# Patient Record
Sex: Male | Born: 1950
Health system: Southern US, Community
[De-identification: ages and names within clinical notes are randomized; demographics above are authoritative.]

## PROBLEM LIST (undated history)

## (undated) DIAGNOSIS — Z978 Presence of other specified devices: Secondary | ICD-10-CM

## (undated) DIAGNOSIS — I10 Essential (primary) hypertension: Secondary | ICD-10-CM

## (undated) DIAGNOSIS — I639 Cerebral infarction, unspecified: Secondary | ICD-10-CM

## (undated) DIAGNOSIS — R339 Retention of urine, unspecified: Secondary | ICD-10-CM

## (undated) DIAGNOSIS — I472 Ventricular tachycardia: Secondary | ICD-10-CM

## (undated) DIAGNOSIS — F32A Depression, unspecified: Secondary | ICD-10-CM

## (undated) DIAGNOSIS — R630 Anorexia: Secondary | ICD-10-CM

## (undated) DIAGNOSIS — I77810 Thoracic aortic ectasia: Secondary | ICD-10-CM

## (undated) DIAGNOSIS — N184 Chronic kidney disease, stage 4 (severe): Secondary | ICD-10-CM

## (undated) DIAGNOSIS — I499 Cardiac arrhythmia, unspecified: Secondary | ICD-10-CM

## (undated) DIAGNOSIS — C61 Malignant neoplasm of prostate: Secondary | ICD-10-CM

## (undated) DIAGNOSIS — Z8739 Personal history of other diseases of the musculoskeletal system and connective tissue: Secondary | ICD-10-CM

## (undated) DIAGNOSIS — B1920 Unspecified viral hepatitis C without hepatic coma: Secondary | ICD-10-CM

## (undated) DIAGNOSIS — R001 Bradycardia, unspecified: Secondary | ICD-10-CM

## (undated) DIAGNOSIS — Z960 Presence of urogenital implants: Secondary | ICD-10-CM

## (undated) DIAGNOSIS — I491 Atrial premature depolarization: Secondary | ICD-10-CM

## (undated) DIAGNOSIS — I119 Hypertensive heart disease without heart failure: Secondary | ICD-10-CM

## (undated) HISTORY — DX: Ventricular tachycardia: I47.2

## (undated) HISTORY — DX: Unspecified viral hepatitis C without hepatic coma: B19.20

## (undated) HISTORY — PX: ACHILLES TENDON REPAIR: SUR1153

## (undated) HISTORY — DX: Atrial premature depolarization: I49.1

## (undated) HISTORY — DX: Cerebral infarction, unspecified: I63.9

## (undated) HISTORY — DX: Chronic kidney disease, stage 4 (severe): N18.4

## (undated) HISTORY — DX: Essential (primary) hypertension: I10

## (undated) HISTORY — PX: CERVICAL DISC SURGERY: SHX588

## (undated) HISTORY — PX: COLONOSCOPY: SHX174

## (undated) HISTORY — DX: Hypertensive heart disease without heart failure: I11.9

## (undated) HISTORY — PX: NOSE SURGERY: SHX723

---
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
## 1995-11-27 DIAGNOSIS — C61 Malignant neoplasm of prostate: Secondary | ICD-10-CM

## 1995-11-27 HISTORY — DX: Malignant neoplasm of prostate: C61

## 2005-11-05 ENCOUNTER — Emergency Department (HOSPITAL_COMMUNITY): Admission: EM | Admit: 2005-11-05 | Discharge: 2005-11-06 | Payer: Self-pay | Admitting: Emergency Medicine

## 2006-06-09 ENCOUNTER — Emergency Department (HOSPITAL_COMMUNITY): Admission: EM | Admit: 2006-06-09 | Discharge: 2006-06-09 | Payer: Self-pay | Admitting: Emergency Medicine

## 2006-07-21 ENCOUNTER — Emergency Department (HOSPITAL_COMMUNITY): Admission: EM | Admit: 2006-07-21 | Discharge: 2006-07-21 | Payer: Self-pay | Admitting: Family Medicine

## 2006-08-02 ENCOUNTER — Emergency Department (HOSPITAL_COMMUNITY): Admission: EM | Admit: 2006-08-02 | Discharge: 2006-08-03 | Payer: Self-pay | Admitting: Emergency Medicine

## 2006-08-03 ENCOUNTER — Encounter (INDEPENDENT_AMBULATORY_CARE_PROVIDER_SITE_OTHER): Payer: Self-pay | Admitting: *Deleted

## 2006-08-03 ENCOUNTER — Ambulatory Visit (HOSPITAL_COMMUNITY): Admission: RE | Admit: 2006-08-03 | Discharge: 2006-08-03 | Payer: Self-pay | Admitting: Emergency Medicine

## 2006-09-19 ENCOUNTER — Emergency Department (HOSPITAL_COMMUNITY): Admission: EM | Admit: 2006-09-19 | Discharge: 2006-09-19 | Payer: Self-pay | Admitting: Emergency Medicine

## 2006-11-15 ENCOUNTER — Ambulatory Visit: Payer: Self-pay | Admitting: Nurse Practitioner

## 2006-11-21 ENCOUNTER — Ambulatory Visit: Payer: Self-pay | Admitting: Internal Medicine

## 2006-11-27 ENCOUNTER — Ambulatory Visit: Payer: Self-pay | Admitting: *Deleted

## 2006-11-28 ENCOUNTER — Ambulatory Visit: Payer: Self-pay | Admitting: Nurse Practitioner

## 2006-12-13 ENCOUNTER — Ambulatory Visit: Payer: Self-pay | Admitting: Nurse Practitioner

## 2006-12-27 ENCOUNTER — Ambulatory Visit: Payer: Self-pay | Admitting: Family Medicine

## 2007-01-13 ENCOUNTER — Ambulatory Visit: Payer: Self-pay | Admitting: Nurse Practitioner

## 2007-08-13 ENCOUNTER — Encounter (INDEPENDENT_AMBULATORY_CARE_PROVIDER_SITE_OTHER): Payer: Self-pay | Admitting: *Deleted

## 2007-08-21 ENCOUNTER — Ambulatory Visit: Payer: Self-pay | Admitting: Internal Medicine

## 2007-12-10 ENCOUNTER — Encounter (INDEPENDENT_AMBULATORY_CARE_PROVIDER_SITE_OTHER): Payer: Self-pay | Admitting: Nurse Practitioner

## 2007-12-10 ENCOUNTER — Ambulatory Visit: Payer: Self-pay | Admitting: Family Medicine

## 2007-12-10 LAB — CONVERTED CEMR LAB
AST: 62 units/L — ABNORMAL HIGH (ref 0–37)
Albumin: 4.2 g/dL (ref 3.5–5.2)
Alkaline Phosphatase: 61 units/L (ref 39–117)
BUN: 22 mg/dL (ref 6–23)
Basophils Relative: 0 % (ref 0–1)
Eosinophils Absolute: 0.1 10*3/uL (ref 0.0–0.7)
Eosinophils Relative: 1 % (ref 0–5)
MCHC: 34.2 g/dL (ref 30.0–36.0)
MCV: 90.3 fL (ref 78.0–100.0)
Monocytes Relative: 7 % (ref 3–12)
Neutrophils Relative %: 67 % (ref 43–77)
Platelets: 183 10*3/uL (ref 150–400)
Potassium: 3.6 meq/L (ref 3.5–5.3)
RBC: 4.95 M/uL (ref 4.22–5.81)
Total Bilirubin: 0.5 mg/dL (ref 0.3–1.2)

## 2007-12-22 ENCOUNTER — Ambulatory Visit: Payer: Self-pay | Admitting: Family Medicine

## 2008-01-05 ENCOUNTER — Ambulatory Visit: Payer: Self-pay | Admitting: Internal Medicine

## 2008-02-19 ENCOUNTER — Ambulatory Visit (HOSPITAL_COMMUNITY): Admission: RE | Admit: 2008-02-19 | Discharge: 2008-02-19 | Payer: Self-pay | Admitting: Urology

## 2008-07-30 ENCOUNTER — Emergency Department (HOSPITAL_COMMUNITY): Admission: EM | Admit: 2008-07-30 | Discharge: 2008-07-30 | Payer: Self-pay | Admitting: Family Medicine

## 2008-08-09 ENCOUNTER — Ambulatory Visit: Payer: Self-pay | Admitting: Internal Medicine

## 2008-08-21 ENCOUNTER — Emergency Department (HOSPITAL_COMMUNITY): Admission: EM | Admit: 2008-08-21 | Discharge: 2008-08-22 | Payer: Self-pay | Admitting: *Deleted

## 2008-08-23 ENCOUNTER — Emergency Department (HOSPITAL_COMMUNITY): Admission: EM | Admit: 2008-08-23 | Discharge: 2008-08-23 | Payer: Self-pay | Admitting: Family Medicine

## 2008-09-06 ENCOUNTER — Ambulatory Visit: Payer: Self-pay | Admitting: Internal Medicine

## 2008-09-06 LAB — CONVERTED CEMR LAB
AST: 74 units/L — ABNORMAL HIGH (ref 0–37)
Alkaline Phosphatase: 59 units/L (ref 39–117)
BUN: 20 mg/dL (ref 6–23)
Basophils Relative: 0 % (ref 0–1)
Creatinine, Ser: 1.31 mg/dL (ref 0.40–1.50)
Eosinophils Absolute: 0.1 10*3/uL (ref 0.0–0.7)
Eosinophils Relative: 1 % (ref 0–5)
Glucose, Bld: 86 mg/dL (ref 70–99)
HCT: 41.5 % (ref 39.0–52.0)
HDL: 54 mg/dL (ref >39)
Hemoglobin: 13.8 g/dL (ref 13.0–17.0)
LDL Cholesterol: 130 mg/dL — ABNORMAL HIGH (ref 0–99)
Lymphs Abs: 1.5 10*3/uL (ref 0.7–4.0)
MCHC: 33.3 g/dL (ref 30.0–36.0)
MCV: 91.4 fL (ref 78.0–100.0)
Monocytes Absolute: 0.7 10*3/uL (ref 0.1–1.0)
Monocytes Relative: 9 % (ref 3–12)
Neutrophils Relative %: 67 % (ref 43–77)
PSA: 14.21 ng/mL — ABNORMAL HIGH (ref 0.10–4.00)
RBC: 4.54 M/uL (ref 4.22–5.81)
Total Bilirubin: 0.6 mg/dL (ref 0.3–1.2)
Total CHOL/HDL Ratio: 3.6
Triglycerides: 60 mg/dL (ref <150)

## 2009-03-21 ENCOUNTER — Ambulatory Visit: Payer: Self-pay | Admitting: Family Medicine

## 2009-07-20 ENCOUNTER — Ambulatory Visit: Payer: Self-pay | Admitting: Internal Medicine

## 2009-07-20 ENCOUNTER — Encounter (INDEPENDENT_AMBULATORY_CARE_PROVIDER_SITE_OTHER): Payer: Self-pay | Admitting: Adult Health

## 2009-07-20 LAB — CONVERTED CEMR LAB
BUN: 30 mg/dL — ABNORMAL HIGH (ref 6–23)
Basophils Relative: 0 % (ref 0–1)
CO2: 29 meq/L (ref 19–32)
Calcium: 9.4 mg/dL (ref 8.4–10.5)
Chloride: 99 meq/L (ref 96–112)
Creatinine, Ser: 1.62 mg/dL — ABNORMAL HIGH (ref 0.40–1.50)
Eosinophils Absolute: 0.1 10*3/uL (ref 0.0–0.7)
Eosinophils Relative: 2 % (ref 0–5)
HCT: 44.1 % (ref 39.0–52.0)
Lymphs Abs: 2.1 10*3/uL (ref 0.7–4.0)
MCHC: 34 g/dL (ref 30.0–36.0)
MCV: 90 fL (ref 78.0–100.0)
Monocytes Relative: 12 % (ref 3–12)
Neutrophils Relative %: 56 % (ref 43–77)
Platelets: 179 10*3/uL (ref 150–400)
RBC: 4.9 M/uL (ref 4.22–5.81)
Total Bilirubin: 0.8 mg/dL (ref 0.3–1.2)
WBC: 6.9 10*3/uL (ref 4.0–10.5)

## 2009-08-23 ENCOUNTER — Ambulatory Visit: Payer: Self-pay | Admitting: Internal Medicine

## 2009-09-14 ENCOUNTER — Ambulatory Visit: Payer: Self-pay | Admitting: Family Medicine

## 2009-11-29 ENCOUNTER — Ambulatory Visit: Payer: Self-pay | Admitting: Internal Medicine

## 2009-11-29 ENCOUNTER — Encounter (INDEPENDENT_AMBULATORY_CARE_PROVIDER_SITE_OTHER): Payer: Self-pay | Admitting: Adult Health

## 2009-11-29 LAB — CONVERTED CEMR LAB
ALT: 67 units/L — ABNORMAL HIGH (ref 0–53)
Albumin: 4.1 g/dL (ref 3.5–5.2)
CO2: 27 meq/L (ref 19–32)
Calcium: 9.5 mg/dL (ref 8.4–10.5)
Chloride: 99 meq/L (ref 96–112)
Cholesterol: 179 mg/dL (ref 0–200)
Glucose, Bld: 101 mg/dL — ABNORMAL HIGH (ref 70–99)
Potassium: 3.5 meq/L (ref 3.5–5.3)
Pro B Natriuretic peptide (BNP): 8.2 pg/mL (ref 0.0–100.0)
Sodium: 137 meq/L (ref 135–145)
Total Protein: 8 g/dL (ref 6.0–8.3)
Triglycerides: 51 mg/dL (ref <150)
VLDL: 10 mg/dL (ref 0–40)

## 2009-12-01 ENCOUNTER — Encounter (INDEPENDENT_AMBULATORY_CARE_PROVIDER_SITE_OTHER): Payer: Self-pay | Admitting: Adult Health

## 2009-12-01 LAB — CONVERTED CEMR LAB
Hep A IgM: NEGATIVE
Hep B C IgM: NEGATIVE
Hepatitis B Surface Ag: NEGATIVE

## 2010-05-24 ENCOUNTER — Encounter (INDEPENDENT_AMBULATORY_CARE_PROVIDER_SITE_OTHER): Payer: Self-pay | Admitting: Adult Health

## 2010-05-24 ENCOUNTER — Ambulatory Visit: Payer: Self-pay | Admitting: Family Medicine

## 2010-05-24 LAB — CONVERTED CEMR LAB
ALT: 67 units/L — ABNORMAL HIGH (ref 0–53)
AST: 76 units/L — ABNORMAL HIGH (ref 0–37)
Albumin: 4.4 g/dL (ref 3.5–5.2)
Barbiturate Quant, Ur: NEGATIVE
Basophils Relative: 1 % (ref 0–1)
CO2: 28 meq/L (ref 19–32)
Calcium: 9.9 mg/dL (ref 8.4–10.5)
Chloride: 101 meq/L (ref 96–112)
Eosinophils Relative: 2 % (ref 0–5)
HCT: 44.4 % (ref 39.0–52.0)
Hemoglobin: 14.2 g/dL (ref 13.0–17.0)
Hep A Total Ab: NEGATIVE
INR: 1.13 (ref <1.50)
MCHC: 32 g/dL (ref 30.0–36.0)
Marijuana Metabolite: NEGATIVE
Methadone: NEGATIVE
Monocytes Absolute: 0.7 10*3/uL (ref 0.1–1.0)
Monocytes Relative: 10 % (ref 3–12)
Neutro Abs: 4.3 10*3/uL (ref 1.7–7.7)
Opiate Screen, Urine: NEGATIVE
Potassium: 4 meq/L (ref 3.5–5.3)
Propoxyphene: NEGATIVE
Prothrombin Time: 14.4 s (ref 11.6–15.2)
RBC: 4.69 M/uL (ref 4.22–5.81)

## 2010-05-30 ENCOUNTER — Ambulatory Visit (HOSPITAL_COMMUNITY): Admission: RE | Admit: 2010-05-30 | Discharge: 2010-05-30 | Payer: Self-pay | Admitting: Internal Medicine

## 2010-07-24 ENCOUNTER — Ambulatory Visit: Payer: Self-pay | Admitting: Internal Medicine

## 2011-04-05 ENCOUNTER — Emergency Department (HOSPITAL_COMMUNITY): Payer: PRIVATE HEALTH INSURANCE

## 2011-04-05 ENCOUNTER — Emergency Department (HOSPITAL_COMMUNITY)
Admission: EM | Admit: 2011-04-05 | Discharge: 2011-04-05 | Disposition: A | Discharge status: Home / Self Care | Payer: PRIVATE HEALTH INSURANCE | Attending: Emergency Medicine | Admitting: Emergency Medicine

## 2011-04-05 DIAGNOSIS — Z8546 Personal history of malignant neoplasm of prostate: Secondary | ICD-10-CM | POA: Insufficient documentation

## 2011-04-05 DIAGNOSIS — N509 Disorder of male genital organs, unspecified: Secondary | ICD-10-CM | POA: Insufficient documentation

## 2011-04-05 DIAGNOSIS — N433 Hydrocele, unspecified: Secondary | ICD-10-CM | POA: Insufficient documentation

## 2011-04-05 DIAGNOSIS — I10 Essential (primary) hypertension: Secondary | ICD-10-CM | POA: Insufficient documentation

## 2011-04-05 LAB — URINALYSIS, ROUTINE W REFLEX MICROSCOPIC
Bilirubin Urine: NEGATIVE
Glucose, UA: NEGATIVE mg/dL
Hgb urine dipstick: NEGATIVE
Ketones, ur: NEGATIVE mg/dL
pH: 7 (ref 5.0–8.0)

## 2011-06-26 ENCOUNTER — Encounter (INDEPENDENT_AMBULATORY_CARE_PROVIDER_SITE_OTHER): Payer: Self-pay | Admitting: General Surgery

## 2011-07-02 ENCOUNTER — Inpatient Hospital Stay (HOSPITAL_COMMUNITY)
Admission: EM | Admit: 2011-07-02 | Discharge: 2011-07-04 | DRG: 287 | Disposition: A | Discharge status: Home / Self Care | Payer: Managed Care, Other (non HMO) | Source: Ambulatory Visit | Attending: Cardiovascular Disease | Admitting: Cardiovascular Disease

## 2011-07-02 ENCOUNTER — Emergency Department (HOSPITAL_COMMUNITY)
Admission: EM | Admit: 2011-07-02 | Discharge: 2011-07-02 | Disposition: A | Discharge status: Other Acute Inpatient Hospital | Payer: Managed Care, Other (non HMO) | Source: Home / Self Care | Attending: Emergency Medicine | Admitting: Emergency Medicine

## 2011-07-02 ENCOUNTER — Emergency Department (HOSPITAL_COMMUNITY): Payer: Managed Care, Other (non HMO)

## 2011-07-02 DIAGNOSIS — Z8546 Personal history of malignant neoplasm of prostate: Secondary | ICD-10-CM

## 2011-07-02 DIAGNOSIS — I2510 Atherosclerotic heart disease of native coronary artery without angina pectoris: Secondary | ICD-10-CM | POA: Diagnosis present

## 2011-07-02 DIAGNOSIS — R0789 Other chest pain: Principal | ICD-10-CM | POA: Diagnosis present

## 2011-07-02 DIAGNOSIS — I10 Essential (primary) hypertension: Secondary | ICD-10-CM | POA: Diagnosis present

## 2011-07-02 LAB — CK TOTAL AND CKMB (NOT AT ARMC)
CK, MB: 1.9 ng/mL (ref 0.3–4.0)
Relative Index: 1.2 (ref 0.0–2.5)
Total CK: 155 U/L (ref 7–232)

## 2011-07-02 LAB — DIFFERENTIAL
Eosinophils Relative: 0 % (ref 0–5)
Lymphocytes Relative: 5 % — ABNORMAL LOW (ref 12–46)
Lymphs Abs: 0.6 10*3/uL — ABNORMAL LOW (ref 0.7–4.0)
Monocytes Absolute: 0.7 10*3/uL (ref 0.1–1.0)

## 2011-07-02 LAB — COMPREHENSIVE METABOLIC PANEL
ALT: 54 U/L — ABNORMAL HIGH (ref 0–53)
AST: 61 U/L — ABNORMAL HIGH (ref 0–37)
Albumin: 3.6 g/dL (ref 3.5–5.2)
Alkaline Phosphatase: 65 U/L (ref 39–117)
BUN: 21 mg/dL (ref 6–23)
CO2: 29 mEq/L (ref 19–32)
Calcium: 9.6 mg/dL (ref 8.4–10.5)
Chloride: 98 mEq/L (ref 96–112)
Creatinine, Ser: 1.2 mg/dL (ref 0.50–1.35)
GFR calc Af Amer: >60 mL/min (ref >60)
GFR calc non Af Amer: >60 mL/min (ref >60)
Glucose, Bld: 147 mg/dL — ABNORMAL HIGH (ref 70–99)
Potassium: 2.8 mEq/L — ABNORMAL LOW (ref 3.5–5.1)
Sodium: 136 mEq/L (ref 135–145)
Total Bilirubin: 0.5 mg/dL (ref 0.3–1.2)
Total Protein: 8.5 g/dL — ABNORMAL HIGH (ref 6.0–8.3)

## 2011-07-02 LAB — CBC
HCT: 40.2 % (ref 39.0–52.0)
MCV: 87.6 fL (ref 78.0–100.0)
RDW: 12.7 % (ref 11.5–15.5)
WBC: 11.1 10*3/uL — ABNORMAL HIGH (ref 4.0–10.5)

## 2011-07-02 LAB — CARDIAC PANEL(CRET KIN+CKTOT+MB+TROPI)
CK, MB: 2.2 ng/mL (ref 0.3–4.0)
Relative Index: 1.8 (ref 0.0–2.5)
Total CK: 123 U/L (ref 7–232)
Troponin I: <0.3 ng/mL (ref <0.30)

## 2011-07-02 LAB — PROTIME-INR: INR: 1.02 (ref 0.00–1.49)

## 2011-07-02 LAB — POCT ACTIVATED CLOTTING TIME: Activated Clotting Time: 155 seconds

## 2011-07-02 LAB — LIPASE, BLOOD: Lipase: 55 U/L (ref 11–59)

## 2011-07-03 LAB — CBC
HCT: 42.7 % (ref 39.0–52.0)
MCHC: 34.9 g/dL (ref 30.0–36.0)
Platelets: 162 10*3/uL (ref 150–400)
RDW: 12.9 % (ref 11.5–15.5)

## 2011-07-03 LAB — BASIC METABOLIC PANEL
BUN: 12 mg/dL (ref 6–23)
Creatinine, Ser: 1.07 mg/dL (ref 0.50–1.35)
GFR calc Af Amer: >60 mL/min (ref >60)
GFR calc non Af Amer: >60 mL/min (ref >60)
Potassium: 2.8 mEq/L — ABNORMAL LOW (ref 3.5–5.1)

## 2011-07-03 LAB — LIPID PANEL
Cholesterol: 206 mg/dL — ABNORMAL HIGH (ref 0–200)
HDL: 68 mg/dL (ref >39)

## 2011-07-04 LAB — CBC
HCT: 42.8 % (ref 39.0–52.0)
MCV: 88.2 fL (ref 78.0–100.0)
RBC: 4.85 MIL/uL (ref 4.22–5.81)
WBC: 8 10*3/uL (ref 4.0–10.5)

## 2011-07-04 LAB — BASIC METABOLIC PANEL
BUN: 17 mg/dL (ref 6–23)
CO2: 27 mEq/L (ref 19–32)
Chloride: 103 mEq/L (ref 96–112)
Creatinine, Ser: 1.16 mg/dL (ref 0.50–1.35)

## 2011-07-05 NOTE — Discharge Summary (Signed)
  NAMELAUREN, Casey Greene NO.:  [id number]  MEDICAL RECORD NO.:  [id number]  LOCATION:  [id number]                         FACILITY:  [facility]  PHYSICIAN:  Birdie Riddle, M.D.  DATE OF BIRTH:  1951/10/27  DATE OF ADMISSION:  07/02/2011 DATE OF DISCHARGE:  07/04/2011                              DISCHARGE SUMMARY   FINAL DIAGNOSES: 1. Chest pain. 2. Mild multivessel native vessel coronary artery disease. 3. Hypertension. 4. Status post prostate cancer.  DISCHARGE MEDICATIONS: 1. Ibuprofen 400 mg 3 times daily for 3 days. 2. Crestor 10 mg 1 daily. 3. Amlodipine 5 mg daily. 4. Tamsulosin 0.4 mg 1 at bedtime.  DISCHARGE DIET:  Low-sodium, heart-healthy diet.  DISCHARGE ACTIVITY:  The patient is to increase activity slowly as tolerated.  WOUND CARE INSTRUCTIONS:  The patient to notify right groin pain, swelling, or discharge.  FOLLOWUP:  By Dr. Dixie Dials in 2 weeks.  The patient to call [phone] for appointment. CONDITION ON DISCHARGE:  Improved.  HISTORY:  This 60 year old black male presented with 5 hours of chest pain increased with breathing without fever, cough, or cold.  PHYSICAL EXAMINATION:  VITAL SIGNS:  Temperature 98.2, pulse 97, respirations 18, and blood pressure 153/92. GENERAL:  The patient is a well-built, well-nourished black male in mild distress. HEENT:  The patient is normocephalic, atraumatic with brown eyes. Conjunctivae pink.  Sclerae nonicteric. NECK:  No JVD. LUNGS:  Clear bilaterally. HEART:  Normal S1-S2. ABDOMEN:  Soft and nontender. EXTREMITIES:  Trace edema. SKIN:  Warm and dry. NEUROLOGIC:  Alert, oriented x3 and moves all 4 extremities.  LABORATORY DATA:  Normal hemoglobin/hematocrit.  WBC count borderline at 11,000 and platelet count 166,000.  Electrolytes normal except potassium low at 2.8.  After significant replacement, the potassium was up to 3.7. BUN and creatinine normal.  Glucose borderline at 147.   Subsequent glucose was down to 120.  Cholesterol level was elevated at 206 with LDL cholesterol of 128, HDL cholesterol of 68, and triglycerides 49. Cardiac enzymes negative x2.    Cardiac catheterization showed mild native vessel, multivessel coronary  artery disease.  2-D echocardiogram showed moderate left ventricular hypertrophy with a vigorous LV systolic function.  HOSPITAL COURSE:  The patient was admitted to telemetry unit.  He underwent cardiac catheterization that showed mild coronary artery disease.  He had a potassium replacement over 48 hours with normalization of low potassium level.  This was created by the patient taking some bowel cleansing agent 1 week ago.  Overall, he felt better with Motrin use and he was discharged home in a satisfactory condition with a followup by me in 2 weeks.     Birdie Riddle, M.D.     ASK/MEDQ  D:  07/04/2011  T:  07/04/2011  Job:  [id number]  Electronically Signed by Dixie Dials M.D. on 07/05/2011 06:02:05 PM

## 2011-07-26 ENCOUNTER — Encounter (INDEPENDENT_AMBULATORY_CARE_PROVIDER_SITE_OTHER): Payer: Self-pay | Admitting: General Surgery

## 2011-07-26 NOTE — Progress Notes (Signed)
Addended by: Lanny Hurst on: 07/26/2011 02:15 PM   Modules accepted: Orders

## 2011-08-27 LAB — POCT I-STAT, CHEM 8
BUN: 25 — ABNORMAL HIGH
Calcium, Ion: 1.06 — ABNORMAL LOW
Creatinine, Ser: 1.5
Glucose, Bld: 129 — ABNORMAL HIGH
TCO2: 27

## 2012-11-08 ENCOUNTER — Emergency Department (HOSPITAL_COMMUNITY)
Admission: EM | Admit: 2012-11-08 | Discharge: 2012-11-08 | Disposition: A | Discharge status: Home / Self Care | Payer: Managed Care, Other (non HMO) | Attending: Emergency Medicine | Admitting: Emergency Medicine

## 2012-11-08 ENCOUNTER — Encounter (HOSPITAL_COMMUNITY): Payer: Self-pay | Admitting: Emergency Medicine

## 2012-11-08 DIAGNOSIS — C61 Malignant neoplasm of prostate: Secondary | ICD-10-CM | POA: Insufficient documentation

## 2012-11-08 DIAGNOSIS — K5900 Constipation, unspecified: Secondary | ICD-10-CM | POA: Insufficient documentation

## 2012-11-08 DIAGNOSIS — Z87891 Personal history of nicotine dependence: Secondary | ICD-10-CM | POA: Insufficient documentation

## 2012-11-08 DIAGNOSIS — R338 Other retention of urine: Secondary | ICD-10-CM

## 2012-11-08 LAB — URINALYSIS, ROUTINE W REFLEX MICROSCOPIC
Glucose, UA: NEGATIVE mg/dL
Hgb urine dipstick: NEGATIVE
Ketones, ur: NEGATIVE mg/dL
Protein, ur: 30 mg/dL — AB

## 2012-11-08 LAB — URINE MICROSCOPIC-ADD ON

## 2012-11-08 MED ORDER — LIDOCAINE HCL 2 % EX GEL
CUTANEOUS | Status: AC
  Administered 2012-11-08: 10
  Filled 2012-11-08: qty 10

## 2012-11-08 NOTE — ED Notes (Signed)
Harvie Heck, PA notified of pt vitals

## 2012-11-08 NOTE — ED Provider Notes (Signed)
Medical screening examination/treatment/procedure(s) were performed by non-physician practitioner and as supervising physician I was immediately available for consultation/collaboration.  Threasa Beards, MD 11/08/12 [phone]

## 2012-11-08 NOTE — ED Notes (Signed)
Foley cath bag switched to a leg bag.

## 2012-11-08 NOTE — ED Notes (Signed)
Pt presents w/ inability to empty bladder and reported constipation. Last normal BM was 1300 yesterday. Last voided at 1300 yesterday as well, had urge to void at 2200 lst p.m. But was unable to empty bladder.

## 2012-11-08 NOTE — ED Notes (Signed)
Pt reports "felling much better" after catheter placement. Pt wife at bedside. Pt resting comfortably in NAD

## 2012-11-08 NOTE — ED Provider Notes (Signed)
History     CSN: [id number]  Arrival date & time 11/08/12  0716   None     Chief Complaint  Patient presents with  . Urinary Retention  . Constipation    (Consider location/radiation/quality/duration/timing/severity/associated sxs/prior treatment) HPI The patient presents to the ED with urinary retention since yesterday.  Last void occurred at 1300 yesterday.  Reports a history of Prostate cancer in 1997, and reports "not liking any treatment options" and opted for herbal treatments.  He was unable to list the herbal supplements regimen that he is currently on but reports he did not take any supplements for 1 week.  Denies change in medication or a recent anesthesia.  He reports chronic decrease in stream without a noticeable change yesterday. Denies scrotal swelling, numbness or tingling, hematuria, dysuria. He also complains of constipation since yesterday.  Reports last bowel movement yesterday around 1430.  Attributes the constipation episode to the consumption of Macaroni and cheese.  He has had mild generalized abdominal cramping. Denies abdominal surgery. He denies fever, chills, nausea, vomiting, hematochezia, melena, back pain.  Past Medical History  Diagnosis Date  . Hypertension   . Cancer 1997    prostate    History reviewed. No pertinent past surgical history.  Family History  Problem Relation Age of Onset  . Heart disease Mother   . Obesity Mother   . Heart disease Father   . Diabetes Father   . ALS Sister     History  Substance Use Topics  . Smoking status: Former Research scientist (life sciences)  . Smokeless tobacco: Never Used  . Alcohol Use: No      Review of Systems  Allergies  Review of patient's allergies indicates no known allergies.  Home Medications  No current outpatient prescriptions on file.  BP 191/103  Pulse 106  Temp 97.7 F (36.5 C) (Oral)  Resp 18  SpO2 99%  Physical Exam  Nursing note and vitals reviewed. Constitutional: He appears  well-developed and well-nourished.       Patient appears uncomfortable.   Neck: Neck supple.  Cardiovascular: Normal rate, regular rhythm, S1 normal, S2 normal and normal heart sounds.   Pulmonary/Chest: Effort normal and breath sounds normal. He has no wheezes. He has no rales.  Abdominal: Bowel sounds are normal. There is tenderness in the suprapubic area. There is rigidity. There is no CVA tenderness.  Neurological: He is alert.  Skin: Skin is warm and dry.    ED Course  Procedures (including critical care time)  Labs Reviewed  URINALYSIS, ROUTINE W REFLEX MICROSCOPIC - Abnormal; Notable for the following:    Protein, ur 30 (*)     All other components within normal limits  URINE MICROSCOPIC-ADD ON - Abnormal; Notable for the following:    Squamous Epithelial / LPF FEW (*)     All other components within normal limits    The patient reports significant relief after foley cath was placed.  MDM  Patient to be referred to Urology. He has prostate cancer and this may be the cause of his bladder dysfunction. Told to return here as needed. Foley left in place.      Brent General, [name] 11/08/12 1502

## 2012-11-13 ENCOUNTER — Emergency Department (HOSPITAL_COMMUNITY)
Admission: EM | Admit: 2012-11-13 | Discharge: 2012-11-13 | Disposition: A | Discharge status: Home / Self Care | Payer: Managed Care, Other (non HMO) | Attending: Emergency Medicine | Admitting: Emergency Medicine

## 2012-11-13 ENCOUNTER — Encounter (HOSPITAL_COMMUNITY): Payer: Self-pay | Admitting: Emergency Medicine

## 2012-11-13 DIAGNOSIS — Z8546 Personal history of malignant neoplasm of prostate: Secondary | ICD-10-CM | POA: Insufficient documentation

## 2012-11-13 DIAGNOSIS — Z87891 Personal history of nicotine dependence: Secondary | ICD-10-CM | POA: Insufficient documentation

## 2012-11-13 DIAGNOSIS — N390 Urinary tract infection, site not specified: Secondary | ICD-10-CM | POA: Insufficient documentation

## 2012-11-13 DIAGNOSIS — R339 Retention of urine, unspecified: Secondary | ICD-10-CM

## 2012-11-13 DIAGNOSIS — Z79899 Other long term (current) drug therapy: Secondary | ICD-10-CM | POA: Insufficient documentation

## 2012-11-13 DIAGNOSIS — I10 Essential (primary) hypertension: Secondary | ICD-10-CM | POA: Insufficient documentation

## 2012-11-13 LAB — URINE MICROSCOPIC-ADD ON

## 2012-11-13 LAB — URINALYSIS, ROUTINE W REFLEX MICROSCOPIC
Bilirubin Urine: NEGATIVE
Nitrite: NEGATIVE
Specific Gravity, Urine: 1.016 (ref 1.005–1.030)
pH: 6.5 (ref 5.0–8.0)

## 2012-11-13 MED ORDER — CEPHALEXIN 500 MG PO CAPS
500.0000 mg | ORAL_CAPSULE | Freq: Once | ORAL | Status: AC
  Administered 2012-11-13: 500 mg via ORAL
  Filled 2012-11-13: qty 1

## 2012-11-13 MED ORDER — CEPHALEXIN 250 MG PO CAPS: 500.0000 mg | ORAL_CAPSULE | Freq: Four times a day (QID) | ORAL | Status: DC

## 2012-11-13 MED ORDER — CEPHALEXIN 500 MG PO CAPS: 500.0000 mg | ORAL_CAPSULE | Freq: Four times a day (QID) | ORAL | Status: DC

## 2012-11-13 MED ORDER — ACETAMINOPHEN 325 MG PO TABS
ORAL_TABLET | ORAL | Status: AC
  Filled 2012-11-13: qty 2

## 2012-11-13 MED ORDER — ACETAMINOPHEN 325 MG PO TABS
650.0000 mg | ORAL_TABLET | Freq: Once | ORAL | Status: AC
  Administered 2012-11-13: 650 mg via ORAL

## 2012-11-13 NOTE — ED Provider Notes (Signed)
History     CSN: [id number]  Arrival date & time 11/13/12  1900   First MD Initiated Contact with Patient 11/13/12 2029      Chief Complaint  Patient presents with  . Urinary Retention    (Consider location/radiation/quality/duration/timing/severity/associated sxs/prior treatment) HPI History provided by pt.  Pt was seen in ED on 11/08/12 w/ acute urinary retention.  Discharged home w/ foley and leg bag as well as referral to urology.  Foley removed by urologist today and return appointment w/ [place] scheduled for tomorrow.  Pt reports that he was able to urinate at office but urinary retention returned when he got home. Only able to void small amounts at a time.  Associated w/ lower abdominal pressure.  Denies fever and N/V.   Past Medical History  Diagnosis Date  . Hypertension   . Cancer 1997    prostate    History reviewed. No pertinent past surgical history.  Family History  Problem Relation Age of Onset  . Heart disease Mother   . Obesity Mother   . Heart disease Father   . Diabetes Father   . ALS Sister     History  Substance Use Topics  . Smoking status: Former Research scientist (life sciences)  . Smokeless tobacco: Never Used  . Alcohol Use: No      Review of Systems  All other systems reviewed and are negative.    Allergies  Review of patient's allergies indicates no known allergies.  Home Medications   Current Outpatient Rx  Name  Route  Sig  Dispense  Refill  . DOXAZOSIN MESYLATE 4 MG PO TABS   Oral   Take 4 mg by mouth at bedtime.         . IBUPROFEN 200 MG PO TABS   Oral   Take 200 mg by mouth every 6 (six) hours as needed. Pain         . LOSARTAN POTASSIUM 50 MG PO TABS   Oral   Take 50 mg by mouth daily.         Marland Kitchen POTASSIUM CHLORIDE CRYS ER 20 MEQ PO TBCR   Oral   Take 20 mEq by mouth daily.         . CEPHALEXIN 250 MG PO CAPS   Oral   Take 2 capsules (500 mg total) by mouth 4 (four) times daily.   28 capsule   0     BP 146/90  Pulse 111   Temp 97.9 F (36.6 C) (Oral)  Resp 20  SpO2 100%  Physical Exam  Nursing note and vitals reviewed. Constitutional: He is oriented to person, place, and time. He appears well-developed and well-nourished. No distress.  HENT:  Head: Normocephalic and atraumatic.  Eyes:       Normal appearance  Neck: Normal range of motion.  Cardiovascular: Normal rate and regular rhythm.   Pulmonary/Chest: Effort normal and breath sounds normal. No respiratory distress.  Abdominal: Soft. Bowel sounds are normal. He exhibits no distension and no mass. There is no tenderness. There is no rebound and no guarding.  Genitourinary:       No CVA tenderness  Musculoskeletal: Normal range of motion.  Neurological: He is alert and oriented to person, place, and time.  Skin: Skin is warm and dry. No rash noted.  Psychiatric: He has a normal mood and affect. His behavior is normal.    ED Course  Procedures (including critical care time)  Labs Reviewed  URINALYSIS, [place] -  Abnormal; Notable for the following:    APPearance TURBID (*)     Hgb urine dipstick LARGE (*)     Protein, ur 30 (*)     Leukocytes, UA LARGE (*)     All other components within normal limits  URINE MICROSCOPIC-ADD ON - Abnormal; Notable for the following:    Squamous Epithelial / LPF FEW (*)     Bacteria, UA FEW (*)     All other components within normal limits  URINE CULTURE   No results found.   1. Urinary retention   2. UTI (lower urinary tract infection)       MDM  61yo M presents w/ urinary retention.  Seen for same on 12/14, d/c'd home w/ foley, followed up with urology today, foley removed, only able to void small amts w/ associated abdominal pressure ever since.  Bladder scan performed prior to inserting foley in ED this evening, showed 523mL urine.  Abd pressure resolved following foley insertion and abd benign on exam.  U/A positive for infection.   Results discussed w/ pt.  No s/sx concerning  for pyelo.  Pt prescribed keflex and will go home w/ foley.  He has a f/u appt w/ [place] scheduled with urology tomorrow.  Urine has been sent for culture.  9:30 PM   Pt tachycardic at time of discharge.  On re-examination, he has dry mucous membranes and slightly sluggish cap refill.  He drank multiple cups of water and cranberry juice and HR currently 76.  D/c'd home.  10:46 PM        Remer Macho, [name] 11/13/12 2246

## 2012-11-13 NOTE — ED Provider Notes (Signed)
Medical screening examination/treatment/procedure(s) were performed by non-physician practitioner and as supervising physician I was immediately available for consultation/collaboration.  Jasper Riling. Alvino Chapel, MD 11/13/12 2356

## 2012-11-13 NOTE — ED Notes (Signed)
Leg bag placed on patient.

## 2012-11-13 NOTE — ED Notes (Signed)
Patient has prostate CA and had a catheter placed on Saturday for urinary retention.  Patient had catheter removed this morning around 10 am and has not been able to void since.

## 2012-11-13 NOTE — ED Notes (Signed)
Foley cath 14 French placed to drain bladder.  Urine draining into bag. Cloudy.  Sent for UA.

## 2012-11-15 LAB — URINE CULTURE

## 2013-01-26 ENCOUNTER — Emergency Department (HOSPITAL_COMMUNITY)
Admission: EM | Admit: 2013-01-26 | Discharge: 2013-01-26 | Disposition: A | Discharge status: Home / Self Care | Payer: No Typology Code available for payment source | Attending: Emergency Medicine | Admitting: Emergency Medicine

## 2013-01-26 ENCOUNTER — Encounter (HOSPITAL_COMMUNITY): Payer: Self-pay | Admitting: Emergency Medicine

## 2013-01-26 DIAGNOSIS — Z79899 Other long term (current) drug therapy: Secondary | ICD-10-CM | POA: Insufficient documentation

## 2013-01-26 DIAGNOSIS — R350 Frequency of micturition: Secondary | ICD-10-CM | POA: Insufficient documentation

## 2013-01-26 DIAGNOSIS — I10 Essential (primary) hypertension: Secondary | ICD-10-CM | POA: Insufficient documentation

## 2013-01-26 DIAGNOSIS — R109 Unspecified abdominal pain: Secondary | ICD-10-CM | POA: Insufficient documentation

## 2013-01-26 DIAGNOSIS — Z87891 Personal history of nicotine dependence: Secondary | ICD-10-CM | POA: Insufficient documentation

## 2013-01-26 DIAGNOSIS — R339 Retention of urine, unspecified: Secondary | ICD-10-CM | POA: Insufficient documentation

## 2013-01-26 DIAGNOSIS — R338 Other retention of urine: Secondary | ICD-10-CM

## 2013-01-26 DIAGNOSIS — C801 Malignant (primary) neoplasm, unspecified: Secondary | ICD-10-CM | POA: Insufficient documentation

## 2013-01-26 LAB — URINALYSIS, MICROSCOPIC ONLY
Nitrite: NEGATIVE
Specific Gravity, Urine: 1.008 (ref 1.005–1.030)
Urobilinogen, UA: 0.2 mg/dL (ref 0.0–1.0)

## 2013-01-26 NOTE — ED Provider Notes (Addendum)
History     CSN: [id number]  Arrival date & time 01/26/13  1511   First MD Initiated Contact with Patient 01/26/13 1556      Chief Complaint  Patient presents with  . Dysuria    (Consider location/radiation/quality/duration/timing/severity/associated sxs/prior treatment) Patient is a 62 y.o. male presenting with frequency. The history is provided by the patient.  Urinary Frequency This is a recurrent (Had a urinary catheter removed today at 9 AM and has been unable to urinate since) problem. The current episode started 6 to 12 hours ago. The problem occurs constantly. The problem has been rapidly worsening. Associated symptoms include abdominal pain. Pertinent negatives include no chest pain, no headaches and no shortness of breath. Exacerbated by: When trying to urinate. Nothing relieves the symptoms. He has tried nothing for the symptoms. The treatment provided no relief.    Past Medical History  Diagnosis Date  . Hypertension   . Cancer 1997    prostate    History reviewed. No pertinent past surgical history.  Family History  Problem Relation Age of Onset  . Heart disease Mother   . Obesity Mother   . Heart disease Father   . Diabetes Father   . ALS Sister     History  Substance Use Topics  . Smoking status: Former Research scientist (life sciences)  . Smokeless tobacco: Never Used  . Alcohol Use: No      Review of Systems  Respiratory: Negative for shortness of breath.   Cardiovascular: Negative for chest pain.  Gastrointestinal: Positive for abdominal pain.  Genitourinary: Positive for frequency.  Neurological: Negative for headaches.  All other systems reviewed and are negative.    Allergies  Review of patient's allergies indicates no known allergies.  Home Medications   Current Outpatient Rx  Name  Route  Sig  Dispense  Refill  . cephALEXin (KEFLEX) 500 MG capsule   Oral   Take 1 capsule (500 mg total) by mouth 4 (four) times daily.   28 capsule   0   . doxazosin  (CARDURA) 4 MG tablet   Oral   Take 4 mg by mouth daily.          Marland Kitchen losartan (COZAAR) 50 MG tablet   Oral   Take 50 mg by mouth daily.           BP 168/119  Pulse 99  Temp(Src) 97.9 F (36.6 C) (Oral)  Resp 19  SpO2 100%  Physical Exam  Nursing note and vitals reviewed. Constitutional: He is oriented to person, place, and time. He appears well-developed and well-nourished. He appears distressed.  Pacing the room and appearing uncomfortable  HENT:  Head: Normocephalic and atraumatic.  Mouth/Throat: Oropharynx is clear and moist.  Eyes: Conjunctivae and EOM are normal. Pupils are equal, round, and reactive to light.  Neck: Normal range of motion. Neck supple.  Cardiovascular: Normal rate, regular rhythm and intact distal pulses.   No murmur heard. Pulmonary/Chest: Effort normal and breath sounds normal. No respiratory distress. He has no wheezes. He has no rales.  Abdominal: Soft. He exhibits no distension. There is tenderness in the suprapubic area. There is no rebound and no guarding.  Musculoskeletal: Normal range of motion. He exhibits no edema and no tenderness.  Neurological: He is alert and oriented to person, place, and time.  Skin: Skin is warm and dry. No rash noted. No erythema.  Psychiatric: He has a normal mood and affect. His behavior is normal.    ED Course  Procedures (  including critical care time)  Labs Reviewed  URINALYSIS, MICROSCOPIC ONLY - Abnormal; Notable for the following:    APPearance CLOUDY (*)    Hgb urine dipstick LARGE (*)    Leukocytes, UA LARGE (*)    Bacteria, UA FEW (*)    All other components within normal limits  URINE CULTURE   No results found.   1. Acute urinary retention       MDM   Patient presenting due to 2 urinary retention. He states he had his catheter removed at the urologist today at 9 AM. Since that time he has not been able to void. This is been an ongoing issue and he states he went back to the urology  office and everybody had left due to weather. Patient is uncomfortable and is having pressure in his abdomen but no other symptoms. Bedside ultrasound shows a large distended bladder. Foley catheter placed. He states that he retention has been ongoing since December and is felt to be do to prostate enlargement and UTIs  4:24 PM Catheter placed and draining clear yellow urine. Patient is feeling much better and requests to be discharged because he needs to go to work  4:42 PM UA came back with possible infection however just had foley removed.  Will get culture as pt has been on multiple antibiotics.    Blanchie Dessert, MD 01/26/13 1624  Blanchie Dessert, MD 01/26/13 1625  Blanchie Dessert, MD 01/26/13 [phone]

## 2013-01-26 NOTE — ED Notes (Signed)
States that he was at his urologist today to have a catheter removed. States that he was told to return if unable to void. States that he may have voided 1/2 teaspoon since 0900 at catheter removal

## 2013-01-26 NOTE — ED Notes (Signed)
Pt reports urinary retention today.  Had his indwelling foley catheter removed today after having it for 2 months.  Pt reports he's been drinking water all day but is unable to urinate.  Pt denies any pain at this time.  Pt's urine appears clear and yellow.

## 2013-01-29 LAB — URINE CULTURE

## 2013-01-30 NOTE — ED Notes (Signed)
+   Urine Patient ton Keflex-sensitive to same-chart appended per protocol MD.

## 2013-02-27 ENCOUNTER — Encounter (HOSPITAL_COMMUNITY): Payer: Self-pay | Admitting: *Deleted

## 2013-02-27 ENCOUNTER — Emergency Department (HOSPITAL_COMMUNITY)
Admission: EM | Admit: 2013-02-27 | Discharge: 2013-02-27 | Disposition: A | Discharge status: Home / Self Care | Payer: BC Managed Care – PPO | Attending: Emergency Medicine | Admitting: Emergency Medicine

## 2013-02-27 DIAGNOSIS — I10 Essential (primary) hypertension: Secondary | ICD-10-CM | POA: Insufficient documentation

## 2013-02-27 DIAGNOSIS — R339 Retention of urine, unspecified: Secondary | ICD-10-CM

## 2013-02-27 DIAGNOSIS — C61 Malignant neoplasm of prostate: Secondary | ICD-10-CM | POA: Insufficient documentation

## 2013-02-27 DIAGNOSIS — Z87440 Personal history of urinary (tract) infections: Secondary | ICD-10-CM | POA: Insufficient documentation

## 2013-02-27 DIAGNOSIS — Z87891 Personal history of nicotine dependence: Secondary | ICD-10-CM | POA: Insufficient documentation

## 2013-02-27 LAB — URINALYSIS, MICROSCOPIC ONLY
Nitrite: NEGATIVE
Protein, ur: NEGATIVE mg/dL
Specific Gravity, Urine: 1.009 (ref 1.005–1.030)
Urobilinogen, UA: 0.2 mg/dL (ref 0.0–1.0)

## 2013-02-27 NOTE — ED Provider Notes (Signed)
History     CSN: [id number]  Arrival date & time 02/27/13  0324   First MD Initiated Contact with Patient 02/27/13 0345      Chief Complaint  Patient presents with  . Urinary Retention    (Consider location/radiation/quality/duration/timing/severity/associated sxs/prior treatment) HPI Casey Greene is a 62 year old male with history significant for HTN and prostate CA who presents to the ED with urinary retention. He reports having a 4 month history of urinary retention and recurrent UTIs. He reports seeing his urologist today to have his foley removed and a cystoscopy and urodynamic study were performed. He states the urodynamic study showed urinary retention due to enlarged prostate. The urologist increased his doxazosin from 4 mg to 8 mg. He had a PSA drawn but does not know the results. He states his PSA has been running high. He states today he has had mild dysuria and has only been able to urinate very small amounts. He reports some mild lower abdominal pain. He denies fever, chills, SOB, chest pain, nausea, vomiting, diarrhea, hematochezia, and hematuria. He is feeling better since foley has been placed in the ED. He denies any current pain.  Past Medical History  Diagnosis Date  . Hypertension   . Cancer 1997    prostate    History reviewed. No pertinent past surgical history.  Family History  Problem Relation Age of Onset  . Heart disease Mother   . Obesity Mother   . Heart disease Father   . Diabetes Father   . ALS Sister     History  Substance Use Topics  . Smoking status: Former Research scientist (life sciences)  . Smokeless tobacco: Never Used  . Alcohol Use: No      Review of Systems All other systems negative except as documented in the HPI. All pertinent positives and negatives as reviewed in the HPI.  Allergies  Review of patient's allergies indicates no known allergies.  Home Medications   Current Outpatient Rx  Name  Route  Sig  Dispense  Refill  . acetaminophen (TYLENOL)  500 MG tablet   Oral   Take 500 mg by mouth every 6 (six) hours as needed for pain.         Marland Kitchen doxazosin (CARDURA) 4 MG tablet   Oral   Take 4 mg by mouth at bedtime.          Marland Kitchen losartan (COZAAR) 50 MG tablet   Oral   Take 50 mg by mouth every morning.            BP 164/95  Pulse 81  Temp(Src) 98.2 F (36.8 C) (Oral)  Resp 16  SpO2 98%  Physical Exam  Constitutional: He appears well-developed and well-nourished.  HENT:  Head: Normocephalic and atraumatic.  Neck: Normal range of motion. Neck supple.  Cardiovascular: Normal rate, regular rhythm, normal heart sounds and intact distal pulses.   Pulmonary/Chest: Effort normal and breath sounds normal.  Abdominal: Soft. Bowel sounds are normal. He exhibits no distension. There is no tenderness.  Skin: Skin is warm and dry.    ED Course  Procedures (including critical care time)  Patient is feeling completely relief.  Patient is referred back to his urologist.  Told to return here as needed.  He will be sent home with a Foley in place.   [place], [name] 02/27/13 [phone]

## 2013-02-27 NOTE — ED Notes (Signed)
14 fr foley catheter inserted with ease with clear yellow urine return; after insertion pt states he had a cystoscopy and urodynamic study done today

## 2013-02-27 NOTE — ED Notes (Signed)
Pt had catheter removed this morning 9 am; since then increasing retention; pain and inability to urinate tonight

## 2013-03-01 LAB — URINE CULTURE: Colony Count: 30000

## 2013-03-02 ENCOUNTER — Telehealth (HOSPITAL_COMMUNITY): Payer: Self-pay | Admitting: Emergency Medicine

## 2013-03-02 NOTE — ED Notes (Signed)
Patient has +Urine culture. °

## 2013-03-02 NOTE — ED Notes (Signed)
+   Urine Chart sent to EDP office for review. 

## 2013-03-04 ENCOUNTER — Other Ambulatory Visit (HOSPITAL_COMMUNITY): Payer: Self-pay | Admitting: Urology

## 2013-03-04 ENCOUNTER — Telehealth (HOSPITAL_COMMUNITY): Payer: Self-pay | Admitting: Emergency Medicine

## 2013-03-04 DIAGNOSIS — C61 Malignant neoplasm of prostate: Secondary | ICD-10-CM

## 2013-03-04 NOTE — ED Provider Notes (Signed)
Medical screening examination/treatment/procedure(s) were performed by non-physician practitioner and as supervising physician I was immediately available for consultation/collaboration.   Mirna Mires, MD 03/04/13 2128

## 2013-03-04 NOTE — ED Notes (Signed)
Chart reviewed by ED PA - No further treatment per Irena Cords PA. Urine culture result faxed to Dr Janice Norrie, urologist that patient was referred to, fax # [phone].

## 2013-03-11 ENCOUNTER — Encounter (HOSPITAL_COMMUNITY): Payer: PRIVATE HEALTH INSURANCE

## 2013-03-11 ENCOUNTER — Encounter (HOSPITAL_COMMUNITY): Payer: Self-pay

## 2013-03-11 ENCOUNTER — Encounter (HOSPITAL_COMMUNITY)
Admission: RE | Admit: 2013-03-11 | Discharge: 2013-03-11 | Disposition: A | Discharge status: Home / Self Care | Payer: BC Managed Care – PPO | Source: Ambulatory Visit | Attending: Urology | Admitting: Urology

## 2013-03-11 ENCOUNTER — Ambulatory Visit (HOSPITAL_COMMUNITY): Payer: PRIVATE HEALTH INSURANCE

## 2013-03-11 DIAGNOSIS — C61 Malignant neoplasm of prostate: Secondary | ICD-10-CM | POA: Insufficient documentation

## 2013-03-11 MED ORDER — TECHNETIUM TC 99M MEDRONATE IV KIT
21.2000 | PACK | Freq: Once | INTRAVENOUS | Status: AC | PRN
  Administered 2013-03-11: 21.2 via INTRAVENOUS

## 2013-04-25 ENCOUNTER — Emergency Department (HOSPITAL_COMMUNITY)
Admission: EM | Admit: 2013-04-25 | Discharge: 2013-04-25 | Disposition: A | Discharge status: Home / Self Care | Payer: BC Managed Care – PPO | Attending: Emergency Medicine | Admitting: Emergency Medicine

## 2013-04-25 ENCOUNTER — Encounter (HOSPITAL_COMMUNITY): Payer: Self-pay | Admitting: Family Medicine

## 2013-04-25 ENCOUNTER — Emergency Department (HOSPITAL_COMMUNITY)
Admission: EM | Admit: 2013-04-25 | Discharge: 2013-04-26 | Disposition: A | Discharge status: Home / Self Care | Payer: BC Managed Care – PPO | Attending: Emergency Medicine | Admitting: Emergency Medicine

## 2013-04-25 ENCOUNTER — Encounter (HOSPITAL_COMMUNITY): Payer: Self-pay | Admitting: Emergency Medicine

## 2013-04-25 DIAGNOSIS — R0989 Other specified symptoms and signs involving the circulatory and respiratory systems: Secondary | ICD-10-CM | POA: Insufficient documentation

## 2013-04-25 DIAGNOSIS — I10 Essential (primary) hypertension: Secondary | ICD-10-CM | POA: Insufficient documentation

## 2013-04-25 DIAGNOSIS — Z79899 Other long term (current) drug therapy: Secondary | ICD-10-CM | POA: Insufficient documentation

## 2013-04-25 DIAGNOSIS — R059 Cough, unspecified: Secondary | ICD-10-CM | POA: Insufficient documentation

## 2013-04-25 DIAGNOSIS — M7989 Other specified soft tissue disorders: Secondary | ICD-10-CM | POA: Insufficient documentation

## 2013-04-25 DIAGNOSIS — Z87891 Personal history of nicotine dependence: Secondary | ICD-10-CM | POA: Insufficient documentation

## 2013-04-25 DIAGNOSIS — R3915 Urgency of urination: Secondary | ICD-10-CM | POA: Insufficient documentation

## 2013-04-25 DIAGNOSIS — R05 Cough: Secondary | ICD-10-CM | POA: Insufficient documentation

## 2013-04-25 DIAGNOSIS — R319 Hematuria, unspecified: Secondary | ICD-10-CM

## 2013-04-25 DIAGNOSIS — C61 Malignant neoplasm of prostate: Secondary | ICD-10-CM | POA: Insufficient documentation

## 2013-04-25 DIAGNOSIS — N390 Urinary tract infection, site not specified: Secondary | ICD-10-CM

## 2013-04-25 DIAGNOSIS — N489 Disorder of penis, unspecified: Secondary | ICD-10-CM | POA: Insufficient documentation

## 2013-04-25 DIAGNOSIS — Z8546 Personal history of malignant neoplasm of prostate: Secondary | ICD-10-CM | POA: Insufficient documentation

## 2013-04-25 LAB — URINALYSIS, ROUTINE W REFLEX MICROSCOPIC
Bilirubin Urine: NEGATIVE
Nitrite: POSITIVE — AB
Nitrite: NEGATIVE
Specific Gravity, Urine: 1.018 (ref 1.005–1.030)
Specific Gravity, Urine: 1.019 (ref 1.005–1.030)
Urobilinogen, UA: 1 mg/dL (ref 0.0–1.0)
Urobilinogen, UA: 0.2 mg/dL (ref 0.0–1.0)
pH: 7.5 (ref 5.0–8.0)
pH: 7.5 (ref 5.0–8.0)

## 2013-04-25 LAB — BASIC METABOLIC PANEL
BUN: 17 mg/dL (ref 6–23)
Chloride: 96 mEq/L (ref 96–112)
GFR calc Af Amer: 69 mL/min — ABNORMAL LOW (ref >90)
GFR calc non Af Amer: 59 mL/min — ABNORMAL LOW (ref >90)
Potassium: 3.3 mEq/L — ABNORMAL LOW (ref 3.5–5.1)
Sodium: 135 mEq/L (ref 135–145)

## 2013-04-25 LAB — URINE MICROSCOPIC-ADD ON

## 2013-04-25 LAB — CBC WITH DIFFERENTIAL/PLATELET
Basophils Relative: 1 % (ref 0–1)
Eosinophils Absolute: 0.1 10*3/uL (ref 0.0–0.7)
Hemoglobin: 12 g/dL — ABNORMAL LOW (ref 13.0–17.0)
Lymphs Abs: 1.6 10*3/uL (ref 0.7–4.0)
MCH: 30.2 pg (ref 26.0–34.0)
MCHC: 33.7 g/dL (ref 30.0–36.0)
Monocytes Relative: 9 % (ref 3–12)
Neutro Abs: 5.5 10*3/uL (ref 1.7–7.7)
Neutrophils Relative %: 69 % (ref 43–77)
Platelets: 180 10*3/uL (ref 150–400)
RBC: 3.97 MIL/uL — ABNORMAL LOW (ref 4.22–5.81)

## 2013-04-25 MED ORDER — LIDOCAINE HCL 2 % EX GEL
Freq: Once | CUTANEOUS | Status: AC
  Administered 2013-04-25: 10 via URETHRAL
  Filled 2013-04-25: qty 10

## 2013-04-25 MED ORDER — POTASSIUM CHLORIDE CRYS ER 20 MEQ PO TBCR
20.0000 meq | EXTENDED_RELEASE_TABLET | Freq: Once | ORAL | Status: AC
  Administered 2013-04-25: 20 meq via ORAL
  Filled 2013-04-25: qty 1

## 2013-04-25 NOTE — ED Provider Notes (Signed)
History     CSN: [id number]  Arrival date & time 04/25/13  0803   First MD Initiated Contact with Casey Greene 04/25/13 [phone]      Chief Complaint  Casey Greene presents with  . Hematuria    (Consider location/radiation/quality/duration/timing/severity/associated sxs/prior treatment) HPI Comments: Casey Greene with 14 year hx prostate cancer who has several recent visits for acute urinary retention, now with indwelling foley catheter.  Reports hematuria that began overnight. Pt states he has never had surgery or any treatment for his cancer.  States he is doing "hydrogen peroxide treatment," which is drinking diluted hydrogen peroxide.  Is followed by Dr Janice Norrie of [facility].  Notes mild irritation to tip of penis from indwelling foley catheter. Denies fevers, chills, abdominal pain, vomiting, penile pain or swelling, change in bowel habits.  Pt does have upper respiratory symptoms of nasal congestion and cough productive of yellow sputum but denies SOB, wheezing, or CP.    Casey Greene is a 62 y.o. male presenting with hematuria. The history is provided by the Casey Greene and the spouse.  Hematuria Associated symptoms include congestion and coughing. Pertinent negatives include no chest pain, chills, fever or vomiting.    Past Medical History  Diagnosis Date  . Hypertension   . Cancer 1997    prostate    History reviewed. No pertinent past surgical history.  Family History  Problem Relation Age of Onset  . Heart disease Mother   . Obesity Mother   . Heart disease Father   . Diabetes Father   . ALS Sister     History  Substance Use Topics  . Smoking status: Former Research scientist (life sciences)  . Smokeless tobacco: Never Used  . Alcohol Use: No      Review of Systems  Constitutional: Negative for fever and chills.  HENT: Positive for congestion. Negative for trouble swallowing.   Respiratory: Positive for cough. Negative for shortness of breath and wheezing.   Cardiovascular: Positive for leg swelling.  Negative for chest pain.       Bilateral ankle swelling  Gastrointestinal: Negative for vomiting, diarrhea, constipation and blood in stool.  Genitourinary: Positive for hematuria.    Allergies  Review of Casey Greene's allergies indicates no known allergies.  Home Medications   Current Outpatient Rx  Name  Route  Sig  Dispense  Refill  . acetaminophen (TYLENOL) 500 MG tablet   Oral   Take 500 mg by mouth every 6 (six) hours as needed for pain.         Marland Kitchen doxazosin (CARDURA) 4 MG tablet   Oral   Take 4 mg by mouth at bedtime.          Marland Kitchen losartan (COZAAR) 50 MG tablet   Oral   Take 50 mg by mouth every morning.            BP 174/106  Pulse 74  Temp(Src) 98.2 F (36.8 C) (Oral)  Resp 21  SpO2 95%  Physical Exam  Nursing note and vitals reviewed. Constitutional: He appears well-developed and well-nourished. No distress.  HENT:  Head: Normocephalic and atraumatic.  Neck: Neck supple.  Cardiovascular: Normal rate and regular rhythm.   Pulmonary/Chest: Effort normal and breath sounds normal. No respiratory distress. He has no wheezes. He has no rales.  Abdominal: Soft. He exhibits no distension and no mass. There is no tenderness. There is no rebound and no guarding.  Genitourinary:    Circumcised.  Neurological: He is alert. He exhibits normal muscle tone.  Skin: He is not  diaphoretic.    ED Course  Procedures (including critical care time)  Labs Reviewed  CBC WITH DIFFERENTIAL - Abnormal; Notable for the following:    RBC 3.97 (*)    Hemoglobin 12.0 (*)    HCT 35.6 (*)    All other components within normal limits  BASIC METABOLIC PANEL - Abnormal; Notable for the following:    Potassium 3.3 (*)    Glucose, Bld 108 (*)    GFR calc non Af Amer 59 (*)    GFR calc Af Amer 69 (*)    All other components within normal limits  URINALYSIS, ROUTINE W REFLEX MICROSCOPIC - Abnormal; Notable for the following:    Color, Urine RED (*)    APPearance TURBID (*)     Hgb urine dipstick LARGE (*)    Protein, ur 100 (*)    Nitrite POSITIVE (*)    Leukocytes, UA LARGE (*)    All other components within normal limits  URINE MICROSCOPIC-ADD ON - Abnormal; Notable for the following:    Bacteria, UA FEW (*)    All other components within normal limits  URINE CULTURE   No results found.  11:22 AM Casey Greene's wife states she was told by urologist to not treat UTI because of foley.  I will consult urology to discuss.    Discussed Casey Greene with Dr Junious Silk who states Casey Greene will not be harmed by 5 days of antibiotics but also would not likely be harmed without them as long as the foley catheter continues to drain appropriately.  I have discussed this with Casey Greene and his spouse who decline antibiotics.  They have appt with urology on Wednesday, will disuss it them.  I have discussed strict return precautions with them.     1. Hematuria   2. Chronic indwelling Foley catheter     MDM  Pt with chronic indwelling foley catheter due to prostatic hypertrophy and prostate cancer, now with hematuria.  Foley catheter is draining well, pt is having no fever, chills, abdominal pain or distension, or vomiting.  UA is nitrite positive, 21-50 WBC, few bacteria.  After discussion with Dr Junious Silk and Casey Greene and spouse, will not prescribe antibiotics.  Discussed all results with Casey Greene.  Pt given return precautions.  Pt verbalizes understanding and agrees with plan.   Ankle swelling likely due to increased sodium intake from eating a lot of canned soups for his URI.    I doubt any other EMC precluding discharge at this time including, but not necessarily limited to the following: pneumonia, urosepsis, catheter malfunction or perforation of bladder        Clayton Bibles, [name] 04/25/13 1322

## 2013-04-25 NOTE — ED Notes (Signed)
Current indwelling 16 Fr Foley cath removed without difficulty after 16ml balloon was deflated. Clot noted to tip of catheter lumen.

## 2013-04-25 NOTE — ED Provider Notes (Signed)
History     CSN: [id number]  Arrival date & time 04/25/13  2043   First MD Initiated Contact with Patient 04/25/13 2301      Chief Complaint  Patient presents with  . Urinary Retention    (Consider location/radiation/quality/duration/timing/severity/associated sxs/prior treatment) HPI  62 year old male with history is significant for hypertension and prostate cancer with recurrent urinary retention now with indwelling Foley catheter who presents for evaluations of urinary retention. Patient states this morning he notice blood in his urine which he has not had in the past. He went to the ER for evaluation and at that time he was found to have a urinary tract infection, with nitrite positive and WBC 21-50. Urology, Dr. Junious Silk was consult and who offer choice of abx but pt decided no treatment. Patient return home however he began to develop pressure to low abdomen with sensation of urinary retention. Pt also has sensation of urge to urinate.  Patient returned to the ED tonight for further management. Patient denies fever, chills, lightheadedness, dizziness, chest pain, shortness of breath, back pain.   Past Medical History  Diagnosis Date  . Hypertension   . Cancer 1997    prostate    No past surgical history on file.  Family History  Problem Relation Age of Onset  . Heart disease Mother   . Obesity Mother   . Heart disease Father   . Diabetes Father   . ALS Sister     History  Substance Use Topics  . Smoking status: Former Research scientist (life sciences)  . Smokeless tobacco: Never Used  . Alcohol Use: No      Review of Systems  Constitutional: Negative for fever.  Respiratory: Negative for shortness of breath.   Cardiovascular: Negative for chest pain.  Genitourinary: Positive for hematuria, decreased urine volume and penile pain. Negative for flank pain, discharge and testicular pain.  Skin: Negative for rash.  Neurological: Negative for light-headedness and headaches.  All other  systems reviewed and are negative.    Allergies  Review of patient's allergies indicates no known allergies.  Home Medications   Current Outpatient Rx  Name  Route  Sig  Dispense  Refill  . acetaminophen (TYLENOL) 500 MG tablet   Oral   Take 500-1,000 mg by mouth every 6 (six) hours as needed for pain.          Marland Kitchen doxazosin (CARDURA) 4 MG tablet   Oral   Take 4 mg by mouth at bedtime.         Marland Kitchen losartan (COZAAR) 50 MG tablet   Oral   Take 50 mg by mouth every morning.            BP 160/108  Pulse 101  Temp(Src) 98.8 F (37.1 C) (Oral)  Resp 20  SpO2 98%  Physical Exam  Nursing note and vitals reviewed. Constitutional: He is oriented to person, place, and time. He appears well-developed and well-nourished. No distress.  HENT:  Head: Atraumatic.  Eyes: Conjunctivae are normal.  Neck: Neck supple.  Abdominal: Soft. There is tenderness (suprapubic tenderness, non distended.). There is no rebound and no guarding.  Genitourinary:  Tenderness at tip of penis around catheter site, no signs of infection.  Foley catheter with hematuria and clots.  Neurological: He is alert and oriented to person, place, and time.  Skin: Skin is warm. No rash noted.  Psychiatric: He has a normal mood and affect.    ED Course  Procedures (including critical care time)  11:29 PM  Pt report urinary retention despite having an indwelling foley catheter, also having new onset of hematuria.  His foley was replaced, evidence of clots and hematuria were noted.  Pt is putting out 400cc of urine so far.  Bladder scan showing 237cc of retained urine.    12:01 AM Pt is putting out urine roughly 30cc per hr.  Pt reports this AM when offering the choice to treat for UTI, pt declined.  However, he is now requesting for treatment of UTI. Last urine culture in April shows susceptibility to cipro.  Will treat with cipro here.  Will also irrigate bladder to evacuates any clots prior to discharge.  Pt also  has hx of HTN, initially BP was 203/112, it is 172/116 after placement of foley catheter.  Care discussed with attending.    Labs Reviewed  URINALYSIS, ROUTINE W REFLEX MICROSCOPIC - Abnormal; Notable for the following:    APPearance CLOUDY (*)    Hgb urine dipstick LARGE (*)    Protein, ur >300 (*)    Leukocytes, UA MODERATE (*)    All other components within normal limits  POCT I-STAT, CHEM 8 - Abnormal; Notable for the following:    Potassium 3.4 (*)    Creatinine, Ser 1.40 (*)    Glucose, Bld 105 (*)    Hemoglobin 12.9 (*)    HCT 38.0 (*)    All other components within normal limits  URINE MICROSCOPIC-ADD ON   No results found.   1. UTI (lower urinary tract infection)   2. Hematuria       MDM  BP 167/116  Pulse 91  Temp(Src) 98.8 F (37.1 C) (Oral)  Resp 18  SpO2 97%  I have reviewed nursing notes and vital signs.  I reviewed available ER/hospitalization records thought the EMR         Domenic Moras, [place] 04/27/13 [id number]

## 2013-04-25 NOTE — ED Notes (Signed)
Patient here for urinary retention. States that he has a foley cath in place since April. States catheter is due to be changed on Wednesday by [facility]. Patient states that catheter stopped draining earlier today. C/o lower abdominal pressure.

## 2013-04-25 NOTE — ED Notes (Signed)
Bladder scan shows 267ml.

## 2013-04-25 NOTE — ED Notes (Signed)
Pt here for blood in urine x1 day denies injury

## 2013-04-26 LAB — POCT I-STAT, CHEM 8
BUN: 20 mg/dL (ref 6–23)
Calcium, Ion: 1.15 mmol/L (ref 1.13–1.30)
Chloride: 103 mEq/L (ref 96–112)
Creatinine, Ser: 1.4 mg/dL — ABNORMAL HIGH (ref 0.50–1.35)
Glucose, Bld: 105 mg/dL — ABNORMAL HIGH (ref 70–99)
HCT: 38 % — ABNORMAL LOW (ref 39.0–52.0)
Hemoglobin: 12.9 g/dL — ABNORMAL LOW (ref 13.0–17.0)
Potassium: 3.4 mEq/L — ABNORMAL LOW (ref 3.5–5.1)
Sodium: 142 mEq/L (ref 135–145)
TCO2: 30 mmol/L (ref 0–100)

## 2013-04-26 LAB — URINE CULTURE

## 2013-04-26 MED ORDER — CIPROFLOXACIN HCL 500 MG PO TABS: 500.0000 mg | ORAL_TABLET | Freq: Two times a day (BID) | ORAL | Status: DC

## 2013-04-26 MED ORDER — CIPROFLOXACIN HCL 500 MG PO TABS
500.0000 mg | ORAL_TABLET | Freq: Once | ORAL | Status: AC
  Administered 2013-04-26: 500 mg via ORAL
  Filled 2013-04-26: qty 1

## 2013-04-26 NOTE — ED Notes (Signed)
Pt's foley catheter irrigated with 106ml of sterile water and 64ml of urine was pulled back along with some blood clots.  54ml of additional sterile water irrigated and catheter drained with pink urine.  Pt denied any pain and Delos Haring, PA was made aware.

## 2013-04-26 NOTE — ED Provider Notes (Signed)
Medical screening examination/treatment/procedure(s) were performed by non-physician practitioner and as supervising physician I was immediately available for consultation/collaboration.  Leota Jacobsen, MD 04/26/13 [phone]

## 2013-04-27 NOTE — ED Provider Notes (Signed)
Medical screening examination/treatment/procedure(s) were performed by non-physician practitioner and as supervising physician I was immediately available for consultation/collaboration.  Virgel Manifold, MD 04/27/13 [phone]

## 2013-09-29 ENCOUNTER — Ambulatory Visit: Payer: Self-pay | Admitting: Podiatry

## 2013-10-06 ENCOUNTER — Ambulatory Visit: Payer: Self-pay | Admitting: Podiatry

## 2013-11-24 ENCOUNTER — Ambulatory Visit: Payer: Self-pay | Admitting: Podiatry

## 2013-12-01 ENCOUNTER — Ambulatory Visit (INDEPENDENT_AMBULATORY_CARE_PROVIDER_SITE_OTHER): Payer: BC Managed Care – PPO | Admitting: Podiatry

## 2013-12-01 ENCOUNTER — Encounter: Payer: Self-pay | Admitting: Podiatry

## 2013-12-01 VITALS — BP 176/110 | HR 94 | Ht 70.0 in | Wt 205.0 lb

## 2013-12-01 DIAGNOSIS — M79673 Pain in unspecified foot: Secondary | ICD-10-CM

## 2013-12-01 DIAGNOSIS — B351 Tinea unguium: Secondary | ICD-10-CM

## 2013-12-01 DIAGNOSIS — I739 Peripheral vascular disease, unspecified: Secondary | ICD-10-CM

## 2013-12-01 DIAGNOSIS — M79609 Pain in unspecified limb: Secondary | ICD-10-CM

## 2013-12-01 HISTORY — DX: Pain in unspecified foot: M79.673

## 2013-12-01 HISTORY — DX: Tinea unguium: B35.1

## 2013-12-01 HISTORY — DX: Peripheral vascular disease, unspecified: I73.9

## 2013-12-01 NOTE — Patient Instructions (Signed)
Seen for hypertrophic nails. All nails debrided. Return in 3 months.

## 2013-12-01 NOTE — Progress Notes (Signed)
Subjective: 63 year old male presents complaining of having a chronic Gout in big joints, fungus and thick toe nail problems.  Stated that he was diagnosed with Prostate cancer 9 years ago, and treats himself with Holistic therapy.   Objective: Dermatologic: Thick and dystrophic nails x 10. Vascular: Pedal DP and PT pulses are not palpable. Hyperpigmented skin with poor skin texture. No edema or erythema noted over the first MPJ bilateral.  Neurologic: Unable to respond to Monofilament sensory testing on digits of both feet. Normal response to vibratory sensations.  Orthopedic: Hallux valgus with bunion deformity bilateral.    Assessment: Onychomycosis x 10.  PVD.  Plan:  Palliation as needed. Reviewed home care for fungal nails.  Debrided all nails.

## 2014-03-11 ENCOUNTER — Encounter (HOSPITAL_COMMUNITY): Payer: Self-pay | Admitting: Emergency Medicine

## 2014-03-11 ENCOUNTER — Emergency Department (HOSPITAL_COMMUNITY)
Admission: EM | Admit: 2014-03-11 | Discharge: 2014-03-11 | Disposition: A | Discharge status: Home / Self Care | Payer: Medicare HMO | Attending: Emergency Medicine | Admitting: Emergency Medicine

## 2014-03-11 DIAGNOSIS — Z8546 Personal history of malignant neoplasm of prostate: Secondary | ICD-10-CM | POA: Insufficient documentation

## 2014-03-11 DIAGNOSIS — Z79899 Other long term (current) drug therapy: Secondary | ICD-10-CM | POA: Insufficient documentation

## 2014-03-11 DIAGNOSIS — N3289 Other specified disorders of bladder: Secondary | ICD-10-CM | POA: Insufficient documentation

## 2014-03-11 DIAGNOSIS — R339 Retention of urine, unspecified: Secondary | ICD-10-CM | POA: Insufficient documentation

## 2014-03-11 DIAGNOSIS — Z87891 Personal history of nicotine dependence: Secondary | ICD-10-CM | POA: Insufficient documentation

## 2014-03-11 DIAGNOSIS — I10 Essential (primary) hypertension: Secondary | ICD-10-CM | POA: Insufficient documentation

## 2014-03-11 LAB — URINALYSIS, ROUTINE W REFLEX MICROSCOPIC
Bilirubin Urine: NEGATIVE
Glucose, UA: NEGATIVE mg/dL
KETONES UR: NEGATIVE mg/dL
NITRITE: NEGATIVE
PROTEIN: 30 mg/dL — AB
Specific Gravity, Urine: 1.01 (ref 1.005–1.030)
UROBILINOGEN UA: 0.2 mg/dL (ref 0.0–1.0)
pH: 7.5 (ref 5.0–8.0)

## 2014-03-11 LAB — BASIC METABOLIC PANEL
BUN: 23 mg/dL (ref 6–23)
CO2: 24 mEq/L (ref 19–32)
Calcium: 9.7 mg/dL (ref 8.4–10.5)
Chloride: 98 mEq/L (ref 96–112)
Creatinine, Ser: 1.41 mg/dL — ABNORMAL HIGH (ref 0.50–1.35)
GFR calc Af Amer: 59 mL/min — ABNORMAL LOW (ref >90)
GFR calc non Af Amer: 51 mL/min — ABNORMAL LOW (ref >90)
Glucose, Bld: 104 mg/dL — ABNORMAL HIGH (ref 70–99)
Potassium: 3.5 mEq/L — ABNORMAL LOW (ref 3.7–5.3)
Sodium: 140 mEq/L (ref 137–147)

## 2014-03-11 LAB — CBC WITH DIFFERENTIAL/PLATELET
Basophils Absolute: 0 10*3/uL (ref 0.0–0.1)
Basophils Relative: 1 % (ref 0–1)
Eosinophils Absolute: 0.1 10*3/uL (ref 0.0–0.7)
Eosinophils Relative: 1 % (ref 0–5)
HCT: 39.7 % (ref 39.0–52.0)
Hemoglobin: 13.9 g/dL (ref 13.0–17.0)
Lymphocytes Relative: 26 % (ref 12–46)
Lymphs Abs: 1.7 10*3/uL (ref 0.7–4.0)
MCH: 31.7 pg (ref 26.0–34.0)
MCHC: 35 g/dL (ref 30.0–36.0)
MCV: 90.6 fL (ref 78.0–100.0)
Monocytes Absolute: 0.5 10*3/uL (ref 0.1–1.0)
Monocytes Relative: 7 % (ref 3–12)
Neutro Abs: 4.2 10*3/uL (ref 1.7–7.7)
Neutrophils Relative %: 65 % (ref 43–77)
Platelets: 154 10*3/uL (ref 150–400)
RBC: 4.38 MIL/uL (ref 4.22–5.81)
RDW: 12.7 % (ref 11.5–15.5)
WBC: 6.4 10*3/uL (ref 4.0–10.5)

## 2014-03-11 LAB — URINE MICROSCOPIC-ADD ON

## 2014-03-11 NOTE — ED Notes (Signed)
Pt reports today at 1500 urinary catheter removed by urology. He has it for prostate cancer. C/o pain to bladder, spasms, hasn't been able to urinate. Is restless in chair.

## 2014-03-11 NOTE — ED Provider Notes (Signed)
CSN: [id number]     Arrival date & time 03/11/14  1715 History   First MD Initiated Contact with Patient 03/11/14 1831    This chart was scribed for non-physician practitioner, Jeannett Senior, PA, working with Virgel Manifold, MD by Terressa Koyanagi, ED Scribe. This patient was seen in room TR09C/TR09C and the patient's care was started at 6:48 PM.  PCP: Salena Saner., MD  Chief Complaint  Patient presents with  . Urinary Retention   HPI HPI Comments: Casey Greene is a 63 y.o. male, with a history of prostate CA and HTN, who presents to the Emergency Department complaining of urinary retention with associated bladder pain and spasms onset today around 3:00PM, after his urinary catheter was removed by urology. Pt reports he had a urinary catheter in for one year prior to its removal today. Pt reports he is not in any pain presently. He denies any fever, chills, malaise. No nausea or vomiting. He states he just needs his Foley placed back in sig and followup with a urologist.  Past Medical History  Diagnosis Date  . Hypertension   . Cancer 1997    prostate   History reviewed. No pertinent past surgical history. Family History  Problem Relation Age of Onset  . Heart disease Mother   . Obesity Mother   . Heart disease Father   . Diabetes Father   . ALS Sister    History  Substance Use Topics  . Smoking status: Former Research scientist (life sciences)  . Smokeless tobacco: Never Used  . Alcohol Use: No    Review of Systems  Constitutional: Negative for fever.  Genitourinary: Positive for difficulty urinating (urinary retention).       Bladder pain and spasms       Allergies  Review of patient's allergies indicates no known allergies.  Home Medications   Prior to Admission medications   Medication Sig Start Date End Date Taking? Authorizing Provider  acetaminophen (TYLENOL) 500 MG tablet Take 500-1,000 mg by mouth every 6 (six) hours as needed for pain.     Historical Provider, MD  doxazosin  (CARDURA) 4 MG tablet Take 4 mg by mouth at bedtime.    Historical Provider, MD  losartan (COZAAR) 50 MG tablet Take 50 mg by mouth every morning.     Historical Provider, MD   Triage Vitals: BP 157/97  Pulse 57  Temp(Src) 97.4 F (36.3 C) (Oral)  Resp 22  Ht 5\' 10"  (1.778 m)  Wt 195 lb (88.451 kg)  BMI 27.98 kg/m2  SpO2 100% Physical Exam  Nursing note and vitals reviewed. Constitutional: He is oriented to person, place, and time. He appears well-developed and well-nourished. No distress.  HENT:  Head: Normocephalic and atraumatic.  Eyes: EOM are normal.  Neck: Neck supple. No tracheal deviation present.  Cardiovascular: Normal rate.   Pulmonary/Chest: Effort normal. No respiratory distress.  Abdominal: Soft. Bowel sounds are normal. He exhibits no distension. There is no tenderness. There is no rebound and no guarding.  Musculoskeletal: Normal range of motion.  Neurological: He is alert and oriented to person, place, and time.  Skin: Skin is warm and dry.  Psychiatric: He has a normal mood and affect. His behavior is normal.    ED Course  Procedures (including critical care time) DIAGNOSTIC STUDIES: Oxygen Saturation is 100% on RA, normal by my interpretation.    COORDINATION OF CARE:  6:52 PM-Discussed treatment plan which includes UA, inserting foley catheter, and labs with pt at bedside and pt agreed to  plan.     Labs Review Labs Reviewed  URINALYSIS, ROUTINE W REFLEX MICROSCOPIC - Abnormal; Notable for the following:    APPearance CLOUDY (*)    Hgb urine dipstick LARGE (*)    Protein, ur 30 (*)    Leukocytes, UA MODERATE (*)    All other components within normal limits  BASIC METABOLIC PANEL - Abnormal; Notable for the following:    Potassium 3.5 (*)    Glucose, Bld 104 (*)    Creatinine, Ser 1.41 (*)    GFR calc non Af Amer 51 (*)    GFR calc Af Amer 59 (*)    All other components within normal limits  URINE MICROSCOPIC-ADD ON - Abnormal; Notable for the  following:    Bacteria, UA FEW (*)    All other components within normal limits  CBC WITH DIFFERENTIAL    Imaging Review No results found.   EKG Interpretation None      MDM   Final diagnoses:  Urinary retention    Patient initially presented to emergency department after he was unable to void at home. He has history of prostate cancer and had a Foley placed and states he has had it in for a year. Foley was removed today by urologist. He states he is unable to urinate so he came to emergency department. He reports pressure in the lower abdomen. The time I saw the patient, here he had a Foley placed which had 400 cc of urine. He states he feels much better and wants to be discharged home. Labs were obtained which showed slightly elevated creatinine level, but is very similar to the prior one we have on record which was done a year ago. Have explained to him the findings. Will discharge him home at this time of followup with his urologist. Patient denies any complaints at time of the discharge. His urine does appear to be possibly infected, however he denies any fever, chills, pain with urination. Will hold off on antibiotics at this time.  Filed Vitals:   03/11/14 1726 03/11/14 1802  BP: 240/122 157/97  Pulse: 57   Temp: 97.4 F (36.3 C)   TempSrc: Oral   Resp: 22   Height: 5\' 10"  (1.778 m)   Weight: 195 lb (88.451 kg)   SpO2: 100%     I personally performed the services described in this documentation, which was scribed in my presence. The recorded information has been reviewed and is accurate.    Renold Genta, [name] 03/11/14 2323

## 2014-03-11 NOTE — Discharge Instructions (Signed)
Please follow up with Dr. Junious Silk as soon as able. Continue foley care at home.   Foley Catheter Care, Adult A Foley catheter is a soft, flexible tube that is placed into the bladder to drain urine. A Foley catheter may be inserted if:  You leak urine or are not able to control when you urinate (urinary incontinence).  You are not able to urinate when you need to (urinary retention).  You had prostate surgery or surgery on the genitals.  You have certain medical conditions, such as multiple sclerosis, dementia, or a spinal cord injury. If you are going home with a Foley catheter in place, follow the instructions below. TAKING CARE OF THE CATHETER 1. Wash your hands with soap and water. 2. Using mild soap and warm water on a clean washcloth:  Clean the area on your body closest to the catheter insertion site using a circular motion, moving away from the catheter. Never wipe toward the catheter because this could sweep bacteria up into the urethra and cause infection.  Remove all traces of soap. Pat the area dry with a clean towel. For males, reposition the foreskin. 3. Attach the catheter to your leg so there is no tension on the catheter. Use adhesive tape or a leg strap. If you are using adhesive tape, remove any sticky residue left behind by the previous tape you used. 4. Keep the drainage bag below the level of the bladder, but keep it off the floor. 5. Check throughout the day to be sure the catheter is working and urine is draining freely. Make sure the tubing does not become kinked. 6. Do not pull on the catheter or try to remove it. Pulling could damage internal tissues. TAKING CARE OF THE DRAINAGE BAGS You will be given two drainage bags to take home. One is a large overnight drainage bag, and the other is a smaller leg bag that fits underneath clothing. You may wear the overnight bag at any time, but you should never wear the smaller leg bag at night. Follow the instructions below  for how to empty, change, and clean your drainage bags. Emptying the Drainage Bag You must empty your drainage bag when it is    full or at least 2 3 times a day. 1. Wash your hands with soap and water. 2. Keep the drainage bag below your hips, below the level of your bladder. This stops urine from going back into the tubing and into your bladder. 3. Hold the dirty bag over the toilet or a clean container. 4. Open the pour spout at the bottom of the bag and empty the urine into the toilet or container. Do not let the pour spout touch the toilet, container, or any other surface. Doing so can place bacteria on the bag, which can cause an infection. 5. Clean the pour spout with a gauze pad or cotton ball that has rubbing alcohol on it. 6. Close the pour spout. 7. Attach the bag to your leg with adhesive tape or a leg strap. 8. Wash your hands well. Changing the Drainage Bag Change your drainage bag once a month or sooner if it starts to smell bad or look dirty. Below are steps to follow when changing the drainage bag. 1. Wash your hands with soap and water. 2. Pinch off the rubber catheter so that urine does not spill out. 3. Disconnect the catheter tube from the drainage tube at the connection valve. Do not let the tubes touch any surface.  4. Clean the end of the catheter tube with an alcohol wipe. Use a different alcohol wipe to clean the end of the drainage tube. 5. Connect the catheter tube to the drainage tube of the clean drainage bag. 6. Attach the new bag to the leg with adhesive tape or a leg strap. Avoid attaching the new bag too tightly. 7. Wash your hands well. Cleaning the Drainage Bag 1. Wash your hands with soap and water. 2. Wash the bag in warm, soapy water. 3. Rinse the bag thoroughly with warm water. 4. Fill the bag with a solution of white vinegar and water (1 cup vinegar to 1 qt warm water [.2 L vinegar to 1 L warm water]). Close the bag and soak it for 30 minutes in the  solution. 5. Rinse the bag with warm water. 6. Hang the bag to dry with the pour spout open and hanging downward. 7. Store the clean bag (once it is dry) in a clean plastic bag. 8. Wash your hands well. PREVENTING INFECTION  Wash your hands before and after handling your catheter.  Take showers daily and wash the area where the catheter enters your body. Do not take baths. Replace wet leg straps with dry ones, if this applies.  Do not use powders, sprays, or lotions on the genital area. Only use creams, lotions, or ointments as directed by your caregiver.  For females, wipe from front to back after each bowel movement.  Drink enough fluids to keep your urine clear or pale yellow unless you have a fluid restriction.  Do not let the drainage bag or tubing touch or lie on the floor.  Wear cotton underwear to absorb moisture and to keep your skin drier. SEEK MEDICAL CARE IF:   Your urine is cloudy or smells unusually bad.  Your catheter becomes clogged.  You are not draining urine into the bag or your bladder feels full.  Your catheter starts to leak. SEEK IMMEDIATE MEDICAL CARE IF:   You have pain, swelling, redness, or pus where the catheter enters the body.  You have pain in the abdomen, legs, lower back, or bladder.  You have a fever.  You see blood fill the catheter, or your urine is pink or red.  You have nausea, vomiting, or chills.  Your catheter gets pulled out. MAKE SURE YOU:   Understand these instructions.  Will watch your condition.  Will get help right away if you are not doing well or get worse. Document Released: 11/12/2005 Document Revised: 03/09/2013 Document Reviewed: 11/03/2012 [facility] Patient Information 2014 [place].

## 2014-03-11 NOTE — ED Notes (Signed)
Pt restless, going to fast track for foley catheter placement. Needs to move to back.

## 2014-03-12 ENCOUNTER — Other Ambulatory Visit (HOSPITAL_COMMUNITY): Payer: Self-pay | Admitting: Urology

## 2014-03-12 DIAGNOSIS — C61 Malignant neoplasm of prostate: Secondary | ICD-10-CM

## 2014-03-15 NOTE — ED Provider Notes (Signed)
Medical screening examination/treatment/procedure(s) were performed by non-physician practitioner and as supervising physician I was immediately available for consultation/collaboration.   EKG Interpretation None       Virgel Manifold, MD 03/15/14 [phone]

## 2014-03-24 ENCOUNTER — Ambulatory Visit (HOSPITAL_COMMUNITY)
Admission: RE | Admit: 2014-03-24 | Discharge: 2014-03-24 | Disposition: A | Discharge status: Home / Self Care | Payer: Medicare HMO | Source: Ambulatory Visit | Attending: Urology | Admitting: Urology

## 2014-03-24 ENCOUNTER — Encounter (HOSPITAL_COMMUNITY): Payer: Self-pay

## 2014-03-24 DIAGNOSIS — C61 Malignant neoplasm of prostate: Secondary | ICD-10-CM

## 2014-03-24 MED ORDER — GADOBENATE DIMEGLUMINE 529 MG/ML IV SOLN
20.0000 mL | Freq: Once | INTRAVENOUS | Status: AC | PRN
  Administered 2014-03-24: 19 mL via INTRAVENOUS

## 2014-06-09 DIAGNOSIS — I10 Essential (primary) hypertension: Secondary | ICD-10-CM

## 2014-06-09 DIAGNOSIS — C61 Malignant neoplasm of prostate: Secondary | ICD-10-CM | POA: Diagnosis present

## 2014-06-09 HISTORY — DX: Essential (primary) hypertension: I10

## 2014-06-09 HISTORY — DX: Malignant neoplasm of prostate: C61

## 2014-06-29 DIAGNOSIS — E785 Hyperlipidemia, unspecified: Secondary | ICD-10-CM

## 2014-06-29 HISTORY — DX: Hyperlipidemia, unspecified: E78.5

## 2014-07-05 DIAGNOSIS — B182 Chronic viral hepatitis C: Secondary | ICD-10-CM | POA: Insufficient documentation

## 2014-07-05 DIAGNOSIS — Z8619 Personal history of other infectious and parasitic diseases: Secondary | ICD-10-CM

## 2014-07-05 HISTORY — DX: Personal history of other infectious and parasitic diseases: Z86.19

## 2014-07-05 HISTORY — DX: Chronic viral hepatitis C: B18.2

## 2014-08-05 ENCOUNTER — Encounter: Payer: Self-pay | Admitting: Gastroenterology

## 2014-09-08 ENCOUNTER — Ambulatory Visit (AMBULATORY_SURGERY_CENTER): Payer: Self-pay | Admitting: *Deleted

## 2014-09-08 VITALS — Ht 70.0 in | Wt 197.4 lb

## 2014-09-08 DIAGNOSIS — Z1211 Encounter for screening for malignant neoplasm of colon: Secondary | ICD-10-CM

## 2014-09-08 MED ORDER — MOVIPREP 100 G PO SOLR: 1.0000 | Freq: Once | ORAL | Status: DC

## 2014-09-08 NOTE — Progress Notes (Signed)
No egg or soy allergy. ewm No home 02 use. ewm No c pap use. Ewm, No sedation issues in the past. emw

## 2014-09-16 ENCOUNTER — Encounter: Payer: Self-pay | Admitting: Internal Medicine

## 2014-09-23 ENCOUNTER — Encounter: Payer: PRIVATE HEALTH INSURANCE | Admitting: Internal Medicine

## 2014-09-24 ENCOUNTER — Encounter: Payer: PRIVATE HEALTH INSURANCE | Admitting: Gastroenterology

## 2014-10-13 DIAGNOSIS — N183 Chronic kidney disease, stage 3 unspecified: Secondary | ICD-10-CM | POA: Insufficient documentation

## 2014-10-13 DIAGNOSIS — N1832 Chronic kidney disease, stage 3b: Secondary | ICD-10-CM

## 2014-10-13 DIAGNOSIS — N184 Chronic kidney disease, stage 4 (severe): Secondary | ICD-10-CM | POA: Insufficient documentation

## 2014-10-13 HISTORY — DX: Chronic kidney disease, stage 3b: N18.32

## 2014-10-13 HISTORY — DX: Chronic kidney disease, stage 3 unspecified: N18.30

## 2014-10-29 ENCOUNTER — Emergency Department (HOSPITAL_COMMUNITY)
Admission: EM | Admit: 2014-10-29 | Discharge: 2014-10-29 | Disposition: A | Discharge status: Home / Self Care | Payer: Medicare HMO | Attending: Emergency Medicine | Admitting: Emergency Medicine

## 2014-10-29 ENCOUNTER — Encounter (HOSPITAL_COMMUNITY): Payer: Self-pay | Admitting: Emergency Medicine

## 2014-10-29 DIAGNOSIS — Z79899 Other long term (current) drug therapy: Secondary | ICD-10-CM | POA: Diagnosis not present

## 2014-10-29 DIAGNOSIS — T83091A Other mechanical complication of indwelling urethral catheter, initial encounter: Secondary | ICD-10-CM

## 2014-10-29 DIAGNOSIS — I10 Essential (primary) hypertension: Secondary | ICD-10-CM | POA: Diagnosis not present

## 2014-10-29 DIAGNOSIS — Z8546 Personal history of malignant neoplasm of prostate: Secondary | ICD-10-CM | POA: Insufficient documentation

## 2014-10-29 DIAGNOSIS — Z87891 Personal history of nicotine dependence: Secondary | ICD-10-CM | POA: Insufficient documentation

## 2014-10-29 DIAGNOSIS — T83098A Other mechanical complication of other indwelling urethral catheter, initial encounter: Secondary | ICD-10-CM | POA: Diagnosis present

## 2014-10-29 DIAGNOSIS — Z8619 Personal history of other infectious and parasitic diseases: Secondary | ICD-10-CM | POA: Diagnosis not present

## 2014-10-29 DIAGNOSIS — Y846 Urinary catheterization as the cause of abnormal reaction of the patient, or of later complication, without mention of misadventure at the time of the procedure: Secondary | ICD-10-CM | POA: Insufficient documentation

## 2014-10-29 LAB — URINALYSIS, ROUTINE W REFLEX MICROSCOPIC
Bilirubin Urine: NEGATIVE
Glucose, UA: NEGATIVE mg/dL
Ketones, ur: NEGATIVE mg/dL
Nitrite: NEGATIVE
Protein, ur: 30 mg/dL — AB
Specific Gravity, Urine: 1.012 (ref 1.005–1.030)
Urobilinogen, UA: 1 mg/dL (ref 0.0–1.0)
pH: 6.5 (ref 5.0–8.0)

## 2014-10-29 LAB — URINE MICROSCOPIC-ADD ON

## 2014-10-29 NOTE — ED Notes (Signed)
System downtime - Prior documentation transfer - Pt states his catheter is not draining properly causing him to have a lot of pressure.  Pt states he had an appt on Monday but was unable to go.  Pt has prostate cancer that is blocking the bladder.

## 2014-10-29 NOTE — ED Provider Notes (Signed)
CSN: [id number]     Arrival date & time 10/29/14  1224 History   First MD Initiated Contact with Patient 10/29/14 [phone]     No chief complaint on file.    (Consider location/radiation/quality/duration/timing/severity/associated sxs/prior Treatment) HPI   63 year old male with obstructive Foley catheter. Patient has a past history of prostate cancer. Chronic indwelling Foley. Decreased urinary output to essentially nothing over the past 3-4 hours. Increasing suprapubic discomfort. Catheter was last changed approximately 5 weeks ago. No other acute complaints. No recent hematuria or significant sediment noted in bag. No fevers or chills. No nausea vomiting.  Past Medical History  Diagnosis Date  . Hypertension   . Cancer 1997    prostate  . Hepatitis C    Past Surgical History  Procedure Laterality Date  . Cervical disc surgery      neck  . Nose surgery      in high school-nasal fracturex2  . Achilles tendon repair     Family History  Problem Relation Age of Onset  . Heart disease Mother   . Obesity Mother   . Cervical cancer Mother   . Heart disease Father   . Diabetes Father   . ALS Sister   . Colon cancer Neg Hx    History  Substance Use Topics  . Smoking status: Former Research scientist (life sciences)  . Smokeless tobacco: Never Used  . Alcohol Use: No    Review of Systems  All systems reviewed and negative, other than as noted in HPI.   Allergies  Review of patient's allergies indicates no known allergies.  Home Medications   Prior to Admission medications   Medication Sig Start Date End Date Taking? Authorizing Provider  acetaminophen (TYLENOL) 500 MG tablet Take 500-1,000 mg by mouth every 6 (six) hours as needed for pain.     Historical Provider, MD  doxazosin (CARDURA) 4 MG tablet Take 4 mg by mouth at bedtime.    Historical Provider, MD  losartan (COZAAR) 50 MG tablet Take 50 mg by mouth every morning.     Historical Provider, MD  MOVIPREP 100 G SOLR Take 1 kit (200 g total)  by mouth once. moviprep as directed. No substitutions 09/08/14   Irene Shipper, MD   There were no vitals taken for this visit. Physical Exam  Constitutional: He appears well-developed and well-nourished. No distress.  Standing beside bed. Appears somewhat uncomfortable.   HENT:  Head: Normocephalic and atraumatic.  Eyes: Conjunctivae are normal. Right eye exhibits no discharge. Left eye exhibits no discharge.  Neck: Neck supple.  Cardiovascular: Normal rate, regular rhythm and normal heart sounds.  Exam reveals no gallop and no friction rub.   No murmur heard. Pulmonary/Chest: Effort normal and breath sounds normal. No respiratory distress.  Abdominal: Soft. He exhibits no distension. There is tenderness.  Suprapubic tenderness. Bladder mildly distended.   Genitourinary:  Foley to leg bag. Scant clear/yellow urine in leg bag.   Musculoskeletal: He exhibits no edema or tenderness.  Neurological: He is alert.  Skin: Skin is warm and dry.  Psychiatric: He has a normal mood and affect. His behavior is normal. Thought content normal.  Nursing note and vitals reviewed.   ED Course  Procedures (including critical care time) Labs Review Labs Reviewed - No data to display  Imaging Review No results found.   EKG Interpretation None       MDM   Final diagnoses:  Obstruction of Foley catheter, initial encounter    63yM with obstructed foley. Replaced.  Good urine flow. Resolution of symptoms. UA results noted. Chronic indwelling cathter which was now changed. W/o other acute complaints, abx deferred.     Virgel Manifold, MD 10/29/14 [phone]

## 2014-10-29 NOTE — ED Notes (Signed)
Pt cath changed.  Pt states this cath was placed 5-6 weeks ago.  Documentation of this cath not present.  All old documented caths removed.

## 2014-10-31 LAB — URINE CULTURE: Colony Count: >=100000

## 2014-11-02 ENCOUNTER — Telehealth (HOSPITAL_COMMUNITY): Payer: Self-pay

## 2014-11-02 NOTE — Telephone Encounter (Signed)
Post ED Visit - Positive Culture Follow-up  Culture report reviewed by antimicrobial stewardship pharmacist: []  Wes [place], Pharm.D., BCPS []  Heide Guile, Pharm.D., BCPS [x]  Alycia Rossetti, Pharm.D., BCPS []  [place], [place].D., BCPS, AAHIVP []  Legrand Como, Pharm.D., BCPS, AAHIVP []  Elicia Lamp, Pharm.D.   Positive Urine culture, >/= 100,000 colonies -> Klebsiella Oxytoca Chart reviewed by Katherine Roan PA No treatment needed  Casey Greene 11/02/2014, 5:42 AM

## 2015-01-24 ENCOUNTER — Encounter (HOSPITAL_COMMUNITY): Payer: Self-pay | Admitting: Emergency Medicine

## 2015-01-24 ENCOUNTER — Emergency Department (HOSPITAL_COMMUNITY)
Admission: EM | Admit: 2015-01-24 | Discharge: 2015-01-24 | Disposition: A | Discharge status: Home / Self Care | Payer: Medicare HMO | Attending: Emergency Medicine | Admitting: Emergency Medicine

## 2015-01-24 DIAGNOSIS — Z87448 Personal history of other diseases of urinary system: Secondary | ICD-10-CM | POA: Insufficient documentation

## 2015-01-24 DIAGNOSIS — I10 Essential (primary) hypertension: Secondary | ICD-10-CM | POA: Insufficient documentation

## 2015-01-24 DIAGNOSIS — Z8546 Personal history of malignant neoplasm of prostate: Secondary | ICD-10-CM | POA: Diagnosis not present

## 2015-01-24 DIAGNOSIS — T83098D Other mechanical complication of other indwelling urethral catheter, subsequent encounter: Secondary | ICD-10-CM | POA: Diagnosis present

## 2015-01-24 DIAGNOSIS — Y846 Urinary catheterization as the cause of abnormal reaction of the patient, or of later complication, without mention of misadventure at the time of the procedure: Secondary | ICD-10-CM | POA: Insufficient documentation

## 2015-01-24 DIAGNOSIS — Z87891 Personal history of nicotine dependence: Secondary | ICD-10-CM | POA: Insufficient documentation

## 2015-01-24 DIAGNOSIS — Z79899 Other long term (current) drug therapy: Secondary | ICD-10-CM | POA: Diagnosis not present

## 2015-01-24 DIAGNOSIS — Z8619 Personal history of other infectious and parasitic diseases: Secondary | ICD-10-CM | POA: Diagnosis not present

## 2015-01-24 DIAGNOSIS — T83091D Other mechanical complication of indwelling urethral catheter, subsequent encounter: Secondary | ICD-10-CM

## 2015-01-24 MED ORDER — LIDOCAINE HCL 2 % EX GEL
1.0000 "application " | Freq: Once | CUTANEOUS | Status: AC
  Administered 2015-01-24: 1 via URETHRAL
  Filled 2015-01-24: qty 10

## 2015-01-24 NOTE — ED Notes (Signed)
Pt reports indwelling foley catheter stopped draining last night. Pt has had cath in place x 1 year, replaced 2 weeks ago. No bleeding per patient. Pt diaphoretic, pacing

## 2015-01-24 NOTE — ED Provider Notes (Signed)
CSN: [id number]     Arrival date & time 01/24/15  0308 History   First MD Initiated Contact with Patient 01/24/15 0355     Chief Complaint  Patient presents with  . Foley not draining      (Consider location/radiation/quality/duration/timing/severity/associated sxs/prior Treatment) HPI 64 year old male presents to emergency department from home with complaint of lower abdominal distention and pain with blockage of his Foley.  Patient has chronic indwelling catheter secondary to prostate enlargement.  Patient had change of his catheter replaced 2 weeks ago.  No fevers or chills. Past Medical History  Diagnosis Date  . Hypertension   . Cancer 1997    prostate  . Hepatitis C    Past Surgical History  Procedure Laterality Date  . Cervical disc surgery      neck  . Nose surgery      in high school-nasal fracturex2  . Achilles tendon repair     Family History  Problem Relation Age of Onset  . Heart disease Mother   . Obesity Mother   . Cervical cancer Mother   . Heart disease Father   . Diabetes Father   . ALS Sister   . Colon cancer Neg Hx    History  Substance Use Topics  . Smoking status: Former Research scientist (life sciences)  . Smokeless tobacco: Never Used  . Alcohol Use: No    Review of Systems  See History of Present Illness; otherwise all other systems are reviewed and negative   Allergies  Review of patient's allergies indicates no known allergies.  Home Medications   Prior to Admission medications   Medication Sig Start Date End Date Taking? Authorizing Provider  losartan (COZAAR) 50 MG tablet Take 50 mg by mouth every morning.    Yes Historical Provider, MD  OVER THE COUNTER MEDICATION Take 1 tablet by mouth 2 (two) times daily. [place]   Yes Historical Provider, MD  Pediatric Multiple Vitamins (MULTIVITAMIN) LIQD Take 15 mLs by mouth daily.   Yes Historical Provider, MD  MOVIPREP 100 G SOLR Take 1 kit (200 g total) by mouth once. moviprep as directed. No substitutions Patient  not taking: Reported on 10/29/2014 09/08/14   Irene Shipper, MD   BP 223/130 mmHg  Pulse 95  Temp(Src)   Resp 18  SpO2 100% Physical Exam  Constitutional: He is oriented to person, place, and time. He appears well-developed and well-nourished.  HENT:  Head: Normocephalic and atraumatic.  Nose: Nose normal.  Mouth/Throat: Oropharynx is clear and moist.  Eyes: Conjunctivae and EOM are normal. Pupils are equal, round, and reactive to light.  Neck: Normal range of motion. Neck supple. No JVD present. No tracheal deviation present. No thyromegaly present.  Cardiovascular: Normal rate, regular rhythm, normal heart sounds and intact distal pulses.  Exam reveals no gallop and no friction rub.   No murmur heard. Pulmonary/Chest: Effort normal and breath sounds normal. No stridor. No respiratory distress. He has no wheezes. He has no rales. He exhibits no tenderness.  Abdominal: Soft. Bowel sounds are normal. He exhibits no distension and no mass. There is no tenderness. There is no rebound and no guarding.  Musculoskeletal: Normal range of motion. He exhibits no edema or tenderness.  Lymphadenopathy:    He has no cervical adenopathy.  Neurological: He is alert and oriented to person, place, and time. He displays normal reflexes. He exhibits normal muscle tone. Coordination normal.  Skin: Skin is warm and dry. No rash noted. No erythema. No pallor.  Psychiatric: He  has a normal mood and affect. His behavior is normal. Judgment and thought content normal.  Nursing note and vitals reviewed.   ED Course  Procedures (including critical care time) Labs Review Labs Reviewed - No data to display  Imaging Review No results found.   EKG Interpretation None      MDM   Final diagnoses:  Complication, blocked Foley catheter, subsequent encounter   64 year old male with urinary retention despite catheter.  Sig staff unable to flush, but after placement of Foley, patient is draining clear urine.   He feels much better and is without complaint.  Kalman Drape, MD 01/24/15 [phone]

## 2015-02-27 ENCOUNTER — Emergency Department (HOSPITAL_COMMUNITY)
Admission: EM | Admit: 2015-02-27 | Discharge: 2015-02-27 | Disposition: A | Discharge status: Home / Self Care | Payer: Medicare HMO | Attending: Emergency Medicine | Admitting: Emergency Medicine

## 2015-02-27 ENCOUNTER — Encounter (HOSPITAL_COMMUNITY): Payer: Self-pay | Admitting: Emergency Medicine

## 2015-02-27 DIAGNOSIS — Z8546 Personal history of malignant neoplasm of prostate: Secondary | ICD-10-CM | POA: Insufficient documentation

## 2015-02-27 DIAGNOSIS — Z79899 Other long term (current) drug therapy: Secondary | ICD-10-CM | POA: Insufficient documentation

## 2015-02-27 DIAGNOSIS — Z8619 Personal history of other infectious and parasitic diseases: Secondary | ICD-10-CM | POA: Diagnosis not present

## 2015-02-27 DIAGNOSIS — T83098A Other mechanical complication of other indwelling urethral catheter, initial encounter: Secondary | ICD-10-CM | POA: Insufficient documentation

## 2015-02-27 DIAGNOSIS — Y846 Urinary catheterization as the cause of abnormal reaction of the patient, or of later complication, without mention of misadventure at the time of the procedure: Secondary | ICD-10-CM | POA: Insufficient documentation

## 2015-02-27 DIAGNOSIS — I10 Essential (primary) hypertension: Secondary | ICD-10-CM | POA: Insufficient documentation

## 2015-02-27 DIAGNOSIS — R3919 Other difficulties with micturition: Secondary | ICD-10-CM | POA: Diagnosis present

## 2015-02-27 DIAGNOSIS — T83091A Other mechanical complication of indwelling urethral catheter, initial encounter: Secondary | ICD-10-CM

## 2015-02-27 NOTE — ED Notes (Addendum)
Pt from home c/o foley catheter being clogged. Last able to drain urine from bag at 0330. He reports this foley has been in place for 1 month. He denies visibly seeing blood in urine output. He reports pain 10/10. Pt presents to ED with 30F 42mL balloon foley catheter.

## 2015-02-27 NOTE — ED Notes (Signed)
New catheter placed without difficulties. 36F 27mL balloon. Immediate cloudy urine output with small blood clots present. Pt now feeling very much relieved and appears more relaxed.

## 2015-02-27 NOTE — ED Notes (Signed)
Unable to flush current foley catheter. Catheter removed and will place new one.

## 2015-02-27 NOTE — ED Provider Notes (Signed)
CSN: [id number]     Arrival date & time 02/27/15  0709 History   First MD Initiated Contact with Patient 02/27/15 0730     Chief Complaint  Patient presents with  . catheter issues      (Consider location/radiation/quality/duration/timing/severity/associated sxs/prior Treatment) HPI  64 year old male with history of chronic indwelling catheter secondary to prostate enlargement due to cancer presenting complaining of low abdominal pain and urinary retention secondary to a clogged Foley catheter. Patient reports he notice decrease urine output at approximately 3:30 this morning after he developed pain to his lower abdomen. Pain is been persistent, no urine output since. He denies seeing any visible blood with urine output. Pain is currently 10 out of 10, persistent. Report last Foley replacement was a month ago and he is due to have his Foley changed tomorrow at [facility].  Report of prior episode of clogged Foley last month. Denies any fever, chills, nausea vomiting diarrhea.   Past Medical History  Diagnosis Date  . Hypertension   . Cancer 1997    prostate  . Hepatitis C    Past Surgical History  Procedure Laterality Date  . Cervical disc surgery      neck  . Nose surgery      in high school-nasal fracturex2  . Achilles tendon repair     Family History  Problem Relation Age of Onset  . Heart disease Mother   . Obesity Mother   . Cervical cancer Mother   . Heart disease Father   . Diabetes Father   . ALS Sister   . Colon cancer Neg Hx    History  Substance Use Topics  . Smoking status: Former Research scientist (life sciences)  . Smokeless tobacco: Never Used  . Alcohol Use: No    Review of Systems  Gastrointestinal: Positive for abdominal pain.  Genitourinary: Positive for difficulty urinating.  Skin: Negative for rash and wound.  All other systems reviewed and are negative.     Allergies  Review of patient's allergies indicates no known allergies.  Home Medications   Prior to  Admission medications   Medication Sig Start Date End Date Taking? Authorizing Provider  losartan (COZAAR) 50 MG tablet Take 50 mg by mouth every morning.     Historical Provider, MD  MOVIPREP 100 G SOLR Take 1 kit (200 g total) by mouth once. moviprep as directed. No substitutions Patient not taking: Reported on 10/29/2014 09/08/14   Irene Shipper, MD  OVER THE COUNTER MEDICATION Take 1 tablet by mouth 2 (two) times daily. [place] Provider, MD  Pediatric Multiple Vitamins (MULTIVITAMIN) LIQD Take 15 mLs by mouth daily.    Historical Provider, MD   BP 148/90 mmHg  Pulse 114  Temp(Src) 98.7 F (37.1 C) (Oral)  Resp 20  SpO2 98% Physical Exam  Constitutional: He appears well-developed and well-nourished. No distress.  African-American male, uncomfortable, diaphoretic  HENT:  Head: Atraumatic.  Eyes: Conjunctivae are normal.  Neck: Normal range of motion. Neck supple.  Abdominal: There is tenderness (Moderate suprapubic tenderness on palpation with a distended bladder).  Genitourinary:  Indwelling Foley catheter in place, unable to irrigate.  Neurological: He is alert.  Skin: No rash noted.  Psychiatric: He has a normal mood and affect.  Nursing note and vitals reviewed.   ED Course  Procedures (including critical care time)  Patient presents with urinary retention secondary to a clogged Foley catheter.  Unable to irrigate foley.  Will exchange to a new foley catheter.  9:05 AM Foley catheter has been exchanged to a 86F and pt able to put out adequate urine and felt much better.  He is stable for discharge with outpt f/u to urology as needed.    Labs Review Labs Reviewed - No data to display  Imaging Review No results found.   EKG Interpretation None      MDM   Final diagnoses:  Obstructed Foley catheter, initial encounter    BP 170/111 mmHg  Pulse 84  Temp(Src) 98.7 F (37.1 C) (Oral)  Resp 16  SpO2 96%     Domenic Moras, [name] 02/27/15  2751  Virgel Manifold, MD 02/27/15 [phone]

## 2015-02-27 NOTE — Discharge Instructions (Signed)
Your foley catheter was exchanged.  Follow up with your urologist as needed.  Return if you have any complications or if you have other concerns.

## 2015-03-12 ENCOUNTER — Encounter (HOSPITAL_COMMUNITY): Payer: Self-pay | Admitting: Emergency Medicine

## 2015-03-12 ENCOUNTER — Emergency Department (HOSPITAL_COMMUNITY)
Admission: EM | Admit: 2015-03-12 | Discharge: 2015-03-12 | Disposition: A | Discharge status: Home / Self Care | Payer: Medicare HMO | Attending: Emergency Medicine | Admitting: Emergency Medicine

## 2015-03-12 DIAGNOSIS — Z79899 Other long term (current) drug therapy: Secondary | ICD-10-CM | POA: Insufficient documentation

## 2015-03-12 DIAGNOSIS — Z8546 Personal history of malignant neoplasm of prostate: Secondary | ICD-10-CM | POA: Insufficient documentation

## 2015-03-12 DIAGNOSIS — Z8619 Personal history of other infectious and parasitic diseases: Secondary | ICD-10-CM | POA: Insufficient documentation

## 2015-03-12 DIAGNOSIS — I10 Essential (primary) hypertension: Secondary | ICD-10-CM | POA: Diagnosis not present

## 2015-03-12 DIAGNOSIS — Z87891 Personal history of nicotine dependence: Secondary | ICD-10-CM | POA: Insufficient documentation

## 2015-03-12 DIAGNOSIS — Y846 Urinary catheterization as the cause of abnormal reaction of the patient, or of later complication, without mention of misadventure at the time of the procedure: Secondary | ICD-10-CM | POA: Insufficient documentation

## 2015-03-12 DIAGNOSIS — T83098A Other mechanical complication of other indwelling urethral catheter, initial encounter: Secondary | ICD-10-CM | POA: Insufficient documentation

## 2015-03-12 DIAGNOSIS — T839XXA Unspecified complication of genitourinary prosthetic device, implant and graft, initial encounter: Secondary | ICD-10-CM

## 2015-03-12 NOTE — ED Provider Notes (Signed)
CSN: [id number]     Arrival date & time 03/12/15  0847 History   First MD Initiated Contact with Patient 03/12/15 [phone]     Chief Complaint  Patient presents with  . catheter issues      (Consider location/radiation/quality/duration/timing/severity/associated sxs/prior Treatment) Patient is a 64 y.o. male presenting with male genitourinary complaint. The history is provided by the patient.  Male GU Problem Presenting symptoms: no dysuria   Presenting symptoms comment:  Foley catheter obstruction Context: spontaneously   Relieved by:  Nothing Worsened by:  Nothing tried Ineffective treatments:  None tried Associated symptoms: no abdominal pain, no diarrhea, no fever, no hematuria, no nausea and no vomiting   Risk factors comment:  Prostate enlargment   Past Medical History  Diagnosis Date  . Hypertension   . Cancer 1997    prostate  . Hepatitis C    Past Surgical History  Procedure Laterality Date  . Cervical disc surgery      neck  . Nose surgery      in high school-nasal fracturex2  . Achilles tendon repair     Family History  Problem Relation Age of Onset  . Heart disease Mother   . Obesity Mother   . Cervical cancer Mother   . Heart disease Father   . Diabetes Father   . ALS Sister   . Colon cancer Neg Hx    History  Substance Use Topics  . Smoking status: Former Research scientist (life sciences)  . Smokeless tobacco: Never Used  . Alcohol Use: No    Review of Systems  Constitutional: Negative for fever.  HENT: Negative for drooling and rhinorrhea.   Eyes: Negative for pain.  Respiratory: Negative for cough and shortness of breath.   Cardiovascular: Negative for chest pain and leg swelling.  Gastrointestinal: Negative for nausea, vomiting, abdominal pain and diarrhea.  Genitourinary: Negative for dysuria and hematuria.  Musculoskeletal: Negative for gait problem and neck pain.  Skin: Negative for color change.  Neurological: Negative for numbness and headaches.  Hematological:  Negative for adenopathy.  Psychiatric/Behavioral: Negative for behavioral problems.  All other systems reviewed and are negative.     Allergies  Amlodipine  Home Medications   Prior to Admission medications   Medication Sig Start Date End Date Taking? Authorizing Provider  acetaminophen (TYLENOL) 500 MG tablet Take 500 mg by mouth every 6 (six) hours as needed for mild pain.    Historical Provider, MD  losartan (COZAAR) 100 MG tablet Take 100 mg by mouth every morning.    Historical Provider, MD  MOVIPREP 100 G SOLR Take 1 kit (200 g total) by mouth once. moviprep as directed. No substitutions Patient not taking: Reported on 10/29/2014 09/08/14   Irene Shipper, MD  Pediatric Multiple Vitamins (MULTIVITAMIN) LIQD Take 15 mLs by mouth daily.    Historical Provider, MD   BP 170/80 mmHg  Pulse 104  Temp(Src) 97.8 F (36.6 C) (Oral)  Resp 20  SpO2 98% Physical Exam  Constitutional: He is oriented to person, place, and time. He appears well-developed and well-nourished.  HENT:  Head: Normocephalic and atraumatic.  Right Ear: External ear normal.  Left Ear: External ear normal.  Nose: Nose normal.  Mouth/Throat: Oropharynx is clear and moist. No oropharyngeal exudate.  Eyes: Conjunctivae and EOM are normal. Pupils are equal, round, and reactive to light.  Neck: Normal range of motion. Neck supple.  Cardiovascular: Normal rate, regular rhythm, normal heart sounds and intact distal pulses.  Exam reveals no gallop and  no friction rub.   No murmur heard. Pulmonary/Chest: Effort normal and breath sounds normal. No respiratory distress. He has no wheezes.  Abdominal: Soft. Bowel sounds are normal. He exhibits no distension. There is no tenderness. There is no rebound and no guarding.  Musculoskeletal: Normal range of motion. He exhibits no edema or tenderness.  Neurological: He is alert and oriented to person, place, and time.  Skin: Skin is warm and dry.  Psychiatric: He has a normal  mood and affect. His behavior is normal.  Nursing note and vitals reviewed.   ED Course  Procedures (including critical care time) Labs Review Labs Reviewed - No data to display  Imaging Review No results found.   EKG Interpretation None      MDM   Final diagnoses:  Foley catheter problem, initial encounter    9:30 AM 64 y.o. male w hx of Hep C, prostate enlargement related to cancer who presents with full obstruction. He has been seen here several times previously for an obstructed Foley. He notes that she started this morning around 8 AM after working overnight. We are able to flush the Foley on exam and appears to be draining urine. He is having symptomatic relief with this. Currently asymptomatic after drainage of the Foley. We'll monitor him briefly in the ER to make sure the Foley is draining appropriately.  10:30 AM: Pt continues to appear well. Foley draining. Likely obstructed w/ sediment. Urine yellow and clear. I have discussed the diagnosis/risks/treatment options with the patient and believe the pt to be eligible for discharge home to follow-up with his Urologist/pcp as needed. We also discussed returning to the ED immediately if new or worsening sx occur. We discussed the sx which are most concerning (e.g., further foley complications) that necessitate immediate return. Medications administered to the patient during their visit and any new prescriptions provided to the patient are listed below.  Medications given during this visit Medications - No data to display  New Prescriptions   No medications on file     Pamella Pert, MD 03/12/15 1032

## 2015-03-12 NOTE — ED Notes (Addendum)
Pt from home c/o foley catheter clogged since this am. He reports blood coming around catheter. Was seen on 4/3 for same and new foley was placed by this RN.

## 2015-03-12 NOTE — ED Notes (Signed)
Flushed foley catheter with 20 mL of NS x 2 and cloudy urine returned. No clots present. Foley flushes easily. 200 Urine returned noted. Standard drainage bag placed. Will continue to monitor patient.

## 2015-06-26 ENCOUNTER — Emergency Department (HOSPITAL_COMMUNITY)
Admission: EM | Admit: 2015-06-26 | Discharge: 2015-06-26 | Disposition: A | Discharge status: Home / Self Care | Payer: Medicare HMO | Attending: Emergency Medicine | Admitting: Emergency Medicine

## 2015-06-26 ENCOUNTER — Encounter (HOSPITAL_COMMUNITY): Payer: Self-pay | Admitting: Emergency Medicine

## 2015-06-26 DIAGNOSIS — Z79899 Other long term (current) drug therapy: Secondary | ICD-10-CM | POA: Diagnosis not present

## 2015-06-26 DIAGNOSIS — N390 Urinary tract infection, site not specified: Secondary | ICD-10-CM | POA: Insufficient documentation

## 2015-06-26 DIAGNOSIS — R339 Retention of urine, unspecified: Secondary | ICD-10-CM | POA: Diagnosis present

## 2015-06-26 DIAGNOSIS — I10 Essential (primary) hypertension: Secondary | ICD-10-CM | POA: Diagnosis not present

## 2015-06-26 DIAGNOSIS — Z8546 Personal history of malignant neoplasm of prostate: Secondary | ICD-10-CM | POA: Insufficient documentation

## 2015-06-26 DIAGNOSIS — Y846 Urinary catheterization as the cause of abnormal reaction of the patient, or of later complication, without mention of misadventure at the time of the procedure: Secondary | ICD-10-CM | POA: Diagnosis not present

## 2015-06-26 DIAGNOSIS — Z8619 Personal history of other infectious and parasitic diseases: Secondary | ICD-10-CM | POA: Diagnosis not present

## 2015-06-26 DIAGNOSIS — T83098A Other mechanical complication of other indwelling urethral catheter, initial encounter: Secondary | ICD-10-CM | POA: Diagnosis not present

## 2015-06-26 DIAGNOSIS — Z87891 Personal history of nicotine dependence: Secondary | ICD-10-CM | POA: Diagnosis not present

## 2015-06-26 DIAGNOSIS — T85698A Other mechanical complication of other specified internal prosthetic devices, implants and grafts, initial encounter: Secondary | ICD-10-CM

## 2015-06-26 LAB — URINE MICROSCOPIC-ADD ON

## 2015-06-26 LAB — URINALYSIS, ROUTINE W REFLEX MICROSCOPIC
Bilirubin Urine: NEGATIVE
GLUCOSE, UA: NEGATIVE mg/dL
KETONES UR: NEGATIVE mg/dL
NITRITE: POSITIVE — AB
Protein, ur: 30 mg/dL — AB
Specific Gravity, Urine: 1.009 (ref 1.005–1.030)
Urobilinogen, UA: 0.2 mg/dL (ref 0.0–1.0)
pH: 7 (ref 5.0–8.0)

## 2015-06-26 MED ORDER — SULFAMETHOXAZOLE-TRIMETHOPRIM 800-160 MG PO TABS: 1.0000 | ORAL_TABLET | Freq: Two times a day (BID) | ORAL | Status: AC

## 2015-06-26 NOTE — ED Notes (Addendum)
Pt from home c/o urinary retention x 5 hrs. Catheter in place with minimal output. Pt extremely uncomfortable.

## 2015-06-26 NOTE — Discharge Instructions (Signed)
Please take your antibiotics as directed. Follow-up with your primary care for further evaluation and management of her symptoms. Return to ED for any worsening symptoms.  Catheter-Associated Urinary Tract Infection FAQs WHAT IS "CATHETER-ASSOCIATED" URINARY TRACT INFECTION? A urinary tract infection (also called "UTI") is an infection in the urinary system, which includes the bladder (which stores the urine) and the kidneys (which filter the blood to make urine). Germs (for example, bacteria or yeasts) do not normally live in these areas; but if germs are introduced, an infection can occur. If you have a urinary catheter, germs can travel along the catheter and cause an infection in your bladder or your kidney; in that case it is called a catheter-associated urinary tract infection (or "CA-UTI").  WHAT IS A URINARY CATHETER? A urinary catheter is a thin tube placed in the bladder to drain urine. Urine drains through the tube into a bag that collects the urine. A urinary  catheter may be used:  If you are not able to urinate on your own.  To measure the amount of urine that you make, for example, during intensive care.  During and after some types of surgery.  During some tests of the kidneys and bladder . People with urinary catheters have a much higher chance of getting a urinary tract infection than people who don't have a catheter. HOW DO I GET A CATHETER-ASSOCIATED URINARY TRACT INFECTION (CA-UTI)? If germs enter the urinary tract, they may cause an infection. Many of the germs that cause a catheter-associated urinary tract infection are common germs found in your intestines that do not usually cause an infection there. Germs can enter the urinary tract when the catheter is being put in or while the catheter remains in the bladder.  WHAT ARE THE SYMPTOMS OF A URINARY TRACT INFECTION?  Some of the common symptoms of a urinary tract infection are:  Burning or pain in the lower abdomen (that  is, below the stomach).  Fever.  Bloody urine may be a sign of infection, but is also caused by other problems .  Burning during urination or an increase in the frequency of urination after the catheter is removed. Sometimes people with catheter-associated urinary tract infections do not have these symptoms of infection. CAN CATHETER-ASSOCIATED URINARY TRACT INFECTIONS BE TREATED? Yes, most catheter-associated urinary tract infections can be treated with antibiotics and removal or change of the catheter. Your doctor will determine which antibiotic is best for you.  WHAT ARE SOME OF THE THINGS THAT HOSPITALS ARE DOING TO PREVENT CATHETER-ASSOCIATED URINARY TRACT INFECTIONS? To prevent urinary tract infections, doctors and nurses take the following actions.  Catheter insertion  Catheters are put in only when necessary and they are removed as soon as possible.  Only properly trained persons insert catheters using sterile ("clean") technique.  The skin in the area where the catheter will be inserted is cleaned before inserting the catheter.  Other methods to drain the urine are sometimes used, such as:  External catheters in men (these look like condoms and are placed over the penis rather than into the penis)  Putting a temporary catheter in to drain the urine and removing it right away. This is called intermittent urethral catheterization. Catheter care  Healthcare providers clean their hands by washing them with soap and water or using an alcohol-based hand rub before and after touching your catheter.  If you do not see your providers clean their hands, please ask them to do so.  Avoid disconnecting the catheter  and drain tube. This helps to prevent germs from getting into the catheter tube.  The catheter is secured to the leg to prevent pulling on the catheter.  Avoid twisting or kinking the catheter.  Keep the bag lower than the bladder to prevent urine from backflowing to the  bladder.  Empty the bag regularly. The drainage spout should not touch anything while emptying the bag. WHAT CAN I DO TO HELP PREVENT CATHETER-ASSOCIATED URINARY TRACT INFECTIONS IF I HAVE A CATHETER?  Always clean your hands before and after doing catheter care.  Always keep your urine bag below the level of your bladder.  Do not tug or pull on the tubing.  Do not twist or kink the catheter tubing.  Ask your healthcare provider each day if you still need the catheter. WHAT DO I NEED TO DO WHEN I [place]?  If you will be going home with a catheter, your doctor or nurse should explain everything you need to know about taking care of the catheter. Make sure you understand how to care for it before you leave the hospital.  If you develop any of the symptoms of a urinary tract infection, such as burning or pain in the lower abdomen, fever, or an increase in the frequency of urination, contact your doctor or nurse immediately.  Before you go home, make sure you know who to contact if you have questions or problems after you get home. If you have questions, please ask your doctor or nurse. Developed and co-sponsored by [employer] for [place] [phone]); Infectious Diseases Society of [place] (IDSA); The [place]; Association for Professionals in Infection Control and Epidemiology (APIC); Center for Disease Control (CDC); and The Joint Commission Document Released: 08/06/2012 Document Reviewed: 08/06/2012 [facility] Patient Information 2015 [place]. This information is not intended to replace advice given to you by your health care provider. Make sure you discuss any questions you have with your health care provider.

## 2015-06-26 NOTE — ED Notes (Addendum)
Pt with f/c with 122ml output of clear yellow urine. Pt denies abd pain. Abd nondistended.

## 2015-06-26 NOTE — ED Notes (Signed)
Awake. Verbally responsive. A/O x4. Resp even and unlabored. No audible adventitious breath sounds noted. ABC's intact.  

## 2015-06-26 NOTE — ED Provider Notes (Signed)
CSN: [id number]     Arrival date & time 06/26/15  0534 History   First MD Initiated Contact with Patient 06/26/15 [phone]     Chief Complaint  Patient presents with  . Urinary Retention     (Consider location/radiation/quality/duration/timing/severity/associated sxs/prior Treatment) HPI Casey Greene is a 64 y.o. male with chronic indwelling catheter secondary to prostate cancer comes in for evaluation of catheter obstruction. Patient states at approximately 3 AM this morning he began to notice his catheter was not draining. He reports associated abdominal discomfort. Denies any fevers, chills, nausea or vomiting, hematuria. No interventions tried to improve symptoms. Nothing seems to make it better or worse. Denies any other medical problems at this time. At the time of my interview, patient catheter already replaced and abdominal discomfort has resolved.  Past Medical History  Diagnosis Date  . Hypertension   . Cancer 1997    prostate  . Hepatitis C    Past Surgical History  Procedure Laterality Date  . Cervical disc surgery      neck  . Nose surgery      in high school-nasal fracturex2  . Achilles tendon repair     Family History  Problem Relation Age of Onset  . Heart disease Mother   . Obesity Mother   . Cervical cancer Mother   . Heart disease Father   . Diabetes Father   . ALS Sister   . Colon cancer Neg Hx    History  Substance Use Topics  . Smoking status: Former Research scientist (life sciences)  . Smokeless tobacco: Never Used  . Alcohol Use: No    Review of Systems A 10 point review of systems was completed and was negative except for pertinent positives and negatives as mentioned in the history of present illness     Allergies  Amlodipine  Home Medications   Prior to Admission medications   Medication Sig Start Date End Date Taking? Authorizing Provider  acetaminophen (TYLENOL) 500 MG tablet Take 500 mg by mouth every 6 (six) hours as needed for mild pain.   Yes Historical  Provider, MD  losartan (COZAAR) 100 MG tablet Take 100 mg by mouth every morning.   Yes Historical Provider, MD  MOVIPREP 100 G SOLR Take 1 kit (200 g total) by mouth once. moviprep as directed. No substitutions Patient not taking: Reported on 10/29/2014 09/08/14   Irene Shipper, MD  OVER THE COUNTER MEDICATION Take 1 tablet by mouth daily. GANO herbal supplement    Historical Provider, MD  Pediatric Multiple Vitamins (MULTIVITAMIN) LIQD Take 15 mLs by mouth daily.    Historical Provider, MD  sulfamethoxazole-trimethoprim (BACTRIM DS,SEPTRA DS) 800-160 MG per tablet Take 1 tablet by mouth 2 (two) times daily. 06/26/15 07/03/15  Nivek Powley, [name]   BP 180/100 mmHg  Pulse 70  Temp(Src)   Resp 22  SpO2 95% Physical Exam  Constitutional: He is oriented to person, place, and time. He appears well-developed and well-nourished.  HENT:  Head: Normocephalic and atraumatic.  Mouth/Throat: Oropharynx is clear and moist.  Eyes: Conjunctivae are normal. Pupils are equal, round, and reactive to light. Right eye exhibits no discharge. Left eye exhibits no discharge. No scleral icterus.  Neck: Neck supple.  Cardiovascular: Normal rate, regular rhythm and normal heart sounds.   Pulmonary/Chest: Effort normal and breath sounds normal. No respiratory distress. He has no wheezes. He has no rales.  Abdominal: Soft. There is no tenderness.  Genitourinary:  New Foley placed and is draining well. No evidence of  infection or skin breakdown around the urethral meatus. No other signs of infection or abnormality.  Musculoskeletal: He exhibits no tenderness.  Neurological: He is alert and oriented to person, place, and time.  Cranial Nerves II-XII grossly intact  Skin: Skin is warm and dry. No rash noted.  Psychiatric: He has a normal mood and affect.  Nursing note and vitals reviewed.   ED Course  Procedures (including critical care time) Labs Review Labs Reviewed  URINALYSIS, ROUTINE W REFLEX MICROSCOPIC  (NOT AT [facility]) - Abnormal; Notable for the following:    APPearance CLOUDY (*)    Hgb urine dipstick LARGE (*)    Protein, ur 30 (*)    Nitrite POSITIVE (*)    Leukocytes, UA LARGE (*)    All other components within normal limits  URINE MICROSCOPIC-ADD ON - Abnormal; Notable for the following:    Bacteria, UA MANY (*)    All other components within normal limits  URINE CULTURE    Imaging Review No results found.   EKG Interpretation None     Meds given in ED:  Medications - No data to display  New Prescriptions   SULFAMETHOXAZOLE-TRIMETHOPRIM (BACTRIM DS,SEPTRA DS) 800-160 MG PER TABLET    Take 1 tablet by mouth 2 (two) times daily.   Filed Vitals:   06/26/15 0540 06/26/15 0730  BP: 180/100   Pulse: 70   Resp: 22   SpO2: 100% 95%    MDM  Vitals stable - WNL -afebrile, check patient's temperature in the room orally at 98.73F Pt resting comfortably in ED. GCS 15 and appears to be mentating at baseline. PE--physical exam is grossly benign. Urine catheter change successfully and now is draining well. Labwork--evidence of nitrite positive UTI. Otherwise not concerning. Most recent urine culture grew MCL and was susceptible to Bactrim. Obtained another urine culture today and we will initiate treatment with Bactrim. Patient stable, in good condition and is appropriate for discharge home to follow-up PCP. I discussed all relevant lab findings and imaging results with pt and they verbalized understanding. Discussed f/u with PCP within 48 hrs and return precautions, pt very amenable to plan. Prior to patient discharge, I discussed and reviewed this case with Dr. Tomi Bamberger   Final diagnoses:  UTI (lower urinary tract infection)  Obstruction of catheter, initial encounter        Comer Locket, [name] 06/26/15 1657  Rolland Porter, MD 07/05/15 9038

## 2015-06-26 NOTE — ED Notes (Signed)
F/C bag connected to foley via [place], ED tech. Pt tolerated well.

## 2015-06-27 LAB — URINE CULTURE

## 2015-08-13 ENCOUNTER — Encounter (HOSPITAL_COMMUNITY): Payer: Self-pay

## 2015-08-13 ENCOUNTER — Emergency Department (HOSPITAL_COMMUNITY)
Admission: EM | Admit: 2015-08-13 | Discharge: 2015-08-13 | Disposition: A | Discharge status: Home / Self Care | Payer: Medicare HMO | Attending: Emergency Medicine | Admitting: Emergency Medicine

## 2015-08-13 DIAGNOSIS — Z8619 Personal history of other infectious and parasitic diseases: Secondary | ICD-10-CM | POA: Insufficient documentation

## 2015-08-13 DIAGNOSIS — R1030 Lower abdominal pain, unspecified: Secondary | ICD-10-CM | POA: Insufficient documentation

## 2015-08-13 DIAGNOSIS — R339 Retention of urine, unspecified: Secondary | ICD-10-CM

## 2015-08-13 DIAGNOSIS — I10 Essential (primary) hypertension: Secondary | ICD-10-CM | POA: Insufficient documentation

## 2015-08-13 DIAGNOSIS — Z87891 Personal history of nicotine dependence: Secondary | ICD-10-CM | POA: Insufficient documentation

## 2015-08-13 DIAGNOSIS — Z8546 Personal history of malignant neoplasm of prostate: Secondary | ICD-10-CM | POA: Diagnosis not present

## 2015-08-13 LAB — URINALYSIS, ROUTINE W REFLEX MICROSCOPIC
Bilirubin Urine: NEGATIVE
GLUCOSE, UA: NEGATIVE mg/dL
KETONES UR: NEGATIVE mg/dL
Nitrite: NEGATIVE
Protein, ur: 100 mg/dL — AB
Specific Gravity, Urine: 1.016 (ref 1.005–1.030)
Urobilinogen, UA: 0.2 mg/dL (ref 0.0–1.0)
pH: 7.5 (ref 5.0–8.0)

## 2015-08-13 LAB — URINE MICROSCOPIC-ADD ON

## 2015-08-13 NOTE — ED Notes (Signed)
He is now comfortable and thank [place] for replacing his catheter.  His urine is clear, amber and is flowing freely.

## 2015-08-13 NOTE — Discharge Instructions (Signed)
Acute Urinary Retention °Acute urinary retention is the temporary inability to urinate. °This is a common problem in older men. As men age their prostates become larger and block the flow of urine from the bladder. This is usually a problem that has come on gradually.  °HOME CARE INSTRUCTIONS °If you are sent home with a Foley catheter and a drainage system, you will need to discuss the best course of action with your health care provider. While the catheter is in, maintain a good intake of fluids. Keep the drainage bag emptied and lower than your catheter. This is so that contaminated urine will not flow back into your bladder, which could lead to a urinary tract infection. °There are two main types of drainage bags. One is a large bag that usually is used at night. It has a good capacity that will allow you to sleep through the night without having to empty it. The second type is called a leg bag. It has a smaller capacity, so it needs to be emptied more frequently. However, the main advantage is that it can be attached by a leg strap and can go underneath your clothing, allowing you the freedom to move about or leave your home. °Only take over-the-counter or prescription medicines for pain, discomfort, or fever as directed by your health care provider.  °SEEK MEDICAL CARE IF: °· You develop a low-grade fever. °· You experience spasms or leakage of urine with the spasms. °SEEK IMMEDIATE MEDICAL CARE IF:  °· You develop chills or fever. °· Your catheter stops draining urine. °· Your catheter falls out. °· You start to develop increased bleeding that does not respond to rest and increased fluid intake. °MAKE SURE YOU: °· Understand these instructions. °· Will watch your condition. °· Will get help right away if you are not doing well or get worse. °Document Released: 02/18/2001 Document Revised: 11/17/2013 Document Reviewed: 04/23/2013 °[employer]® Patient Information ©2015 [employer]. This information is not  intended to replace advice given to you by your health care provider. Make sure you discuss any questions you have with your health care provider. ° °

## 2015-08-13 NOTE — ED Provider Notes (Signed)
CSN: [id number]     Arrival date & time 08/13/15  2482 History   First MD Initiated Contact with Patient 08/13/15 [phone]     Chief Complaint  Patient presents with  . foley cath issues      (Consider location/radiation/quality/duration/timing/severity/associated sxs/prior Treatment) HPI Comments: Patient presents with urinary retention. He has a chronic indwelling Foley catheter secondary to prostate enlargement from prostate cancer. He states that he has it changed out every couple of months. He states the last was changed out about 3 weeks ago. He states he woke up about 3 AM and noticed that wasn't draining and it hasn't seemed to drain since that time. He has pressure in his lower abdomen. He denies any nausea or vomiting. There is no fevers or chills.   Past Medical History  Diagnosis Date  . Hypertension   . Cancer 1997    prostate  . Hepatitis C    Past Surgical History  Procedure Laterality Date  . Cervical disc surgery      neck  . Nose surgery      in high school-nasal fracturex2  . Achilles tendon repair     Family History  Problem Relation Age of Onset  . Heart disease Mother   . Obesity Mother   . Cervical cancer Mother   . Heart disease Father   . Diabetes Father   . ALS Sister   . Colon cancer Neg Hx    Social History  Substance Use Topics  . Smoking status: Former Research scientist (life sciences)  . Smokeless tobacco: Never Used  . Alcohol Use: No    Review of Systems  Constitutional: Negative for fever, chills, diaphoresis and fatigue.  HENT: Negative for congestion, rhinorrhea and sneezing.   Eyes: Negative.   Respiratory: Negative for cough, chest tightness and shortness of breath.   Cardiovascular: Negative for chest pain and leg swelling.  Gastrointestinal: Positive for abdominal pain. Negative for nausea, vomiting, diarrhea and blood in stool.  Genitourinary: Positive for difficulty urinating. Negative for frequency, hematuria and flank pain.  Musculoskeletal: Negative  for back pain and arthralgias.  Skin: Negative for rash.  Neurological: Negative for dizziness, speech difficulty, weakness, numbness and headaches.      Allergies  Amlodipine  Home Medications   Prior to Admission medications   Medication Sig Start Date End Date Taking? Authorizing Provider  acetaminophen (TYLENOL) 500 MG tablet Take 500 mg by mouth every 6 (six) hours as needed for mild pain.   Yes Historical Provider, MD  losartan (COZAAR) 100 MG tablet Take 100 mg by mouth every morning.   Yes Historical Provider, MD  MOVIPREP 100 G SOLR Take 1 kit (200 g total) by mouth once. moviprep as directed. No substitutions Patient not taking: Reported on 10/29/2014 09/08/14   Irene Shipper, MD   BP 173/108 mmHg  Pulse 72  Temp(Src) 98.7 F (37.1 C) (Oral)  Resp 18  SpO2 98% Physical Exam  Constitutional: He is oriented to person, place, and time. He appears well-developed and well-nourished.  HENT:  Head: Normocephalic and atraumatic.  Eyes: Pupils are equal, round, and reactive to light.  Neck: Normal range of motion. Neck supple.  Cardiovascular: Normal rate, regular rhythm and normal heart sounds.   Pulmonary/Chest: Effort normal and breath sounds normal. No respiratory distress. He has no wheezes. He has no rales. He exhibits no tenderness.  Abdominal: Soft. Bowel sounds are normal. There is tenderness (Moderate tenderness to suprapubic area). There is no rebound and no guarding.  Musculoskeletal: Normal range of motion. He exhibits no edema.  Lymphadenopathy:    He has no cervical adenopathy.  Neurological: He is alert and oriented to person, place, and time.  Skin: Skin is warm and dry. No rash noted.  Psychiatric: He has a normal mood and affect.    ED Course  Procedures (including critical care time) Labs Review Labs Reviewed  URINALYSIS, ROUTINE W REFLEX MICROSCOPIC (NOT AT [facility]) - Abnormal; Notable for the following:    APPearance CLOUDY (*)    Hgb urine dipstick  LARGE (*)    Protein, ur 100 (*)    Leukocytes, UA SMALL (*)    All other components within normal limits  URINE MICROSCOPIC-ADD ON - Abnormal; Notable for the following:    Bacteria, UA FEW (*)    All other components within normal limits  URINE CULTURE    Imaging Review No results found. I have personally reviewed and evaluated these images and lab results as part of my medical decision-making.   EKG Interpretation None      MDM   Final diagnoses:  Urinary retention    Patient presents with an obstruction of his Foley catheter. He's had a long-standing indwelling Foley catheter. The catheters are placed in the ED and is draining well. He feels much better. His urine does not indicate infection at this point. It was sent for culture. He will follow-up with his urologist as needed. His blood pressure is elevated we'll recheck this prior to discharge and I also encouraged him to have it rechecked by his primary care physician.    Malvin Johns, MD 08/13/15 1106

## 2015-08-13 NOTE — ED Notes (Signed)
Per pt, states he had cath placed 3 weeks ago for prostate issues-states not draining since 3 am

## 2015-08-15 LAB — URINE CULTURE

## 2015-09-07 ENCOUNTER — Other Ambulatory Visit: Payer: Self-pay | Admitting: Urology

## 2015-09-07 DIAGNOSIS — C61 Malignant neoplasm of prostate: Secondary | ICD-10-CM

## 2015-09-20 ENCOUNTER — Encounter (HOSPITAL_COMMUNITY)
Admission: RE | Admit: 2015-09-20 | Discharge: 2015-09-20 | Disposition: A | Discharge status: Home / Self Care | Payer: Medicare HMO | Source: Ambulatory Visit | Attending: Urology | Admitting: Urology

## 2015-09-20 DIAGNOSIS — C61 Malignant neoplasm of prostate: Secondary | ICD-10-CM | POA: Insufficient documentation

## 2015-09-20 MED ORDER — TECHNETIUM TC 99M MEDRONATE IV KIT
27.4000 | PACK | Freq: Once | INTRAVENOUS | Status: AC | PRN
  Administered 2015-09-20: 27.4 via INTRAVENOUS

## 2015-09-22 ENCOUNTER — Encounter (HOSPITAL_COMMUNITY): Payer: Self-pay | Admitting: Emergency Medicine

## 2015-09-22 ENCOUNTER — Emergency Department (HOSPITAL_COMMUNITY)
Admission: EM | Admit: 2015-09-22 | Discharge: 2015-09-22 | Disposition: A | Discharge status: Home / Self Care | Payer: Medicare HMO | Attending: Emergency Medicine | Admitting: Emergency Medicine

## 2015-09-22 DIAGNOSIS — Z87891 Personal history of nicotine dependence: Secondary | ICD-10-CM | POA: Insufficient documentation

## 2015-09-22 DIAGNOSIS — Z8619 Personal history of other infectious and parasitic diseases: Secondary | ICD-10-CM | POA: Insufficient documentation

## 2015-09-22 DIAGNOSIS — Y802 Prosthetic and other implants, materials and accessory physical medicine devices associated with adverse incidents: Secondary | ICD-10-CM | POA: Insufficient documentation

## 2015-09-22 DIAGNOSIS — Z8546 Personal history of malignant neoplasm of prostate: Secondary | ICD-10-CM | POA: Diagnosis not present

## 2015-09-22 DIAGNOSIS — Z79899 Other long term (current) drug therapy: Secondary | ICD-10-CM | POA: Diagnosis not present

## 2015-09-22 DIAGNOSIS — R339 Retention of urine, unspecified: Secondary | ICD-10-CM | POA: Diagnosis not present

## 2015-09-22 DIAGNOSIS — I10 Essential (primary) hypertension: Secondary | ICD-10-CM | POA: Diagnosis not present

## 2015-09-22 DIAGNOSIS — T839XXA Unspecified complication of genitourinary prosthetic device, implant and graft, initial encounter: Secondary | ICD-10-CM

## 2015-09-22 DIAGNOSIS — T83098A Other mechanical complication of other indwelling urethral catheter, initial encounter: Secondary | ICD-10-CM | POA: Diagnosis present

## 2015-09-22 LAB — URINALYSIS, ROUTINE W REFLEX MICROSCOPIC
Glucose, UA: 100 mg/dL — AB
HGB URINE DIPSTICK: NEGATIVE
KETONES UR: NEGATIVE mg/dL
Nitrite: NEGATIVE
Protein, ur: >300 mg/dL — AB
Specific Gravity, Urine: 1.009 (ref 1.005–1.030)
UROBILINOGEN UA: 0.2 mg/dL (ref 0.0–1.0)
pH: 8.5 — ABNORMAL HIGH (ref 5.0–8.0)

## 2015-09-22 LAB — URINE MICROSCOPIC-ADD ON

## 2015-09-22 MED ORDER — LIDOCAINE HCL 2 % EX GEL
CUTANEOUS | Status: AC
  Administered 2015-09-22: 1
  Filled 2015-09-22: qty 5

## 2015-09-22 NOTE — ED Provider Notes (Signed)
CSN: [id number]     Arrival date & time 09/22/15  1534 History   First MD Initiated Contact with Patient 09/22/15 1634     Chief Complaint  Patient presents with  . Urinary Retention     (Consider location/radiation/quality/duration/timing/severity/associated sxs/prior Treatment) Patient is a 64 y.o. male presenting with abdominal pain.  Abdominal Pain Pain location:  Suprapubic Pain quality: aching, fullness and sharp   Pain radiates to:  Does not radiate Pain severity:  Moderate Onset quality:  Gradual Duration:  12 hours Timing:  Constant Progression:  Worsening Chronicity:  Recurrent Context: not alcohol use, not diet changes and not eating   Relieved by:  None tried Worsened by:  Nothing tried Ineffective treatments:  None tried Associated symptoms: no chills and no fever     Past Medical History  Diagnosis Date  . Hypertension   . Cancer [facility]) 1997    prostate  . Hepatitis C    Past Surgical History  Procedure Laterality Date  . Cervical disc surgery      neck  . Nose surgery      in high school-nasal fracturex2  . Achilles tendon repair     Family History  Problem Relation Age of Onset  . Heart disease Mother   . Obesity Mother   . Cervical cancer Mother   . Heart disease Father   . Diabetes Father   . ALS Sister   . Colon cancer Neg Hx    Social History  Substance Use Topics  . Smoking status: Former Research scientist (life sciences)  . Smokeless tobacco: Never Used  . Alcohol Use: No    Review of Systems  Constitutional: Negative for fever and chills.  Eyes: Negative for pain.  Gastrointestinal: Positive for abdominal pain.  Genitourinary: Positive for decreased urine volume and difficulty urinating.  Musculoskeletal: Negative for back pain, joint swelling and gait problem.  All other systems reviewed and are negative.     Allergies  Amlodipine  Home Medications   Prior to Admission medications   Medication Sig Start Date End Date Taking? Authorizing  Provider  acetaminophen (TYLENOL) 500 MG tablet Take 500 mg by mouth every 6 (six) hours as needed for mild pain.   Yes Historical Provider, MD  losartan (COZAAR) 100 MG tablet Take 100 mg by mouth every morning.   Yes Historical Provider, MD  MOVIPREP 100 G SOLR Take 1 kit (200 g total) by mouth once. moviprep as directed. No substitutions Patient not taking: Reported on 10/29/2014 09/08/14   Irene Shipper, MD   BP 197/112 mmHg  Pulse 87  Temp(Src) 98.4 F (36.9 C) (Oral)  Resp 16  SpO2 100% Physical Exam  Constitutional: He is oriented to person, place, and time. He appears well-developed and well-nourished.  HENT:  Head: Normocephalic and atraumatic.  Eyes: Conjunctivae and EOM are normal.  Neck: Normal range of motion. Neck supple.  Cardiovascular: Normal rate and regular rhythm.   Pulmonary/Chest: Effort normal. No respiratory distress.  Abdominal: Soft. There is tenderness (suprapubic).  Musculoskeletal: Normal range of motion. He exhibits no edema or tenderness.  Neurological: He is alert and oriented to person, place, and time.  Skin: Skin is warm and dry.  Nursing note and vitals reviewed.   ED Course  Procedures (including critical care time) Labs Review Labs Reviewed  URINALYSIS, ROUTINE W REFLEX MICROSCOPIC (NOT AT [facility]) - Abnormal; Notable for the following:    APPearance TURBID (*)    pH 8.5 (*)    Glucose, UA 100 (*)  Bilirubin Urine SMALL (*)    Protein, ur >300 (*)    Leukocytes, UA MODERATE (*)    All other components within normal limits  URINE MICROSCOPIC-ADD ON - Abnormal; Notable for the following:    Crystals TRIPLE PHOSPHATE CRYSTALS (*)    All other components within normal limits    Imaging Review No results found. I have personally reviewed and evaluated these images and lab results as part of my medical decision-making.   EKG Interpretation None      MDM   Final diagnoses:  Foley catheter problem, initial encounter ([place])    Indwelling foley clogged with suprapubic abdominal pain. Will bladder scan and replace and reassess for any other symptoms and repeat blood pressure.  Bladder scan with approximately 400 mL in and which returned with a Foley was placed. Patient with absolute relief of all symptoms. Blood pressure improved to 150s over 101. I feel the patient likely had a blood clot in his catheter and is the cause of all of his symptoms, doubt any acute obstruction requiring further workup. He'll continue to follow-up as previously scheduled.  I have personally and contemperaneously reviewed labs and imaging and used in my decision making as above.   A medical screening exam was performed and I feel the patient has had an appropriate workup for their chief complaint at this time and likelihood of emergent condition existing is low. They have been counseled on decision, discharge, follow up and which symptoms necessitate immediate return to the emergency department. They or their family verbally stated understanding and agreement with plan and discharged in stable condition.      Merrily Pew, MD 09/22/15 [phone]

## 2015-09-22 NOTE — ED Notes (Signed)
Pt states that he has prostate CA and has a constant indwelling cath but this one was put in two weeks ago and is not draining. Has not drained since this morning. Hx of same problem. Alert and oriented.

## 2015-12-27 ENCOUNTER — Other Ambulatory Visit: Payer: Self-pay | Admitting: Urology

## 2016-01-11 NOTE — Patient Instructions (Addendum)
YOUR PROCEDURE IS SCHEDULED ON : 01/26/16  REPORT TO [place] MAIN ENTRANCE FOLLOW SIGNS TO EAST ELEVATOR - GO TO 3rd FLOOR CHECK IN AT 3 EAST NURSES STATION (SHORT STAY) AT:  5:15 AM  CALL THIS NUMBER IF YOU HAVE PROBLEMS THE MORNING OF SURGERY [phone]  REMEMBER:ONLY 1 PER PERSON MAY GO TO SHORT STAY WITH YOU TO GET READY THE MORNING OF YOUR SURGERY  DO NOT EAT FOOD OR DRINK LIQUIDS AFTER MIDNIGHT  TAKE THESE MEDICINES THE MORNING OF SURGERY: NONE  FOLLOW BOWEL PREP  YOU MAY NOT HAVE ANY METAL ON YOUR BODY INCLUDING HAIR PINS AND PIERCING'S. DO NOT WEAR JEWELRY, MAKEUP, LOTIONS, POWDERS OR PERFUMES. DO NOT WEAR NAIL POLISH. DO NOT SHAVE 48 HRS PRIOR TO SURGERY. MEN MAY SHAVE FACE AND NECK.  DO NOT [place]. [facility] IS NOT RESPONSIBLE FOR VALUABLES.  CONTACTS, DENTURES OR PARTIALS MAY NOT BE WORN TO SURGERY. LEAVE SUITCASE IN CAR. CAN BE BROUGHT TO ROOM AFTER SURGERY.  PATIENTS DISCHARGED THE DAY OF SURGERY WILL NOT BE ALLOWED TO DRIVE HOME.  PLEASE READ OVER THE FOLLOWING INSTRUCTION SHEETS _________________________________________________________________________________                                          [place] - PREPARING FOR SURGERY  Before surgery, you can play an important role.  Because skin is not sterile, your skin needs to be as free of germs as possible.  You can reduce the number of germs on your skin by washing with CHG (chlorahexidine gluconate) soap before surgery.  CHG is an antiseptic cleaner which kills germs and bonds with the skin to continue killing germs even after washing. Please DO NOT use if you have an allergy to CHG or antibacterial soaps.  If your skin becomes reddened/irritated stop using the CHG and inform your nurse when you arrive at Short Stay. Do not shave (including legs and underarms) for at least 48 hours prior to the first CHG shower.  You may shave your face. Please follow these  instructions carefully:   1.  Shower with CHG Soap the night before surgery and the  morning of Surgery.   2.  If you choose to wash your hair, wash your hair first as usual with your  normal  Shampoo.   3.  After you shampoo, rinse your hair and body thoroughly to remove the  shampoo.                                         4.  Use CHG as you would any other liquid soap.  You can apply chg directly  to the skin and wash . Gently wash with scrungie or clean wascloth    5.  Apply the CHG Soap to your body ONLY FROM THE NECK DOWN.   Do not use on open                           Wound or open sores. Avoid contact with eyes, ears mouth and genitals (private parts).                        Genitals (private parts) with your normal  soap.              6.  Wash thoroughly, paying special attention to the area where your surgery  will be performed.   7.  Thoroughly rinse your body with warm water from the neck down.   8.  DO NOT shower/wash with your normal soap after using and rinsing off  the CHG Soap .                9.  Pat yourself dry with a clean towel.             10.  Wear clean night clothes to bed after shower             11.  Place clean sheets on your bed the night of your first shower and do not  sleep with pets.  Day of Surgery : Do not apply any lotions/deodorants the morning of surgery.  Please wear clean clothes to the hospital/surgery center.  FAILURE TO FOLLOW THESE INSTRUCTIONS MAY RESULT IN THE CANCELLATION OF YOUR SURGERY    PATIENT SIGNATURE_________________________________  ______________________________________________________________________     Casey [place]  An incentive spirometer is a tool that can help keep your lungs clear and active. This tool measures how well you are filling your lungs with each breath. Taking long deep breaths may help reverse or decrease the chance of developing breathing (pulmonary) problems (especially infection)  following:  A long period of time when you are unable to move or be active. BEFORE THE PROCEDURE   If the spirometer includes an indicator to show your best effort, your nurse or respiratory therapist will set it to a desired goal.  If possible, sit up straight or lean slightly forward. Try not to slouch.  Hold the incentive spirometer in an upright position. INSTRUCTIONS FOR USE   Sit on the edge of your bed if possible, or sit up as far as you can in bed or on a chair.  Hold the incentive spirometer in an upright position.  Breathe out normally.  Place the mouthpiece in your mouth and seal your lips tightly around it.  Breathe in slowly and as deeply as possible, raising the piston or the ball toward the top of the column.  Hold your breath for 3-5 seconds or for as long as possible. Allow the piston or ball to fall to the bottom of the column.  Remove the mouthpiece from your mouth and breathe out normally.  Rest for a few seconds and repeat Steps 1 through 7 at least 10 times every 1-2 hours when you are awake. Take your time and take a few normal breaths between deep breaths.  The spirometer may include an indicator to show your best effort. Use the indicator as a goal to work toward during each repetition.  After each set of 10 deep breaths, practice coughing to be sure your lungs are clear. If you have an incision (the cut made at the time of surgery), support your incision when coughing by placing a pillow or rolled up towels firmly against it. Once you are able to get out of bed, walk around indoors and cough well. You may stop using the incentive spirometer when instructed by your caregiver.  RISKS AND COMPLICATIONS  Take your time so you do not get dizzy or light-headed.  If you are in pain, you may need to take or ask for pain medication before doing incentive spirometry. It is harder to take a deep breath  if you are having pain. AFTER USE  Rest and breathe slowly  and easily.  It can be helpful to keep track of a log of your progress. Your caregiver can provide you with a simple table to help with this. If you are using the spirometer at home, follow these instructions: [place] IF:   You are having difficultly using the spirometer.  You have trouble using the spirometer as often as instructed.  Your pain medication is not giving enough relief while using the spirometer.  You develop fever of 100.5 F (38.1 C) or higher. SEEK IMMEDIATE MEDICAL CARE IF:   You cough up bloody sputum that had not been present before.  You develop fever of 102 F (38.9 C) or greater.  You develop worsening pain at or near the incision site. MAKE SURE YOU:   Understand these instructions.  Will watch your condition.  Will get help right away if you are not doing well or get worse. Document Released: 03/25/2007 Document Revised: 02/04/2012 Document Reviewed: 05/26/2007 Exit[employer] Patient Information 2014 [employer], [place].   ________________________________________________________________________  WHAT IS A BLOOD TRANSFUSION? Blood Transfusion Information  A transfusion is the replacement of blood or some of its parts. Blood is made up of multiple cells which provide different functions.  Red blood cells carry oxygen and are used for blood loss replacement.  White blood cells fight against infection.  Platelets control bleeding.  Plasma helps clot blood.  Other blood products are available for specialized needs, such as hemophilia or other clotting disorders. BEFORE THE TRANSFUSION  Who gives blood for transfusions?   Healthy volunteers who are fully evaluated to make sure their blood is safe. This is blood bank blood. Transfusion therapy is the safest it has ever been in the practice of medicine. Before blood is taken from a donor, a complete history is taken to make sure that person has no history of diseases nor engages in risky social  behavior (examples are intravenous drug use or sexual activity with multiple partners). The donor's travel history is screened to minimize risk of transmitting infections, such as malaria. The donated blood is tested for signs of infectious diseases, such as HIV and hepatitis. The blood is then tested to be sure it is compatible with you in order to minimize the chance of a transfusion reaction. If you or a relative donates blood, this is often done in anticipation of surgery and is not appropriate for emergency situations. It takes many days to process the donated blood. RISKS AND COMPLICATIONS Although transfusion therapy is very safe and saves many lives, the main dangers of transfusion include:   Getting an infectious disease.  Developing a transfusion reaction. This is an allergic reaction to something in the blood you were given. Every precaution is taken to prevent this. The decision to have a blood transfusion has been considered carefully by your caregiver before blood is given. Blood is not given unless the benefits outweigh the risks. AFTER THE TRANSFUSION  Right after receiving a blood transfusion, you will usually feel much better and more energetic. This is especially true if your red blood cells have gotten low (anemic). The transfusion raises the level of the red blood cells which carry oxygen, and this usually causes an energy increase.  The nurse administering the transfusion will monitor you carefully for complications. HOME CARE INSTRUCTIONS  No special instructions are needed after a transfusion. You may find your energy is better. Speak with your caregiver about any limitations  on activity for underlying diseases you may have. SEEK MEDICAL CARE IF:   Your condition is not improving after your transfusion.  You develop redness or irritation at the intravenous (IV) site. SEEK IMMEDIATE MEDICAL CARE IF:  Any of the following symptoms occur over the next 12 hours:  Shaking  chills.  You have a temperature by mouth above 102 F (38.9 C), not controlled by medicine.  Chest, back, or muscle pain.  People around you feel you are not acting correctly or are confused.  Shortness of breath or difficulty breathing.  Dizziness and fainting.  You get a rash or develop hives.  You have a decrease in urine output.  Your urine turns a dark color or changes to pink, red, or brown. Any of the following symptoms occur over the next 10 days:  You have a temperature by mouth above 102 F (38.9 C), not controlled by medicine.  Shortness of breath.  Weakness after normal activity.  The white part of the eye turns yellow (jaundice).  You have a decrease in the amount of urine or are urinating less often.  Your urine turns a dark color or changes to pink, red, or brown. Document Released: 11/09/2000 Document Revised: 02/04/2012 Document Reviewed: 06/28/2008 [facility] Patient Information 2014 [place], [place].  _______________________________________________________________________

## 2016-01-12 ENCOUNTER — Encounter (HOSPITAL_COMMUNITY): Payer: Self-pay

## 2016-01-12 ENCOUNTER — Encounter (INDEPENDENT_AMBULATORY_CARE_PROVIDER_SITE_OTHER): Payer: Self-pay

## 2016-01-12 ENCOUNTER — Ambulatory Visit (HOSPITAL_COMMUNITY)
Admission: RE | Admit: 2016-01-12 | Discharge: 2016-01-12 | Disposition: A | Discharge status: Home / Self Care | Payer: Medicare HMO | Source: Ambulatory Visit | Attending: Urology | Admitting: Urology

## 2016-01-12 ENCOUNTER — Encounter (HOSPITAL_COMMUNITY)
Admission: RE | Admit: 2016-01-12 | Discharge: 2016-01-12 | Disposition: A | Discharge status: Home / Self Care | Payer: Medicare HMO | Source: Ambulatory Visit | Attending: Urology | Admitting: Urology

## 2016-01-12 DIAGNOSIS — Z01810 Encounter for preprocedural cardiovascular examination: Secondary | ICD-10-CM | POA: Insufficient documentation

## 2016-01-12 DIAGNOSIS — I10 Essential (primary) hypertension: Secondary | ICD-10-CM | POA: Insufficient documentation

## 2016-01-12 DIAGNOSIS — C61 Malignant neoplasm of prostate: Secondary | ICD-10-CM | POA: Insufficient documentation

## 2016-01-12 DIAGNOSIS — Z01818 Encounter for other preprocedural examination: Secondary | ICD-10-CM | POA: Insufficient documentation

## 2016-01-12 DIAGNOSIS — Z87891 Personal history of nicotine dependence: Secondary | ICD-10-CM | POA: Diagnosis not present

## 2016-01-12 HISTORY — DX: Anorexia: R63.0

## 2016-01-12 HISTORY — DX: Presence of other specified devices: Z97.8

## 2016-01-12 HISTORY — DX: Retention of urine, unspecified: R33.9

## 2016-01-12 HISTORY — DX: Bradycardia, unspecified: R00.1

## 2016-01-12 HISTORY — DX: Personal history of other diseases of the musculoskeletal system and connective tissue: Z87.39

## 2016-01-12 HISTORY — DX: Presence of urogenital implants: Z96.0

## 2016-01-12 LAB — BASIC METABOLIC PANEL
Anion gap: 8 (ref 5–15)
BUN: 37 mg/dL — AB (ref 6–20)
CALCIUM: 9.2 mg/dL (ref 8.9–10.3)
CO2: 30 mmol/L (ref 22–32)
Chloride: 100 mmol/L — ABNORMAL LOW (ref 101–111)
Creatinine, Ser: 2.07 mg/dL — ABNORMAL HIGH (ref 0.61–1.24)
GFR calc Af Amer: 36 mL/min — ABNORMAL LOW (ref >60)
GFR, EST NON AFRICAN AMERICAN: 31 mL/min — AB (ref >60)
GLUCOSE: 116 mg/dL — AB (ref 65–99)
Potassium: 3.1 mmol/L — ABNORMAL LOW (ref 3.5–5.1)
SODIUM: 138 mmol/L (ref 135–145)

## 2016-01-12 LAB — CBC
HCT: 43.9 % (ref 39.0–52.0)
Hemoglobin: 14.7 g/dL (ref 13.0–17.0)
MCH: 31 pg (ref 26.0–34.0)
MCHC: 33.5 g/dL (ref 30.0–36.0)
MCV: 92.6 fL (ref 78.0–100.0)
PLATELETS: 167 10*3/uL (ref 150–400)
RBC: 4.74 MIL/uL (ref 4.22–5.81)
RDW: 12.7 % (ref 11.5–15.5)
WBC: 7.8 10*3/uL (ref 4.0–10.5)

## 2016-01-12 NOTE — Progress Notes (Signed)
Abnormal BMET faxed to Dr.Borden

## 2016-01-12 NOTE — Progress Notes (Signed)
   01/12/16 0852  OBSTRUCTIVE SLEEP APNEA  Have you ever been diagnosed with sleep apnea through a sleep study? No  Do you snore loudly (loud enough to be heard through closed doors)?  1  Do you often feel tired, fatigued, or sleepy during the daytime (such as falling asleep during driving or talking to someone)? 1  Has anyone observed you stop breathing during your sleep? 0  Do you have, or are you being treated for high blood pressure? 1  BMI more than 35 kg/m2? 0  Age > 50 (1-yes) 1  Neck circumference greater than:Male 16 inches or larger, Male 17inches or larger? 1  Male Gender (Yes=1) 1  Obstructive Sleep Apnea Score 6  Score 5 or greater  Results sent to PCP

## 2016-01-24 NOTE — Progress Notes (Signed)
   01/12/16 0852  OBSTRUCTIVE SLEEP APNEA  Have you ever been diagnosed with sleep apnea through a sleep study? No  Do you snore loudly (loud enough to be heard through closed doors)?  1  Do you often feel tired, fatigued, or sleepy during the daytime (such as falling asleep during driving or talking to someone)? 1  Has anyone observed you stop breathing during your sleep? 0  Do you have, or are you being treated for high blood pressure? 1  BMI more than 35 kg/m2? 0  Age > 50 (1-yes) 1  Neck circumference greater than:Male 16 inches or larger, Male 17inches or larger? 1  Male Gender (Yes=1) 1  Obstructive Sleep Apnea Score 6  Score 5 or greater  Results sent to PCP

## 2016-01-25 NOTE — H&P (Addendum)
Chief Complaint High-risk prostate cancer and urinary retention   History of Present Illness Casey Greene is a 65 year old patient with high risk prostate cancer who I initially saw in consultation in May 2014 for a 2nd opinion. He was initially diagnosed at age 67 with Gleason 3+3=6 adenocarcinoma of the prostate in 2000 in [place], [place] by Dr. Beverlee Nims. His PSA at the time of diagnosis was 7.6. He refused treatment of curative intent and did not follow up as recommended. He presented to Dr. Courtney Paris again in 2003 and underwent bone scan and CT imaging studies that did not demonstrate metastatic disease. He again was strongly recommended to undergo definitive treatment at that time but instead chose to proceed with alternative therapy with an herbalist. He initially presented to Dr. Diona Fanti in March 2009 after having moved to [facility]. His PSA at that time was found to be 13 and he underwent another prostate biopsy on 02/25/08 which was also read at [facility] and confirmed Gleason 4+4=8 adenocarcinoma of the prostate in 9 out of 12 biopsy cores. Staging studies were again performed and were negative for metastatic disease. He was strongly recommended by Dr. Diona Fanti to proceed with definitive curative treatment. He also saw Dr. Lawanna Kobus at [facility] for a 2nd opinion who likewise recommended curative therapy. Mr. Esselman again chose not to follow these recommendations for treatment and continued with alternative, non curative, herbal medical treatments.    He then developed urinary retention in December 2013. He had previously had transient episodes of urinary retention and had always previously been able to pass voiding trials. His prostate volume had been measured at 120 cc by Dr. Diona Fanti in 2009. He failed multiple voiding trials despite maximal alpha blocker therapy and saw Dr. Janice Norrie who eventually referred him to Dr. Junious Silk to discuss possible surgical intervention for his  urinary retention. His PSA in April 2014 was 17.3. He underwent a bone scan on 03/11/13 and a CT scan of the abdomen and pelvis on 04/07/13 which were fortunately negative for obvious metastatic disease. A repeat TRUS of the prostate revealed a gland size of 175 cc now. He also underwent a urodynamic evaluation on 02/26/13 which demonstrated changes consistent with bladder outlet obstruction but preserved detrusor function. His bladder was hypersensitive and unstable with evidence of trabeculation and diverticuli.    At the time of his initial discussion with me, I had strongly recommended therapy of curative intent with surgery to treat both his malignancy and urinary retention. He ultimately again decided to forego curative therapy despite recommendations. He continued herbal treatments and continued follow up with Dr. Junious Silk. He ultimately chose to continue managing his urinary retention/BPH with catheter drainage rather than surgical intervention and after failing more voiding trials. His PSA continued to increased up to 39.38 in December 2016. An MRI of the prostate in April 2015 confirmed high volume, high grade but localized disease. Bone scan and CT imaging of the pelvis in October 2016 demonstrated no bone metastases and a 1 cm periprostatic lymph node.    ** He has a history of hepatitis C.    TNM stage: cT1c N0 M0  PSA: 17.3  Gleason score: 4+4=8  Prostate volume: 175 cc    Urinary function: He is in urinary retention with a chronic indwelling catheter.  Erectile function: He has severe erectile dysfunction. SHIM score is 1. This was present prior to developing urinary retention.     Interval history:   At this time,  [name] and his wife have decided that they do wish to consider curative therapy. Considering his urinary retention, they're most interested in primary surgical therapy.     Past Medical History Problems  1. History of Hepatitis C virus (B19.20) 2.  History of hypertension (Z86.79) 3. Personal history of prostate cancer (Z85.46)  Surgical History Problems  1. History of Neck Surgery 2. History of Nose Surgery 3. History of Primary Repair Of Ruptured Achilles Tendon  Current Meds 1. Losartan Potassium 50 MG Oral Tablet;  Therapy: (Recorded:19Dec2013) to Recorded  Allergies Medication  1. Tamsulosin HCl CAPS  Family History Problems  1. Family history of Death In The Family Father : Father   StrokeAge 13 2. Family history of Death In The Family Mother : Mother   HospitalAge 20 3. Family history of Family Health Status Number Of Children   One son and one daughter 67. Family history of Stroke Syndrome : Father  Social History Problems  1. Denied: Alcohol Use (History) 2. Marital History - Currently Married 3. Never A Smoker 4. Occupation:   Herbalist. 5. Denied: Tobacco Use  Review of Systems Genitourinary, constitutional, skin, eye, otolaryngeal, hematologic/lymphatic, cardiovascular, pulmonary, endocrine, musculoskeletal, gastrointestinal, neurological and psychiatric system(s) were reviewed and pertinent findings if present are noted and are otherwise negative.  Constitutional: no recent weight loss.  Hematologic/Lymphatic: no swollen glands.  Cardiovascular: no leg swelling.  Musculoskeletal: no back pain.      Physical Exam Constitutional: Well nourished and well developed . No acute distress.  ENT:. The ears and nose are normal in appearance.  Neck: The appearance of the neck is normal and no neck mass is present.  Pulmonary: No respiratory distress, normal respiratory rhythm and effort and clear bilateral breath sounds.  Cardiovascular: Heart rate and rhythm are normal . No peripheral edema.  Abdomen: The abdomen is soft and nontender. No masses are palpated. No CVA tenderness. No hernias are palpable. No hepatosplenomegaly noted.     Results/Data Selected Results  BUN & CREATININE [id number]  02:30PM Festus Aloe  SPECIMEN TYPE: BLOOD   Test Name Result Flag Reference  CREATININE 1.69 mg/dL H 0.50-1.50  BUN 34 mg/dL H 7-25  Est GFR, African American 48 mL/min L >=60  Est GFR, NonAfrican American 41 mL/min L >=60  THE ESTIMATED GFR IS A CALCULATION VALID FOR ADULTS (>=48 YEARS OLD) THAT USES THE CKD-EPI ALGORITHM TO ADJUST FOR AGE AND SEX. IT IS   NOT TO BE USED FOR CHILDREN, PREGNANT WOMEN, HOSPITALIZED PATIENTS,    PATIENTS ON DIALYSIS, OR WITH RAPIDLY CHANGING KIDNEY FUNCTION. ACCORDING TO THE NKDEP, EGFR >89 IS NORMAL, 60-89 SHOWS MILD IMPAIRMENT, 30-59 SHOWS MODERATE IMPAIRMENT, 15-29 SHOWS SEVERE IMPAIRMENT AND <15 IS ESRD.   PSA [id number] 02:30PM Festus Aloe  SPECIMEN TYPE: BLOOD   Test Name Result Flag Reference  PSA 39.38 ng/mL H <=4.00  TEST METHODOLOGY: ECLIA PSA (ELECTROCHEMILUMINESCENCE IMMUNOASSAY)   AU CT-HEMATURIA PROTOCOL 25Oct2016 12:00AM Festus Aloe   Test Name Result Flag Reference  AU CT-HEMATURIA PROTOCOL (Report)    ** RADIOLOGY REPORT BY [facility] **   CLINICAL DATA: Gross hematuria 1 month ago lasting for 2 days. History of prostate cancer.  EXAM: CT ABDOMEN AND PELVIS WITHOUT AND WITH CONTRAST  TECHNIQUE: Multidetector CT imaging of the abdomen and pelvis was performed following the standard protocol before and following the bolus administration of intravenous contrast.  CONTRAST: 125 cc Omnipaque 300  COMPARISON: Multiple exams, including 03/24/2014 and 04/07/2013  FINDINGS: Lower chest:  Linear scarring in the posterior basal segment left lower lobe. Subsegmental atelectasis posteriorly in the lingula. Stable mild cardiomegaly.  Hepatobiliary: Unremarkable  Pancreas: Unremarkable  Spleen: Unremarkable  Adrenals/Urinary Tract: Multiple small hypodense lesions in both kidneys favoring renal cysts. Some of the lesions are technically too small to characterize. None of them appear to have  significant abnormal enhancing components. No definite stones or abnormal filling defects along the urothelium. Foley catheter in the urinary bladder.  Stomach/Bowel: Unremarkable  Vascular/Lymphatic: Aortoiliac atherosclerotic vascular disease. Suspected enhancing lymph node along the left anterior space of Retzius 1.0 cm in short axis, previously 0.4 cm.  Reproductive: Massively enlarged prostate gland, 9.9 by 7.5 by 6.9 cm (approximately 266 cc). Heterogeneous prostate enhancement primarily in the central zone.  Other: No supplemental non-categorized findings.  Musculoskeletal: No compelling findings of bony metastatic disease by CT. Lower lumbar degenerative facet arthropathy particularly at L5-S 1. Mild grade 1 degenerative anterolisthesis at L5-S1 with a small central disc protrusion.  IMPRESSION: 1. Very large prostate gland. There is a small enhancing lymph node in the left anterior space of Retzius measuring 1 cm in short axis. 2. Some of the hypodense renal lesions are technically too small to characterize although statistically likely to be cysts. 3. Foley catheter in the urinary bladder. 4. Mild cardiomegaly, stable. 5. Aortoiliac atherosclerotic vascular disease. 6. Lower lumbar degenerative facet arthropathy particularly at L5-S1.   Electronically Signed  By: Van Clines M.D.  On: 09/20/2015 13:45    Assessment  Assessed  1. Prostate cancer (C61)     Discussion/Summary 1. Very high risk prostate cancer:   He does wish to proceed with primary surgical therapy. He will undergo a non-nerve sparing robot-assisted laparoscopic radical prostatectomy and extended pelvic lymphadenectomy. He understands that he may be at a slightly increased risk for open surgical conversion considering the size of his prostate. Considering the likelihood that he will have an extensive bladder neck reconstruction, his catheter will also be left indwelling for slightly  longer in the postoperative period.

## 2016-01-26 ENCOUNTER — Inpatient Hospital Stay (HOSPITAL_COMMUNITY): Payer: Medicare HMO | Admitting: Certified Registered Nurse Anesthetist

## 2016-01-26 ENCOUNTER — Encounter (HOSPITAL_COMMUNITY)
Admission: AD | Disposition: A | Discharge status: Home / Self Care | Payer: Self-pay | Source: Ambulatory Visit | Attending: Urology

## 2016-01-26 ENCOUNTER — Inpatient Hospital Stay (HOSPITAL_COMMUNITY)
Admission: AD | Admit: 2016-01-26 | Discharge: 2016-01-28 | DRG: 708 | Disposition: A | Discharge status: Home / Self Care | Payer: Medicare HMO | Source: Ambulatory Visit | Attending: Urology | Admitting: Urology

## 2016-01-26 ENCOUNTER — Encounter (HOSPITAL_COMMUNITY): Payer: Self-pay | Admitting: *Deleted

## 2016-01-26 DIAGNOSIS — I10 Essential (primary) hypertension: Secondary | ICD-10-CM | POA: Diagnosis present

## 2016-01-26 DIAGNOSIS — N189 Chronic kidney disease, unspecified: Secondary | ICD-10-CM | POA: Diagnosis present

## 2016-01-26 DIAGNOSIS — R338 Other retention of urine: Secondary | ICD-10-CM | POA: Diagnosis present

## 2016-01-26 DIAGNOSIS — B1920 Unspecified viral hepatitis C without hepatic coma: Secondary | ICD-10-CM | POA: Diagnosis present

## 2016-01-26 DIAGNOSIS — C61 Malignant neoplasm of prostate: Secondary | ICD-10-CM | POA: Diagnosis present

## 2016-01-26 DIAGNOSIS — Z823 Family history of stroke: Secondary | ICD-10-CM | POA: Diagnosis not present

## 2016-01-26 DIAGNOSIS — N529 Male erectile dysfunction, unspecified: Secondary | ICD-10-CM | POA: Diagnosis present

## 2016-01-26 HISTORY — PX: LYMPHADENECTOMY: SHX5960

## 2016-01-26 HISTORY — PX: ROBOT ASSISTED LAPAROSCOPIC RADICAL PROSTATECTOMY: SHX5141

## 2016-01-26 LAB — BASIC METABOLIC PANEL
Anion gap: 13 (ref 5–15)
BUN: 29 mg/dL — AB (ref 6–20)
CHLORIDE: 98 mmol/L — AB (ref 101–111)
CO2: 25 mmol/L (ref 22–32)
CREATININE: 1.8 mg/dL — AB (ref 0.61–1.24)
Calcium: 8.6 mg/dL — ABNORMAL LOW (ref 8.9–10.3)
GFR calc Af Amer: 43 mL/min — ABNORMAL LOW (ref >60)
GFR calc non Af Amer: 37 mL/min — ABNORMAL LOW (ref >60)
GLUCOSE: 126 mg/dL — AB (ref 65–99)
POTASSIUM: 3.2 mmol/L — AB (ref 3.5–5.1)
SODIUM: 136 mmol/L (ref 135–145)

## 2016-01-26 LAB — TYPE AND SCREEN
ABO/RH(D): O POS
ANTIBODY SCREEN: NEGATIVE

## 2016-01-26 LAB — HEMOGLOBIN AND HEMATOCRIT, BLOOD
HEMATOCRIT: 41.1 % (ref 39.0–52.0)
Hemoglobin: 14 g/dL (ref 13.0–17.0)

## 2016-01-26 LAB — ABO/RH: ABO/RH(D): O POS

## 2016-01-26 SURGERY — XI ROBOTIC ASSISTED LAPAROSCOPIC RADICAL PROSTATECTOMY LEVEL 3
Anesthesia: General

## 2016-01-26 MED ORDER — MORPHINE SULFATE (PF) 2 MG/ML IV SOLN
2.0000 mg | INTRAVENOUS | Status: DC | PRN
  Administered 2016-01-26: 4 mg via INTRAVENOUS
  Filled 2016-01-26: qty 2

## 2016-01-26 MED ORDER — ONDANSETRON HCL 4 MG/2ML IJ SOLN
INTRAMUSCULAR | Status: AC
  Filled 2016-01-26: qty 2

## 2016-01-26 MED ORDER — KCL IN DEXTROSE-NACL 20-5-0.45 MEQ/L-%-% IV SOLN
INTRAVENOUS | Status: DC
  Administered 2016-01-26 (×3): via INTRAVENOUS
  Filled 2016-01-26 (×4): qty 1000

## 2016-01-26 MED ORDER — LIDOCAINE HCL (CARDIAC) 20 MG/ML IV SOLN
INTRAVENOUS | Status: AC
  Filled 2016-01-26: qty 5

## 2016-01-26 MED ORDER — ONDANSETRON HCL 4 MG/2ML IJ SOLN
INTRAMUSCULAR | Status: DC | PRN
  Administered 2016-01-26: 4 mg via INTRAVENOUS

## 2016-01-26 MED ORDER — NITROGLYCERIN IN D5W 200-5 MCG/ML-% IV SOLN: 10.0000 ug/min | INTRAVENOUS | Status: DC

## 2016-01-26 MED ORDER — NITROGLYCERIN 0.2 MG/ML ON CALL CATH LAB
INTRAVENOUS | Status: DC | PRN
  Administered 2016-01-26: 20 ug via INTRAVENOUS

## 2016-01-26 MED ORDER — DOCUSATE SODIUM 100 MG PO CAPS
100.0000 mg | ORAL_CAPSULE | Freq: Two times a day (BID) | ORAL | Status: DC
  Administered 2016-01-26 – 2016-01-27 (×3): 100 mg via ORAL
  Filled 2016-01-26 (×5): qty 1

## 2016-01-26 MED ORDER — LABETALOL HCL 5 MG/ML IV SOLN
INTRAVENOUS | Status: AC
  Filled 2016-01-26: qty 4

## 2016-01-26 MED ORDER — NITROGLYCERIN FOR UTERINE RELAXATION OPTIME 200MCG/ML
200.0000 ug | Freq: Once | INTRAVENOUS | Status: DC
  Filled 2016-01-26: qty 250

## 2016-01-26 MED ORDER — NEOSTIGMINE METHYLSULFATE 10 MG/10ML IV SOLN
INTRAVENOUS | Status: DC | PRN
  Administered 2016-01-26: 4 mg via INTRAVENOUS

## 2016-01-26 MED ORDER — NITROGLYCERIN IN D5W 200-5 MCG/ML-% IV SOLN
INTRAVENOUS | Status: DC | PRN
  Administered 2016-01-26: 5 ug/min via INTRAVENOUS

## 2016-01-26 MED ORDER — SULFAMETHOXAZOLE-TRIMETHOPRIM 800-160 MG PO TABS: 1.0000 | ORAL_TABLET | Freq: Two times a day (BID) | ORAL | Status: DC

## 2016-01-26 MED ORDER — BELLADONNA ALKALOIDS-OPIUM 16.2-60 MG RE SUPP
1.0000 | Freq: Four times a day (QID) | RECTAL | Status: DC | PRN
  Administered 2016-01-26 – 2016-01-27 (×2): 1 via RECTAL
  Filled 2016-01-26 (×2): qty 1

## 2016-01-26 MED ORDER — DEXAMETHASONE SODIUM PHOSPHATE 10 MG/ML IJ SOLN
INTRAMUSCULAR | Status: AC
  Filled 2016-01-26: qty 1

## 2016-01-26 MED ORDER — SODIUM CHLORIDE 0.9 % IR SOLN
Status: DC | PRN
Start: 2016-01-26 — End: 2016-01-26
  Administered 2016-01-26: 1000 mL via INTRAVESICAL

## 2016-01-26 MED ORDER — DEXAMETHASONE SODIUM PHOSPHATE 10 MG/ML IJ SOLN
INTRAMUSCULAR | Status: DC | PRN
  Administered 2016-01-26: 10 mg via INTRAVENOUS

## 2016-01-26 MED ORDER — DIPHENHYDRAMINE HCL 50 MG/ML IJ SOLN: 12.5000 mg | Freq: Four times a day (QID) | INTRAMUSCULAR | Status: DC | PRN

## 2016-01-26 MED ORDER — LACTATED RINGERS IV SOLN: INTRAVENOUS | Status: DC

## 2016-01-26 MED ORDER — HYDROCODONE-ACETAMINOPHEN 5-325 MG PO TABS: 1.0000 | ORAL_TABLET | Freq: Four times a day (QID) | ORAL | Status: DC | PRN

## 2016-01-26 MED ORDER — FENTANYL CITRATE (PF) 250 MCG/5ML IJ SOLN
INTRAMUSCULAR | Status: AC
  Filled 2016-01-26: qty 5

## 2016-01-26 MED ORDER — PROPOFOL 10 MG/ML IV BOLUS
INTRAVENOUS | Status: DC | PRN
  Administered 2016-01-26: 200 mg via INTRAVENOUS

## 2016-01-26 MED ORDER — LACTATED RINGERS IV SOLN
INTRAVENOUS | Status: DC | PRN
  Administered 2016-01-26: 07:00:00 via INTRAVENOUS

## 2016-01-26 MED ORDER — HYDRALAZINE HCL 20 MG/ML IJ SOLN: 10.0000 mg | INTRAMUSCULAR | Status: DC | PRN

## 2016-01-26 MED ORDER — GLYCOPYRROLATE 0.2 MG/ML IJ SOLN
INTRAMUSCULAR | Status: AC
  Filled 2016-01-26: qty 3

## 2016-01-26 MED ORDER — FENTANYL CITRATE (PF) 100 MCG/2ML IJ SOLN
25.0000 ug | INTRAMUSCULAR | Status: DC | PRN
  Administered 2016-01-26 (×2): 50 ug via INTRAVENOUS

## 2016-01-26 MED ORDER — GLYCOPYRROLATE 0.2 MG/ML IJ SOLN
INTRAMUSCULAR | Status: AC
  Filled 2016-01-26: qty 1

## 2016-01-26 MED ORDER — FENTANYL CITRATE (PF) 100 MCG/2ML IJ SOLN
INTRAMUSCULAR | Status: DC | PRN
  Administered 2016-01-26: 50 ug via INTRAVENOUS
  Administered 2016-01-26: 200 ug via INTRAVENOUS
  Administered 2016-01-26 (×2): 50 ug via INTRAVENOUS

## 2016-01-26 MED ORDER — HYDRALAZINE HCL 20 MG/ML IJ SOLN
INTRAMUSCULAR | Status: DC | PRN
  Administered 2016-01-26 (×2): 4 mg via INTRAVENOUS
  Administered 2016-01-26: 2 mg via INTRAVENOUS

## 2016-01-26 MED ORDER — SODIUM CHLORIDE 0.9 % IV BOLUS (SEPSIS)
1000.0000 mL | Freq: Once | INTRAVENOUS | Status: AC
  Administered 2016-01-26: 1000 mL via INTRAVENOUS

## 2016-01-26 MED ORDER — FENTANYL CITRATE (PF) 100 MCG/2ML IJ SOLN
INTRAMUSCULAR | Status: AC
  Filled 2016-01-26: qty 2

## 2016-01-26 MED ORDER — SODIUM CHLORIDE 0.9 % IJ SOLN
INTRAMUSCULAR | Status: AC
  Filled 2016-01-26: qty 10

## 2016-01-26 MED ORDER — LABETALOL HCL 5 MG/ML IV SOLN
INTRAVENOUS | Status: DC | PRN
  Administered 2016-01-26 (×4): 5 mg via INTRAVENOUS

## 2016-01-26 MED ORDER — HYDROMORPHONE HCL 1 MG/ML IJ SOLN
INTRAMUSCULAR | Status: DC | PRN
  Administered 2016-01-26 (×4): .2 mg via INTRAVENOUS

## 2016-01-26 MED ORDER — GLYCOPYRROLATE 0.2 MG/ML IJ SOLN
INTRAMUSCULAR | Status: DC | PRN
  Administered 2016-01-26: 0.6 mg via INTRAVENOUS

## 2016-01-26 MED ORDER — ONDANSETRON HCL 4 MG/2ML IJ SOLN: 4.0000 mg | Freq: Once | INTRAMUSCULAR | Status: DC | PRN

## 2016-01-26 MED ORDER — MIDAZOLAM HCL 2 MG/2ML IJ SOLN
INTRAMUSCULAR | Status: AC
  Filled 2016-01-26: qty 2

## 2016-01-26 MED ORDER — HYDROMORPHONE HCL 2 MG/ML IJ SOLN
INTRAMUSCULAR | Status: AC
  Filled 2016-01-26: qty 1

## 2016-01-26 MED ORDER — FLUORESCEIN SODIUM 10 % IJ SOLN
500.0000 mg | Freq: Once | INTRAMUSCULAR | Status: AC
  Administered 2016-01-26 (×2): .5 mL via INTRAVENOUS
  Filled 2016-01-26: qty 5

## 2016-01-26 MED ORDER — ROCURONIUM BROMIDE 100 MG/10ML IV SOLN
INTRAVENOUS | Status: DC | PRN
  Administered 2016-01-26: 50 mg via INTRAVENOUS
  Administered 2016-01-26: 10 mg via INTRAVENOUS
  Administered 2016-01-26: 5 mg via INTRAVENOUS
  Administered 2016-01-26: 10 mg via INTRAVENOUS

## 2016-01-26 MED ORDER — PROPOFOL 10 MG/ML IV BOLUS
INTRAVENOUS | Status: AC
  Filled 2016-01-26: qty 20

## 2016-01-26 MED ORDER — CEFAZOLIN SODIUM 1-5 GM-% IV SOLN
1.0000 g | Freq: Three times a day (TID) | INTRAVENOUS | Status: AC
  Administered 2016-01-26 – 2016-01-27 (×2): 1 g via INTRAVENOUS
  Filled 2016-01-26 (×3): qty 50

## 2016-01-26 MED ORDER — DIPHENHYDRAMINE HCL 12.5 MG/5ML PO ELIX: 12.5000 mg | ORAL_SOLUTION | Freq: Four times a day (QID) | ORAL | Status: DC | PRN

## 2016-01-26 MED ORDER — ACETAMINOPHEN 10 MG/ML IV SOLN
1000.0000 mg | Freq: Four times a day (QID) | INTRAVENOUS | Status: DC
  Administered 2016-01-26 – 2016-01-27 (×3): 1000 mg via INTRAVENOUS
  Filled 2016-01-26 (×4): qty 100

## 2016-01-26 MED ORDER — SODIUM CHLORIDE 0.9 % IJ SOLN
INTRAMUSCULAR | Status: DC | PRN
  Administered 2016-01-26: 20 mL

## 2016-01-26 MED ORDER — CEFAZOLIN SODIUM-DEXTROSE 2-3 GM-% IV SOLR
2.0000 g | INTRAVENOUS | Status: AC
  Administered 2016-01-26: 2 g via INTRAVENOUS

## 2016-01-26 MED ORDER — KCL IN DEXTROSE-NACL 20-5-0.45 MEQ/L-%-% IV SOLN
INTRAVENOUS | Status: AC
  Administered 2016-01-26: 1000 mL
  Filled 2016-01-26: qty 1000

## 2016-01-26 MED ORDER — MIDAZOLAM HCL 5 MG/5ML IJ SOLN
INTRAMUSCULAR | Status: DC | PRN
  Administered 2016-01-26: 2 mg via INTRAVENOUS

## 2016-01-26 MED ORDER — LACTATED RINGERS IV SOLN
INTRAVENOUS | Status: DC | PRN
  Administered 2016-01-26: 300 mL

## 2016-01-26 MED ORDER — NITROGLYCERIN IN D5W 200-5 MCG/ML-% IV SOLN
INTRAVENOUS | Status: AC
  Filled 2016-01-26: qty 250

## 2016-01-26 MED ORDER — ROCURONIUM BROMIDE 100 MG/10ML IV SOLN
INTRAVENOUS | Status: AC
  Filled 2016-01-26: qty 1

## 2016-01-26 MED ORDER — LIDOCAINE HCL (CARDIAC) 20 MG/ML IV SOLN
INTRAVENOUS | Status: DC | PRN
  Administered 2016-01-26: 100 mg via INTRAVENOUS

## 2016-01-26 MED ORDER — CIPROFLOXACIN IN D5W 200 MG/100ML IV SOLN
200.0000 mg | Freq: Two times a day (BID) | INTRAVENOUS | Status: AC
Start: 2016-01-26 — End: 2016-01-27
  Administered 2016-01-26 – 2016-01-27 (×2): 200 mg via INTRAVENOUS
  Filled 2016-01-26 (×2): qty 100

## 2016-01-26 MED ORDER — SODIUM CHLORIDE 0.9 % IJ SOLN
INTRAMUSCULAR | Status: AC
  Filled 2016-01-26: qty 20

## 2016-01-26 MED ORDER — LOSARTAN POTASSIUM 50 MG PO TABS
100.0000 mg | ORAL_TABLET | Freq: Every day | ORAL | Status: DC
  Administered 2016-01-26: 100 mg via ORAL
  Filled 2016-01-26 (×2): qty 2

## 2016-01-26 MED ORDER — METOPROLOL TARTRATE 25 MG PO TABS
25.0000 mg | ORAL_TABLET | Freq: Two times a day (BID) | ORAL | Status: DC
  Administered 2016-01-26 – 2016-01-28 (×4): 25 mg via ORAL
  Filled 2016-01-26 (×4): qty 1

## 2016-01-26 MED ORDER — CEFAZOLIN SODIUM-DEXTROSE 2-3 GM-% IV SOLR
INTRAVENOUS | Status: AC
  Filled 2016-01-26: qty 50

## 2016-01-26 MED ORDER — HEPARIN SODIUM (PORCINE) 1000 UNIT/ML IJ SOLN
INTRAMUSCULAR | Status: AC
  Filled 2016-01-26: qty 1

## 2016-01-26 MED ORDER — BUPIVACAINE-EPINEPHRINE 0.25% -1:200000 IJ SOLN
INTRAMUSCULAR | Status: DC | PRN
  Administered 2016-01-26: 30 mL

## 2016-01-26 MED ORDER — BUPIVACAINE-EPINEPHRINE (PF) 0.25% -1:200000 IJ SOLN
INTRAMUSCULAR | Status: AC
  Filled 2016-01-26: qty 30

## 2016-01-26 MED ORDER — BUPIVACAINE LIPOSOME 1.3 % IJ SUSP
20.0000 mL | Freq: Once | INTRAMUSCULAR | Status: AC
  Administered 2016-01-26: 20 mL
  Filled 2016-01-26: qty 20

## 2016-01-26 MED ORDER — ESMOLOL HCL 100 MG/10ML IV SOLN
INTRAVENOUS | Status: DC | PRN
  Administered 2016-01-26 (×2): 20 mg via INTRAVENOUS

## 2016-01-26 MED ORDER — ESMOLOL HCL 100 MG/10ML IV SOLN
INTRAVENOUS | Status: AC
  Filled 2016-01-26: qty 10

## 2016-01-26 MED ORDER — NEOSTIGMINE METHYLSULFATE 10 MG/10ML IV SOLN
INTRAVENOUS | Status: AC
  Filled 2016-01-26: qty 1

## 2016-01-26 MED ORDER — HYDRALAZINE HCL 20 MG/ML IJ SOLN
10.0000 mg | Freq: Once | INTRAMUSCULAR | Status: AC
  Administered 2016-01-26: 10 mg via INTRAVENOUS

## 2016-01-26 SURGICAL SUPPLY — 57 items
APPLICATOR COTTON TIP 6IN STRL (MISCELLANEOUS) ×4 IMPLANT
BAG SPEC RTRVL LRG 6X4 10 (ENDOMECHANICALS) ×4
CATH FOLEY 2WAY SLVR 18FR 30CC (CATHETERS) ×4 IMPLANT
CATH ROBINSON RED A/P 16FR (CATHETERS) ×4 IMPLANT
CATH ROBINSON RED A/P 8FR (CATHETERS) ×4 IMPLANT
CATH TIEMANN FOLEY 18FR 5CC (CATHETERS) ×4 IMPLANT
CHLORAPREP W/TINT 26ML (MISCELLANEOUS) ×4 IMPLANT
CLIP LIGATING HEM O LOK PURPLE (MISCELLANEOUS) ×14 IMPLANT
COVER SURGICAL LIGHT HANDLE (MISCELLANEOUS) ×2 IMPLANT
COVER TIP SHEARS 8 DVNC (MISCELLANEOUS) ×2 IMPLANT
COVER TIP SHEARS 8MM DA VINCI (MISCELLANEOUS) ×4
CUTTER ECHEON FLEX ENDO 45 340 (ENDOMECHANICALS) ×4 IMPLANT
DECANTER SPIKE VIAL GLASS SM (MISCELLANEOUS) ×4 IMPLANT
DRAPE ARM DVNC X/XI (DISPOSABLE) ×8 IMPLANT
DRAPE COLUMN DVNC XI (DISPOSABLE) ×2 IMPLANT
DRAPE DA VINCI XI ARM (DISPOSABLE) ×8
DRAPE DA VINCI XI COLUMN (DISPOSABLE) ×2
DRAPE SURG IRRIG POUCH 19X23 (DRAPES) ×4 IMPLANT
DRSG TEGADERM 4X4.75 (GAUZE/BANDAGES/DRESSINGS) ×4 IMPLANT
ELECT REM PT RETURN 9FT ADLT (ELECTROSURGICAL) ×4
ELECTRODE REM PT RTRN 9FT ADLT (ELECTROSURGICAL) ×2 IMPLANT
GLOVE BIO SURGEON STRL SZ 6.5 (GLOVE) ×3 IMPLANT
GLOVE BIO SURGEONS STRL SZ 6.5 (GLOVE) ×1
GLOVE BIOGEL M STRL SZ7.5 (GLOVE) ×8 IMPLANT
GOWN STRL REUS W/TWL LRG LVL3 (GOWN DISPOSABLE) ×12 IMPLANT
HOLDER FOLEY CATH W/STRAP (MISCELLANEOUS) ×4 IMPLANT
IV LACTATED RINGERS 1000ML (IV SOLUTION) ×4 IMPLANT
LIQUID BAND (GAUZE/BANDAGES/DRESSINGS) ×2 IMPLANT
NDL SAFETY ECLIPSE 18X1.5 (NEEDLE) ×2 IMPLANT
NEEDLE HYPO 18GX1.5 SHARP (NEEDLE) ×4
PACK ROBOT UROLOGY CUSTOM (CUSTOM PROCEDURE TRAY) ×4 IMPLANT
POUCH ENDO CATCH II 15MM (MISCELLANEOUS) ×2 IMPLANT
POUCH SPECIMEN RETRIEVAL 10MM (ENDOMECHANICALS) ×4 IMPLANT
RELOAD GREEN ECHELON 45 (STAPLE) ×4 IMPLANT
SEAL CANN UNIV 5-8 DVNC XI (MISCELLANEOUS) ×8 IMPLANT
SEAL XI 5MM-8MM UNIVERSAL (MISCELLANEOUS) ×8
SET TUBE IRRIG SUCTION NO TIP (IRRIGATION / IRRIGATOR) ×4 IMPLANT
SOLUTION ANTI FOG 6CC (MISCELLANEOUS) ×2 IMPLANT
SOLUTION ELECTROLUBE (MISCELLANEOUS) ×4 IMPLANT
SUT ETHILON 3 0 PS 1 (SUTURE) ×4 IMPLANT
SUT MNCRL 3 0 RB1 (SUTURE) ×2 IMPLANT
SUT MNCRL 3 0 VIOLET RB1 (SUTURE) ×2 IMPLANT
SUT MNCRL AB 4-0 PS2 18 (SUTURE) ×8 IMPLANT
SUT MONOCRYL 3 0 RB1 (SUTURE) ×4
SUT VIC AB 0 CT1 27 (SUTURE) ×4
SUT VIC AB 0 CT1 27XBRD ANTBC (SUTURE) ×2 IMPLANT
SUT VIC AB 0 UR5 27 (SUTURE) ×4 IMPLANT
SUT VIC AB 2-0 SH 27 (SUTURE) ×4
SUT VIC AB 2-0 SH 27X BRD (SUTURE) ×2 IMPLANT
SUT VIC AB 3-0 SH 27 (SUTURE) ×8
SUT VIC AB 3-0 SH 27XBRD (SUTURE) IMPLANT
SUT VICRYL 0 UR6 27IN ABS (SUTURE) ×8 IMPLANT
SYR 27GX1/2 1ML LL SAFETY (SYRINGE) ×4 IMPLANT
TOWEL OR 17X26 10 PK STRL BLUE (TOWEL DISPOSABLE) ×4 IMPLANT
TOWEL OR NON WOVEN STRL DISP B (DISPOSABLE) ×4 IMPLANT
TUBING INSUFFLATION 10FT LAP (TUBING) IMPLANT
WATER STERILE IRR 1500ML POUR (IV SOLUTION) ×6 IMPLANT

## 2016-01-26 NOTE — Anesthesia Procedure Notes (Addendum)
Procedure Name: Intubation Date/Time: 01/26/2016 7:27 AM Performed by: West Pugh Pre-anesthesia Checklist: Patient identified Patient Re-evaluated:Patient Re-evaluated prior to inductionOxygen Delivery Method: Circle system utilized Preoxygenation: Pre-oxygenation with 100% oxygen Intubation Type: IV induction Ventilation: Mask ventilation without difficulty Laryngoscope Size: Glidescope and 3 Grade View: Grade I Tube type: Oral Tube size: 7.5 mm Number of attempts: 2 Airway Equipment and Method: Stylet Placement Confirmation: ETT inserted through vocal cords under direct vision,  positive ETCO2,  CO2 detector and breath sounds checked- equal and bilateral Secured at: 24 cm Tube secured with: Tape Dental Injury: Teeth and Oropharynx as per pre-operative assessment  Comments: Pt has had previous cervical disc surgery. Elective glidescope intubation

## 2016-01-26 NOTE — Progress Notes (Signed)
Hgb. And Hct. And B Met drawn by lab. 

## 2016-01-26 NOTE — Anesthesia Postprocedure Evaluation (Signed)
Anesthesia Post Note  Patient: Casey Greene  Procedure(s) Performed: Procedure(s) (LRB): XI ROBOTIC ASSISTED LAPAROSCOPIC RADICAL PROSTATECTOMY LEVEL 3 (N/A) EXTENDED BILATERAL PELVIC LYMPHADENECTOMY (Bilateral)  Patient location during evaluation: PACU Anesthesia Type: General Level of consciousness: awake and alert Pain management: pain level controlled Vital Signs Assessment: post-procedure vital signs reviewed and stable Respiratory status: spontaneous breathing, nonlabored ventilation, respiratory function stable and patient connected to nasal cannula oxygen Cardiovascular status: blood pressure returned to baseline and stable Postop Assessment: no signs of nausea or vomiting Anesthetic complications: no Comments: Nitroglycerin infusion weaned off in PACU.  Patient BP <160/90.  Pain under control.    Last Vitals:  Filed Vitals:   01/26/16 1540 01/26/16 1545  BP: 174/106 180/105  Pulse: 83 86  Temp:  36.4 C  Resp: 17 17    Last Pain:  Filed Vitals:   01/26/16 1550  PainSc: 0-No pain                 Catalina Gravel

## 2016-01-26 NOTE — Progress Notes (Signed)
Dr. Gifford Shave in to see patient- aware of vital signs and NTG drip remaining at 52mcg/min- on pump- Apresoline 10 mg IVP given as ordered by Dr. Gifford Shave.

## 2016-01-26 NOTE — Progress Notes (Signed)
Dr. Gifford Shave in- made aware of patient's blood pressures- NTG DRIP PAUSED

## 2016-01-26 NOTE — Op Note (Signed)
Preoperative diagnosis: Clinically localized adenocarcinoma of the prostate (clinical stage cT2c N0 M0)  Postoperative diagnosis: Clinically localized adenocarcinoma of the prostate (clinical stage cT2c N0 M0)  Procedure:  1. Robotic assisted laparoscopic radical prostatectomy (non nerve sparing) 2. Bilateral robotic assisted laparoscopic pelvic lymphadenectomy (extended)  Surgeon: Pryor Curia. M.D.  Assistant(s): Debbrah Alar, [name]  Resident: Dr. Jearld Adjutant  Anesthesia: General  Complications: None  EBL: 175 mL  IVF:  1300 mL crystalloid  Specimens: 1. Prostate and seminal vesicles 2. Right pelvic lymph nodes 3. Left pelvic lymph nodes  Disposition of specimens: Pathology  Drains: 1. 20 Fr coude catheter 2. # 19 Blake pelvic drain  Indication: Casey Greene is a 65 y.o. year old patient with clinically localized prostate cancer.  After a thorough review of the management options for treatment of prostate cancer, he elected to proceed with surgical therapy and the above procedure(s).  We have discussed the potential benefits and risks of the procedure, side effects of the proposed treatment, the likelihood of the patient achieving the goals of the procedure, and any potential problems that might occur during the procedure or recuperation. Informed consent has been obtained.  Description of procedure:  The patient was taken to the operating room and a general anesthetic was administered. He was given preoperative antibiotics, placed in the dorsal lithotomy position, and prepped and draped in the usual sterile fashion. Next a preoperative timeout was performed. A urethral catheter was placed into the bladder and a site was selected near the umbilicus for placement of the camera port. This was placed using a standard open Hassan technique which allowed entry into the peritoneal cavity under direct vision and without difficulty. A 12 mm port was placed and a  pneumoperitoneum established. The camera was then used to inspect the abdomen and there was no evidence of any intra-abdominal injuries or other abnormalities. The remaining abdominal ports were then placed. 8 mm robotic ports were placed in the right lower quadrant, left lower quadrant, and far left lateral abdominal wall. A 5 mm port was placed in the right upper quadrant and a 12 mm port was placed in the right lateral abdominal wall for laparoscopic assistance. All ports were placed under direct vision without difficulty. The surgical cart was then docked.   Utilizing the cautery scissors, the bladder was reflected posteriorly allowing entry into the space of Retzius and identification of the endopelvic fascia and prostate. The periprostatic fat was then removed from the prostate allowing full exposure of the endopelvic fascia. The endopelvic fascia was then incised from the apex back to the base of the prostate bilaterally and the underlying levator muscle fibers were swept laterally off the prostate thereby isolating the dorsal venous complex. The dorsal vein was then stapled and divided with a 45 mm Flex Echelon stapler. Attention then turned to the bladder neck which was divided anteriorly thereby allowing entry into the bladder and exposure of the urethral catheter. The patient was known to have an extremely large prostate with a large median lobe distorting the normal bladder neck anatomy.  Sodium fluorescin was used to help identify the ureters. The catheter balloon was deflated and the catheter was brought into the operative field and used to retract the prostate anteriorly. The posterior bladder neck was then examined and was divided allowing further dissection between the bladder and prostate posteriorly until the vasa deferentia and seminal vessels were identified. The vasa deferentia were isolated, divided, and lifted anteriorly. The seminal vesicles were  dissected down to their tips with care to  control the seminal vascular arterial blood supply. These structures were then lifted anteriorly and the space between Denonvillier's fascia and the anterior rectum was developed with a combination of sharp and blunt dissection. This isolated the vascular pedicles of the prostate.  A wide non nerve sparing dissection was performed with Weck clips used to ligate the vascular pedicles of the prostate bilaterally. The vascular pedicles of the prostate were then divided.  The urethra was then sharply transected allowing the prostate specimen to be disarticulated. The pelvis was copiously irrigated and hemostasis was ensured. There was no evidence for rectal injury.  Attention then turned to the right pelvic sidewall. The fibrofatty tissue between the external iliac vein, confluence of the iliac vessels, hypogastric artery, and Cooper's ligament was dissected free from the pelvic sidewall with care to preserve the obturator nerve. Weck clips were used for lymphostasis and hemostasis. An identical procedure was performed on the contralateral side and the lymphatic packets were removed for permanent pathologic analysis.  Attention then turned to the urethral anastomosis. A 2-0 Vicryl slip knot was placed between Denonvillier's fascia, the posterior bladder neck, and the posterior urethra to reapproximate these structures. A double-armed 3-0 Monocryl suture was then used to perform a 360 running tension-free anastomosis between the bladder neck and urethra. A new urethral catheter was then placed into the bladder and irrigated. There were no blood clots within the bladder and the anastomosis appeared to be watertight. A #19 Blake drain was then brought through the left lateral 8 mm port site and positioned appropriately within the pelvis. It was secured to the skin with a nylon suture. The surgical cart was then undocked. The right lateral 12 mm port site was closed at the fascial level with a 0 Vicryl suture  placed laparoscopically. All remaining ports were then removed under direct vision. The prostate specimen was removed intact within the Endopouch retrieval bag via the periumbilical camera port site. This fascial opening was closed with two running 0 Vicryl sutures. 0.25% Marcaine was then injected into all port sites and all incisions were reapproximated at the skin level with 3-0 Monocryl subcuticular sutures. Dermabond was applied to the skin. The patient appeared to tolerate the procedure well and without complications. The patient was able to be extubated and transferred to the recovery unit in satisfactory condition.  Pryor Curia MD

## 2016-01-26 NOTE — Progress Notes (Signed)
Dr. Gifford Shave in to see patient- O.K. To go to floor.

## 2016-01-26 NOTE — Progress Notes (Signed)
Hgb. And Hct.  And B Met results noted- Leta Baptist in- made aware of labs

## 2016-01-26 NOTE — Progress Notes (Signed)
Post-op note  Subjective: The patient is still in PACU and very sleepy.   BP 159/90 with nitro drip off approx 20 minutes. Given Apresoline prior to turning drip off.    Objective: Vital signs in last 24 hours: Temp:  [97.4 F (36.3 C)-97.9 F (36.6 C)] 97.9 F (36.6 C) (03/02 1445) Pulse Rate:  [68-95] 82 (03/02 1510) Resp:  [8-27] 11 (03/02 1510) BP: (152-213)/(90-119) 165/95 mmHg (03/02 1510) SpO2:  [99 %-100 %] 100 % (03/02 1510) Weight:  [92.987 kg (205 lb)] 92.987 kg (205 lb) (03/02 0601)  Intake/Output from previous day:   Intake/Output this shift: Total I/O In: 3005 [I.V.:2005; IV Piggyback:1000] Out: 675 [Urine:500; Blood:175]  Physical Exam:  General: Alert and oriented. Abdomen: Soft, Nondistended. Incisions: Clean and dry. Urine: yellow  Lab Results:  Recent Labs  01/26/16 1311  HGB 14.0  HCT 41.1    Assessment/Plan: POD#0   1) Continue to monitor  2) BP still elevated; pt poorly controlled pre-operatively and likely non compliant.  Continue to monitor closely and leave off nitro drip if possible.  If need continues pt will have to go to step down unit.  2) DVT prophy, clears, IS, amb, pain control   LOS: 0 days   Netasha Wehrli 01/26/2016, 3:18 PM

## 2016-01-26 NOTE — Progress Notes (Signed)
Utilization review completed.  

## 2016-01-26 NOTE — Progress Notes (Signed)
Dr. Alinda Money in and  Talked with patient.

## 2016-01-26 NOTE — Discharge Instructions (Signed)

## 2016-01-26 NOTE — Transfer of Care (Signed)
Immediate Anesthesia Transfer of Care Note  Patient: Casey Greene  Procedure(s) Performed: Procedure(s): XI ROBOTIC ASSISTED LAPAROSCOPIC RADICAL PROSTATECTOMY LEVEL 3 (N/A) EXTENDED BILATERAL PELVIC LYMPHADENECTOMY (Bilateral)  Patient Location: PACU  Anesthesia Type:General  Level of Consciousness: awake and alert   Airway & Oxygen Therapy: Patient Spontanous Breathing and Patient connected to face mask oxygen  Post-op Assessment: Report given to RN  Post vital signs: Reviewed and stable,pt on a ntg drip for BP management.  Last Vitals:  Filed Vitals:   01/26/16 0540 01/26/16 0619  BP: 198/108 189/110  Pulse: 95   Temp: 36.3 C   Resp: 16     Complications: No apparent anesthesia complications

## 2016-01-26 NOTE — Anesthesia Preprocedure Evaluation (Addendum)
Anesthesia Evaluation  Patient identified by MRN, date of birth, ID band Patient awake    Reviewed: Allergy & Precautions, NPO status , Patient's Chart, lab work & pertinent test results  Airway Mallampati: II  TM Distance: >3 FB Neck ROM: Full    Dental  (+) Teeth Intact, Dental Advisory Given   Pulmonary former smoker,    Pulmonary exam normal breath sounds clear to auscultation       Cardiovascular Exercise Tolerance: Good hypertension, Pt. on medications (-) angina(-) Past MI Normal cardiovascular exam Rhythm:Regular Rate:Normal     Neuro/Psych negative neurological ROS  negative psych ROS   GI/Hepatic negative GI ROS, (+) Hepatitis -, C  Endo/Other  negative endocrine ROS  Renal/GU negative Renal ROS   Prostate cancer, urinary retention    Musculoskeletal negative musculoskeletal ROS (+)   Abdominal   Peds  Hematology negative hematology ROS (+)   Anesthesia Other Findings Day of surgery medications reviewed with the patient.  Reproductive/Obstetrics                           Anesthesia Physical Anesthesia Plan  ASA: III  Anesthesia Plan: General   Post-op Pain Management:    Induction: Intravenous  Airway Management Planned: Oral ETT  Additional Equipment:   Intra-op Plan:   Post-operative Plan: Extubation in OR  Informed Consent: I have reviewed the patients History and Physical, chart, labs and discussed the procedure including the risks, benefits and alternatives for the proposed anesthesia with the patient or authorized representative who has indicated his/her understanding and acceptance.   Dental advisory given  Plan Discussed with: CRNA  Anesthesia Plan Comments: (Risks/benefits of general anesthesia discussed with patient including risk of damage to teeth, lips, gum, and tongue, nausea/vomiting, allergic reactions to medications, and the possibility of heart  attack, stroke and death.  All patient questions answered.  Patient wishes to proceed.)        Anesthesia Quick Evaluation

## 2016-01-27 LAB — BASIC METABOLIC PANEL
ANION GAP: 10 (ref 5–15)
ANION GAP: 10 (ref 5–15)
BUN: 37 mg/dL — ABNORMAL HIGH (ref 6–20)
BUN: 41 mg/dL — ABNORMAL HIGH (ref 6–20)
CALCIUM: 8.3 mg/dL — AB (ref 8.9–10.3)
CALCIUM: 8.1 mg/dL — AB (ref 8.9–10.3)
CO2: 26 mmol/L (ref 22–32)
CO2: 24 mmol/L (ref 22–32)
CREATININE: 2.92 mg/dL — AB (ref 0.61–1.24)
CREATININE: 3.15 mg/dL — AB (ref 0.61–1.24)
Chloride: 99 mmol/L — ABNORMAL LOW (ref 101–111)
Chloride: 98 mmol/L — ABNORMAL LOW (ref 101–111)
GFR, EST AFRICAN AMERICAN: 24 mL/min — AB (ref >60)
GFR, EST AFRICAN AMERICAN: 22 mL/min — AB (ref >60)
GFR, EST NON AFRICAN AMERICAN: 21 mL/min — AB (ref >60)
GFR, EST NON AFRICAN AMERICAN: 19 mL/min — AB (ref >60)
Glucose, Bld: 172 mg/dL — ABNORMAL HIGH (ref 65–99)
Glucose, Bld: 135 mg/dL — ABNORMAL HIGH (ref 65–99)
Potassium: 3.7 mmol/L (ref 3.5–5.1)
Potassium: 3.8 mmol/L (ref 3.5–5.1)
SODIUM: 135 mmol/L (ref 135–145)
Sodium: 132 mmol/L — ABNORMAL LOW (ref 135–145)

## 2016-01-27 LAB — HEMOGLOBIN AND HEMATOCRIT, BLOOD
HEMATOCRIT: 35.4 % — AB (ref 39.0–52.0)
HEMOGLOBIN: 12.2 g/dL — AB (ref 13.0–17.0)

## 2016-01-27 MED ORDER — HYDROCODONE-ACETAMINOPHEN 5-325 MG PO TABS
1.0000 | ORAL_TABLET | Freq: Four times a day (QID) | ORAL | Status: DC | PRN
  Administered 2016-01-28: 2 via ORAL
  Filled 2016-01-27 (×2): qty 2

## 2016-01-27 MED ORDER — METOPROLOL TARTRATE 25 MG PO TABS: 25.0000 mg | ORAL_TABLET | Freq: Two times a day (BID) | ORAL | Status: DC

## 2016-01-27 MED ORDER — BOOST / RESOURCE BREEZE PO LIQD
1.0000 | Freq: Three times a day (TID) | ORAL | Status: DC
  Administered 2016-01-27: 1 via ORAL

## 2016-01-27 MED ORDER — BISACODYL 10 MG RE SUPP
10.0000 mg | Freq: Once | RECTAL | Status: AC
  Administered 2016-01-27: 10 mg via RECTAL
  Filled 2016-01-27: qty 1

## 2016-01-27 MED ORDER — VITAMINS A & D EX OINT
TOPICAL_OINTMENT | CUTANEOUS | Status: AC
  Administered 2016-01-27: 5
  Filled 2016-01-27: qty 5

## 2016-01-27 MED ORDER — DEXTROSE-NACL 5-0.9 % IV SOLN
INTRAVENOUS | Status: DC
  Administered 2016-01-27 – 2016-01-28 (×3): via INTRAVENOUS

## 2016-01-27 NOTE — Progress Notes (Signed)
Patient ID: Casey Greene, male   DOB: [birth year], 65 y.o.   MRN: [id number]  Pt doing well.  Ambulating and tolerating diet.  BP has been controlled on metoprolol.  UOP has decreased some.  Cr increased to 3.15.    ? ATN - patient had long procedure yesterday and fluid was somewhat restricted during surgery.  Will increase IVF tonight and recheck renal function in morning.  If Cr improved, ok to discharge home and f/u as an outpatient.  If renal function continues to worsen significantly, may need nephrology consultation.

## 2016-01-27 NOTE — Progress Notes (Signed)
Patient was leaking around the catheter. Clot noted in catheter tube. Foley was irrigated with 150cc of normal saline  and 150 output removed. Tolerated well.

## 2016-01-27 NOTE — Progress Notes (Signed)
Post-op note  Subjective: The patient is doing well.  No complaints.  He has some bloody discharge around the catheter after first getting out of bed this morning.  RN irrigated cath without difficulty and no clot return.  No discharge since.  UO marginal with 275cc out since 7am and IVF at 75cc/hr.  Cr trending up with last check at 3.15.  No flatus.  No N/V.  Objective: Vital signs in last 24 hours: Temp:  [97.6 F (36.4 C)-98.2 F (36.8 C)] 98.2 F (36.8 C) (03/03 1325) Pulse Rate:  [56-92] 63 (03/03 1325) Resp:  [16-18] 16 (03/03 1325) BP: (131-185)/(83-106) 146/85 mmHg (03/03 1325) SpO2:  [100 %] 100 % (03/03 1325) Weight:  [91.1 kg (200 lb 13.4 oz)] 91.1 kg (200 lb 13.4 oz) (03/02 1608)  Intake/Output from previous day: 03/02 0701 - 03/03 0700 In: [id number] [P.O.:240; I.V.:4698; IV Piggyback:1450] Out: 2815 [Urine:2445; Drains:195; Blood:175] Intake/Output this shift: Total I/O In: 840 [P.O.:840] Out: 220 [Urine:200; Drains:20]  Physical Exam:  General: Alert and oriented. Abdomen: Soft, Nondistended. Incisions: Clean and dry. Urine: dark yellow   Lab Results:  Recent Labs  01/26/16 1311 01/27/16 0523  HGB 14.0 12.2*  HCT 41.1 35.4*    Assessment/Plan: POD#0   1) Continue to monitor  2) Amb  3) monitor UO and Cr; repeat BMP in am  4) Possible d/c tomorrow   5)BP improved on metoprolol.  Continue to hold losartan for now due to Cr.    LOS: 1 day   Casey Greene 01/27/2016, 3:39 PM

## 2016-01-27 NOTE — Addendum Note (Signed)
Addendum  created 01/27/16 0730 by Lollie Sails, CRNA   Modules edited: Charges VN

## 2016-01-27 NOTE — Progress Notes (Signed)
Patient ID: Casey Greene, male   DOB: 1950/12/27, 65 y.o.   MRN: [id number]  1 Day Post-Op Subjective: The patient is doing well.  No nausea or vomiting. Pain is adequately controlled.  Pt BP well controlled overnight.  Started on metoprolol last evening.   Objective: Vital signs in last 24 hours: Temp:  [97.6 F (36.4 C)-98.2 F (36.8 C)] 98.2 F (36.8 C) (03/03 0635) Pulse Rate:  [56-92] 56 (03/03 0635) Resp:  [8-27] 18 (03/03 0635) BP: (139-213)/(83-119) 139/83 mmHg (03/03 0635) SpO2:  [100 %] 100 % (03/03 0635) Weight:  [91.1 kg (200 lb 13.4 oz)] 91.1 kg (200 lb 13.4 oz) (03/02 1608)  Intake/Output from previous day: 03/02 0701 - 03/03 0700 In: [id number] [P.O.:240; I.V.:4698; IV Piggyback:1450] Out: 2815 [Urine:2445; Drains:195; Blood:175] Intake/Output this shift:    Physical Exam:  General: Alert and oriented. CV: RRR Lungs: Clear bilaterally. GI: Soft, Nondistended., No CVAT Incisions: Clean, dry, and intact Urine: Clear Extremities: Nontender, no erythema, no edema.  Lab Results:  Recent Labs  01/26/16 1311 01/27/16 0523  HGB 14.0 12.2*  HCT 41.1 35.4*   Lab Results  Component Value Date   CREATININE 2.92* 01/27/2016   CREATININE 1.80* 01/26/2016   CREATININE 2.07* 01/12/2016   BMP Latest Ref Rng 01/27/2016 01/26/2016 01/12/2016  Glucose 65 - 99 mg/dL 172(H) 126(H) 116(H)  BUN 6 - 20 mg/dL 37(H) 29(H) 37(H)  Creatinine 0.61 - 1.24 mg/dL 2.92(H) 1.80(H) 2.07(H)  Sodium 135 - 145 mmol/L 135 136 138  Potassium 3.5 - 5.1 mmol/L 3.7 3.2(L) 3.1(L)  Chloride 101 - 111 mmol/L 99(L) 98(L) 100(L)  CO2 22 - 32 mmol/L 26 25 30   Calcium 8.9 - 10.3 mg/dL 8.3(L) 8.6(L) 9.2       Assessment/Plan: POD# 1 s/p robotic prostatectomy.  1) Increased Cr: Baseline CKD with Cr of 2.0 but increased to 2.9 this morning.  Will continue IVF and hold Losartan.  Recheck Cr later today. 2) Ambulate, Incentive spirometry 3) Transition to oral pain medication 4) Dulcolax  suppository 5) D/C pelvic drain 6) Hypertension: BP well controlled overnight especially compared to baseline.  Will need to hold Losartan due to rise of Cr.  Continue metoprolol and prn hydralazine and will adjust oral therapy as needed. 7) Disp: Will re-evaluate renal function and BP later today. If improved/controlled, will d/c home late afternoon or this evening.  Otherwise, may need to delay discharge until tomorrow.   Roxy Horseman, Brooke Bonito. MD   LOS: 1 day   Deonna Krummel,LES 01/27/2016, 7:23 AM

## 2016-01-28 LAB — BASIC METABOLIC PANEL
ANION GAP: 12 (ref 5–15)
BUN: 39 mg/dL — ABNORMAL HIGH (ref 6–20)
CALCIUM: 8.4 mg/dL — AB (ref 8.9–10.3)
CO2: 22 mmol/L (ref 22–32)
CREATININE: 2.49 mg/dL — AB (ref 0.61–1.24)
Chloride: 102 mmol/L (ref 101–111)
GFR, EST AFRICAN AMERICAN: 29 mL/min — AB (ref >60)
GFR, EST NON AFRICAN AMERICAN: 25 mL/min — AB (ref >60)
Glucose, Bld: 119 mg/dL — ABNORMAL HIGH (ref 65–99)
Potassium: 3.5 mmol/L (ref 3.5–5.1)
SODIUM: 136 mmol/L (ref 135–145)

## 2016-01-28 NOTE — Discharge Summary (Signed)
Physician Discharge Summary  Patient ID: Casey Greene MRN: [id number] DOB/AGE: 1951/07/24 65 y.o.  Admit date: 01/26/2016 Discharge date: 01/28/2016  Admission Diagnoses:  Discharge Diagnoses:  Active Problems:   Prostate cancer [facility])   Discharged Condition: good  Hospital Course: He was diagnosed with adenocarcinoma of the prostate and underwent a robotic-assisted radical retropubic prostatectomy. He did well from urologic standpoint but was having issues with uncontrolled hypertension. He takes losartan at home and has some renal dysfunction with a baseline creatinine of about 2. Metoprolol was started during his hospitalization and his losartan was stopped due to a rising creatinine. He was noted to have some decrease in his urine output and therefore increased fluids were administered with improvement of his creatinine from 3.15-2.49 the following morning. At this point with an improved creatinine with hydration and stopping losartan he is felt to be ready for discharge at this time. He is tolerating regular diet and has no significant pain.   Discharge Exam: Blood pressure 173/85, pulse 90, temperature 98.7 F (37.1 C), temperature source Oral, resp. rate 18, height 5\' 10"  (1.778 m), weight 91.1 kg (200 lb 13.4 oz), SpO2 99 %. General appearance: alert, cooperative and no distress  His abdomen is flat and soft. It is nontender. Positive bowel sounds.   port sites look good with no sign of infection. Foley catheter is draining clear urine.  Disposition: 01-Home or Self Care     Medication List    TAKE these medications        acetaminophen 500 MG tablet  Commonly known as:  TYLENOL  Take 500 mg by mouth every 6 (six) hours as needed for mild pain.     HYDROcodone-acetaminophen 5-325 MG tablet  Commonly known as:  NORCO  Take 1-2 tablets by mouth every 6 (six) hours as needed.     losartan 100 MG tablet  Commonly known as:  COZAAR  Take 100 mg by mouth every morning.      metoprolol tartrate 25 MG tablet  Commonly known as:  LOPRESSOR  Take 1 tablet (25 mg total) by mouth 2 (two) times daily.     sulfamethoxazole-trimethoprim 800-160 MG tablet  Commonly known as:  BACTRIM DS,SEPTRA DS  Take 1 tablet by mouth 2 (two) times daily. Start the day prior to foley removal appointment           Follow-up Information    Follow up with BORDEN,LES, MD On 02/07/2016.   Specialty:  Urology   Why:  at 9:15   Contact information:   [place] [address] [phone]       Signed: Claybon Jabs 01/28/2016, 8:11 AM

## 2016-02-27 DIAGNOSIS — F329 Major depressive disorder, single episode, unspecified: Secondary | ICD-10-CM

## 2016-02-27 HISTORY — DX: Major depressive disorder, single episode, unspecified: F32.9

## 2016-04-17 ENCOUNTER — Other Ambulatory Visit (HOSPITAL_COMMUNITY): Payer: Self-pay | Admitting: Internal Medicine

## 2016-04-17 DIAGNOSIS — B1920 Unspecified viral hepatitis C without hepatic coma: Secondary | ICD-10-CM

## 2016-04-24 ENCOUNTER — Ambulatory Visit (HOSPITAL_COMMUNITY): Payer: Medicare HMO | Admitting: Psychiatry

## 2016-04-25 ENCOUNTER — Ambulatory Visit (HOSPITAL_COMMUNITY)
Admission: RE | Admit: 2016-04-25 | Discharge: 2016-04-25 | Disposition: A | Discharge status: Home / Self Care | Payer: Medicare HMO | Source: Ambulatory Visit | Attending: Internal Medicine | Admitting: Internal Medicine

## 2016-04-25 DIAGNOSIS — B1920 Unspecified viral hepatitis C without hepatic coma: Secondary | ICD-10-CM | POA: Insufficient documentation

## 2016-07-04 ENCOUNTER — Telehealth: Payer: Self-pay | Admitting: Medical Oncology

## 2016-07-04 ENCOUNTER — Encounter: Payer: Self-pay | Admitting: Medical Oncology

## 2016-07-04 NOTE — Telephone Encounter (Signed)
I left a message asking for a return call regarding referral to the [facility] [place].

## 2016-07-06 ENCOUNTER — Telehealth: Payer: Self-pay | Admitting: Medical Oncology

## 2016-07-06 NOTE — Telephone Encounter (Signed)
Oncology Nurse Navigator Documentation  Oncology Nurse Navigator Flowsheets 07/04/2016 07/06/2016  Navigator Location [facility] -  Navigator Encounter Type - Introductory phone call  Abnormal Finding Date 06/21/2016 -  Confirmed Diagnosis Date 12/14/2015 -  Surgery Date 01/26/2016 -  Barriers/Navigation Needs - Coordination of Care- Pt states that he had a liver biopsy 06/29/16  due to an autoimmune disease. He has not heard from the biopsy and asked if I can call and get a copy of the pathology for the clinic.  Interventions - Coordination of Care  Coordination of Care - Other  Support Groups/Services - Friends and Family  Acuity - Level 1  Acuity Level 1 - Initial guidance, education and coordination as needed  Time Spent with Patient - 15  I called pt to introduce myself as the Prostate Nurse Navigator and the Coordinator of the [facility].  1. I confirmed with the patient he is aware of his referral to the clinic August 22 arriving at 12:15pm. I asked him to have lunch before he arrives due to the length of the clinic. 2. I discussed the format of the clinic and the physicians he will be seeing that day.  3. I discussed where the clinic is located and how to contact me.  4. I confirmed his address and informed him I would be mailing a packet of information and forms to be completed. I asked him to bring them with him the day of his appointment.   He voiced understanding of the above. I asked him to call me if he has any questions or concerns regarding his appointments or the forms he needs to complete.

## 2016-07-08 ENCOUNTER — Emergency Department (HOSPITAL_COMMUNITY): Payer: Medicare HMO

## 2016-07-08 ENCOUNTER — Encounter (HOSPITAL_COMMUNITY): Payer: Self-pay | Admitting: Emergency Medicine

## 2016-07-08 ENCOUNTER — Emergency Department (HOSPITAL_COMMUNITY)
Admission: EM | Admit: 2016-07-08 | Discharge: 2016-07-08 | Disposition: A | Discharge status: Home / Self Care | Payer: Medicare HMO | Attending: Emergency Medicine | Admitting: Emergency Medicine

## 2016-07-08 DIAGNOSIS — R609 Edema, unspecified: Secondary | ICD-10-CM

## 2016-07-08 DIAGNOSIS — R600 Localized edema: Secondary | ICD-10-CM | POA: Insufficient documentation

## 2016-07-08 DIAGNOSIS — I10 Essential (primary) hypertension: Secondary | ICD-10-CM | POA: Diagnosis not present

## 2016-07-08 DIAGNOSIS — Z87891 Personal history of nicotine dependence: Secondary | ICD-10-CM | POA: Diagnosis not present

## 2016-07-08 DIAGNOSIS — Z8546 Personal history of malignant neoplasm of prostate: Secondary | ICD-10-CM | POA: Insufficient documentation

## 2016-07-08 DIAGNOSIS — M7989 Other specified soft tissue disorders: Secondary | ICD-10-CM | POA: Diagnosis present

## 2016-07-08 DIAGNOSIS — Z79899 Other long term (current) drug therapy: Secondary | ICD-10-CM | POA: Diagnosis not present

## 2016-07-08 LAB — COMPREHENSIVE METABOLIC PANEL
ALBUMIN: 4.2 g/dL (ref 3.5–5.0)
ALT: 47 U/L (ref 17–63)
ANION GAP: 8 (ref 5–15)
AST: 61 U/L — ABNORMAL HIGH (ref 15–41)
Alkaline Phosphatase: 50 U/L (ref 38–126)
BILIRUBIN TOTAL: 0.8 mg/dL (ref 0.3–1.2)
BUN: 30 mg/dL — ABNORMAL HIGH (ref 6–20)
CALCIUM: 9.1 mg/dL (ref 8.9–10.3)
CO2: 28 mmol/L (ref 22–32)
Chloride: 103 mmol/L (ref 101–111)
Creatinine, Ser: 1.5 mg/dL — ABNORMAL HIGH (ref 0.61–1.24)
GFR, EST AFRICAN AMERICAN: 54 mL/min — AB (ref >60)
GFR, EST NON AFRICAN AMERICAN: 46 mL/min — AB (ref >60)
Glucose, Bld: 116 mg/dL — ABNORMAL HIGH (ref 65–99)
POTASSIUM: 3.1 mmol/L — AB (ref 3.5–5.1)
Sodium: 139 mmol/L (ref 135–145)
TOTAL PROTEIN: 8.6 g/dL — AB (ref 6.5–8.1)

## 2016-07-08 LAB — CBC WITH DIFFERENTIAL/PLATELET
BASOS ABS: 0 10*3/uL (ref 0.0–0.1)
BASOS PCT: 1 %
Eosinophils Absolute: 0.1 10*3/uL (ref 0.0–0.7)
Eosinophils Relative: 1 %
HEMATOCRIT: 36.9 % — AB (ref 39.0–52.0)
Hemoglobin: 12.7 g/dL — ABNORMAL LOW (ref 13.0–17.0)
LYMPHS PCT: 24 %
Lymphs Abs: 1.5 10*3/uL (ref 0.7–4.0)
MCH: 30.6 pg (ref 26.0–34.0)
MCHC: 34.4 g/dL (ref 30.0–36.0)
MCV: 88.9 fL (ref 78.0–100.0)
Monocytes Absolute: 0.4 10*3/uL (ref 0.1–1.0)
Monocytes Relative: 7 %
NEUTROS ABS: 4.2 10*3/uL (ref 1.7–7.7)
NEUTROS PCT: 67 %
Platelets: 188 10*3/uL (ref 150–400)
RBC: 4.15 MIL/uL — AB (ref 4.22–5.81)
RDW: 13.1 % (ref 11.5–15.5)
WBC: 6.3 10*3/uL (ref 4.0–10.5)

## 2016-07-08 LAB — URINALYSIS, ROUTINE W REFLEX MICROSCOPIC
Bilirubin Urine: NEGATIVE
GLUCOSE, UA: NEGATIVE mg/dL
Hgb urine dipstick: NEGATIVE
KETONES UR: NEGATIVE mg/dL
LEUKOCYTES UA: NEGATIVE
NITRITE: NEGATIVE
PH: 7.5 (ref 5.0–8.0)
PROTEIN: NEGATIVE mg/dL
Specific Gravity, Urine: 1.007 (ref 1.005–1.030)

## 2016-07-08 LAB — BRAIN NATRIURETIC PEPTIDE: B Natriuretic Peptide: 19 pg/mL (ref 0.0–100.0)

## 2016-07-08 MED ORDER — POTASSIUM CHLORIDE CRYS ER 20 MEQ PO TBCR: 20.0000 meq | EXTENDED_RELEASE_TABLET | Freq: Two times a day (BID) | ORAL | 0 refills | Status: DC

## 2016-07-08 MED ORDER — FUROSEMIDE 20 MG PO TABS: 20.0000 mg | ORAL_TABLET | Freq: Two times a day (BID) | ORAL | 0 refills | Status: DC

## 2016-07-08 NOTE — ED Provider Notes (Signed)
[facility] DEPT Provider Note   CSN: [id number] Arrival date & time: 07/08/16  0941  First Provider Contact:  None       History   Chief Complaint Chief Complaint  Patient presents with  . Leg Swelling    HPI Casey Greene is a 65 y.o. male.  The history is provided by the patient.  Patient presents with swelling in both his legs. He has had some mild swelling for a while but has been worse over the last week. No real change that time. No chest pain or trouble breathing. No fevers or chills. No recent change in medication. Has had a history of prostate cancer and had previous Foley catheter. Previous history of urinary retention but states he is able to urinate now. Also had recent liver biopsy to evaluate autoimmune hepatitis. States he does have bladder leakage.  Past Medical History:  Diagnosis Date  . Cancer [facility]) 1997   prostate  . Decreased appetite   . Foley catheter in place   . Hepatitis C   . History of gout   . Hypertension   . Slow heartbeat   . Urinary retention     Patient Active Problem List   Diagnosis Date Noted  . Prostate cancer ([place]) 01/26/2016  . Onychomycosis 12/01/2013  . Pain, foot 12/01/2013  . PVD (peripheral vascular disease) ([place]) 12/01/2013    Past Surgical History:  Procedure Laterality Date  . ACHILLES TENDON REPAIR    . CERVICAL DISC SURGERY     neck  . LYMPHADENECTOMY Bilateral 01/26/2016   Procedure: EXTENDED BILATERAL PELVIC LYMPHADENECTOMY;  Surgeon: Raynelle Bring, MD;  Location: [facility]S;  Service: Urology;  Laterality: Bilateral;  . NOSE SURGERY     in high school-nasal fracturex2  . ROBOT ASSISTED LAPAROSCOPIC RADICAL PROSTATECTOMY N/A 01/26/2016   Procedure: XI ROBOTIC ASSISTED LAPAROSCOPIC RADICAL PROSTATECTOMY LEVEL 3;  Surgeon: Raynelle Bring, MD;  Location: [facility] ORS;  Service: Urology;  Laterality: N/A;       Home Medications    Prior to Admission medications   Medication Sig Start Date End Date Taking? Authorizing  Provider  amLODipine (NORVASC) 10 MG tablet Take 10 mg by mouth daily. 06/22/16 06/22/17 Yes Historical Provider, MD  losartan (COZAAR) 100 MG tablet Take 100 mg by mouth every morning.   Yes Historical Provider, MD  furosemide (LASIX) 20 MG tablet Take 1 tablet (20 mg total) by mouth 2 (two) times daily. 07/08/16   Davonna Belling, MD  HYDROcodone-acetaminophen (NORCO) 5-325 MG tablet Take 1-2 tablets by mouth every 6 (six) hours as needed. Patient not taking: Reported on 07/08/2016 01/26/16   Debbrah Alar, [name]  metoprolol tartrate (LOPRESSOR) 25 MG tablet Take 1 tablet (25 mg total) by mouth 2 (two) times daily. Patient not taking: Reported on 07/08/2016 01/27/16   Debbrah Alar, [name]  potassium chloride SA (K-DUR,KLOR-CON) 20 MEQ tablet Take 1 tablet (20 mEq total) by mouth 2 (two) times daily. 07/08/16   Davonna Belling, MD  sulfamethoxazole-trimethoprim (BACTRIM DS,SEPTRA DS) 800-160 MG tablet Take 1 tablet by mouth 2 (two) times daily. Start the day prior to foley removal appointment Patient not taking: Reported on 07/08/2016 01/26/16   Debbrah Alar, [name]    Family History Family History  Problem Relation Age of Onset  . Heart disease Mother   . Obesity Mother   . Cervical cancer Mother   . Heart disease Father   . Diabetes Father   . ALS Sister   . Colon cancer Neg Hx  Social History Social History  Substance Use Topics  . Smoking status: Former Smoker    Quit date: 01/11/2006  . Smokeless tobacco: Never Used  . Alcohol use No     Allergies   Amlodipine   Review of Systems Review of Systems  Constitutional: Negative for activity change and appetite change.  HENT: Negative for trouble swallowing.   Eyes: Negative for pain.  Respiratory: Negative for chest tightness and shortness of breath.   Cardiovascular: Positive for leg swelling. Negative for chest pain.  Gastrointestinal: Negative for abdominal pain, diarrhea, nausea and vomiting.  Genitourinary: Negative for flank  pain and frequency.  Musculoskeletal: Negative for back pain and neck stiffness.  Skin: Negative for rash.  Neurological: Negative for weakness, numbness and headaches.  Psychiatric/Behavioral: Negative for behavioral problems.     Physical Exam Updated Vital Signs BP 149/91   Pulse 85   Temp 97.7 F (36.5 C) (Oral)   Resp 18   SpO2 100%   Physical Exam  Constitutional: He appears well-developed.  HENT:  Head: Normocephalic.  Eyes: EOM are normal.  Neck: Neck supple. No JVD present.  Cardiovascular: Normal rate.   Pulmonary/Chest: Effort normal.  Abdominal: Soft.  Musculoskeletal: He exhibits edema.  Pitting edema to bilateral feet and lower legs.  Neurological: He is alert.  Skin: Skin is warm.     ED Treatments / Results  Labs (all labs ordered are listed, but only abnormal results are displayed) Labs Reviewed  COMPREHENSIVE METABOLIC PANEL - Abnormal; Notable for the following:       Result Value   Potassium 3.1 (*)    Glucose, Bld 116 (*)    BUN 30 (*)    Creatinine, Ser 1.50 (*)    Total Protein 8.6 (*)    AST 61 (*)    GFR calc non Af Amer 46 (*)    GFR calc Af Amer 54 (*)    All other components within normal limits  CBC WITH DIFFERENTIAL/PLATELET - Abnormal; Notable for the following:    RBC 4.15 (*)    Hemoglobin 12.7 (*)    HCT 36.9 (*)    All other components within normal limits  URINALYSIS, ROUTINE W REFLEX MICROSCOPIC (NOT AT [facility])  BRAIN NATRIURETIC PEPTIDE    EKG  EKG Interpretation None       Radiology Dg Chest 2 View  Result Date: 07/08/2016 CLINICAL DATA:  Worsening peripheral edema for several days. Hypertension. Former smoker. Personal history of prostate carcinoma. EXAM: CHEST  2 VIEW COMPARISON:  01/12/2016 FINDINGS: The heart size and mediastinal contours are within normal limits. Both lungs are clear. The visualized skeletal structures are unremarkable. IMPRESSION: No active cardiopulmonary disease. Electronically Signed    By: Earle Gell M.D.   On: 07/08/2016 10:33    Procedures Procedures (including critical care time)  Medications Ordered in ED Medications - No data to display   Initial Impression / Assessment and Plan / ED Course  I have reviewed the triage vital signs and the nursing notes.  Pertinent labs & imaging results that were available during my care of the patient were reviewed by me and considered in my medical decision making (see chart for details).  Clinical Course     patient with swelling on his extemities. Labs reassuring. Mild post void residual. Increase lasix for 3 days and follow up.  Final Clinical Impressions(s) / ED Diagnoses   Final diagnoses:  Peripheral edema    New Prescriptions Discharge Medication List as of 07/08/2016  12:21 PM    START taking these medications   Details  potassium chloride SA (K-DUR,KLOR-CON) 20 MEQ tablet Take 1 tablet (20 mEq total) by mouth 2 (two) times daily., Starting Sun 07/08/2016, Print         Davonna Belling, MD 07/08/16 [phone]

## 2016-07-08 NOTE — ED Triage Notes (Signed)
Pt reports bilateral lower leg swelling for the past few months that has gradually gotten worse. No known injury or cardiac conditions. Leg swelling has happened before but not to this extent.

## 2016-07-09 DIAGNOSIS — R609 Edema, unspecified: Secondary | ICD-10-CM

## 2016-07-09 HISTORY — DX: Edema, unspecified: R60.9

## 2016-07-10 ENCOUNTER — Telehealth: Payer: Self-pay | Admitting: Medical Oncology

## 2016-07-10 NOTE — Telephone Encounter (Signed)
Oncology Nurse Navigator Documentation  Oncology Nurse Navigator Flowsheets 07/04/2016 07/06/2016 07/10/2016  Navigator Location [facility] - -  Navigator Encounter Type - Introductory phone call Telephone  Telephone - - Outgoing Call;Appt Confirmation/Clarification  Abnormal Finding Date 06/21/2016 - -  Confirmed Diagnosis Date 12/14/2015 - -  Surgery Date 01/26/2016 - -  Barriers/Navigation Needs - Coordination of Care -  Interventions - Coordination of Care -  Coordination of Care - Other Appts  Support Groups/Services - Friends and Family -  Acuity - Level 1 -  Acuity Level 1 - Initial guidance, education and coordination as needed -  Time Spent with Patient - 15 15   I spoke with Casey Greene to inform him Dr. Alinda Money wanted him to follow up in the [facility] [place] in November not next week. His appointment has been rescheduled to November 21.2017 arriving at 12:30pm. I did inform him that Dr. Lynne Logan office will call and reschedule his PSA lab draw to November also. He voiced understanding of the above.

## 2016-07-17 ENCOUNTER — Ambulatory Visit: Payer: Medicare HMO | Admitting: Radiation Oncology

## 2016-09-07 ENCOUNTER — Encounter (HOSPITAL_COMMUNITY): Payer: Self-pay | Admitting: *Deleted

## 2016-09-07 ENCOUNTER — Emergency Department (HOSPITAL_COMMUNITY)
Admission: EM | Admit: 2016-09-07 | Discharge: 2016-09-08 | Disposition: A | Discharge status: Home / Self Care | Payer: Medicare HMO | Source: Home / Self Care

## 2016-09-07 DIAGNOSIS — I10 Essential (primary) hypertension: Secondary | ICD-10-CM

## 2016-09-07 DIAGNOSIS — Z79899 Other long term (current) drug therapy: Secondary | ICD-10-CM | POA: Insufficient documentation

## 2016-09-07 DIAGNOSIS — R600 Localized edema: Secondary | ICD-10-CM | POA: Diagnosis not present

## 2016-09-07 DIAGNOSIS — Z859 Personal history of malignant neoplasm, unspecified: Secondary | ICD-10-CM

## 2016-09-07 DIAGNOSIS — Z8546 Personal history of malignant neoplasm of prostate: Secondary | ICD-10-CM | POA: Diagnosis not present

## 2016-09-07 DIAGNOSIS — M7989 Other specified soft tissue disorders: Secondary | ICD-10-CM | POA: Diagnosis present

## 2016-09-07 DIAGNOSIS — Z87891 Personal history of nicotine dependence: Secondary | ICD-10-CM | POA: Insufficient documentation

## 2016-09-07 DIAGNOSIS — Z5321 Procedure and treatment not carried out due to patient leaving prior to being seen by health care provider: Secondary | ICD-10-CM

## 2016-09-07 NOTE — ED Triage Notes (Signed)
Pt complains of bilateral lower leg swelling for the past week. Pt states he has had similar problems in past.   Pt states he is prescribed lasix but ran out last week.

## 2016-09-08 ENCOUNTER — Encounter (HOSPITAL_COMMUNITY): Payer: Self-pay | Admitting: Emergency Medicine

## 2016-09-08 ENCOUNTER — Emergency Department (HOSPITAL_COMMUNITY)
Admission: EM | Admit: 2016-09-08 | Discharge: 2016-09-08 | Disposition: A | Discharge status: Home / Self Care | Payer: Medicare HMO | Attending: Emergency Medicine | Admitting: Emergency Medicine

## 2016-09-08 ENCOUNTER — Emergency Department (HOSPITAL_BASED_OUTPATIENT_CLINIC_OR_DEPARTMENT_OTHER)
Admit: 2016-09-08 | Discharge: 2016-09-08 | Disposition: A | Discharge status: Home / Self Care | Payer: Medicare HMO | Attending: Student | Admitting: Student

## 2016-09-08 DIAGNOSIS — R609 Edema, unspecified: Secondary | ICD-10-CM

## 2016-09-08 DIAGNOSIS — Z8546 Personal history of malignant neoplasm of prostate: Secondary | ICD-10-CM | POA: Insufficient documentation

## 2016-09-08 DIAGNOSIS — M7989 Other specified soft tissue disorders: Secondary | ICD-10-CM | POA: Diagnosis not present

## 2016-09-08 DIAGNOSIS — I10 Essential (primary) hypertension: Secondary | ICD-10-CM | POA: Insufficient documentation

## 2016-09-08 DIAGNOSIS — Z79899 Other long term (current) drug therapy: Secondary | ICD-10-CM | POA: Insufficient documentation

## 2016-09-08 DIAGNOSIS — Z87891 Personal history of nicotine dependence: Secondary | ICD-10-CM | POA: Insufficient documentation

## 2016-09-08 DIAGNOSIS — R600 Localized edema: Secondary | ICD-10-CM | POA: Insufficient documentation

## 2016-09-08 LAB — BASIC METABOLIC PANEL
Anion gap: 8 (ref 5–15)
BUN: 33 mg/dL — ABNORMAL HIGH (ref 6–20)
CALCIUM: 8.9 mg/dL (ref 8.9–10.3)
CO2: 27 mmol/L (ref 22–32)
CREATININE: 1.45 mg/dL — AB (ref 0.61–1.24)
Chloride: 103 mmol/L (ref 101–111)
GFR calc non Af Amer: 48 mL/min — ABNORMAL LOW (ref >60)
GFR, EST AFRICAN AMERICAN: 56 mL/min — AB (ref >60)
Glucose, Bld: 92 mg/dL (ref 65–99)
Potassium: 3.2 mmol/L — ABNORMAL LOW (ref 3.5–5.1)
Sodium: 138 mmol/L (ref 135–145)

## 2016-09-08 LAB — CBC WITH DIFFERENTIAL/PLATELET
BASOS PCT: 1 %
Basophils Absolute: 0.1 10*3/uL (ref 0.0–0.1)
EOS ABS: 0.1 10*3/uL (ref 0.0–0.7)
Eosinophils Relative: 1 %
HCT: 37.6 % — ABNORMAL LOW (ref 39.0–52.0)
Hemoglobin: 12.6 g/dL — ABNORMAL LOW (ref 13.0–17.0)
Lymphocytes Relative: 26 %
Lymphs Abs: 1.5 10*3/uL (ref 0.7–4.0)
MCH: 30.7 pg (ref 26.0–34.0)
MCHC: 33.5 g/dL (ref 30.0–36.0)
MCV: 91.7 fL (ref 78.0–100.0)
MONO ABS: 0.4 10*3/uL (ref 0.1–1.0)
Monocytes Relative: 8 %
NEUTROS ABS: 3.5 10*3/uL (ref 1.7–7.7)
NEUTROS PCT: 64 %
PLATELETS: 116 10*3/uL — AB (ref 150–400)
RBC: 4.1 MIL/uL — ABNORMAL LOW (ref 4.22–5.81)
RDW: 12.8 % (ref 11.5–15.5)
WBC: 5.6 10*3/uL (ref 4.0–10.5)

## 2016-09-08 LAB — BRAIN NATRIURETIC PEPTIDE: B NATRIURETIC PEPTIDE 5: 32.6 pg/mL (ref 0.0–100.0)

## 2016-09-08 MED ORDER — POTASSIUM CHLORIDE CRYS ER 20 MEQ PO TBCR
20.0000 meq | EXTENDED_RELEASE_TABLET | Freq: Once | ORAL | Status: AC
  Administered 2016-09-08: 20 meq via ORAL
  Filled 2016-09-08: qty 1

## 2016-09-08 MED ORDER — FUROSEMIDE 40 MG PO TABS
20.0000 mg | ORAL_TABLET | Freq: Once | ORAL | Status: AC
  Administered 2016-09-08: 20 mg via ORAL
  Filled 2016-09-08: qty 1

## 2016-09-08 NOTE — ED Triage Notes (Signed)
Patient c/o "water retention in right foot that is now moved up into calf area".  Patient states that he believes PCP changing HTN meds is what is causing the "retention".  Patient states that pain is worse with ambulation.

## 2016-09-08 NOTE — Discharge Instructions (Signed)
Please pick up your high blood pressure medicine for the pharmacy today. I have given you your home dose of Lasix and potassium in ED. Continue take all medications as prescribed. Your lower extremity ultrasound was without blood clot. He is to follow up with your primary care doctor concerning your low platelets for recheck. Your potassium was slightly low in ED. Continue her home potassium as prescribed. Please return to the ED if your symptoms worsen or for any other reason.

## 2016-09-08 NOTE — Progress Notes (Signed)
VASCULAR LAB PRELIMINARY  PRELIMINARY  PRELIMINARY  PRELIMINARY  Right lower extremity venous duplex completed.    Preliminary report:  There is no DVT or SVT noted in the right lower extremity.  There is an enlarged lymph node noted in the right inguinal area.  There is interstitial fluid noted throughout the right calf.   Gave report to Ocie Cornfield, [name] and Dr. Baldemar Lenis, [facility], RVT 09/08/2016, 9:31 AM

## 2016-09-08 NOTE — ED Provider Notes (Signed)
Complains of swelling in bilateral legs for the past 2-3 weeks. He has slight pain in right distal posterior calf or possibly one week which is worse with changing position or with walking. Denies any shortness of breath or chest pain On exam patient is in no distress neck no JVD lungs clear auscultation heart regular rate and rhythm lateral lower extremities with 1+ pretibial pitting edema. DP pulses 2+ bilaterally   Orlie Dakin, MD 09/08/16 1648

## 2016-09-08 NOTE — ED Provider Notes (Signed)
[facility] DEPT Provider Note   CSN: [id number] Arrival date & time: 09/08/16  0725     History   Chief Complaint Chief Complaint  Patient presents with  . swelling in leg    HPI Casey Greene is a 65 y.o. male.  65 year old African-American male past medical history significant for prostate cancer, hypertension that presents to the ED with complaint of right calf pain and bilateral leg swelling. Patient has history of bilateral leg swelling. He was seen by his PCP in September for same. At that time increase his Lasix. Patient currently takes losartan, metoprolol, Norvasc for hypertension. He was told that he Norvasc will increase his swelling in his legs. Patient states that he has been on his medications for the past 3 days because he cannot pick him up from the pharmacy due to his credit card being locked. Patient states that he should be able to pick up his medications in the next 3-4 days. However patient complains of right calf pain that is worse with ambulation. Patient states that "water retention in his right foot is nonproductive to his calf area". He has tried nothing for the pain. Walking makes the pain worse. Nothing makes pain better. Patient denies any recent hospitalizations/surgeries, prolonged immobilization, history of DVT, unilateral leg swelling. Patient denies any fever, chills, headache, dizziness, lightheadedness, chest pain, shortness of breath, abdominal pain, urinary symptoms, change in bowel habits, numbness/tingling.      Past Medical History:  Diagnosis Date  . Cancer [facility]) 1997   prostate  . Decreased appetite   . Foley catheter in place   . Hepatitis C   . History of gout   . Hypertension   . Slow heartbeat   . Urinary retention     Patient Active Problem List   Diagnosis Date Noted  . Prostate cancer ([place]) 01/26/2016  . Onychomycosis 12/01/2013  . Pain, foot 12/01/2013  . PVD (peripheral vascular disease) ([place]) 12/01/2013    Past  Surgical History:  Procedure Laterality Date  . ACHILLES TENDON REPAIR    . CERVICAL DISC SURGERY     neck  . LYMPHADENECTOMY Bilateral 01/26/2016   Procedure: EXTENDED BILATERAL PELVIC LYMPHADENECTOMY;  Surgeon: Raynelle Bring, MD;  Location: [facility];  Service: Urology;  Laterality: Bilateral;  . NOSE SURGERY     in high school-nasal fracturex2  . ROBOT ASSISTED LAPAROSCOPIC RADICAL PROSTATECTOMY N/A 01/26/2016   Procedure: XI ROBOTIC ASSISTED LAPAROSCOPIC RADICAL PROSTATECTOMY LEVEL 3;  Surgeon: Raynelle Bring, MD;  Location: [facility];  Service: Urology;  Laterality: N/A;       Home Medications    Prior to Admission medications   Medication Sig Start Date End Date Taking? Authorizing Provider  amLODipine (NORVASC) 10 MG tablet Take 10 mg by mouth daily. 06/22/16 06/22/17  Historical Provider, MD  furosemide (LASIX) 20 MG tablet Take 1 tablet (20 mg total) by mouth 2 (two) times daily. 07/08/16   Davonna Belling, MD  HYDROcodone-acetaminophen (NORCO) 5-325 MG tablet Take 1-2 tablets by mouth every 6 (six) hours as needed. Patient not taking: Reported on 07/08/2016 01/26/16   Debbrah Alar, [name]  losartan (COZAAR) 100 MG tablet Take 100 mg by mouth every morning.    Historical Provider, MD  metoprolol tartrate (LOPRESSOR) 25 MG tablet Take 1 tablet (25 mg total) by mouth 2 (two) times daily. Patient not taking: Reported on 07/08/2016 01/27/16   Debbrah Alar, [name]  potassium chloride SA (K-DUR,KLOR-CON) 20 MEQ tablet Take 1 tablet (20 mEq total) by mouth 2 (two)  times daily. 07/08/16   Davonna Belling, MD  sulfamethoxazole-trimethoprim (BACTRIM DS,SEPTRA DS) 800-160 MG tablet Take 1 tablet by mouth 2 (two) times daily. Start the day prior to foley removal appointment Patient not taking: Reported on 07/08/2016 01/26/16   Debbrah Alar, [name]    Family History Family History  Problem Relation Age of Onset  . Heart disease Mother   . Obesity Mother   . Cervical cancer Mother   . Heart disease Father   .  Diabetes Father   . ALS Sister   . Colon cancer Neg Hx     Social History Social History  Substance Use Topics  . Smoking status: Former Smoker    Quit date: 01/11/2006  . Smokeless tobacco: Never Used  . Alcohol use No     Allergies   Amlodipine   Review of Systems Review of Systems  Constitutional: Negative for chills and fever.  HENT: Negative for congestion, ear pain, rhinorrhea and sore throat.   Eyes: Negative for pain and discharge.  Respiratory: Negative for cough and shortness of breath.   Cardiovascular: Positive for leg swelling (bilateral). Negative for chest pain and palpitations.  Gastrointestinal: Negative for abdominal pain, diarrhea, nausea and vomiting.  Genitourinary: Negative for flank pain, frequency, hematuria and urgency.  Musculoskeletal: Negative for myalgias and neck pain.  Neurological: Negative for dizziness, syncope, weakness, light-headedness, numbness and headaches.  All other systems reviewed and are negative.    Physical Exam Updated Vital Signs BP (!) 154/102 (BP Location: Left Arm) Comment: PT states hasnt taken htn meds today   Pulse 62   Temp 97.5 F (36.4 C) (Oral)   Resp 14   Ht 5\' 10"  (1.778 m)   Wt 93 kg   SpO2 100%   BMI 29.41 kg/m   Physical Exam  Constitutional: He appears well-developed and well-nourished. No distress.  HENT:  Head: Normocephalic and atraumatic.  Mouth/Throat: Oropharynx is clear and moist.  Eyes: Conjunctivae are normal. Pupils are equal, round, and reactive to light. Right eye exhibits no discharge. Left eye exhibits no discharge. No scleral icterus.  Neck: Normal range of motion. Neck supple. No thyromegaly present.  No jvd noted.  Cardiovascular: Normal rate, regular rhythm, normal heart sounds and intact distal pulses.  Exam reveals no gallop and no friction rub.   No murmur heard. Pulmonary/Chest: Effort normal and breath sounds normal. No respiratory distress. He has no wheezes. He has no  rhonchi. He has no rales.  Abdominal: Soft. Bowel sounds are normal. He exhibits no distension. There is no tenderness. There is no rebound and no guarding.  Musculoskeletal: Normal range of motion. He exhibits edema and tenderness (right calf).  1+ equal bilateral edema noted to lower extremities. Patient reports right calf tenderness on palpation. No erythema or ecchymosis noted. No increased swelling to right lower extremity. DP pulses are 2+ bilaterally. Sensation intact. Cap refill normal.  Lymphadenopathy:    He has no cervical adenopathy.  Neurological: He is alert.  Skin: Skin is warm and dry. Capillary refill takes less than 2 seconds.  Nursing note and vitals reviewed.    ED Treatments / Results  Labs (all labs ordered are listed, but only abnormal results are displayed) Labs Reviewed  BASIC METABOLIC PANEL - Abnormal; Notable for the following:       Result Value   Potassium 3.2 (*)    BUN 33 (*)    Creatinine, Ser 1.45 (*)    GFR calc non Af Amer 48 (*)  GFR calc Af Amer 56 (*)    All other components within normal limits  CBC WITH DIFFERENTIAL/PLATELET - Abnormal; Notable for the following:    RBC 4.10 (*)    Hemoglobin 12.6 (*)    HCT 37.6 (*)    Platelets 116 (*)    All other components within normal limits  BRAIN NATRIURETIC PEPTIDE    EKG  EKG Interpretation None       Radiology No results found.  Procedures Procedures (including critical care time)  Medications Ordered in ED Medications  furosemide (LASIX) tablet 20 mg (20 mg Oral Given 09/08/16 0917)  potassium chloride SA (K-DUR,KLOR-CON) CR tablet 20 mEq (20 mEq Oral Given 09/08/16 1044)     Initial Impression / Assessment and Plan / ED Course  I have reviewed the triage vital signs and the nursing notes.  Pertinent labs & imaging results that were available during my care of the patient were reviewed by me and considered in my medical decision making (see chart for details).  Clinical  Course  Patient presented with bilateral lower extremity edema and right calf pain. Patient has history of lower extremity edema. He is currently being followed by PCP for same. Patient has not taken his bp for the past 2 days because he can't get them from the pharmacy due to credit card issues. Patient with right calf pain that is new. Concern for DVT. Lower extremity US preformed that was negative for DVT. Patient was given home dose of lasix and potassium in ED. Labs with chronic hypokalemia. Patient on potassium supplements at home. Creatine is stable. Hgb is stable. Thrombocytopenia noted. Patient informed to follow up with PCP this week for recheck. BNP was unremarkable. Low suspicion for CHF. Patient states that he has a friend that will give him money to borrow to get medicine from pharmacy. Patient has prescriptions at pharmacy for pick up. Patient is without cp or sob. Hemodynamically stable. BP elevated but patient has not takin BP meds. He is without ha or vision changes. Patient is stable for discharge home. Patient seen and evaluated by Dr. Winfred Leeds who is agreeable to the above plan. Encouraged to follow up with pcp. Patient is agreeable to plan. Discharged home in NAD with stable vs. Given strict return precautions.    Final Clinical Impressions(s) / ED Diagnoses   Final diagnoses:  Peripheral edema    New Prescriptions Discharge Medication List as of 09/08/2016 11:24 AM       Doristine Devoid, [name] 09/10/16 0559    Orlie Dakin, MD 09/12/16 [phone]

## 2016-10-16 ENCOUNTER — Encounter: Payer: Self-pay | Admitting: Medical Oncology

## 2016-10-16 ENCOUNTER — Ambulatory Visit: Payer: Medicare HMO | Admitting: Oncology

## 2016-10-16 ENCOUNTER — Encounter: Payer: Self-pay | Admitting: Radiation Oncology

## 2016-10-16 ENCOUNTER — Ambulatory Visit
Admission: RE | Admit: 2016-10-16 | Discharge: 2016-10-16 | Disposition: A | Discharge status: Home / Self Care | Payer: Medicare HMO | Source: Ambulatory Visit | Attending: Radiation Oncology | Admitting: Radiation Oncology

## 2016-10-16 DIAGNOSIS — C61 Malignant neoplasm of prostate: Secondary | ICD-10-CM

## 2016-10-16 HISTORY — DX: Malignant neoplasm of prostate: C61

## 2016-10-16 NOTE — Progress Notes (Unsigned)
Left a message requesting a return call regarding no show for Prostate MDC.

## 2016-10-16 NOTE — Progress Notes (Signed)
GU Location of Tumor / Histology: prostatic adenocarcinoma  If Prostate Cancer, Gleason Score is (3 + 4) and pretreatment PSA was (39.38).   Casey Greene was initially diagnosed with low risk prostate cancer in [place] in 2000. He refused curative treatment at the time. In 2009 he established care in [place], [place] several second opinions but still refused curative therapy.    Past/Anticipated interventions by urology, if any: biopsy, radical prostatectomy, discussion about ADT, referral to [facility]  Past/Anticipated interventions by medical oncology, if any: no  Weight changes, if any: no  Bowel/Bladder complaints, if any: ED, severe incontinence (10-12 ppd)   Nausea/Vomiting, if any: no  Pain issues, if any:  no  SAFETY ISSUES:  Prior radiation? no  Pacemaker/ICD? no  Possible current pregnancy? no  Is the patient on methotrexate? no  Current Complaints / other details:  65 year old male. Married.

## 2016-10-22 DIAGNOSIS — Z5329 Procedure and treatment not carried out because of patient's decision for other reasons: Secondary | ICD-10-CM | POA: Insufficient documentation

## 2016-10-22 DIAGNOSIS — Z91199 Patient's noncompliance with other medical treatment and regimen due to unspecified reason: Secondary | ICD-10-CM | POA: Insufficient documentation

## 2016-10-22 DIAGNOSIS — Z5320 Procedure and treatment not carried out because of patient's decision for unspecified reasons: Secondary | ICD-10-CM | POA: Insufficient documentation

## 2016-10-22 HISTORY — DX: Procedure and treatment not carried out because of patient's decision for unspecified reasons: Z53.20

## 2016-10-22 HISTORY — DX: Procedure and treatment not carried out because of patient's decision for other reasons: Z53.29

## 2018-01-02 ENCOUNTER — Other Ambulatory Visit: Payer: Self-pay | Admitting: Urology

## 2018-01-02 DIAGNOSIS — C61 Malignant neoplasm of prostate: Secondary | ICD-10-CM

## 2018-01-08 ENCOUNTER — Encounter (HOSPITAL_COMMUNITY): Payer: Medicare HMO

## 2018-01-16 ENCOUNTER — Encounter (HOSPITAL_COMMUNITY)
Admission: RE | Admit: 2018-01-16 | Discharge: 2018-01-16 | Disposition: A | Discharge status: Home / Self Care | Payer: Medicare HMO | Source: Ambulatory Visit | Attending: Urology | Admitting: Urology

## 2018-01-16 DIAGNOSIS — C61 Malignant neoplasm of prostate: Secondary | ICD-10-CM | POA: Diagnosis not present

## 2018-01-16 MED ORDER — TECHNETIUM TC 99M MEDRONATE IV KIT
20.0000 | PACK | Freq: Once | INTRAVENOUS | Status: AC | PRN
  Administered 2018-01-16: 20 via INTRAVENOUS

## 2018-02-11 ENCOUNTER — Encounter: Payer: Self-pay | Admitting: Radiation Oncology

## 2018-03-11 ENCOUNTER — Encounter: Payer: Self-pay | Admitting: Radiation Oncology

## 2018-03-11 NOTE — Progress Notes (Signed)
GU Location of Tumor / Histology: metastatic prostatic adenocarcinoma dx in 2000. S/p robotic prostatectomy 01/26/2016. Positive surgical margin at the right posterior margin and 2 of 25 lymph nodes positive.  If Prostate Cancer, Gleason Score is (3 + 4) and PSA is (39.38) pre treatment. PSA as of 12/27/2017 21.70.    Past/Anticipated interventions by urology, if any: Diagnosed by urologist in [place] in 2000 but, refused treatment. Patient established care in 2009 in [place] and underwent a repeat biopsy confirming Gleason 4+4 disease. Patient saw multiple urologist for second opinions. Patient saw Alinda Money in consult in 2014 after he developed urinary retention due to BPH but again refused curative treatment. Prostatectomy performed 01/26/2016. Lupron 45 given 02/10/2018 (next dose due in six months).   Past/Anticipated interventions by medical oncology, if any: no  Weight changes, if any: lost 10 lb over the past month (sister in law has been cooking)  Bowel/Bladder complaints, if any: Reports stress incontinence s/p prostatectomy requiring 10 ppd. Reports he leaks more at night. Reports nocturia x 7. Reports urinary frequency.   Nausea/Vomiting, if any: no  Pain issues, if any:  no  SAFETY ISSUES:  Prior radiation? no  Pacemaker/ICD? no  Possible current pregnancy? no  Is the patient on methotrexate? no  Current Complaints / other details:  67 year old male. Married. Hep C. Denies hot flashes. Denies dysuria or hematuria. Resides in [place], [place]. Has grown children. Mother deceased with hx of cervical cancer.

## 2018-03-12 ENCOUNTER — Ambulatory Visit
Admission: RE | Admit: 2018-03-12 | Discharge: 2018-03-12 | Disposition: A | Discharge status: Home / Self Care | Payer: Medicare HMO | Source: Ambulatory Visit | Attending: Radiation Oncology | Admitting: Radiation Oncology

## 2018-03-12 ENCOUNTER — Encounter: Payer: Self-pay | Admitting: Radiation Oncology

## 2018-03-12 ENCOUNTER — Other Ambulatory Visit: Payer: Self-pay

## 2018-03-12 VITALS — BP 165/122 | HR 80 | Temp 98.0°F | Resp 18 | Wt 190.4 lb

## 2018-03-12 DIAGNOSIS — I10 Essential (primary) hypertension: Secondary | ICD-10-CM | POA: Insufficient documentation

## 2018-03-12 DIAGNOSIS — Z79899 Other long term (current) drug therapy: Secondary | ICD-10-CM | POA: Insufficient documentation

## 2018-03-12 DIAGNOSIS — C61 Malignant neoplasm of prostate: Secondary | ICD-10-CM

## 2018-03-12 DIAGNOSIS — Z87891 Personal history of nicotine dependence: Secondary | ICD-10-CM | POA: Insufficient documentation

## 2018-03-12 NOTE — Progress Notes (Signed)
See progress note under physician encounter. 

## 2018-03-12 NOTE — Progress Notes (Signed)
Radiation Oncology         [phone] ________________________________  Initial outpatient Consultation  Name: Casey Greene MRN: [id number]  Date: 03/12/2018  DOB: 1951-03-27  VE:[facility], No Pcp Per  Festus Aloe, MD   REFERRING PHYSICIAN: Festus Aloe, MD  DIAGNOSIS: The encounter diagnosis was Prostate cancer [facility]).    ICD-10-CM   1. Prostate cancer [facility]) [facility] referral to Social Work    HISTORY OF PRESENT ILLNESS: [name] is a 67 y.o. male with metastatic prostatic adenocarcinoma diagnosed initially in 2000 with Gleason score 3+4 and PSA 39.38 pre-treatment, by a urologist in [place]. He initially refused treatment. He then established care in [place] in 2009 and underwent a repeat biopsy confirming Gleason 4+4 disease. He saw multiple urologists for second opinions but deferred treatment. He later saw Dr. Alinda Money for consultation in 2014 after he developed urinary retention secondary to BPH but again refused curative treatment.  He eventually underwent robotic prostatectomy with Dr. Alinda Money on 01/26/2016 with final pathology revealing pT3aN1Mx, Gleason 3+4 adenocarcinoma of the prostate with positive surgical margin at the right posterior margin and 2 of 25 lymph nodes positive. PSA nadir was 1.82 but quickly began rising thereafter.  PSA was 3.66 05/2016 and the patient was lost to follow up thereafter until he reestablished care with Dr. Junious Silk in 12/2017.  PSA as of 12/27/2017 is up to 21.70.  A CT abdomen/pelvis was performed on 01/09/2018 for disease restaging and showed no evidence for recurrent or metastatic disease.  He had a bone scan on 01/16/2018 which also showed no acute or focal bony abnormality and no evidence of metastatic disease.  He was started on ADT with Lupron 45mg  on 02/10/2018 (next dose due in six months).  The patient is accompanied by his wife and has kindly been referred today for discussion of potential salvage radiation treatment  options.  PREVIOUS RADIATION THERAPY: No  PAST MEDICAL HISTORY:  Past Medical History:  Diagnosis Date  . Decreased appetite   . Foley catheter in place   . Hepatitis C   . History of gout   . Hypertension   . Prostate cancer ([place])   . Slow heartbeat   . Urinary retention       PAST SURGICAL HISTORY: Past Surgical History:  Procedure Laterality Date  . ACHILLES TENDON REPAIR    . CERVICAL DISC SURGERY     neck  . LYMPHADENECTOMY Bilateral 01/26/2016   Procedure: EXTENDED BILATERAL PELVIC LYMPHADENECTOMY;  Surgeon: Raynelle Bring, MD;  Location: [facility];  Service: Urology;  Laterality: Bilateral;  . NOSE SURGERY     in high school-nasal fracturex2  . ROBOT ASSISTED LAPAROSCOPIC RADICAL PROSTATECTOMY N/A 01/26/2016   Procedure: XI ROBOTIC ASSISTED LAPAROSCOPIC RADICAL PROSTATECTOMY LEVEL 3;  Surgeon: Raynelle Bring, MD;  Location[facility];  Service: Urology;  Laterality: N/A;    FAMILY HISTORY:  Family History  Problem Relation Age of Onset  . Heart disease Mother   . Obesity Mother   . Cervical cancer Mother   . Heart disease Father   . Diabetes Father   . ALS Sister   . Colon cancer Neg Hx     SOCIAL HISTORY:  Social History   Socioeconomic History  . Marital status: Married    Spouse name: Not on file  . Number of children: Not on file  . Years of education: Not on file  . Highest education level: Not on file  Occupational History  . Not on file  Social Needs  .  Financial resource strain: Not on file  . Food insecurity:    Worry: Not on file    Inability: Not on file  . Transportation needs:    Medical: Not on file    Non-medical: Not on file  Tobacco Use  . Smoking status: Former Smoker    Packs/day: 0.50    Years: 10.00    Pack years: 5.00    Types: Cigarettes    Last attempt to quit: 01/11/2006    Years since quitting: 12.1  . Smokeless tobacco: Never Used  Substance and Sexual Activity  . Alcohol use: No  . Drug use: No  . Sexual activity: Not  Currently  Lifestyle  . Physical activity:    Days per week: Not on file    Minutes per session: Not on file  . Stress: Not on file  Relationships  . Social connections:    Talks on phone: Not on file    Gets together: Not on file    Attends religious service: Not on file    Active member of club or organization: Not on file    Attends meetings of clubs or organizations: Not on file    Relationship status: Not on file  . Intimate partner violence:    Fear of current or ex partner: Not on file    Emotionally abused: Not on file    Physically abused: Not on file    Forced sexual activity: Not on file  Other Topics Concern  . Not on file  Social History Narrative  . Not on file    ALLERGIES: Amlodipine and Tamsulosin hcl  MEDICATIONS:  Current Outpatient Medications  Medication Sig Dispense Refill  . Calcium Carbonate-Vit D-Min (CALTRATE 600+D PLUS PO) Take by mouth.    . trospium (SANCTURA) 20 MG tablet Take 20 mg by mouth 2 (two) times daily.    Marland Kitchen amLODipine (NORVASC) 10 MG tablet Take 10 mg by mouth daily.    . furosemide (LASIX) 20 MG tablet Take 1 tablet (20 mg total) by mouth 2 (two) times daily. (Patient not taking: Reported on 03/12/2018) 3 tablet 0  . HYDROcodone-acetaminophen (NORCO) 5-325 MG tablet Take 1-2 tablets by mouth every 6 (six) hours as needed. (Patient not taking: Reported on 07/08/2016) 30 tablet 0  . losartan (COZAAR) 100 MG tablet Take 100 mg by mouth every morning.    . metoprolol tartrate (LOPRESSOR) 25 MG tablet Take 1 tablet (25 mg total) by mouth 2 (two) times daily. (Patient not taking: Reported on 07/08/2016) 30 tablet 0  . potassium chloride SA (K-DUR,KLOR-CON) 20 MEQ tablet Take 1 tablet (20 mEq total) by mouth 2 (two) times daily. (Patient not taking: Reported on 03/12/2018) 6 tablet 0   No current facility-administered medications for this encounter.     REVIEW OF SYSTEMS:  On review of systems, the patient reports that he is doing well overall.  He denies any chest pain, shortness of breath, cough, fevers, chills, night sweats. He reports weight loss of 10 lb over the last month, attributing it to his sister-in-law's cooking. He denies any bowel disturbances, and denies abdominal pain, nausea or vomiting. He reports stress incontinence s/p prostatectomy requiring 10 ppd and reports that he leaks more at night. He also reports nocturia x7. He has excessive daytime urinary frequency and urgency which is quite bothersome. He denies hot flashes since starting ADT. He reports trouble sleeping secondary to nocturia but denies any new musculoskeletal or joint aches or pains. His IPSS indicates severe  urinary symptoms.   He denies dysuria or hematuria. He is not to complete sexual activity with most attempts. A complete review of systems is obtained and is otherwise negative.  PHYSICAL EXAM:  Wt Readings from Last 3 Encounters:  03/12/18 190 lb 6.4 oz (86.4 kg)  09/08/16 205 lb (93 kg)  01/26/16 200 lb 13.4 oz (91.1 kg)   Temp Readings from Last 3 Encounters:  03/12/18 98 F (36.7 C) (Oral)  09/08/16 97.5 F (36.4 C) (Oral)  09/07/16 97.6 F (36.4 C) (Oral)   BP Readings from Last 3 Encounters:  03/12/18 (!) 165/122  09/08/16 (!) 154/102  09/07/16 158/93   Pulse Readings from Last 3 Encounters:  03/12/18 80  09/08/16 62  09/07/16 74    In general this is a well appearing african-american male in no acute distress. He is alert and oriented x4 and appropriate throughout the examination. HEENT reveals that the patient is normocephalic, atraumatic. EOMs are intact. PERRLA. Skin is intact without any evidence of gross lesions. Cardiovascular exam reveals a regular rate and rhythm, no clicks rubs or murmurs are auscultated. Chest is clear to auscultation bilaterally. Lymphatic assessment is performed and does not reveal any adenopathy in the cervical, supraclavicular, axillary, or inguinal chains. Abdomen has active bowel sounds in all  quadrants and is intact. The abdomen is soft, non tender, non distended. Lower extremities are negative for pretibial pitting edema, deep calf tenderness, cyanosis or clubbing.   KPS = 90  100 - Normal; no complaints; no evidence of disease. 90   - Able to carry on normal activity; minor signs or symptoms of disease. 80   - Normal activity with effort; some signs or symptoms of disease. 58   - Cares for self; unable to carry on normal activity or to do active work. 60   - Requires occasional assistance, but is able to care for most of his personal needs. 50   - Requires considerable assistance and frequent medical care. 16   - Disabled; requires special care and assistance. 21   - Severely disabled; hospital admission is indicated although death not imminent. 74   - Very sick; hospital admission necessary; active supportive treatment necessary. 10   - Moribund; fatal processes progressing rapidly. 0     - Dead  Karnofsky DA, Abelmann [place], Craver LS and Burchenal [facility] [phone]) The use of the nitrogen mustards in the palliative treatment of carcinoma: with particular reference to bronchogenic carcinoma Cancer 1 634-56  LABORATORY DATA:  Lab Results  Component Value Date   WBC 5.6 09/08/2016   HGB 12.6 (L) 09/08/2016   HCT 37.6 (L) 09/08/2016   MCV 91.7 09/08/2016   PLT 116 (L) 09/08/2016   Lab Results  Component Value Date   NA 138 09/08/2016   K 3.2 (L) 09/08/2016   CL 103 09/08/2016   CO2 27 09/08/2016   Lab Results  Component Value Date   ALT 47 07/08/2016   AST 61 (H) 07/08/2016   ALKPHOS 50 07/08/2016   BILITOT 0.8 07/08/2016     RADIOGRAPHY: No results found.    IMPRESSION/PLAN: 1. 67 y.o. gentleman with detectable, rising PSA of 21.70 s/p RALP 01/26/16 for pT3aN1Mx, Gleason 3+4 adenocarcinoma of the prostate. Today we reviewed the findings and workup thus far.  We discussed the natural history of prostate cancer.  We reviewed the the implications of positive margins,  extracapsular extension, and seminal vesicle involvement on the risk of prostate cancer recurrence as well as the implications  of detectable, rising post-op PSA.  We discussed radiation treatment directed to the prostatic fossa with regard to the logistics and delivery of external beam radiation treatment.  We also detailed the role of ADT in the treatment of recurrent prostate cancer and outlined the associated side effects that could be expected with this therapy. The recommendation is to proceed with a 7.5 week course of daily radiotherapy to the prostate fossa in combination with ADT.  We discussed the potential associated short and long term side effects associated with radiation treatments.  At the conclusion of our conversation, the patient elects to proceed with 7.5 weeks of EBRT to the prostatic fossa in combination with ADT.  He started Lupron on 02/10/18.  We will share our findings with Dr. Junious Silk. He has freely signed written consent to proceed today in the office and a copy of this document is placed in the patient's chart.  He is scheduled for CT simulation for treatment planning on Friday, 03/21/18 at 11am in anticipation of beginning treatment in the near future.  We enjoyed meeting him and his wife today and look forward to participating in the care of this nice gentleman.  We spent 60 minutes minutes face to face with the patient and more than 50% of that time was spent in counseling and/or coordination of care.    Nicholos Johns, [name]    Tyler Pita, MD  [facility] Direct Dial: [phone]  Fax: [phone] [email or web address]  Skype  [email or web address]  This document serves as a record of services personally performed by Tyler Pita, MD and Freeman Caldron, [name]. It was created on their behalf by Arlyce Harman, a trained medical scribe. The creation of this record is based on the scribe's personal observations and the provider's statements to them.  This document has been checked and approved by the attending provider.

## 2018-03-13 ENCOUNTER — Encounter: Payer: Self-pay | Admitting: General Practice

## 2018-03-13 NOTE — Progress Notes (Signed)
[facility] CSW Progress Note  Call to patient to follow up on Distress Screen, VM not personally identified.  CSW left generic message requesting call back.  Edwyna Shell, LCSW Clinical Social Worker Phone:  [phone]

## 2018-03-14 ENCOUNTER — Encounter: Payer: Self-pay | Admitting: General Practice

## 2018-03-14 ENCOUNTER — Telehealth: Payer: Self-pay | Admitting: Radiation Oncology

## 2018-03-14 ENCOUNTER — Ambulatory Visit: Payer: Medicare HMO | Admitting: Radiation Oncology

## 2018-03-14 NOTE — Telephone Encounter (Signed)
Transferred to radiation for reschedule

## 2018-03-14 NOTE — Progress Notes (Signed)
[place] Psychosocial Distress Screening Clinical Social Work  Clinical Social Work was referred by distress screening protocol.  The patient scored a 6 on the Psychosocial Distress Thermometer which indicates moderate distress. Clinical Social Worker Edwyna Shell to assess for distress and other psychosocial needs. Says "Im doing OK except for my wife who had knee surgery and forgot to get her pain medications refilled."  Has "lost my rest" last night.  Works at night, concerned that he may not be "able to make my appointment today because I havent gotten any sleep at all."  Encouraged patient to reach out to scheduling and/or nurse navigator to reschedule if needed.  Will mail patient packet of information on [facility] services.    [facility] DISTRESS SCREENING 03/12/2018  Screening Type Initial Screening  Distress experienced in past week (1-10) 6  Emotional problem type Adjusting to illness  Information Concerns Type Lack of info about treatment  Physician notified of physical symptoms Yes  Referral to clinical psychology No  Referral to clinical social work No  Referral to dietition No  Referral to financial advocate No  Referral to support programs No  Referral to palliative care No    Clinical Social Worker follow up needed: No.  If yes, follow up plan:  Edwyna Shell, LCSW Clinical Social Worker Phone:  [phone]

## 2018-03-17 ENCOUNTER — Telehealth: Payer: Self-pay | Admitting: Medical Oncology

## 2018-03-17 NOTE — Telephone Encounter (Signed)
Casey Greene called to confirm his time for CT simulation 03/21/18.

## 2018-03-18 ENCOUNTER — Ambulatory Visit: Payer: Medicare HMO | Admitting: Family Medicine

## 2018-03-21 ENCOUNTER — Ambulatory Visit
Admission: RE | Admit: 2018-03-21 | Discharge: 2018-03-21 | Disposition: A | Discharge status: Home / Self Care | Payer: Medicare HMO | Source: Ambulatory Visit | Attending: Radiation Oncology | Admitting: Radiation Oncology

## 2018-03-21 DIAGNOSIS — Z510 Encounter for antineoplastic radiation therapy: Secondary | ICD-10-CM | POA: Diagnosis present

## 2018-03-21 DIAGNOSIS — C61 Malignant neoplasm of prostate: Secondary | ICD-10-CM | POA: Insufficient documentation

## 2018-03-21 NOTE — Progress Notes (Signed)
  Radiation Oncology         (336) [phone] ________________________________  Name: Casey Greene MRN: [id number]  Date: 03/21/2018  DOB: [birth year]  SIMULATION AND TREATMENT PLANNING NOTE    ICD-10-CM   1. Prostate cancer [facility]) C61     DIAGNOSIS:  67 y.o. gentleman with detectable, rising PSA of 21.70 s/p RALP 01/26/16 for pT3aN1Mx, Gleason 3+4 adenocarcinoma of the prostate.  NARRATIVE:  The patient was brought to the [place].  Identity was confirmed.  All relevant records and images related to the planned course of therapy were reviewed.  The patient freely provided informed written consent to proceed with treatment after reviewing the details related to the planned course of therapy. The consent form was witnessed and verified by the simulation staff.  Then, the patient was set-up in a stable reproducible supine position for radiation therapy.  A vacuum lock pillow device was custom fabricated to position his legs in a reproducible immobilized position.  Then, I performed a urethrogram under sterile conditions to identify the prostatic bed.  CT images were obtained.  Surface markings were placed.  The CT images were loaded into the planning software.  Then the prostate bed target, pelvic lymph node target and avoidance structures including the rectum, bladder, bowel and hips were contoured.  Treatment planning then occurred.  The radiation prescription was entered and confirmed.  A total of one complex treatment devices were fabricated. I have requested : Intensity Modulated Radiotherapy (IMRT) is medically necessary for this case for the following reason:  Rectal sparing.Marland Kitchen  PLAN:  The patient will receive 45 Gy in 25 fractions of 1.8 Gy, followed by a boost to the prostate bed to a total dose of 68.4 Gy with 13 additional fractions of 1.8 Gy.   ________________________________  Sheral Apley Tammi Klippel, M.D.  This document serves as a record of services personally performed by  Tyler Pita, MD. It was created on his behalf by Arlyce Harman, a trained medical scribe. The creation of this record is based on the scribe's personal observations and the provider's statements to them. This document has been checked and approved by the attending provider.

## 2018-03-28 ENCOUNTER — Ambulatory Visit (INDEPENDENT_AMBULATORY_CARE_PROVIDER_SITE_OTHER): Payer: Medicare HMO | Admitting: Family Medicine

## 2018-03-28 ENCOUNTER — Other Ambulatory Visit: Payer: Self-pay | Admitting: Family Medicine

## 2018-03-28 ENCOUNTER — Ambulatory Visit: Payer: Medicare HMO | Admitting: Family Medicine

## 2018-03-28 ENCOUNTER — Ambulatory Visit
Admission: RE | Admit: 2018-03-28 | Discharge: 2018-03-28 | Disposition: A | Discharge status: Home / Self Care | Payer: Medicare HMO | Source: Ambulatory Visit | Attending: Radiation Oncology | Admitting: Radiation Oncology

## 2018-03-28 ENCOUNTER — Encounter: Payer: Self-pay | Admitting: Family Medicine

## 2018-03-28 VITALS — BP 134/82 | HR 69 | Temp 97.5°F | Ht 70.0 in | Wt 191.8 lb

## 2018-03-28 DIAGNOSIS — D631 Anemia in chronic kidney disease: Secondary | ICD-10-CM | POA: Insufficient documentation

## 2018-03-28 DIAGNOSIS — N183 Chronic kidney disease, stage 3 unspecified: Secondary | ICD-10-CM

## 2018-03-28 DIAGNOSIS — C61 Malignant neoplasm of prostate: Secondary | ICD-10-CM | POA: Insufficient documentation

## 2018-03-28 DIAGNOSIS — I739 Peripheral vascular disease, unspecified: Secondary | ICD-10-CM

## 2018-03-28 DIAGNOSIS — E782 Mixed hyperlipidemia: Secondary | ICD-10-CM | POA: Diagnosis not present

## 2018-03-28 DIAGNOSIS — D638 Anemia in other chronic diseases classified elsewhere: Secondary | ICD-10-CM

## 2018-03-28 DIAGNOSIS — D649 Anemia, unspecified: Secondary | ICD-10-CM

## 2018-03-28 DIAGNOSIS — I10 Essential (primary) hypertension: Secondary | ICD-10-CM

## 2018-03-28 DIAGNOSIS — R221 Localized swelling, mass and lump, neck: Secondary | ICD-10-CM | POA: Diagnosis not present

## 2018-03-28 DIAGNOSIS — B182 Chronic viral hepatitis C: Secondary | ICD-10-CM | POA: Diagnosis not present

## 2018-03-28 DIAGNOSIS — Z510 Encounter for antineoplastic radiation therapy: Secondary | ICD-10-CM | POA: Insufficient documentation

## 2018-03-28 DIAGNOSIS — Z91199 Patient's noncompliance with other medical treatment and regimen due to unspecified reason: Secondary | ICD-10-CM

## 2018-03-28 DIAGNOSIS — Z5329 Procedure and treatment not carried out because of patient's decision for other reasons: Secondary | ICD-10-CM

## 2018-03-28 HISTORY — DX: Anemia in other chronic diseases classified elsewhere: D63.8

## 2018-03-28 HISTORY — DX: Localized swelling, mass and lump, neck: R22.1

## 2018-03-28 LAB — COMPREHENSIVE METABOLIC PANEL
ALT: 68 U/L — AB (ref 0–53)
AST: 87 U/L — AB (ref 0–37)
Albumin: 4 g/dL (ref 3.5–5.2)
Alkaline Phosphatase: 54 U/L (ref 39–117)
BILIRUBIN TOTAL: 0.7 mg/dL (ref 0.2–1.2)
BUN: 50 mg/dL — ABNORMAL HIGH (ref 6–23)
CO2: 33 meq/L — AB (ref 19–32)
CREATININE: 2.01 mg/dL — AB (ref 0.40–1.50)
Calcium: 10 mg/dL (ref 8.4–10.5)
Chloride: 98 mEq/L (ref 96–112)
GFR: 42.54 mL/min — ABNORMAL LOW (ref >60.00)
GLUCOSE: 91 mg/dL (ref 70–99)
Potassium: 3.6 mEq/L (ref 3.5–5.1)
SODIUM: 139 meq/L (ref 135–145)
Total Protein: 8.1 g/dL (ref 6.0–8.3)

## 2018-03-28 LAB — MICROALBUMIN / CREATININE URINE RATIO
Creatinine,U: 103.9 mg/dL
Microalb Creat Ratio: 13.3 mg/g (ref 0.0–30.0)
Microalb, Ur: 13.8 mg/dL — ABNORMAL HIGH (ref 0.0–1.9)

## 2018-03-28 LAB — LIPID PANEL
Cholesterol: 190 mg/dL (ref 0–200)
HDL: 61.9 mg/dL (ref >39.00)
LDL CALC: 116 mg/dL — AB (ref 0–99)
NONHDL: 128.34
Total CHOL/HDL Ratio: 3
Triglycerides: 61 mg/dL (ref 0.0–149.0)
VLDL: 12.2 mg/dL (ref 0.0–40.0)

## 2018-03-28 LAB — CBC
HEMATOCRIT: 39.4 % (ref 39.0–52.0)
HEMOGLOBIN: 13.6 g/dL (ref 13.0–17.0)
MCHC: 34.6 g/dL (ref 30.0–36.0)
MCV: 91.7 fl (ref 78.0–100.0)
PLATELETS: 158 10*3/uL (ref 150.0–400.0)
RBC: 4.3 Mil/uL (ref 4.22–5.81)
RDW: 12.6 % (ref 11.5–15.5)
WBC: 6.1 10*3/uL (ref 4.0–10.5)

## 2018-03-28 LAB — URINALYSIS, ROUTINE W REFLEX MICROSCOPIC
BILIRUBIN URINE: NEGATIVE
KETONES UR: NEGATIVE
Leukocytes, UA: NEGATIVE
Nitrite: NEGATIVE
PH: 6 (ref 5.0–8.0)
SPECIFIC GRAVITY, URINE: 1.015 (ref 1.000–1.030)
UROBILINOGEN UA: 0.2 (ref 0.0–1.0)
Urine Glucose: NEGATIVE

## 2018-03-28 LAB — IRON,TIBC AND FERRITIN PANEL
%SAT: 34 % (calc) (ref 15–60)
Ferritin: 256 ng/mL (ref 20–380)
Iron: 114 ug/dL (ref 50–180)
TIBC: 333 ug/dL (ref 250–425)

## 2018-03-28 MED ORDER — LOSARTAN POTASSIUM 25 MG PO TABS: 25.0000 mg | ORAL_TABLET | Freq: Every day | ORAL | 0 refills | Status: DC

## 2018-03-28 NOTE — Patient Instructions (Signed)
Chronic Kidney Disease, Adult Chronic kidney disease (CKD) happens when the kidneys are damaged during a time of 3 or more months. The kidneys are two organs that do many important jobs in the body. These jobs include:  Removing wastes and extra fluids from the blood.  Making hormones that maintain the amount of fluid in your tissues and blood vessels.  Making sure that the body has the right amount of fluids and chemicals.  Most of the time, this condition does not go away, but it can usually be controlled. Steps must be taken to slow down the kidney damage or stop it from getting worse. Otherwise, the kidneys may stop working. Follow these instructions at home:  Follow your diet as told by your doctor. You may need to avoid alcohol, salty foods (sodium), and foods that are high in potassium, calcium, and protein.  Take over-the-counter and prescription medicines only as told by your doctor. Do not take any new medicines unless your doctor says you can do that. These include vitamins and minerals. ? Medicines and nutritional supplements can make kidney damage worse. ? Your doctor may need to change how much medicine you take.  Do not use any tobacco products. These include cigarettes, chewing tobacco, and e-cigarettes. If you need help quitting, ask your doctor.  Keep all follow-up visits as told by your doctor. This is important.  Check your blood pressure. Tell your doctor if there are changes to your blood pressure.  Get to a healthy weight. Stay at that weight. If you need help with this, ask your doctor.  Start or continue an exercise plan. Try to exercise at least 30 minutes a day, 5 days a week.  Stay up-to-date with your shots (immunizations) as told by your doctor. Contact a doctor if:  Your symptoms get worse.  You have new symptoms. Get help right away if:  You have symptoms of end-stage kidney disease. These include: ? Headaches. ? Skin that is darker or lighter  than normal. ? Numbness in your hands or feet. ? Easy bruising. ? Having hiccups often. ? Chest pain. ? Shortness of breath. ? Stopping of menstrual periods in women.  You have a fever.  You are making very little pee (urine).  You have pain or bleeding when you pee (urinate). This information is not intended to replace advice given to you by your health care provider. Make sure you discuss any questions you have with your health care provider. Document Released: 02/06/2010 Document Revised: 04/19/2016 Document Reviewed: 07/11/2012 Elsevier Interactive Patient Education  2017 Elsevier Inc.  

## 2018-03-28 NOTE — Progress Notes (Addendum)
Subjective:  Patient ID: Casey Greene, male    DOB: 1951-02-17  Age: 67 y.o. MRN: [id number]  CC: Establish Care   HPI Jearl Soto presents for establishment of care and follow-up of multiple complex medical issues.  He has a past medical history of uncontrolled hypertension in the face of chronic kidney disease.  He tells me he has had no blood pressure medicines over the last 2 years.  He says that he is taking potassium in the face of unopposed Lasix and history of chronic kidney disease.  He has a history of prostate cancer treated with a prostatectomy.  His PSA has risen to 20 status post surgery.  He is scheduled for radiation therapy to begin in a week or 2.  Patient denies bony pain.  He deals with chronic urinary incontinence.  He has a history of chronic hepatitis C that has not been treated.  He has a history of anemia from chart review that presumptively is due to chronic kidney disease.  He has a history of hyperlipidemia and peripheral vascular disease.  He tells me that he has never had a stroke or heart attack.  He lives with his wife and son.  He continues to work as a Presenter, broadcasting.  His wife recently had a total knee replacement.  Patient does not smoke drink or use illicit drugs.  He has no history of thyroid issues.  He tells of a 10 pound weight loss in the last few weeks.  History Clell has a past medical history of Decreased appetite, Foley catheter in place, Hepatitis C, History of gout, Hypertension, Prostate cancer ([place]), Slow heartbeat, and Urinary retention.   He has a past surgical history that includes Cervical disc surgery; Nose surgery; Achilles tendon repair; Robot assisted laparoscopic radical prostatectomy (N/A, 01/26/2016); and Lymphadenectomy (Bilateral, 01/26/2016).   His family history includes ALS in his sister; Cervical cancer in his mother; Diabetes in his father; Heart disease in his father and mother; Obesity in his mother.He reports that he quit smoking  about 12 years ago. His smoking use included cigarettes. He has a 5.00 pack-year smoking history. He has never used smokeless tobacco. He reports that he does not drink alcohol or use drugs.  Outpatient Medications Prior to Visit  Medication Sig Dispense Refill  . trospium (SANCTURA) 20 MG tablet Take 20 mg by mouth 2 (two) times daily.    . potassium chloride SA (K-DUR,KLOR-CON) 20 MEQ tablet Take 1 tablet (20 mEq total) by mouth 2 (two) times daily. 6 tablet 0  . Calcium Carbonate-Vit D-Min (CALTRATE 600+D PLUS PO) Take by mouth.    . furosemide (LASIX) 20 MG tablet Take 1 tablet (20 mg total) by mouth 2 (two) times daily. (Patient not taking: Reported on 03/12/2018) 3 tablet 0  . HYDROcodone-acetaminophen (NORCO) 5-325 MG tablet Take 1-2 tablets by mouth every 6 (six) hours as needed. (Patient not taking: Reported on 07/08/2016) 30 tablet 0  . metoprolol tartrate (LOPRESSOR) 25 MG tablet Take 1 tablet (25 mg total) by mouth 2 (two) times daily. (Patient not taking: Reported on 07/08/2016) 30 tablet 0  . amLODipine (NORVASC) 10 MG tablet Take 10 mg by mouth daily.    Marland Kitchen losartan (COZAAR) 100 MG tablet Take 100 mg by mouth every morning.     No facility-administered medications prior to visit.     ROS Review of Systems  Constitutional: Positive for fatigue and unexpected weight change. Negative for chills and fever.  HENT: Negative.  Eyes: Negative.   Respiratory: Negative.   Cardiovascular: Negative.   Gastrointestinal: Negative.   Endocrine: Negative for cold intolerance and heat intolerance.  Genitourinary: Negative for difficulty urinating, hematuria and urgency.  Musculoskeletal: Negative for gait problem and joint swelling.  Allergic/Immunologic: Positive for immunocompromised state.  Neurological: Negative for weakness and light-headedness.  Hematological: Does not bruise/bleed easily.  Psychiatric/Behavioral: Negative.     Objective:  BP 134/82 (BP Location: Left Arm, Patient  Position: Sitting, Cuff Size: Normal)   Pulse 69   Temp (!) 97.5 F (36.4 C) (Oral)   Ht 5\' 10"  (1.778 m)   Wt 191 lb 12.8 oz (87 kg)   SpO2 96%   BMI 27.52 kg/m   Physical Exam  Constitutional: He is oriented to person, place, and time. He appears well-developed and well-nourished. No distress.  HENT:  Head: Normocephalic and atraumatic.  Right Ear: External ear normal.  Left Ear: External ear normal.  Mouth/Throat: Oropharynx is clear and moist. No oropharyngeal exudate.  Eyes: Pupils are equal, round, and reactive to light. Conjunctivae and EOM are normal. Right eye exhibits no discharge. Left eye exhibits no discharge. No scleral icterus.  Neck: Normal range of motion. Neck supple. No JVD present. No tracheal deviation present. Thyromegaly present.  Cardiovascular: Normal rate, regular rhythm and normal heart sounds.  Pulmonary/Chest: Effort normal and breath sounds normal.  Abdominal: Soft. Bowel sounds are normal.  Musculoskeletal: He exhibits edema.  1 plus edema in lower extremities.   Lymphadenopathy:    He has no cervical adenopathy.  Neurological: He is alert and oriented to person, place, and time.  Skin: Skin is warm and dry. He is not diaphoretic.  Psychiatric: He has a normal mood and affect. His behavior is normal.  Pt is rather quiet and subdued.       Assessment & Plan:   Steadman was seen today for establish care.  Diagnoses and all orders for this visit:  Essential hypertension -     Comprehensive metabolic panel -     Urinalysis, Routine w reflex microscopic -     Microalbumin / creatinine urine ratio -     CBC -     Discontinue: losartan (COZAAR) 25 MG tablet; Take 1 tablet (25 mg total) by mouth daily.  PVD (peripheral vascular disease) ([place]) -     Lipid panel -     Discontinue: losartan (COZAAR) 25 MG tablet; Take 1 tablet (25 mg total) by mouth daily.  Prostate cancer (HCC)  CKD (chronic kidney disease) stage 3, GFR 30-59 ml/min (HCC) -      Comprehensive metabolic panel -     Lipid panel -     Discontinue: losartan (COZAAR) 25 MG tablet; Take 1 tablet (25 mg total) by mouth daily.  Mixed hyperlipidemia -     Lipid panel  Noncompliance by refusing intervention or support  Mass of thyroid region -     TSH -     Cancel: US Soft Tissue Head/Neck; Future -     Cancel: US Thyroid Biopsy; Future -     Cancel: US Soft Tissue Head/Neck; Future -     US THYROID; Future  Chronic hepatitis C without hepatic coma (HCC) -     Comprehensive metabolic panel  Anemia, unspecified type -     Iron, TIBC and Ferritin Panel -     TSH -     B12 and Folate Panel -     CBC   I have discontinued [name] Spallone's  losartan, amLODipine, and potassium chloride SA. I am also having him maintain his HYDROcodone-acetaminophen, metoprolol tartrate, furosemide, trospium, and Calcium Carbonate-Vit D-Min (CALTRATE 600+D PLUS PO).  Meds ordered this encounter  Medications  . DISCONTD: losartan (COZAAR) 25 MG tablet    Sig: Take 1 tablet (25 mg total) by mouth daily.    Dispense:  30 tablet    Refill:  0   Chart review shows GFR from a BMP drawn in February to be 48.  We will go ahead and restart losartan at a low dose.  Recheck his BMP in 2 weeks.  Large thyroid mass on exam today that may not be a pressing issue with regards to his upcoming eminent radiation therapy for prostate cancer status post prostatectomy.  Offered pneumonia vaccine and patient says that he does not accept vaccines.  Have discontinued potassium for now.  Follow-up: Return in about 2 weeks (around 04/11/2018).  Libby Maw, MD

## 2018-03-29 LAB — B12 AND FOLATE PANEL
FOLATE: 23.2 ng/mL (ref >5.9)
Vitamin B-12: 596 pg/mL (ref 211–911)

## 2018-03-29 LAB — TSH: TSH: 2.72 u[IU]/mL (ref 0.35–4.50)

## 2018-03-29 NOTE — Progress Notes (Signed)
  Radiation Oncology         [phone]) [phone] ________________________________  Name: Casey Greene MRN: [id number]  Date: 03/28/2018  DOB: 1951/10/21  SIMULATION AND TREATMENT PLANNING NOTE    ICD-10-CM   1. Prostate cancer [facility]) C61      NARRATIVE:  The patient was brought to the [place].  His previous CT sim showed insufficient bladder filling to proceed with planning.  He returns today for repeat imaging with full bladder   ________________________________  Sheral Apley. Tammi Klippel, M.D.

## 2018-03-31 NOTE — Addendum Note (Signed)
Addended by: Jon Billings on: 03/31/2018 12:42 PM   Modules accepted: Orders

## 2018-03-31 NOTE — Addendum Note (Signed)
Addended by: Jon Billings on: 03/31/2018 12:23 PM   Modules accepted: Orders

## 2018-03-31 NOTE — Addendum Note (Signed)
Addended by: Abelino Derrick A on: 03/31/2018 11:59 AM   Modules accepted: Orders

## 2018-04-01 ENCOUNTER — Ambulatory Visit: Payer: Medicare HMO

## 2018-04-02 ENCOUNTER — Ambulatory Visit: Payer: Medicare HMO

## 2018-04-03 ENCOUNTER — Ambulatory Visit: Payer: Medicare HMO

## 2018-04-04 ENCOUNTER — Telehealth: Payer: Self-pay | Admitting: Family Medicine

## 2018-04-04 ENCOUNTER — Ambulatory Visit: Payer: Medicare HMO

## 2018-04-04 DIAGNOSIS — Z510 Encounter for antineoplastic radiation therapy: Secondary | ICD-10-CM | POA: Diagnosis not present

## 2018-04-04 DIAGNOSIS — C61 Malignant neoplasm of prostate: Secondary | ICD-10-CM | POA: Diagnosis not present

## 2018-04-04 NOTE — Telephone Encounter (Signed)
Patient called for test results- notified of results and that he should be getting a letter regarding them in the mail. Patient does have follow up scheduled.  Lab not in result basket.

## 2018-04-07 ENCOUNTER — Ambulatory Visit
Admission: RE | Admit: 2018-04-07 | Discharge: 2018-04-07 | Disposition: A | Discharge status: Home / Self Care | Payer: Medicare HMO | Source: Ambulatory Visit | Attending: Radiation Oncology | Admitting: Radiation Oncology

## 2018-04-07 ENCOUNTER — Encounter: Payer: Self-pay | Admitting: Medical Oncology

## 2018-04-07 DIAGNOSIS — Z510 Encounter for antineoplastic radiation therapy: Secondary | ICD-10-CM | POA: Diagnosis not present

## 2018-04-08 ENCOUNTER — Ambulatory Visit
Admission: RE | Admit: 2018-04-08 | Discharge: 2018-04-08 | Disposition: A | Discharge status: Home / Self Care | Payer: Medicare HMO | Source: Ambulatory Visit | Attending: Radiation Oncology | Admitting: Radiation Oncology

## 2018-04-08 DIAGNOSIS — Z510 Encounter for antineoplastic radiation therapy: Secondary | ICD-10-CM | POA: Diagnosis not present

## 2018-04-09 ENCOUNTER — Ambulatory Visit (HOSPITAL_COMMUNITY)
Admission: RE | Admit: 2018-04-09 | Discharge: 2018-04-09 | Disposition: A | Discharge status: Home / Self Care | Payer: Medicare HMO | Source: Ambulatory Visit | Attending: Family Medicine | Admitting: Family Medicine

## 2018-04-09 ENCOUNTER — Ambulatory Visit
Admission: RE | Admit: 2018-04-09 | Discharge: 2018-04-09 | Disposition: A | Discharge status: Home / Self Care | Payer: Medicare HMO | Source: Ambulatory Visit | Attending: Radiation Oncology | Admitting: Radiation Oncology

## 2018-04-09 DIAGNOSIS — E010 Iodine-deficiency related diffuse (endemic) goiter: Secondary | ICD-10-CM | POA: Insufficient documentation

## 2018-04-09 DIAGNOSIS — R221 Localized swelling, mass and lump, neck: Secondary | ICD-10-CM | POA: Diagnosis not present

## 2018-04-09 DIAGNOSIS — Z510 Encounter for antineoplastic radiation therapy: Secondary | ICD-10-CM | POA: Diagnosis not present

## 2018-04-10 ENCOUNTER — Ambulatory Visit
Admission: RE | Admit: 2018-04-10 | Discharge: 2018-04-10 | Disposition: A | Discharge status: Home / Self Care | Payer: Medicare HMO | Source: Ambulatory Visit | Attending: Radiation Oncology | Admitting: Radiation Oncology

## 2018-04-10 DIAGNOSIS — Z510 Encounter for antineoplastic radiation therapy: Secondary | ICD-10-CM | POA: Diagnosis not present

## 2018-04-11 ENCOUNTER — Ambulatory Visit
Admission: RE | Admit: 2018-04-11 | Discharge: 2018-04-11 | Disposition: A | Discharge status: Home / Self Care | Payer: Medicare HMO | Source: Ambulatory Visit | Attending: Radiation Oncology | Admitting: Radiation Oncology

## 2018-04-11 ENCOUNTER — Ambulatory Visit (INDEPENDENT_AMBULATORY_CARE_PROVIDER_SITE_OTHER): Payer: Medicare HMO | Admitting: Family Medicine

## 2018-04-11 ENCOUNTER — Encounter: Payer: Self-pay | Admitting: Family Medicine

## 2018-04-11 ENCOUNTER — Telehealth: Payer: Self-pay | Admitting: Family Medicine

## 2018-04-11 VITALS — BP 142/90 | HR 82 | Ht 70.0 in | Wt 193.0 lb

## 2018-04-11 DIAGNOSIS — N183 Chronic kidney disease, stage 3 unspecified: Secondary | ICD-10-CM

## 2018-04-11 DIAGNOSIS — B182 Chronic viral hepatitis C: Secondary | ICD-10-CM | POA: Diagnosis not present

## 2018-04-11 DIAGNOSIS — I739 Peripheral vascular disease, unspecified: Secondary | ICD-10-CM

## 2018-04-11 DIAGNOSIS — Z510 Encounter for antineoplastic radiation therapy: Secondary | ICD-10-CM | POA: Diagnosis not present

## 2018-04-11 DIAGNOSIS — I10 Essential (primary) hypertension: Secondary | ICD-10-CM

## 2018-04-11 MED ORDER — LOSARTAN POTASSIUM 50 MG PO TABS: 50.0000 mg | ORAL_TABLET | Freq: Every day | ORAL | 0 refills | Status: DC

## 2018-04-11 NOTE — Progress Notes (Signed)
Subjective:  Patient ID: Casey Greene, male    DOB: 04-02-1951  Age: 67 y.o. MRN: [id number]  CC: Follow-up   HPI Casey Greene presents for follow-up of his blood pressure.  He is taking losartan 25 mg only.  His pressure is up today.  He has been on up to 4 medicines in the past for high blood pressure.  He is sleeping on 2 pillows.  This is usual for him.  He denies shortness of breath dyspnea on exertion or chest pain.  He denies lower extremity edema.  He is at stage III chronic kidney disease.  He is status post prostatectomy for prostate cancer.  He is dealing with incontinence and wears a depends.  He is scheduled for radiation therapy.  He has a history of hep C that has not been treated.  His past insurance would not cover it.  He is accompanied by his wife today who recently had her knee replaced.  Patient does not smoke drink alcohol or use illicit drugs.  Outpatient Medications Prior to Visit  Medication Sig Dispense Refill  . Calcium Carbonate-Vit D-Min (CALTRATE 600+D PLUS PO) Take by mouth.    . trospium (SANCTURA) 20 MG tablet Take 20 mg by mouth 2 (two) times daily.    Marland Kitchen losartan (COZAAR) 25 MG tablet TAKE 1 TABLET(25 MG) BY MOUTH DAILY 90 tablet 0  . furosemide (LASIX) 20 MG tablet Take 1 tablet (20 mg total) by mouth 2 (two) times daily. (Patient not taking: Reported on 03/12/2018) 3 tablet 0  . metoprolol tartrate (LOPRESSOR) 25 MG tablet Take 1 tablet (25 mg total) by mouth 2 (two) times daily. (Patient not taking: Reported on 07/08/2016) 30 tablet 0  . HYDROcodone-acetaminophen (NORCO) 5-325 MG tablet Take 1-2 tablets by mouth every 6 (six) hours as needed. (Patient not taking: Reported on 07/08/2016) 30 tablet 0   No facility-administered medications prior to visit.     ROS Review of Systems  Constitutional: Negative for chills, fatigue, fever and unexpected weight change.  HENT: Negative.   Eyes: Negative.   Respiratory: Negative for chest tightness and shortness of  breath.   Cardiovascular: Negative for chest pain, palpitations and leg swelling.  Gastrointestinal: Negative.   Endocrine: Negative for polyphagia and polyuria.  Genitourinary: Negative for decreased urine volume, difficulty urinating and hematuria.  Skin: Negative for color change and rash.  Allergic/Immunologic: Negative for immunocompromised state.  Neurological: Negative for weakness and headaches.  Hematological: Does not bruise/bleed easily.  Psychiatric/Behavioral: Negative.     Objective:  BP (!) 142/90   Pulse 82   Ht 5\' 10"  (1.778 m)   Wt 193 lb (87.5 kg)   SpO2 98%   BMI 27.69 kg/m   BP Readings from Last 3 Encounters:  04/11/18 (!) 142/90  03/28/18 134/82  03/12/18 (!) 165/122    Wt Readings from Last 3 Encounters:  04/11/18 193 lb (87.5 kg)  03/28/18 191 lb 12.8 oz (87 kg)  03/12/18 190 lb 6.4 oz (86.4 kg)    Physical Exam  Constitutional: He is oriented to person, place, and time. He appears well-developed and well-nourished. No distress.  HENT:  Head: Normocephalic and atraumatic.  Right Ear: External ear normal.  Left Ear: External ear normal.  Nose: Nose normal.  Mouth/Throat: Oropharynx is clear and moist. No oropharyngeal exudate.  Eyes: Pupils are equal, round, and reactive to light. Conjunctivae and EOM are normal. Right eye exhibits no discharge. Left eye exhibits no discharge. No scleral icterus.  Neck:  Normal range of motion. No JVD present. No tracheal deviation present. No thyromegaly present.  Cardiovascular: Normal rate, regular rhythm and normal heart sounds.  Pulmonary/Chest: Effort normal and breath sounds normal.  Abdominal: Bowel sounds are normal.  Neurological: He is alert and oriented to person, place, and time.  Skin: Skin is warm and dry. He is not diaphoretic.  Psychiatric: He has a normal mood and affect. His behavior is normal.    Lab Results  Component Value Date   WBC 6.1 03/28/2018   HGB 13.6 03/28/2018   HCT 39.4  03/28/2018   PLT 158.0 03/28/2018   GLUCOSE 91 03/28/2018   CHOL 190 03/28/2018   TRIG 61.0 03/28/2018   HDL 61.90 03/28/2018   LDLCALC 116 (H) 03/28/2018   ALT 68 (H) 03/28/2018   AST 87 (H) 03/28/2018   NA 139 03/28/2018   K 3.6 03/28/2018   CL 98 03/28/2018   CREATININE 2.01 (H) 03/28/2018   BUN 50 (H) 03/28/2018   CO2 33 (H) 03/28/2018   TSH 2.72 03/28/2018   PSA 17.81 (H) 11/29/2009   INR 1.02 07/02/2011   MICROALBUR 13.8 (H) 03/28/2018    No results found.  Assessment & Plan:   Casey Greene was seen today for follow-up.  Diagnoses and all orders for this visit:  Essential hypertension -     losartan (COZAAR) 50 MG tablet; Take 1 tablet (50 mg total) by mouth daily.  PVD (peripheral vascular disease) (HCC) -     losartan (COZAAR) 50 MG tablet; Take 1 tablet (50 mg total) by mouth daily.  Chronic hepatitis C without hepatic coma (HCC) -     Amb Referral to Hepatology -     US Abdomen Limited RUQ; Future -     Hepatitis C RNA quantitative  CKD (chronic kidney disease) stage 3, GFR 30-59 ml/min (HCC) -     losartan (COZAAR) 50 MG tablet; Take 1 tablet (50 mg total) by mouth daily.   I have discontinued Casey Greene's HYDROcodone-acetaminophen and losartan. I am also having him start on losartan. Additionally, I am having him maintain his metoprolol tartrate, furosemide, trospium, and Calcium Carbonate-Vit D-Min (CALTRATE 600+D PLUS PO).  Meds ordered this encounter  Medications  . losartan (COZAAR) 50 MG tablet    Sig: Take 1 tablet (50 mg total) by mouth daily.    Dispense:  90 tablet    Refill:  0     Follow-up: Return in about 1 month (around 05/12/2018).  Libby Maw, MD

## 2018-04-11 NOTE — Telephone Encounter (Signed)
Rx resent.

## 2018-04-11 NOTE — Telephone Encounter (Signed)
Copied from [place] [phone]. Topic: Quick Communication - See Telephone Encounter >> Apr 11, 2018  1:44 PM Conception Chancy, NT wrote: CRM for notification. See Telephone encounter for: 04/11/18.  Patient spouse is calling and states losartan (COZAAR) 50 MG tablet  was sent to the wrong pharmacy. [facility] to [facility] of [facility]. Please advise.  [id number] [address], [place] [address] [place] [address] Phone: [phone] Fax: [phone]

## 2018-04-14 ENCOUNTER — Ambulatory Visit
Admission: RE | Admit: 2018-04-14 | Discharge: 2018-04-14 | Disposition: A | Discharge status: Home / Self Care | Payer: Medicare HMO | Source: Ambulatory Visit | Attending: Radiation Oncology | Admitting: Radiation Oncology

## 2018-04-14 DIAGNOSIS — Z510 Encounter for antineoplastic radiation therapy: Secondary | ICD-10-CM | POA: Diagnosis not present

## 2018-04-14 LAB — HEPATITIS C RNA QUANTITATIVE
HCV Quantitative Log: 6.55 Log IU/mL — ABNORMAL HIGH
HCV RNA, PCR, QN: 3550000 [IU]/mL — AB

## 2018-04-15 ENCOUNTER — Ambulatory Visit
Admission: RE | Admit: 2018-04-15 | Discharge: 2018-04-15 | Disposition: A | Discharge status: Home / Self Care | Payer: Medicare HMO | Source: Ambulatory Visit | Attending: Radiation Oncology | Admitting: Radiation Oncology

## 2018-04-15 DIAGNOSIS — Z510 Encounter for antineoplastic radiation therapy: Secondary | ICD-10-CM | POA: Diagnosis not present

## 2018-04-16 ENCOUNTER — Ambulatory Visit
Admission: RE | Admit: 2018-04-16 | Discharge: 2018-04-16 | Disposition: A | Discharge status: Home / Self Care | Payer: Medicare HMO | Source: Ambulatory Visit | Attending: Radiation Oncology | Admitting: Radiation Oncology

## 2018-04-16 DIAGNOSIS — Z510 Encounter for antineoplastic radiation therapy: Secondary | ICD-10-CM | POA: Diagnosis not present

## 2018-04-17 ENCOUNTER — Ambulatory Visit
Admission: RE | Admit: 2018-04-17 | Discharge: 2018-04-17 | Disposition: A | Discharge status: Home / Self Care | Payer: Medicare HMO | Source: Ambulatory Visit | Attending: Radiation Oncology | Admitting: Radiation Oncology

## 2018-04-17 DIAGNOSIS — Z510 Encounter for antineoplastic radiation therapy: Secondary | ICD-10-CM | POA: Diagnosis not present

## 2018-04-18 ENCOUNTER — Ambulatory Visit
Admission: RE | Admit: 2018-04-18 | Discharge: 2018-04-18 | Disposition: A | Discharge status: Home / Self Care | Payer: Medicare HMO | Source: Ambulatory Visit | Attending: Radiation Oncology | Admitting: Radiation Oncology

## 2018-04-18 DIAGNOSIS — Z510 Encounter for antineoplastic radiation therapy: Secondary | ICD-10-CM | POA: Diagnosis not present

## 2018-04-22 ENCOUNTER — Ambulatory Visit
Admission: RE | Admit: 2018-04-22 | Discharge: 2018-04-22 | Disposition: A | Discharge status: Home / Self Care | Payer: Medicare HMO | Source: Ambulatory Visit | Attending: Radiation Oncology | Admitting: Radiation Oncology

## 2018-04-22 ENCOUNTER — Ambulatory Visit: Payer: Medicare HMO

## 2018-04-22 DIAGNOSIS — Z510 Encounter for antineoplastic radiation therapy: Secondary | ICD-10-CM | POA: Diagnosis not present

## 2018-04-23 ENCOUNTER — Ambulatory Visit
Admission: RE | Admit: 2018-04-23 | Discharge: 2018-04-23 | Disposition: A | Discharge status: Home / Self Care | Payer: Medicare HMO | Source: Ambulatory Visit | Attending: Radiation Oncology | Admitting: Radiation Oncology

## 2018-04-23 ENCOUNTER — Other Ambulatory Visit: Payer: Medicare HMO

## 2018-04-23 DIAGNOSIS — Z510 Encounter for antineoplastic radiation therapy: Secondary | ICD-10-CM | POA: Diagnosis not present

## 2018-04-24 ENCOUNTER — Ambulatory Visit
Admission: RE | Admit: 2018-04-24 | Discharge: 2018-04-24 | Disposition: A | Discharge status: Home / Self Care | Payer: Medicare HMO | Source: Ambulatory Visit | Attending: Radiation Oncology | Admitting: Radiation Oncology

## 2018-04-24 DIAGNOSIS — Z510 Encounter for antineoplastic radiation therapy: Secondary | ICD-10-CM | POA: Diagnosis not present

## 2018-04-25 ENCOUNTER — Ambulatory Visit
Admission: RE | Admit: 2018-04-25 | Discharge: 2018-04-25 | Disposition: A | Discharge status: Home / Self Care | Payer: Medicare HMO | Source: Ambulatory Visit | Attending: Radiation Oncology | Admitting: Radiation Oncology

## 2018-04-25 DIAGNOSIS — Z510 Encounter for antineoplastic radiation therapy: Secondary | ICD-10-CM | POA: Diagnosis not present

## 2018-04-28 ENCOUNTER — Ambulatory Visit
Admission: RE | Admit: 2018-04-28 | Discharge: 2018-04-28 | Disposition: A | Discharge status: Home / Self Care | Payer: Medicare HMO | Source: Ambulatory Visit | Attending: Radiation Oncology | Admitting: Radiation Oncology

## 2018-04-28 DIAGNOSIS — Z510 Encounter for antineoplastic radiation therapy: Secondary | ICD-10-CM | POA: Diagnosis not present

## 2018-04-28 DIAGNOSIS — C61 Malignant neoplasm of prostate: Secondary | ICD-10-CM | POA: Diagnosis not present

## 2018-04-29 ENCOUNTER — Ambulatory Visit
Admission: RE | Admit: 2018-04-29 | Discharge: 2018-04-29 | Disposition: A | Discharge status: Home / Self Care | Payer: Medicare HMO | Source: Ambulatory Visit | Attending: Radiation Oncology | Admitting: Radiation Oncology

## 2018-04-29 DIAGNOSIS — Z510 Encounter for antineoplastic radiation therapy: Secondary | ICD-10-CM | POA: Diagnosis not present

## 2018-04-30 ENCOUNTER — Ambulatory Visit
Admission: RE | Admit: 2018-04-30 | Discharge: 2018-04-30 | Disposition: A | Discharge status: Home / Self Care | Payer: Medicare HMO | Source: Ambulatory Visit | Attending: Radiation Oncology | Admitting: Radiation Oncology

## 2018-04-30 ENCOUNTER — Other Ambulatory Visit: Payer: Self-pay | Admitting: Radiation Oncology

## 2018-04-30 ENCOUNTER — Ambulatory Visit: Payer: Medicare HMO

## 2018-04-30 DIAGNOSIS — Z510 Encounter for antineoplastic radiation therapy: Secondary | ICD-10-CM | POA: Diagnosis not present

## 2018-05-01 ENCOUNTER — Encounter: Payer: Self-pay | Admitting: Internal Medicine

## 2018-05-01 ENCOUNTER — Ambulatory Visit
Admission: RE | Admit: 2018-05-01 | Discharge: 2018-05-01 | Disposition: A | Discharge status: Home / Self Care | Payer: Medicare HMO | Source: Ambulatory Visit | Attending: Radiation Oncology | Admitting: Radiation Oncology

## 2018-05-01 ENCOUNTER — Ambulatory Visit (INDEPENDENT_AMBULATORY_CARE_PROVIDER_SITE_OTHER): Payer: Medicare HMO | Admitting: Internal Medicine

## 2018-05-01 VITALS — BP 143/81 | HR 71 | Temp 97.6°F | Ht 70.0 in | Wt 189.0 lb

## 2018-05-01 DIAGNOSIS — B182 Chronic viral hepatitis C: Secondary | ICD-10-CM | POA: Diagnosis not present

## 2018-05-01 DIAGNOSIS — Z23 Encounter for immunization: Secondary | ICD-10-CM | POA: Diagnosis not present

## 2018-05-01 DIAGNOSIS — Z510 Encounter for antineoplastic radiation therapy: Secondary | ICD-10-CM | POA: Diagnosis not present

## 2018-05-01 NOTE — Progress Notes (Signed)
[facility]   CC: consideration for treatment for chronic hepatitis C  HPI:  +Casey Greene is a 67 y.o. male who presents for initial evaluation and management of chronic hepatitis C with GT2b with 3.78M VL.  Patient tested positive - diagnosed over 10 years ago. Hepatitis C-associated risk factors present [id number] Patient denies any jaundice, any IVDU. Patient has only had ultrasound of liver. Patient has had prior treatment for Hepatitis C - tried only 1 dose of interferon but felt horrible and discontinued treatment. Patient does not have a past history of liver disease. Patient does not have a family history of liver disease. Patient does not  have associated signs or symptoms related to liver disease.  Labs reviewed and confirm chronic hepatitis C with a positive viral load.  Patient does not  have documented immunity to Hepatitis A. Patient does not have documented immunity to Hepatitis B.    Records reviewed from chart  He is currently being treated for prostate cancer getting radiation treatment and hormonal therapy (Q 6 months), fatigued.- Dr. Tammi Klippel, rad-onc, has been doing 7 wk of M-F daily radiation which ends on July 5th. Follows by Dr Junious Silk.  - metastatic prostatic adenocarcinoma diagnosed initially in 2000 with Gleason score 3+4 and PSA 39.38 pre-treatment, and  in 2009 and underwent a repeat biopsy confirming Gleason 4+4 disease. He saw Dr. Alinda Money for consultation in 2014 after he developed urinary retention secondary to BPH.He underwent robotic prostatectomy with Dr. Alinda Money on 01/26/2016 with final pathology revealing [id number], Gleason 3+4 adenocarcinoma of the prostate with positive surgical margin at the right posterior margin and 2 of 25 lymph nodes positive. PSA nadir was 1.82 but quickly began rising thereafter.  PSA was 3.66 05/2016 and PSA as of 12/27/2017 is up to 21.70.  A CT abdomen/pelvis was performed on 01/09/2018 f showed no evidence for  recurrent or metastatic disease.  He had a bone scan on 01/16/2018 which also showed no acute or focal bony abnormality and no evidence of metastatic disease.  He was started on ADT with Lupron 45mg  on 02/10/2018 (next dose due in six months).  Review of Systems: All other systems reviewed and are negative  with exception to incontinence and fatigue   Past Medical History:  Diagnosis Date  . Decreased appetite   . Foley catheter in place   . Hepatitis C   . History of gout   . Hypertension   . Prostate cancer ([place])   . Slow heartbeat   . Urinary retention     Prior to Admission medications   Medication Sig Start Date End Date Taking? Authorizing Provider  furosemide (LASIX) 20 MG tablet Take 1 tablet (20 mg total) by mouth 2 (two) times daily. 07/08/16  Yes Davonna Belling, MD  losartan (COZAAR) 50 MG tablet Take 1 tablet (50 mg total) by mouth daily. 04/11/18  Yes Libby Maw, MD  trospium (SANCTURA) 20 MG tablet Take 20 mg by mouth 2 (two) times daily.   Yes [provider]  Calcium Carbonate-Vit D-Min (CALTRATE 600+D PLUS PO) Take by mouth.    [provider]  metoprolol tartrate (LOPRESSOR) 25 MG tablet Take 1 tablet (25 mg total) by mouth 2 (two) times daily. Patient not taking: Reported on 07/08/2016 01/27/16   Debbrah Alar, [name]    Allergies  Allergen Reactions  . Amlodipine Other (See Comments)    headache  . Tamsulosin Hcl     Social History   Tobacco  Use  . Smoking status: Former Smoker    Packs/day: 0.50    Years: 10.00    Pack years: 5.00    Types: Cigarettes    Last attempt to quit: 01/11/2006    Years since quitting: 12.3  . Smokeless tobacco: Never Used  Substance Use Topics  . Alcohol use: No  . Drug use: No    Family History  Problem Relation Age of Onset  . Heart disease Mother   . Obesity Mother   . Cervical cancer Mother   . Heart disease Father   . Diabetes Father   . ALS Sister   . Colon cancer Neg Hx        Objective:   Vitals:   05/01/18 1358  BP: (!) 143/81  Pulse: 71  Temp: 97.6 F (36.4 C)   Constitutional: He is oriented to person, place, and time. He appears well-developed and well-nourished. No distress.  HENT:  Mouth/Throat: Oropharynx is clear and moist. No oropharyngeal exudate.  Cardiovascular: Normal rate, regular rhythm and normal heart sounds. Exam reveals no gallop and no friction rub.  No murmur heard.  Pulmonary/Chest: Effort normal and breath sounds normal. No respiratory distress. He has no wheezes.  Abdominal: Soft. Bowel sounds are normal. He exhibits no distension. There is no tenderness.  Lymphadenopathy:  He has no cervical adenopathy.  Neurological: He is alert and oriented to person, place, and time.  Skin: Skin is warm and dry. No rash noted. No erythema.  Psychiatric: He has a normal mood and affect. His behavior is normal.      Laboratory Genotype:  Lab Results  Component Value Date   HCVGENOTYPE 2b 05/24/2010   HCV viral load:  Lab Results  Component Value Date   HCVQUANT 3960000 (H) 05/24/2010   Lab Results  Component Value Date   WBC 6.1 03/28/2018   HGB 13.6 03/28/2018   HCT 39.4 03/28/2018   MCV 91.7 03/28/2018   PLT 158.0 03/28/2018    Lab Results  Component Value Date   CREATININE 2.01 (H) 03/28/2018   BUN 50 (H) 03/28/2018   NA 139 03/28/2018   K 3.6 03/28/2018   CL 98 03/28/2018   CO2 33 (H) 03/28/2018    Lab Results  Component Value Date   ALT 68 (H) 03/28/2018   AST 87 (H) 03/28/2018   ALKPHOS 54 03/28/2018    Lab Results  Component Value Date   INR 1.02 07/02/2011   BILITOT 0.7 03/28/2018   ALBUMIN 4.0 03/28/2018     Assessment: New Patient with Chronic Hepatitis C genotype 2b, untreated.  I discussed with the patient the lab findings that confirm chronic hepatitis C as well as the natural history and progression of disease including about 30% of people who develop cirrhosis of the liver if left untreated and  once cirrhosis is established there is a 2-7% risk per year of liver cancer and liver failure.  I discussed the importance of treatment and benefits in reducing the risk, even if significant liver fibrosis exists.   Plan: 1) Patient counseled extensively on limiting acetaminophen to no more than 2 grams daily, avoidance of alcohol. 2) Transmission discussed with patient including sexual transmission, sharing razors and toothbrush.   3) Will need referral to gastroenterology if concern for cirrhosis - will find out based on ultrasound with elastography for which we will order 4)Will prescribe mayverit or eplucsa depending on which agent his insurance approves 5) Hepatitis A vaccine - will start vaccines today 6)  Hepatitis B vaccine- will start vaccines today 7) Pneumovax vaccine if concern for cirrhosis ( will check with pcp if he has not already done so. Not seen in his records) 8) Further work up to include liver staging with elastography 10) will follow up after 1-2 months. Will have cassie start seeing for pharmacy monitoring  --- No drug interaction for his prostate ca

## 2018-05-01 NOTE — Progress Notes (Signed)
HPI: Casey Greene is a 67 y.o. male who presents to the [facility] for his initial Hep C visit with Dr. Baxter Flattery.  Patient Active Problem List   Diagnosis Date Noted  . Mass of thyroid region 03/28/2018  . Anemia 03/28/2018  . Noncompliance by refusing intervention or support 10/22/2016  . Refusal of medication 10/22/2016  . Edema 07/09/2016  . Depression 02/27/2016  . Prostate cancer ([place]) 01/26/2016  . CKD (chronic kidney disease) stage 3, GFR 30-59 ml/min (HCC) 10/13/2014  . Chronic hepatitis C without hepatic coma ([place]) 07/05/2014  . Hyperlipidemia 06/29/2014  . Essential hypertension 06/09/2014  . Onychomycosis 12/01/2013  . Pain, foot 12/01/2013  . PVD (peripheral vascular disease) ([place]) 12/01/2013    Patient's Medications  New Prescriptions   No medications on file  Previous Medications   CALCIUM CARBONATE-VIT D-MIN (CALTRATE 600+D PLUS PO)    Take by mouth.   FUROSEMIDE (LASIX) 20 MG TABLET    Take 1 tablet (20 mg total) by mouth 2 (two) times daily.   LOSARTAN (COZAAR) 50 MG TABLET    Take 1 tablet (50 mg total) by mouth daily.   METOPROLOL TARTRATE (LOPRESSOR) 25 MG TABLET    Take 1 tablet (25 mg total) by mouth 2 (two) times daily.   TROSPIUM (SANCTURA) 20 MG TABLET    Take 20 mg by mouth 2 (two) times daily.  Modified Medications   No medications on file  Discontinued Medications   No medications on file    Allergies: Allergies  Allergen Reactions  . Amlodipine Other (See Comments)    headache  . Tamsulosin Hcl     Past Medical History: Past Medical History:  Diagnosis Date  . Decreased appetite   . Foley catheter in place   . Hepatitis C   . History of gout   . Hypertension   . Prostate cancer ([place])   . Slow heartbeat   . Urinary retention     Social History: Social History   Socioeconomic History  . Marital status: Married    Spouse name: Not on file  . Number of children: Not on file  . Years of education: Not on file  . Highest  education level: Not on file  Occupational History  . Not on file  Social Needs  . Financial resource strain: Not on file  . Food insecurity:    Worry: Not on file    Inability: Not on file  . Transportation needs:    Medical: Not on file    Non-medical: Not on file  Tobacco Use  . Smoking status: Former Smoker    Packs/day: 0.50    Years: 10.00    Pack years: 5.00    Types: Cigarettes    Last attempt to quit: 01/11/2006    Years since quitting: 12.3  . Smokeless tobacco: Never Used  Substance and Sexual Activity  . Alcohol use: No  . Drug use: No  . Sexual activity: Not Currently  Lifestyle  . Physical activity:    Days per week: Not on file    Minutes per session: Not on file  . Stress: Not on file  Relationships  . Social connections:    Talks on phone: Not on file    Gets together: Not on file    Attends religious service: Not on file    Active member of club or organization: Not on file    Attends meetings of clubs or organizations: Not on file    Relationship  status: Not on file  Other Topics Concern  . Not on file  Social History Narrative  . Not on file    Labs: Hepatitis C Lab Results  Component Value Date   HCVGENOTYPE 2b 05/24/2010   HCVRNAPCRQN 3,550,000 (H) 04/11/2018   Hepatitis B Lab Results  Component Value Date   HEPBSAB NEG 05/24/2010   HEPBSAG NEG 12/01/2009   HEPBCAB NEG 05/24/2010   Hepatitis A Lab Results  Component Value Date   HAV NEG 05/24/2010   HIV No results found for: HIV Lab Results  Component Value Date   CREATININE 2.01 (H) 03/28/2018   CREATININE 1.45 (H) 09/08/2016   CREATININE 1.50 (H) 07/08/2016   CREATININE 2.49 (H) 01/28/2016   CREATININE 3.15 (H) 01/27/2016   Lab Results  Component Value Date   AST 87 (H) 03/28/2018   AST 61 (H) 07/08/2016   AST 61 (H) 07/02/2011   ALT 68 (H) 03/28/2018   ALT 47 07/08/2016   ALT 54 (H) 07/02/2011   INR 1.02 07/02/2011   INR 1.13 05/24/2010    Fibrosis  Score: Pending elastography  Assessment: Casey Greene is here today to initiate care with Dr. Baxter Flattery for his Hep C infection.  He attempted previous treatment with interferon but states he took one dose and quit.  He has [employer].  We have a Hep C viral load for him of 3.5 million with genotype 2b virus. We do not have a F score for him yet.  He had an ultrasound of his abdomen scheduled for tomorrow but it was incorrect and needed elastography with it, so Dr. Baxter Flattery called and they had to cancel and will reschedule patient for correct ultrasound.  He has some CKD with most recent CrCl of ~42 ml/min.  He will either get Mavyret for 8-12 weeks or Epclusa for 12 weeks depending on his labs.  I discussed all of this with the patient and what to expect on treatment.  Counseled on most common side effects such as headaches, nausea, and fatigue.  Casey Greene will follow-up with patient once all labs have returned.   Of note, he is getting Leupron and radiation for prostate cancer. Leupron does not interact with Epclusa or Mavyret.  He is not taking anything for acid reflux OTC or prescription.   Plan: - CBC w diff, CMET, INR, and fibrosure - US abdomen with elastography - Will f/u once medication decided  Modupe Shampine L. Anacarolina Evelyn, PharmD, [facility] 05/01/2018, 2:47 PM

## 2018-05-02 ENCOUNTER — Other Ambulatory Visit: Payer: Medicare HMO

## 2018-05-02 ENCOUNTER — Ambulatory Visit: Payer: Medicare HMO

## 2018-05-02 LAB — COMPLETE METABOLIC PANEL WITH GFR
AG Ratio: 1 (calc) (ref 1.0–2.5)
ALT: 41 U/L (ref 9–46)
AST: 60 U/L — AB (ref 10–35)
Albumin: 4.1 g/dL (ref 3.6–5.1)
Alkaline phosphatase (APISO): 47 U/L (ref 40–115)
BUN/Creatinine Ratio: 20 (calc) (ref 6–22)
BUN: 53 mg/dL — ABNORMAL HIGH (ref 7–25)
CALCIUM: 9.6 mg/dL (ref 8.6–10.3)
CO2: 33 mmol/L — AB (ref 20–32)
CREATININE: 2.61 mg/dL — AB (ref 0.70–1.25)
Chloride: 98 mmol/L (ref 98–110)
GFR, EST NON AFRICAN AMERICAN: 24 mL/min/{1.73_m2} — AB (ref >=60)
GFR, Est African American: 28 mL/min/{1.73_m2} — ABNORMAL LOW (ref >=60)
GLOBULIN: 4 g/dL — AB (ref 1.9–3.7)
Glucose, Bld: 94 mg/dL (ref 65–99)
Potassium: 3.6 mmol/L (ref 3.5–5.3)
SODIUM: 141 mmol/L (ref 135–146)
TOTAL PROTEIN: 8.1 g/dL (ref 6.1–8.1)
Total Bilirubin: 0.7 mg/dL (ref 0.2–1.2)

## 2018-05-02 LAB — PROTIME-INR
INR: 1
PROTHROMBIN TIME: 10.7 s (ref 9.0–11.5)

## 2018-05-05 ENCOUNTER — Ambulatory Visit
Admission: RE | Admit: 2018-05-05 | Discharge: 2018-05-05 | Disposition: A | Discharge status: Home / Self Care | Payer: Medicare HMO | Source: Ambulatory Visit | Attending: Radiation Oncology | Admitting: Radiation Oncology

## 2018-05-05 DIAGNOSIS — Z510 Encounter for antineoplastic radiation therapy: Secondary | ICD-10-CM | POA: Diagnosis not present

## 2018-05-06 ENCOUNTER — Ambulatory Visit
Admission: RE | Admit: 2018-05-06 | Discharge: 2018-05-06 | Disposition: A | Discharge status: Home / Self Care | Payer: Medicare HMO | Source: Ambulatory Visit | Attending: Radiation Oncology | Admitting: Radiation Oncology

## 2018-05-06 DIAGNOSIS — Z510 Encounter for antineoplastic radiation therapy: Secondary | ICD-10-CM | POA: Diagnosis not present

## 2018-05-06 LAB — LIVER FIBROSIS, FIBROTEST-ACTITEST
ALT: 41 U/L (ref 9–46)
APOLIPOPROTEIN A1: 164 mg/dL (ref 94–176)
Alpha-2-Macroglobulin: 358 mg/dL — ABNORMAL HIGH (ref 106–279)
BILIRUBIN: 0.4 mg/dL (ref 0.2–1.2)
Fibrosis Score: 0.54
GGT: 33 U/L (ref 3–70)
Haptoglobin: 116 mg/dL (ref 43–212)
Necroinflammat ACT Score: 0.29
Reference ID: 2503533

## 2018-05-07 ENCOUNTER — Ambulatory Visit
Admission: RE | Admit: 2018-05-07 | Discharge: 2018-05-07 | Disposition: A | Discharge status: Home / Self Care | Payer: Medicare HMO | Source: Ambulatory Visit | Attending: Radiation Oncology | Admitting: Radiation Oncology

## 2018-05-07 DIAGNOSIS — Z510 Encounter for antineoplastic radiation therapy: Secondary | ICD-10-CM | POA: Diagnosis not present

## 2018-05-08 ENCOUNTER — Ambulatory Visit
Admission: RE | Admit: 2018-05-08 | Discharge: 2018-05-08 | Disposition: A | Discharge status: Home / Self Care | Payer: Medicare HMO | Source: Ambulatory Visit | Attending: Radiation Oncology | Admitting: Radiation Oncology

## 2018-05-08 DIAGNOSIS — Z510 Encounter for antineoplastic radiation therapy: Secondary | ICD-10-CM | POA: Diagnosis not present

## 2018-05-09 ENCOUNTER — Ambulatory Visit
Admission: RE | Admit: 2018-05-09 | Discharge: 2018-05-09 | Disposition: A | Discharge status: Home / Self Care | Payer: Medicare HMO | Source: Ambulatory Visit | Attending: Internal Medicine | Admitting: Internal Medicine

## 2018-05-09 ENCOUNTER — Ambulatory Visit
Admission: RE | Admit: 2018-05-09 | Discharge: 2018-05-09 | Disposition: A | Discharge status: Home / Self Care | Payer: Medicare HMO | Source: Ambulatory Visit | Attending: Radiation Oncology | Admitting: Radiation Oncology

## 2018-05-09 DIAGNOSIS — B182 Chronic viral hepatitis C: Secondary | ICD-10-CM

## 2018-05-09 DIAGNOSIS — Z510 Encounter for antineoplastic radiation therapy: Secondary | ICD-10-CM | POA: Diagnosis not present

## 2018-05-12 ENCOUNTER — Ambulatory Visit
Admission: RE | Admit: 2018-05-12 | Discharge: 2018-05-12 | Disposition: A | Discharge status: Home / Self Care | Payer: Medicare HMO | Source: Ambulatory Visit | Attending: Radiation Oncology | Admitting: Radiation Oncology

## 2018-05-12 DIAGNOSIS — Z510 Encounter for antineoplastic radiation therapy: Secondary | ICD-10-CM | POA: Diagnosis not present

## 2018-05-13 ENCOUNTER — Ambulatory Visit: Payer: Medicare HMO

## 2018-05-13 DIAGNOSIS — Z510 Encounter for antineoplastic radiation therapy: Secondary | ICD-10-CM | POA: Diagnosis not present

## 2018-05-14 ENCOUNTER — Ambulatory Visit: Payer: Medicare HMO

## 2018-05-14 ENCOUNTER — Other Ambulatory Visit: Payer: Self-pay | Admitting: Pharmacist

## 2018-05-14 DIAGNOSIS — Z510 Encounter for antineoplastic radiation therapy: Secondary | ICD-10-CM | POA: Diagnosis not present

## 2018-05-14 DIAGNOSIS — B182 Chronic viral hepatitis C: Secondary | ICD-10-CM

## 2018-05-14 MED ORDER — GLECAPREVIR-PIBRENTASVIR 100-40 MG PO TABS: 3.0000 | ORAL_TABLET | Freq: Every day | ORAL | 2 refills | Status: DC

## 2018-05-14 NOTE — Progress Notes (Signed)
Elastography back with F3/F4. Child Pugh class A from recent lab work.  Will send in [place] x 12 weeks for patient due to declining renal function and recent CrCl estimated to be ~30-32 ml/lmin. Inez Catalina will start approval process.

## 2018-05-15 ENCOUNTER — Ambulatory Visit
Admission: RE | Admit: 2018-05-15 | Discharge: 2018-05-15 | Disposition: A | Discharge status: Home / Self Care | Payer: Medicare HMO | Source: Ambulatory Visit | Attending: Radiation Oncology | Admitting: Radiation Oncology

## 2018-05-15 ENCOUNTER — Telehealth: Payer: Self-pay

## 2018-05-15 DIAGNOSIS — Z510 Encounter for antineoplastic radiation therapy: Secondary | ICD-10-CM | POA: Diagnosis not present

## 2018-05-15 MED FILL — MAVYRET 100-40 MG TABS: 100-40 | 28 days supply | Qty: 84 | Fill #0

## 2018-05-15 NOTE — Telephone Encounter (Signed)
Casey Greene was called and counseled on his [place] therapy. He was educated on the duration of therapy (12 weeks), how to take the medicine, expected side effects, and adherence. Adherence was strongly encouraged and he was counseled to contact us if he has a concerning side effect before missing doses. He was also educated on when lab work will be obtained and how the cure visit works.  His first 28 day supply will be shipped to him tomorrow, 6/21. All questions were answered.   Atul Delucia L. Kyung Rudd, PharmD, [facility] PGY1 Pharmacy Resident

## 2018-05-16 ENCOUNTER — Encounter: Payer: Self-pay | Admitting: Family Medicine

## 2018-05-16 ENCOUNTER — Ambulatory Visit
Admission: RE | Admit: 2018-05-16 | Discharge: 2018-05-16 | Disposition: A | Discharge status: Home / Self Care | Payer: Medicare HMO | Source: Ambulatory Visit | Attending: Radiation Oncology | Admitting: Radiation Oncology

## 2018-05-16 ENCOUNTER — Ambulatory Visit (INDEPENDENT_AMBULATORY_CARE_PROVIDER_SITE_OTHER): Payer: Medicare HMO | Admitting: Family Medicine

## 2018-05-16 VITALS — BP 142/100 | HR 81 | Temp 97.9°F | Ht 70.0 in

## 2018-05-16 DIAGNOSIS — I10 Essential (primary) hypertension: Secondary | ICD-10-CM

## 2018-05-16 DIAGNOSIS — Z510 Encounter for antineoplastic radiation therapy: Secondary | ICD-10-CM | POA: Diagnosis not present

## 2018-05-16 LAB — BASIC METABOLIC PANEL
BUN: 57 mg/dL — AB (ref 6–23)
CHLORIDE: 100 meq/L (ref 96–112)
CO2: 31 meq/L (ref 19–32)
Calcium: 9.8 mg/dL (ref 8.4–10.5)
Creatinine, Ser: 2.34 mg/dL — ABNORMAL HIGH (ref 0.40–1.50)
GFR: 35.69 mL/min — ABNORMAL LOW (ref >60.00)
GLUCOSE: 104 mg/dL — AB (ref 70–99)
POTASSIUM: 3.1 meq/L — AB (ref 3.5–5.1)
Sodium: 138 mEq/L (ref 135–145)

## 2018-05-16 MED ORDER — LOSARTAN POTASSIUM 100 MG PO TABS: 100.0000 mg | ORAL_TABLET | Freq: Every day | ORAL | 0 refills | Status: DC

## 2018-05-16 NOTE — Progress Notes (Addendum)
Subjective:  Patient ID: Casey Greene, male    DOB: May 29, 1951  Age: 67 y.o. MRN: [id number]  CC: Follow-up   HPI Casey Greene presents for follow-up of his hypertension.  He is tolerating the Cozaar.  He has 2 more weeks of radiation treatment for his prostate.  He now has financial assistance to help treat his hep C.  He is having no issues taking the Cozaar.  Outpatient Medications Prior to Visit  Medication Sig Dispense Refill  . Calcium Carbonate-Vit D-Min (CALTRATE 600+D PLUS PO) Take by mouth.    . furosemide (LASIX) 20 MG tablet Take 1 tablet (20 mg total) by mouth 2 (two) times daily. 3 tablet 0  . Glecaprevir-Pibrentasvir (MAVYRET) 100-40 MG TABS Take 3 tablets by mouth daily with breakfast. 84 tablet 2  . trospium (SANCTURA) 20 MG tablet Take 20 mg by mouth 2 (two) times daily.    Marland Kitchen losartan (COZAAR) 50 MG tablet Take 1 tablet (50 mg total) by mouth daily. 90 tablet 0  . metoprolol tartrate (LOPRESSOR) 25 MG tablet Take 1 tablet (25 mg total) by mouth 2 (two) times daily. (Patient not taking: Reported on 07/08/2016) 30 tablet 0   No facility-administered medications prior to visit.     ROS Review of Systems  Constitutional: Negative.   HENT: Negative.   Eyes: Negative for photophobia and visual disturbance.  Respiratory: Negative.   Cardiovascular: Negative.   Gastrointestinal: Negative.   Genitourinary: Negative for decreased urine volume.  Neurological: Negative for headaches.    Objective:  BP (!) 142/100   Pulse 81   Temp 97.9 F (36.6 C)   Ht 5\' 10"  (1.778 m)   SpO2 99%   BMI 27.12 kg/m   BP Readings from Last 3 Encounters:  05/16/18 (!) 142/100  05/01/18 (!) 143/81  04/11/18 (!) 142/90    Wt Readings from Last 3 Encounters:  05/01/18 189 lb (85.7 kg)  04/11/18 193 lb (87.5 kg)  03/28/18 191 lb 12.8 oz (87 kg)    Physical Exam  Constitutional: He is oriented to person, place, and time.  HENT:  Head: Normocephalic and atraumatic.  Right  Ear: External ear normal.  Left Ear: External ear normal.  Eyes: Right eye exhibits no discharge. Left eye exhibits no discharge. No scleral icterus.  Neck: No JVD present. No tracheal deviation present. No thyromegaly present.  Cardiovascular: Normal rate, regular rhythm and normal heart sounds.  Pulmonary/Chest: Effort normal and breath sounds normal.  Neurological: He is alert and oriented to person, place, and time.  Psychiatric: He has a normal mood and affect. His behavior is normal.    Lab Results  Component Value Date   WBC 6.1 03/28/2018   HGB 13.6 03/28/2018   HCT 39.4 03/28/2018   PLT 158.0 03/28/2018   GLUCOSE 94 05/01/2018   CHOL 190 03/28/2018   TRIG 61.0 03/28/2018   HDL 61.90 03/28/2018   LDLCALC 116 (H) 03/28/2018   ALT 41 05/01/2018   ALT 41 05/01/2018   AST 60 (H) 05/01/2018   NA 141 05/01/2018   K 3.6 05/01/2018   CL 98 05/01/2018   CREATININE 2.61 (H) 05/01/2018   BUN 53 (H) 05/01/2018   CO2 33 (H) 05/01/2018   TSH 2.72 03/28/2018   PSA 17.81 (H) 11/29/2009   INR 1.0 05/01/2018   MICROALBUR 13.8 (H) 03/28/2018    No results found.  Assessment & Plan:   Casey Greene was seen today for follow-up.  Diagnoses and all orders for this visit:  Essential hypertension -     losartan (COZAAR) 100 MG tablet; Take 1 tablet (100 mg total) by mouth daily. -     Basic metabolic panel   I have discontinued Casey Greene's metoprolol tartrate and losartan. I am also having him start on losartan. Additionally, I am having him maintain his furosemide, trospium, Calcium Carbonate-Vit D-Min (CALTRATE 600+D PLUS PO), and Glecaprevir-Pibrentasvir.  Meds ordered this encounter  Medications  . losartan (COZAAR) 100 MG tablet    Sig: Take 1 tablet (100 mg total) by mouth daily.    Dispense:  90 tablet    Refill:  0   Increase Cozaar 100 mg daily.  He will double up on the 50s that he is got left.  BMP checked today.  Follow-up in 1 month.  Follow-up: Return in about 1  month (around 06/15/2018).  Libby Maw, MD

## 2018-05-19 ENCOUNTER — Telehealth: Payer: Self-pay | Admitting: Family Medicine

## 2018-05-19 ENCOUNTER — Encounter: Payer: Self-pay | Admitting: Pharmacy Technician

## 2018-05-19 ENCOUNTER — Ambulatory Visit
Admission: RE | Admit: 2018-05-19 | Discharge: 2018-05-19 | Disposition: A | Discharge status: Home / Self Care | Payer: Medicare HMO | Source: Ambulatory Visit | Attending: Radiation Oncology | Admitting: Radiation Oncology

## 2018-05-19 DIAGNOSIS — Z510 Encounter for antineoplastic radiation therapy: Secondary | ICD-10-CM | POA: Diagnosis not present

## 2018-05-19 NOTE — Progress Notes (Signed)
Pt came around after his radiation appt concerned about constipation. Gave him the "Constipation Management" handout and instructed him to increase his fluid intake as well. Pt verbalized understanding and agreement on trying OTC interventions (like Miralax and Sennakot) as well as increasing his fiber and fluid intake. Informed him that he might see a small amount of blood when he does finally have a BM, and that is normal. However, if sees copious amounts of blood and begins having active bleeding from his rectum, he needs to let us know and most likely go to the ED. Instructed the pt to let us know by Thurs/Fri if his symptoms have not improved. Pt verbalized understanding and agreement.  Also inquired about pt's PCP visit last week regarding his elevated BP. Stated he saw his PCP and that they increased his BP medication from 25 mg daily to 100 mg daily. BP was still elevated on his visit today but pt was not symptomatic.   Vitals:   05/19/18 1107  BP: (!) 180/114  Pulse: 77  Resp: 18  Temp: (!) 97.5 F (36.4 C)  TempSrc: Oral  SpO2: 100%  Weight: 188 lb 6 oz (85.4 kg)

## 2018-05-19 NOTE — Telephone Encounter (Signed)
Copied from [facility] [id number]. Topic: Quick Communication - Lab Results >> May 19, 2018  4:19 PM Noitamyae, Phetcharat, LPN wrote: Called patient to inform them of 05/19/18 lab results. When patient returns call, triage nurse may disclose results.

## 2018-05-20 ENCOUNTER — Encounter: Payer: Self-pay | Admitting: Family Medicine

## 2018-05-20 ENCOUNTER — Ambulatory Visit: Payer: Medicare HMO

## 2018-05-20 ENCOUNTER — Other Ambulatory Visit: Payer: Self-pay

## 2018-05-20 ENCOUNTER — Ambulatory Visit
Admission: RE | Admit: 2018-05-20 | Discharge: 2018-05-20 | Disposition: A | Discharge status: Home / Self Care | Payer: Medicare HMO | Source: Ambulatory Visit | Attending: Radiation Oncology | Admitting: Radiation Oncology

## 2018-05-20 DIAGNOSIS — Z510 Encounter for antineoplastic radiation therapy: Secondary | ICD-10-CM | POA: Diagnosis not present

## 2018-05-20 DIAGNOSIS — E876 Hypokalemia: Secondary | ICD-10-CM

## 2018-05-20 NOTE — Telephone Encounter (Signed)
Charted in result notes. 

## 2018-05-21 ENCOUNTER — Ambulatory Visit
Admission: RE | Admit: 2018-05-21 | Discharge: 2018-05-21 | Disposition: A | Discharge status: Home / Self Care | Payer: Medicare HMO | Source: Ambulatory Visit | Attending: Radiation Oncology | Admitting: Radiation Oncology

## 2018-05-21 ENCOUNTER — Ambulatory Visit: Payer: Medicare HMO

## 2018-05-21 DIAGNOSIS — Z510 Encounter for antineoplastic radiation therapy: Secondary | ICD-10-CM | POA: Diagnosis not present

## 2018-05-22 ENCOUNTER — Ambulatory Visit
Admission: RE | Admit: 2018-05-22 | Discharge: 2018-05-22 | Disposition: A | Discharge status: Home / Self Care | Payer: Medicare HMO | Source: Ambulatory Visit | Attending: Radiation Oncology | Admitting: Radiation Oncology

## 2018-05-22 ENCOUNTER — Ambulatory Visit: Payer: Medicare HMO

## 2018-05-22 DIAGNOSIS — Z510 Encounter for antineoplastic radiation therapy: Secondary | ICD-10-CM | POA: Diagnosis not present

## 2018-05-23 ENCOUNTER — Ambulatory Visit
Admission: RE | Admit: 2018-05-23 | Discharge: 2018-05-23 | Disposition: A | Discharge status: Home / Self Care | Payer: Medicare HMO | Source: Ambulatory Visit | Attending: Radiation Oncology | Admitting: Radiation Oncology

## 2018-05-23 ENCOUNTER — Ambulatory Visit: Payer: Self-pay | Admitting: *Deleted

## 2018-05-23 ENCOUNTER — Ambulatory Visit: Payer: Medicare HMO

## 2018-05-23 DIAGNOSIS — Z510 Encounter for antineoplastic radiation therapy: Secondary | ICD-10-CM | POA: Diagnosis not present

## 2018-05-23 NOTE — Telephone Encounter (Signed)
Pt called to say that he had feet swelling to the point where he could barely get his shoes on. He said he thinks that it is from the losartan and he was not taking it anymore. He had looked up the side effects and one was swelling. He drank apple cider vinegar and tumeric and that brought the swelling down. He last took the losartan on Tuesday.  He wanted his provider to be aware. He said that he will discuss this with his provider at his next office visit. Pt advised to call back if he starts having more symptoms. Pt voiced understanding.  Will route to [facility].   Reason for Disposition . Nursing judgment or information in reference  Answer Assessment - Initial Assessment Questions 1. LOCATION: "Which joint is swollen?"     n/a 2. ONSET: "When did the swelling start?"     n/a 3. SIZE: "How large is the swelling?"    n/a 4. PAIN: "Is there any pain?" If so, ask: "How bad is it?" (Scale 1-10; or mild, moderate, severe)     n/a 5. CAUSE: "What do you think caused the swollen joint?"     n/a 6. OTHER SYMPTOMS: "Do you have any other symptoms?" (e.g., fever, chest pain, difficulty breathing, calf pain)     n/a  Answer Assessment - Initial Assessment Questions 1. REASON FOR CALL: "What is your main concern right now?"     Feet swelling, which he thinks it is coming from taking the losartan  2. ONSET: "When did the ___ start?"     tuesday 3. SEVERITY: "How bad is the ___?"     Barely able to get his shoe on 4. FEVER: "Do you have a fever?"     no 5. OTHER SYMPTOMS: "Do you have any other new symptoms?"     Foot pain 6. INTERVENTIONS AND RESPONSE: "What have you done so far to try to make this better? What medications have you used?"     Drink apple cider vinegar and tumeric and it brought the swelling down  Protocols used: NO GUIDELINE AVAILABLE-A-AH, ANKLE SWELLING-A-AH

## 2018-05-23 NOTE — Telephone Encounter (Signed)
FYI,

## 2018-05-23 NOTE — Telephone Encounter (Signed)
Cozaar unlikely to cause swelling in lower extremities. Pt should rtc.

## 2018-05-23 NOTE — Telephone Encounter (Signed)
Appt made for 7/2 at 10am.

## 2018-05-26 ENCOUNTER — Ambulatory Visit
Admission: RE | Admit: 2018-05-26 | Discharge: 2018-05-26 | Disposition: A | Discharge status: Home / Self Care | Payer: Medicare HMO | Source: Ambulatory Visit | Attending: Radiation Oncology | Admitting: Radiation Oncology

## 2018-05-26 DIAGNOSIS — C61 Malignant neoplasm of prostate: Secondary | ICD-10-CM | POA: Diagnosis not present

## 2018-05-26 DIAGNOSIS — Z510 Encounter for antineoplastic radiation therapy: Secondary | ICD-10-CM | POA: Insufficient documentation

## 2018-05-27 ENCOUNTER — Ambulatory Visit
Admission: RE | Admit: 2018-05-27 | Discharge: 2018-05-27 | Disposition: A | Discharge status: Home / Self Care | Payer: Medicare HMO | Source: Ambulatory Visit | Attending: Radiation Oncology | Admitting: Radiation Oncology

## 2018-05-27 ENCOUNTER — Encounter: Payer: Self-pay | Admitting: Family Medicine

## 2018-05-27 ENCOUNTER — Ambulatory Visit (INDEPENDENT_AMBULATORY_CARE_PROVIDER_SITE_OTHER): Payer: Medicare HMO | Admitting: Family Medicine

## 2018-05-27 VITALS — BP 128/90 | HR 86 | Temp 97.6°F | Ht 70.0 in | Wt 191.0 lb

## 2018-05-27 DIAGNOSIS — N183 Chronic kidney disease, stage 3 unspecified: Secondary | ICD-10-CM

## 2018-05-27 DIAGNOSIS — R609 Edema, unspecified: Secondary | ICD-10-CM | POA: Diagnosis not present

## 2018-05-27 DIAGNOSIS — I10 Essential (primary) hypertension: Secondary | ICD-10-CM

## 2018-05-27 DIAGNOSIS — Z510 Encounter for antineoplastic radiation therapy: Secondary | ICD-10-CM | POA: Diagnosis not present

## 2018-05-27 NOTE — Progress Notes (Signed)
Subjective:  Patient ID: Casey Greene, male    DOB: 09-11-1951  Age: 67 y.o. MRN: [id number]  CC: Foot Swelling   HPI Casey Greene presents for evaluation and treatment of lower extremity swelling.  He has self discontinued the losartan because he felt as though it was leading to his lower extremity swelling.  He is no longer taking Lasix because he feels as though it also makes his lower extremity swelling worse.  He is currently taking a concoction with vinegar and tumor rec that he says seems to help more than anything.  He also takes a mixture of lemon juice ginger and sugar that has helped.  BMP measured 11 days ago showed a GFR of 35.69 with a potassium of 3.1.  BUN and creatinine have been consistently elevated over the last year.  Outpatient Medications Prior to Visit  Medication Sig Dispense Refill  . Calcium Carbonate-Vit D-Min (CALTRATE 600+D PLUS PO) Take by mouth.    . Glecaprevir-Pibrentasvir (MAVYRET) 100-40 MG TABS Take 3 tablets by mouth daily with breakfast. 84 tablet 2  . trospium (SANCTURA) 20 MG tablet Take 20 mg by mouth 2 (two) times daily.    . furosemide (LASIX) 20 MG tablet Take 1 tablet (20 mg total) by mouth 2 (two) times daily. 3 tablet 0  . losartan (COZAAR) 100 MG tablet Take 1 tablet (100 mg total) by mouth daily. (Patient not taking: Reported on 05/27/2018) 90 tablet 0   No facility-administered medications prior to visit.     ROS Review of Systems  Constitutional: Negative.   HENT: Negative.   Respiratory: Negative.   Cardiovascular: Negative.   Gastrointestinal: Negative.   Psychiatric/Behavioral: Negative.     Objective:  BP 128/90   Pulse 86   Temp 97.6 F (36.4 C)   Ht 5\' 10"  (1.778 m)   Wt 191 lb (86.6 kg)   SpO2 99%   BMI 27.41 kg/m   BP Readings from Last 3 Encounters:  05/27/18 128/90  05/19/18 (!) 180/114  05/16/18 (!) 142/100    Wt Readings from Last 3 Encounters:  05/27/18 191 lb (86.6 kg)  05/19/18 188 lb 6 oz (85.4 kg)    05/01/18 189 lb (85.7 kg)    Physical Exam  Constitutional: He is oriented to person, place, and time. He appears well-developed and well-nourished. No distress.  HENT:  Head: Normocephalic and atraumatic.  Right Ear: External ear normal.  Left Ear: External ear normal.  Eyes: Right eye exhibits no discharge. Left eye exhibits no discharge. No scleral icterus.  Neck: No JVD present. No tracheal deviation present.  Cardiovascular: Normal rate, regular rhythm and normal heart sounds.  Pulmonary/Chest: Effort normal and breath sounds normal.  Musculoskeletal: He exhibits edema (1 plus in lower extremities.).  Neurological: He is alert and oriented to person, place, and time.  Skin: Skin is warm and dry. He is not diaphoretic.  Psychiatric: He has a normal mood and affect. His behavior is normal.    Lab Results  Component Value Date   WBC 6.1 03/28/2018   HGB 13.6 03/28/2018   HCT 39.4 03/28/2018   PLT 158.0 03/28/2018   GLUCOSE 104 (H) 05/16/2018   CHOL 190 03/28/2018   TRIG 61.0 03/28/2018   HDL 61.90 03/28/2018   LDLCALC 116 (H) 03/28/2018   ALT 41 05/01/2018   ALT 41 05/01/2018   AST 60 (H) 05/01/2018   NA 138 05/16/2018   K 3.1 (L) 05/16/2018   CL 100 05/16/2018   CREATININE  2.34 (H) 05/16/2018   BUN 57 (H) 05/16/2018   CO2 31 05/16/2018   TSH 2.72 03/28/2018   PSA 17.81 (H) 11/29/2009   INR 1.0 05/01/2018   MICROALBUR 13.8 (H) 03/28/2018    No results found.  Assessment & Plan:   Casey Greene was seen today for foot swelling.  Diagnoses and all orders for this visit:  CKD (chronic kidney disease) stage 3, GFR 30-59 ml/min (HCC) -     Basic metabolic panel -     Ambulatory referral to Nephrology  Essential hypertension -     Basic metabolic panel -     Ambulatory referral to Nephrology  Edema, unspecified type -     Basic metabolic panel   I have discontinued Casey Greene's furosemide and losartan. I am also having him maintain his trospium, Calcium  Carbonate-Vit D-Min (CALTRATE 600+D PLUS PO), and Glecaprevir-Pibrentasvir.  No orders of the defined types were placed in this encounter.  Not certain what to do for this patient going forward.  He is reporting side effects from medicines that are not usual.  There is been decline in his renal function.  Blood pressure today is normal.  He says that he is taking no medicines for this.  There is been a decline in his renal function over the last year.  He does have pitting edema today.  BMP rechecked today.  Requesting nephrology consultation.  His wife was present in the room through speaker phone.  She agrees with the plan to send to nephrology.  Follow-up: No follow-ups on file.  Libby Maw, MD

## 2018-05-28 ENCOUNTER — Ambulatory Visit
Admission: RE | Admit: 2018-05-28 | Discharge: 2018-05-28 | Disposition: A | Discharge status: Home / Self Care | Payer: Medicare HMO | Source: Ambulatory Visit | Attending: Radiation Oncology | Admitting: Radiation Oncology

## 2018-05-28 DIAGNOSIS — Z510 Encounter for antineoplastic radiation therapy: Secondary | ICD-10-CM | POA: Diagnosis not present

## 2018-05-30 ENCOUNTER — Ambulatory Visit: Payer: Medicare HMO

## 2018-05-30 ENCOUNTER — Ambulatory Visit
Admission: RE | Admit: 2018-05-30 | Discharge: 2018-05-30 | Disposition: A | Discharge status: Home / Self Care | Payer: Medicare HMO | Source: Ambulatory Visit | Attending: Radiation Oncology | Admitting: Radiation Oncology

## 2018-05-30 DIAGNOSIS — Z510 Encounter for antineoplastic radiation therapy: Secondary | ICD-10-CM | POA: Diagnosis not present

## 2018-06-02 ENCOUNTER — Ambulatory Visit
Admission: RE | Admit: 2018-06-02 | Discharge: 2018-06-02 | Disposition: A | Discharge status: Home / Self Care | Payer: Medicare HMO | Source: Ambulatory Visit | Attending: Radiation Oncology | Admitting: Radiation Oncology

## 2018-06-02 ENCOUNTER — Ambulatory Visit: Payer: Medicare HMO

## 2018-06-02 ENCOUNTER — Encounter: Payer: Self-pay | Admitting: Radiation Oncology

## 2018-06-02 DIAGNOSIS — Z510 Encounter for antineoplastic radiation therapy: Secondary | ICD-10-CM | POA: Diagnosis not present

## 2018-06-03 ENCOUNTER — Other Ambulatory Visit (INDEPENDENT_AMBULATORY_CARE_PROVIDER_SITE_OTHER): Payer: Medicare HMO

## 2018-06-03 DIAGNOSIS — E876 Hypokalemia: Secondary | ICD-10-CM | POA: Diagnosis not present

## 2018-06-03 DIAGNOSIS — B182 Chronic viral hepatitis C: Secondary | ICD-10-CM

## 2018-06-03 LAB — POTASSIUM: Potassium: 4.1 mEq/L (ref 3.5–5.1)

## 2018-06-03 LAB — CBC WITH DIFFERENTIAL/PLATELET
BASOS PCT: 0.5 %
Basophils Absolute: 30 cells/uL (ref 0–200)
EOS PCT: 1 %
Eosinophils Absolute: 59 cells/uL (ref 15–500)
HCT: 29.5 % — ABNORMAL LOW (ref 38.5–50.0)
HEMOGLOBIN: 10.4 g/dL — AB (ref 13.2–17.1)
LYMPHS ABS: 360 {cells}/uL — AB (ref 850–3900)
MCH: 32.3 pg (ref 27.0–33.0)
MCHC: 35.3 g/dL (ref 32.0–36.0)
MCV: 91.6 fL (ref 80.0–100.0)
MONOS PCT: 10.5 %
MPV: 10.5 fL (ref 7.5–12.5)
NEUTROS ABS: 4832 {cells}/uL (ref 1500–7800)
Neutrophils Relative %: 81.9 %
Platelets: 167 10*3/uL (ref 140–400)
RBC: 3.22 10*6/uL — AB (ref 4.20–5.80)
RDW: 14 % (ref 11.0–15.0)
Total Lymphocyte: 6.1 %
WBC mixed population: 620 cells/uL (ref 200–950)
WBC: 5.9 10*3/uL (ref 3.8–10.8)

## 2018-06-09 ENCOUNTER — Ambulatory Visit: Payer: Medicare HMO

## 2018-06-10 ENCOUNTER — Ambulatory Visit (INDEPENDENT_AMBULATORY_CARE_PROVIDER_SITE_OTHER): Payer: Medicare HMO | Admitting: Pharmacist

## 2018-06-10 DIAGNOSIS — B182 Chronic viral hepatitis C: Secondary | ICD-10-CM

## 2018-06-10 DIAGNOSIS — Z23 Encounter for immunization: Secondary | ICD-10-CM | POA: Diagnosis not present

## 2018-06-10 NOTE — Progress Notes (Signed)
HPI: Casey Greene is a 67 y.o. male who presents to the [facility] for Hep C follow-up.  He has genotype 2, F3/F4 with CP class A, and started 12 weeks of Mavyret on 6/24.  Patient Active Problem List   Diagnosis Date Noted  . Mass of thyroid region 03/28/2018  . Anemia 03/28/2018  . Noncompliance by refusing intervention or support 10/22/2016  . Refusal of medication 10/22/2016  . Edema 07/09/2016  . Depression 02/27/2016  . Prostate cancer ([place]) 01/26/2016  . CKD (chronic kidney disease) stage 3, GFR 30-59 ml/min (HCC) 10/13/2014  . Chronic hepatitis C without hepatic coma ([place]) 07/05/2014  . Hyperlipidemia 06/29/2014  . Essential hypertension 06/09/2014  . Onychomycosis 12/01/2013  . Pain, foot 12/01/2013  . PVD (peripheral vascular disease) ([place]) 12/01/2013    Patient's Medications  New Prescriptions   No medications on file  Previous Medications   CALCIUM CARBONATE-VIT D-MIN (CALTRATE 600+D PLUS PO)    Take by mouth.   GLECAPREVIR-PIBRENTASVIR (MAVYRET) 100-40 MG TABS    Take 3 tablets by mouth daily with breakfast.   TROSPIUM (SANCTURA) 20 MG TABLET    Take 20 mg by mouth 2 (two) times daily.  Modified Medications   No medications on file  Discontinued Medications   No medications on file    Allergies: Allergies  Allergen Reactions  . Amlodipine Other (See Comments)    headache  . Tamsulosin Hcl     Past Medical History: Past Medical History:  Diagnosis Date  . Decreased appetite   . Foley catheter in place   . Hepatitis C   . History of gout   . Hypertension   . Prostate cancer ([place])   . Slow heartbeat   . Urinary retention     Social History: Social History   Socioeconomic History  . Marital status: Married    Spouse name: Not on file  . Number of children: Not on file  . Years of education: Not on file  . Highest education level: Not on file  Occupational History  . Not on file  Social Needs  . Financial resource strain: Not on  file  . Food insecurity:    Worry: Not on file    Inability: Not on file  . Transportation needs:    Medical: Not on file    Non-medical: Not on file  Tobacco Use  . Smoking status: Former Smoker    Packs/day: 0.50    Years: 10.00    Pack years: 5.00    Types: Cigarettes    Last attempt to quit: 01/11/2006    Years since quitting: 12.4  . Smokeless tobacco: Never Used  Substance and Sexual Activity  . Alcohol use: No  . Drug use: No  . Sexual activity: Not Currently  Lifestyle  . Physical activity:    Days per week: Not on file    Minutes per session: Not on file  . Stress: Not on file  Relationships  . Social connections:    Talks on phone: Not on file    Gets together: Not on file    Attends religious service: Not on file    Active member of club or organization: Not on file    Attends meetings of clubs or organizations: Not on file    Relationship status: Not on file  Other Topics Concern  . Not on file  Social History Narrative  . Not on file    Labs: Hepatitis C Lab Results  Component  Value Date   HCVGENOTYPE 2b 05/24/2010   HCVRNAPCRQN 3,550,000 (H) 04/11/2018   FIBROSTAGE F2 05/01/2018   Hepatitis B Lab Results  Component Value Date   HEPBSAB NEG 05/24/2010   HEPBSAG NEG 12/01/2009   HEPBCAB NEG 05/24/2010   Hepatitis A Lab Results  Component Value Date   HAV NEG 05/24/2010   HIV No results found for: HIV Lab Results  Component Value Date   CREATININE 2.34 (H) 05/16/2018   CREATININE 2.61 (H) 05/01/2018   CREATININE 2.01 (H) 03/28/2018   CREATININE 1.45 (H) 09/08/2016   CREATININE 1.50 (H) 07/08/2016   Lab Results  Component Value Date   AST 60 (H) 05/01/2018   AST 87 (H) 03/28/2018   AST 61 (H) 07/08/2016   ALT 41 05/01/2018   ALT 41 05/01/2018   ALT 68 (H) 03/28/2018   INR 1.0 05/01/2018   INR 1.02 07/02/2011   INR 1.13 05/24/2010    Fibrosis Score: F3/F4 as assessed by [facility]   Child-Pugh Score: A  Assessment: Casey Greene is  here today to follow-up for his Hepatitis C infection. He started 12 weeks of Mavyret on 6/24.  He is tolerating it well so far.  He reports being a little tired, but states that he has 2 grandchildren that he takes care of, so it is nothing new. No associated n/v/d or headaches. He states he has missed "maybe" 1-2 doses.  I asked him to keep track by punching out the days (Monday, Tuesday, etc) on the [place] box.  I also emphasized the importance of not missing any more days so that his chance of cure does not go down. He stated he wouldn't miss anymore. He works at night, so he takes all three pills together with breakfast every morning. No issues getting the medications from [facility].  He is due for his 2nd Hepatitis B vaccine. I will check a Hep C viral load, CMET, and CBC.  He will see Dr. Baxter Flattery when he has completed treatment since he has F4.   Plan: - Continue Mavyret x 12 weeks with no more missed doses - Hep C VL, CMET, and CBC today - 2nd Hep B vaccine - F/u with Dr. Baxter Flattery 10/1 at 245pm  Cassie L. Kuppelweiser, PharmD, [place], [place] [facility] 06/10/2018, 3:30 PM

## 2018-06-11 LAB — CBC
HCT: 30.2 % — ABNORMAL LOW (ref 38.5–50.0)
Hemoglobin: 10.7 g/dL — ABNORMAL LOW (ref 13.2–17.1)
MCH: 32.7 pg (ref 27.0–33.0)
MCHC: 35.4 g/dL (ref 32.0–36.0)
MCV: 92.4 fL (ref 80.0–100.0)
MPV: 10.2 fL (ref 7.5–12.5)
Platelets: 172 Thousand/uL (ref 140–400)
RBC: 3.27 Million/uL — ABNORMAL LOW (ref 4.20–5.80)
RDW: 14.1 % (ref 11.0–15.0)
WBC: 5.8 Thousand/uL (ref 3.8–10.8)

## 2018-06-11 LAB — COMPREHENSIVE METABOLIC PANEL WITH GFR
AG Ratio: 1 (calc) (ref 1.0–2.5)
ALT: 12 U/L (ref 9–46)
AST: 32 U/L (ref 10–35)
Albumin: 3.9 g/dL (ref 3.6–5.1)
Alkaline phosphatase (APISO): 47 U/L (ref 40–115)
BUN/Creatinine Ratio: 22 (calc) (ref 6–22)
BUN: 50 mg/dL — ABNORMAL HIGH (ref 7–25)
CO2: 31 mmol/L (ref 20–32)
Calcium: 9.9 mg/dL (ref 8.6–10.3)
Chloride: 101 mmol/L (ref 98–110)
Creat: 2.29 mg/dL — ABNORMAL HIGH (ref 0.70–1.25)
Globulin: 3.9 g/dL — ABNORMAL HIGH (ref 1.9–3.7)
Glucose, Bld: 98 mg/dL (ref 65–99)
Potassium: 3.7 mmol/L (ref 3.5–5.3)
Sodium: 140 mmol/L (ref 135–146)
Total Bilirubin: 0.6 mg/dL (ref 0.2–1.2)
Total Protein: 7.8 g/dL (ref 6.1–8.1)

## 2018-06-11 NOTE — Progress Notes (Signed)
°  Radiation Oncology         [phone] ________________________________  Name: Casey Greene MRN: [id number]  Date: 06/02/2018  DOB: [birth year]  End of Treatment Note  Diagnosis:   67 y.o. male with detectable, rising PSA of 21.70 s/p RALP 01/26/16 for [id number], Gleason 3+4 adenocarcinoma of the prostate    Indication for treatment:  Curative, Salvage Prostatic Fossa Radiotherapy       Radiation treatment dates:   04/07/2018 - 06/02/2018  Site/dose:   The patient received 45 Gy in 25 fractions of 1.8 Gy, followed by a boost to the prostate bed to a total dose of 68.4 Gy with 13 additional fractions of 1.8 Gy.  Beams/energy:   The prostatic fossa was treated using helical intensity modulated radiotherapy delivering 6 megavolt photons. Image guidance was performed with megavoltage CT studies prior to each fraction. He was immobilized with a body fix lower extremity mold.  Narrative: The patient tolerated radiation treatment relatively well.  He experienced some urinary side effects with nocturia, urgency, and occasional leakage but otherwise did very well with treatment and denied any significant bowel issues.  Plan: The patient has completed radiation treatment. He will return to radiation oncology clinic for routine followup in one month. I advised him to call or return sooner if he has any questions or concerns related to his recovery or treatment. ________________________________  Sheral Apley. Tammi Klippel, M.D.  This document serves as a record of services personally performed by Tyler Pita, MD. It was created on his behalf by Rae Lips, a trained medical scribe. The creation of this record is based on the scribe's personal observations and the provider's statements to them. This document has been checked and approved by the attending provider.

## 2018-06-12 LAB — HEPATITIS C RNA QUANTITATIVE
HCV Quantitative Log: 1.64 Log IU/mL — ABNORMAL HIGH
HCV RNA, PCR, QN: 44 IU/mL — ABNORMAL HIGH

## 2018-06-16 ENCOUNTER — Ambulatory Visit: Payer: Medicare HMO | Admitting: Family Medicine

## 2018-06-16 MED FILL — MAVYRET 100-40 MG TABS: 100-40 | 28 days supply | Qty: 84 | Fill #1

## 2018-06-17 ENCOUNTER — Ambulatory Visit: Payer: Medicare HMO | Admitting: Family Medicine

## 2018-06-30 ENCOUNTER — Encounter: Payer: Self-pay | Admitting: Family Medicine

## 2018-06-30 ENCOUNTER — Ambulatory Visit (INDEPENDENT_AMBULATORY_CARE_PROVIDER_SITE_OTHER): Payer: Medicare HMO | Admitting: Family Medicine

## 2018-06-30 VITALS — BP 142/110 | HR 92 | Temp 98.0°F | Ht 70.0 in | Wt 192.0 lb

## 2018-06-30 DIAGNOSIS — N183 Chronic kidney disease, stage 3 unspecified: Secondary | ICD-10-CM

## 2018-06-30 DIAGNOSIS — I129 Hypertensive chronic kidney disease with stage 1 through stage 4 chronic kidney disease, or unspecified chronic kidney disease: Secondary | ICD-10-CM | POA: Diagnosis not present

## 2018-06-30 DIAGNOSIS — D638 Anemia in other chronic diseases classified elsewhere: Secondary | ICD-10-CM | POA: Diagnosis not present

## 2018-06-30 DIAGNOSIS — Z5320 Procedure and treatment not carried out because of patient's decision for unspecified reasons: Secondary | ICD-10-CM

## 2018-06-30 DIAGNOSIS — Z5329 Procedure and treatment not carried out because of patient's decision for other reasons: Secondary | ICD-10-CM

## 2018-06-30 DIAGNOSIS — Z9119 Patient's noncompliance with other medical treatment and regimen: Secondary | ICD-10-CM

## 2018-06-30 DIAGNOSIS — Z91199 Patient's noncompliance with other medical treatment and regimen due to unspecified reason: Secondary | ICD-10-CM

## 2018-06-30 DIAGNOSIS — I10 Essential (primary) hypertension: Secondary | ICD-10-CM

## 2018-06-30 NOTE — Addendum Note (Signed)
Addended by: Lynnea Ferrier on: 06/30/2018 01:20 PM   Modules accepted: Orders

## 2018-06-30 NOTE — Patient Instructions (Signed)
Chronic Kidney Disease, Adult Chronic kidney disease (CKD) happens when the kidneys are damaged during a time of 3 or more months. The kidneys are two organs that do many important jobs in the body. These jobs include:  Removing wastes and extra fluids from the blood.  Making hormones that maintain the amount of fluid in your tissues and blood vessels.  Making sure that the body has the right amount of fluids and chemicals.  Most of the time, this condition does not go away, but it can usually be controlled. Steps must be taken to slow down the kidney damage or stop it from getting worse. Otherwise, the kidneys may stop working. Follow these instructions at home:  Follow your diet as told by your doctor. You may need to avoid alcohol, salty foods (sodium), and foods that are high in potassium, calcium, and protein.  Take over-the-counter and prescription medicines only as told by your doctor. Do not take any new medicines unless your doctor says you can do that. These include vitamins and minerals. ? Medicines and nutritional supplements can make kidney damage worse. ? Your doctor may need to change how much medicine you take.  Do not use any tobacco products. These include cigarettes, chewing tobacco, and e-cigarettes. If you need help quitting, ask your doctor.  Keep all follow-up visits as told by your doctor. This is important.  Check your blood pressure. Tell your doctor if there are changes to your blood pressure.  Get to a healthy weight. Stay at that weight. If you need help with this, ask your doctor.  Start or continue an exercise plan. Try to exercise at least 30 minutes a day, 5 days a week.  Stay up-to-date with your shots (immunizations) as told by your doctor. Contact a doctor if:  Your symptoms get worse.  You have new symptoms. Get help right away if:  You have symptoms of end-stage kidney disease. These include: ? Headaches. ? Skin that is darker or lighter  than normal. ? Numbness in your hands or feet. ? Easy bruising. ? Having hiccups often. ? Chest pain. ? Shortness of breath. ? Stopping of menstrual periods in women.  You have a fever.  You are making very little pee (urine).  You have pain or bleeding when you pee (urinate). This information is not intended to replace advice given to you by your health care provider. Make sure you discuss any questions you have with your health care provider. Document Released: 02/06/2010 Document Revised: 04/19/2016 Document Reviewed: 07/11/2012 Elsevier Interactive Patient Education  2017 [employer] for Chronic Kidney Disease When your kidneys are not working well, they cannot remove waste and excess substances from your blood as effectively as they did before. This can lead to a buildup and imbalance of these substances, which can worsen kidney damage and affect how your body functions. Certain foods lead to a buildup of these substances in the body. By changing your diet as recommended by your diet and nutrition specialist (dietitian) or health care provider, you could help prevent further kidney damage and delay or prevent the need for dialysis. What are tips for following this plan? General instructions  Work with your health care provider and dietitian to develop a meal plan that is right for you. Foods you can eat, limit, or avoid will be different for each person depending on the stage of kidney disease and any other existing health conditions.  Talk with your health care provider about whether  you should take a vitamin and mineral supplement.  Use standard measuring cups and spoons to measure servings of foods. Use a kitchen scale to measure portions of protein foods.  If directed by your health care provider, avoid drinking too much fluid. Measure and count all liquids, including water, ice, soups, flavored gelatin, and frozen desserts such as popsicles or ice cream. Reading  food labels  Check the amount of sodium in foods. Choose foods that have less than 300 milligrams (mg) per serving.  Check the ingredient list for phosphorus or potassium-based additives or preservatives.  Check the amount of saturated and trans fat. Limit or avoid these fats as told by your dietitian. Shopping  Avoid buying foods that are: ? Processed, frozen, or prepackaged. ? Calcium-enriched or fortified.  Do not buy foods that have salt or sodium listed among the first five ingredients.  Do not buy canned vegetables. Cooking  Replace animal proteins, such as meat, fish, eggs, or dairy, with plant proteins from beans, nuts, and soy. ? Use soy milk instead of cow's milk. ? Add beans or tofu to soups, casseroles, or pasta dishes instead of meat.  Soak vegetables, such as potatoes, before cooking to reduce potassium. To do this: ? Peel and cut into small pieces. ? Soak in warm water for at least 2 hours. For every 1 cup of vegetables, use 10 cups of water. ? Drain and rinse with warm water. ? Boil for at least 5 minutes. Meal planning  Limit the amount of protein from plant and animal sources you eat each day.  Do not add salt to food when cooking or before eating.  Eat meals and snacks at around the same time each day. If you have diabetes:  If you have diabetes (diabetes mellitus) and chronic kidney disease, it is important to keep your blood glucose in the target range recommended by your health care provider. Follow your diabetes management plan. This may include: ? Checking your blood glucose regularly. ? Taking oral medicines, insulin, or both. ? Exercising for at least 30 minutes on 5 or more days each week, or as told by your health care provider. ? Tracking how many servings of carbohydrates you eat at each meal.  You may be given specific guidelines on how much of certain foods and nutrients you may eat, depending on your stage of kidney disease and whether you  have high blood pressure (hypertension). Follow your meal plan as told by your dietitian. What nutrients should be limited? The items listed are not a complete list. Talk with your dietitian about what dietary choices are best for you. Potassium Potassium affects how steadily your heart beats. If too much potassium builds up in your blood, it can cause an irregular heartbeat or even a heart attack. You may need to eat less potassium, depending on your blood potassium levels and the stage of kidney disease. Talk to your dietitian about how much potassium you may have each day. You may need to limit or avoid foods that are high in potassium, such as:  Milk and soy milk.  Fruits, such as bananas, papaya, apricots, nectarines, melon, prunes, raisins, kiwi, and oranges.  Vegetables, such as potatoes, sweet potatoes, yams, tomatoes, leafy greens, beets, okra, avocado, pumpkin, and winter squash.  White and lima beans.  Phosphorus Phosphorus is a mineral found in your bones. A balance between calcium and phosphorous is needed to build and maintain healthy bones. Too much phosphorus pulls calcium from your bones. This  can make your bones weak and more likely to break. Too much phosphorus can also make your skin itch. You may need to eat less phosphorus depending on your blood phosphorus levels and the stage of kidney disease. Talk to your dietitian about how much potassium you may have each day. You may need to take medicine to lower your blood phosphorus levels if diet changes do not help. You may need to limit or avoid foods that are high in phosphorus, such as:  Milk and dairy products.  Dried beans and peas.  Tofu, soy milk, and other soy-based meat replacements.  Colas.  Nuts and peanut butter.  Meat, poultry, and fish.  Bran cereals and oatmeals.  Protein Protein helps you to make and keep muscle. It also helps in the repair of your body's cells and tissues. One of the natural  breakdown products of protein is a waste product called urea. When your kidneys are not working properly, they cannot remove wastes, such as urea, like they did before you developed chronic kidney disease. Reducing how much protein you eat can help prevent a buildup of urea in your blood. Depending on your stage of kidney disease, you may need to limit foods that are high in protein. Sources of animal protein include:  Meat (all types).  Fish and seafood.  Poultry.  Eggs.  Dairy.  Other protein foods include:  Beans and legumes.  Nuts and nut butter.  Soy and tofu.  Sodium Sodium, which is found in salt, helps maintain a healthy balance of fluids in your body. Too much sodium can increase your blood pressure and have a negative effect on the function of your heart and lungs. Too much sodium can also cause your body to retain too much fluid, making your kidneys work harder. Most people should have less than 2,300 milligrams (mg) of sodium each day. If you have hypertension, you may need to limit your sodium to 1,500 mg each day. Talk to your dietitian about how much sodium you may have each day. You may need to limit or avoid foods that are high in sodium, such as:  Salt seasonings.  Soy sauce.  Cured and processed meats.  Salted crackers and snack foods.  Fast food.  Canned soups and most canned foods.  Pickled foods.  Vegetable juice.  Boxed mixes or ready-to-eat boxed meals and side dishes.  Bottled dressings, sauces, and marinades.  Summary  Chronic kidney disease can lead to a buildup and imbalance of waste and excess substances in the body. Certain foods lead to a buildup of these substances. By adjusting your intake of these foods, you could help prevent more kidney damage and delay or prevent the need for dialysis.  Food adjustments are different for each person with chronic kidney disease. Work with a dietitian to set up nutrient goals and a meal plan that is  right for you.  If you have diabetes and chronic kidney disease, it is important to keep your blood glucose in the target range recommended by your health care provider. This information is not intended to replace advice given to you by your health care provider. Make sure you discuss any questions you have with your health care provider. Document Released: 02/02/2003 Document Revised: 11/07/2016 Document Reviewed: 11/07/2016 Elsevier Interactive Patient Education  [employer].

## 2018-06-30 NOTE — Progress Notes (Signed)
Subjective:  Patient ID: Casey Greene, male    DOB: [birth year]  Age: 67 y.o. MRN: [id number]  CC: Follow-up (Pt here f/u for hypertension and edema , pt bp this morning was 170/115 , pt sts edema has improved in his lower extremity)   HPI Casey Greene presents for follow-up of his hypertension and chronic kidney disease.  He continues to treat himself with homeopathic remedies.  He tells me that his blood pressure has been running higher at home through his monitor.  He says that he never heard about a referral to the nephrologist.  I asked our scheduler to check on that.  Nephrology has documented that they called him on 19 July and asked him to return the call.  He says that he did not receive a phone call.  Outpatient Medications Prior to Visit  Medication Sig Dispense Refill  . Calcium Carbonate-Vit D-Min (CALTRATE 600+D PLUS PO) Take by mouth.    . trospium (SANCTURA) 20 MG tablet Take 20 mg by mouth 2 (two) times daily.    Marland Kitchen Glecaprevir-Pibrentasvir (MAVYRET) 100-40 MG TABS Take 3 tablets by mouth daily with breakfast. (Patient not taking: Reported on 06/30/2018) 84 tablet 2   No facility-administered medications prior to visit.     ROS Review of Systems  Constitutional: Negative for chills, fever and unexpected weight change.  Respiratory: Negative.   Cardiovascular: Negative.   Gastrointestinal: Negative.  Negative for anal bleeding and blood in stool.  Genitourinary: Negative for decreased urine volume.  Neurological: Negative for headaches.  Hematological: Does not bruise/bleed easily.  Psychiatric/Behavioral: Negative.     Objective:  BP (!) 142/110   Pulse 92   Temp 98 F (36.7 C) (Oral)   Ht 5\' 10"  (1.778 m)   Wt 192 lb (87.1 kg)   SpO2 98%   BMI 27.55 kg/m   BP Readings from Last 3 Encounters:  06/30/18 (!) 142/110  05/27/18 128/90  05/19/18 (!) 180/114    Wt Readings from Last 3 Encounters:  06/30/18 192 lb (87.1 kg)  05/27/18 191 lb (86.6 kg)    05/19/18 188 lb 6 oz (85.4 kg)    Physical Exam  Constitutional: He is oriented to person, place, and time. He appears well-developed and well-nourished. No distress.  HENT:  Head: Normocephalic and atraumatic.  Right Ear: External ear normal.  Eyes: Right eye exhibits no discharge. Left eye exhibits no discharge.  Cardiovascular: Normal rate, regular rhythm and normal heart sounds.  Pulmonary/Chest: Effort normal and breath sounds normal.  Musculoskeletal: He exhibits edema (trace).  Neurological: He is alert and oriented to person, place, and time.  Skin: Skin is warm and dry. He is not diaphoretic.    Lab Results  Component Value Date   WBC 5.8 06/10/2018   HGB 10.7 (L) 06/10/2018   HCT 30.2 (L) 06/10/2018   PLT 172 06/10/2018   GLUCOSE 98 06/10/2018   CHOL 190 03/28/2018   TRIG 61.0 03/28/2018   HDL 61.90 03/28/2018   LDLCALC 116 (H) 03/28/2018   ALT 12 06/10/2018   AST 32 06/10/2018   NA 140 06/10/2018   K 3.7 06/10/2018   CL 101 06/10/2018   CREATININE 2.29 (H) 06/10/2018   BUN 50 (H) 06/10/2018   CO2 31 06/10/2018   TSH 2.72 03/28/2018   PSA 17.81 (H) 11/29/2009   INR 1.0 05/01/2018   MICROALBUR 13.8 (H) 03/28/2018    No results found.  Assessment & Plan:   Casey Greene was seen today for follow-up.  Diagnoses and all orders for this visit:  Refusal of medication  Essential hypertension -     Basic metabolic panel -     Cancel: Ambulatory referral to Nephrology -     Ambulatory referral to Nephrology  CKD (chronic kidney disease) stage 3, GFR 30-59 ml/min ([place]) -     Basic metabolic panel -     Cancel: Ambulatory referral to Nephrology -     Ambulatory referral to Nephrology  Noncompliance by refusing intervention or support  Anemia of chronic disease -     Retic -     POC Hemoccult Bld/Stl (3-Cd Home Screen); Future   I am having Casey Greene maintain his trospium, Calcium Carbonate-Vit D-Min (CALTRATE 600+D PLUS PO), and  Glecaprevir-Pibrentasvir.  No orders of the defined types were placed in this encounter.  Have asked for another referral to nephrology.  As patient to return in 1 month with his blood pressure cuff.  Patient is aware that dialysis may become a reality if his kidney function continues on its downward decline.  Offered alternative medicines to the losartan and Lasix but he is electing not to take any prescription medicines at this time.  He feels as though he can control his blood pressure on his own but does agree for follow-up.  Follow-up: Return in about 1 month (around 07/28/2018).  Libby Maw, MD

## 2018-07-01 ENCOUNTER — Other Ambulatory Visit: Payer: Medicare HMO

## 2018-07-03 ENCOUNTER — Other Ambulatory Visit: Payer: Self-pay

## 2018-07-03 ENCOUNTER — Encounter: Payer: Self-pay | Admitting: Urology

## 2018-07-03 ENCOUNTER — Ambulatory Visit
Admission: RE | Admit: 2018-07-03 | Discharge: 2018-07-03 | Disposition: A | Discharge status: Home / Self Care | Payer: Medicare HMO | Source: Ambulatory Visit | Attending: Urology | Admitting: Urology

## 2018-07-03 VITALS — BP 146/100 | HR 93 | Temp 97.6°F | Resp 20 | Ht 70.0 in | Wt 191.0 lb

## 2018-07-03 DIAGNOSIS — C61 Malignant neoplasm of prostate: Secondary | ICD-10-CM

## 2018-07-03 DIAGNOSIS — Z888 Allergy status to other drugs, medicaments and biological substances status: Secondary | ICD-10-CM | POA: Diagnosis not present

## 2018-07-03 NOTE — Addendum Note (Signed)
Encounter addended by: Malena Edman, RN on: 07/03/2018 2:00 PM  Actions taken: Problem List reviewed, Allergies reviewed, Visit diagnoses modified, Medication List reviewed

## 2018-07-03 NOTE — Progress Notes (Signed)
Radiation Oncology         [phone] ________________________________  Name: Debra Calabretta MRN: [id number]  Date: 07/03/2018  DOB: 04/07/1951  Post Treatment Note  CC: Libby Maw, MD  Festus Aloe, MD  Diagnosis:   67 y.o. male with detectable, rising PSA of 21.70 s/p RALP 01/26/16 for pT3aN1Mx, Gleason 3+4 adenocarcinoma of the prostate    Interval Since Last Radiation:  4 weeks  04/07/2018 - 06/02/2018:   The patient received 45 Gy in 25 fractions of 1.8 Gy, followed by a boost to the prostate bed to a total dose of 68.4 Gy with 13 additional fractions of 1.8 Gy.  Narrative:  The patient returns today for routine follow-up.  He tolerated radiation treatment relatively well.  He experienced some urinary side effects with nocturia, urgency, and occasional leakage but otherwise did very well with treatment and denied any significant bowel issues.  He remained on ADT throughout his course of treatment, Lupron was started 02/10/2018.                            On review of systems, the patient states that he is doing very well overall.  He continues to notice improvement in his lower urinary tract symptoms with a current IPSS score of 10, indicating mild to moderate urinary symptoms.  At this point, his most bothersome symptom is continued urgency with occasional urge incontinence if he postpones urination too long but otherwise denies dysuria, gross hematuria, excessive daytime frequency, weak stream, straining to void or feelings of incomplete emptying.  He continues with mild fatigue and occasional hot flashes associated with his ADT but overall feels that he tolerates this treatment well.  ALLERGIES:  is allergic to amlodipine and tamsulosin hcl.  Meds: Current Outpatient Medications  Medication Sig Dispense Refill  . Glecaprevir-Pibrentasvir (MAVYRET) 100-40 MG TABS Take 3 tablets by mouth daily with breakfast. 84 tablet 2  . trospium (SANCTURA) 20 MG tablet Take 20 mg by  mouth 2 (two) times daily.    . Calcium Carbonate-Vit D-Min (CALTRATE 600+D PLUS PO) Take by mouth.     No current facility-administered medications for this encounter.     Physical Findings:  height is 5\' 10"  (1.778 m) and weight is 191 lb (86.6 kg). His oral temperature is 97.6 F (36.4 C). His blood pressure is 146/100 (abnormal) and his pulse is 93. His respiration is 20.  Pain Assessment Pain Score: 0-No pain/10 In general this is a well appearing African-American male in no acute distress.  He's alert and oriented x4 and appropriate throughout the examination. Cardiopulmonary assessment is negative for acute distress and he exhibits normal effort.   Lab Findings: Lab Results  Component Value Date   WBC 5.8 06/10/2018   HGB 10.7 (L) 06/10/2018   HCT 30.2 (L) 06/10/2018   MCV 92.4 06/10/2018   PLT 172 06/10/2018     Radiographic Findings: No results found.  Impression/Plan: 1. 68 y.o. male with detectable, rising PSA of 21.70 s/p RALP 01/26/16 for pT3aN1Mx, Gleason 3+4 adenocarcinoma of the prostate.   He will continue to follow up with urology for ongoing PSA determinations.  He is unsure whether he has an appointment scheduled with Dr. Junious Silk for follow up.  He plans to complete a full 2 year course of ADT.  He understands what to expect with regards to PSA monitoring going forward. I will look forward to following his response to treatment via correspondence  with urology, and would be happy to continue to participate in his care if clinically indicated. I talked to the patient about what to expect in the future, including his risk for erectile dysfunction and rectal bleeding. I encouraged him to call or return to the office if he has any questions regarding his previous radiation or possible radiation side effects. He was comfortable with this plan and will follow up as needed.    Nicholos Johns, [name]

## 2018-07-06 ENCOUNTER — Other Ambulatory Visit: Payer: Self-pay | Admitting: Family Medicine

## 2018-07-06 DIAGNOSIS — I739 Peripheral vascular disease, unspecified: Secondary | ICD-10-CM

## 2018-07-06 DIAGNOSIS — I10 Essential (primary) hypertension: Secondary | ICD-10-CM

## 2018-07-06 DIAGNOSIS — N183 Chronic kidney disease, stage 3 unspecified: Secondary | ICD-10-CM

## 2018-07-14 MED FILL — MAVYRET 100-40 MG TABS: 100-40 | 28 days supply | Qty: 84 | Fill #2

## 2018-08-04 ENCOUNTER — Ambulatory Visit: Payer: Medicare HMO | Admitting: Internal Medicine

## 2018-08-25 ENCOUNTER — Telehealth: Payer: Self-pay

## 2018-08-25 ENCOUNTER — Ambulatory Visit: Payer: Medicare HMO

## 2018-08-25 NOTE — Telephone Encounter (Signed)
Patient called today to inform Dr. Baxter Flattery he has finished his Hep C medication and would like to schedule a follow-up appointment. Patient already has an appointment to see Dr. Baxter Flattery in November. Informed patient of this appointment. Patient does not want to reschedule and will call office if he needs to be seen before current scheduled appointment. Casey Greene

## 2018-08-26 ENCOUNTER — Ambulatory Visit: Payer: Medicare HMO | Admitting: Internal Medicine

## 2018-09-02 ENCOUNTER — Ambulatory Visit: Payer: Medicare HMO | Admitting: Internal Medicine

## 2018-10-01 ENCOUNTER — Ambulatory Visit: Payer: Medicare HMO | Admitting: Internal Medicine

## 2018-10-08 ENCOUNTER — Ambulatory Visit (INDEPENDENT_AMBULATORY_CARE_PROVIDER_SITE_OTHER): Payer: Medicare HMO | Admitting: Internal Medicine

## 2018-10-08 ENCOUNTER — Encounter: Payer: Self-pay | Admitting: Internal Medicine

## 2018-10-08 VITALS — HR 69 | Temp 97.5°F | Ht 69.0 in | Wt 196.0 lb

## 2018-10-08 DIAGNOSIS — B182 Chronic viral hepatitis C: Secondary | ICD-10-CM

## 2018-10-08 NOTE — Progress Notes (Signed)
RFV: follow up for hep C  Patient ID: Casey Greene, male   DOB: 1950/12/11, 67 y.o.   MRN: [id number]  HPI  Casey Greene is a 67 y.o. male who presents to the [facility] for Hep C follow-up.  He has genotype 2, F3/F4 with CP class A, and started 12 weeks of Mavyret on 6/24 which he finished at end of September. Doing well otherwise. Outpatient Encounter Medications as of 10/08/2018  Medication Sig  . Calcium Carbonate-Vit D-Min (CALTRATE 600+D PLUS PO) Take by mouth.  . trospium (SANCTURA) 20 MG tablet Take 20 mg by mouth 2 (two) times daily.  Marland Kitchen Glecaprevir-Pibrentasvir (MAVYRET) 100-40 MG TABS Take 3 tablets by mouth daily with breakfast. (Patient not taking: Reported on 10/08/2018)   No facility-administered encounter medications on file as of 10/08/2018.      Patient Active Problem List   Diagnosis Date Noted  . Mass of thyroid region 03/28/2018  . Anemia of chronic disease 03/28/2018  . Noncompliance by refusing intervention or support 10/22/2016  . Refusal of medication 10/22/2016  . Edema 07/09/2016  . Depression 02/27/2016  . Prostate cancer ([place]) 01/26/2016  . CKD (chronic kidney disease) stage 3, GFR 30-59 ml/min ([facility]) 10/13/2014  . Chronic hepatitis C without hepatic coma ([place]) 07/05/2014  . Hyperlipidemia 06/29/2014  . Essential hypertension 06/09/2014  . Onychomycosis 12/01/2013  . Pain, foot 12/01/2013  . PVD (peripheral vascular disease) ([place]) 12/01/2013     Health Maintenance Due  Topic Date Due  . TETANUS/TDAP  01/09/1968  . COLONOSCOPY  01/08/1999  . PNA vac Low Risk Adult (1 of 2 - PCV13) 01/08/2014  . INFLUENZA VACCINE  06/26/2018     Review of Systems Review of Systems  Constitutional: Negative for fever, chills, diaphoresis, activity change, appetite change, fatigue and unexpected weight change.  HENT: Negative for congestion, sore throat, rhinorrhea, sneezing, trouble swallowing and sinus pressure.  Eyes: Negative for photophobia and visual  disturbance.  Respiratory: Negative for cough, chest tightness, shortness of breath, wheezing and stridor.  Cardiovascular: Negative for chest pain, palpitations and leg swelling.  Gastrointestinal: Negative for nausea, vomiting, abdominal pain, diarrhea, constipation, blood in stool, abdominal distention and anal bleeding.  Genitourinary: Negative for dysuria, hematuria, flank pain and difficulty urinating.  Musculoskeletal: Negative for myalgias, back pain, joint swelling, arthralgias and gait problem.  Skin: Negative for color change, pallor, rash and wound.  Neurological: Negative for dizziness, tremors, weakness and light-headedness.  Hematological: Negative for adenopathy. Does not bruise/bleed easily.  Psychiatric/Behavioral: Negative for behavioral problems, confusion, sleep disturbance, dysphoric mood, decreased concentration and agitation.   Social History   Tobacco Use  . Smoking status: Former Smoker    Packs/day: 0.50    Years: 10.00    Pack years: 5.00    Types: Cigarettes    Last attempt to quit: 01/11/2006    Years since quitting: 12.7  . Smokeless tobacco: Never Used  Substance Use Topics  . Alcohol use: No  . Drug use: No    Physical Exam   Pulse 69   Temp (!) 97.5 F (36.4 C)   Ht 5\' 9"  (1.753 m)   Wt 196 lb (88.9 kg)   BMI 28.94 kg/m   Physical Exam  Constitutional: He is oriented to person, place, and time. He appears well-developed and well-nourished. No distress.  HENT:  Mouth/Throat: Oropharynx is clear and moist. No oropharyngeal exudate.  Cardiovascular: Normal rate, regular rhythm and normal heart sounds. Exam reveals no gallop and no friction rub.  No  murmur heard.  Pulmonary/Chest: Effort normal and breath sounds normal. No respiratory distress. He has no wheezes.  Abdominal: Soft. Bowel sounds are normal. He exhibits no distension. There is no tenderness.  Lymphadenopathy:  He has no cervical adenopathy.  Neurological: He is alert and  oriented to person, place, and time.  Skin: Skin is warm and dry. No rash noted. No erythema.  Psychiatric: He has a normal mood and affect. His behavior is normal.    Lab Results  Component Value Date   HEPBSAB NEG 05/24/2010   Lab Results  Component Value Date   LABRPR NON REAC 09/06/2008    CBC Lab Results  Component Value Date   WBC 5.8 06/10/2018   RBC 3.27 (L) 06/10/2018   HGB 10.7 (L) 06/10/2018   HCT 30.2 (L) 06/10/2018   PLT 172 06/10/2018   MCV 92.4 06/10/2018   MCH 32.7 06/10/2018   MCHC 35.4 06/10/2018   RDW 14.1 06/10/2018   LYMPHSABS 360 (L) 06/03/2018   MONOABS 0.4 09/08/2016   EOSABS 59 06/03/2018    BMET Lab Results  Component Value Date   NA 140 06/10/2018   K 3.7 06/10/2018   CL 101 06/10/2018   CO2 31 06/10/2018   GLUCOSE 98 06/10/2018   BUN 50 (H) 06/10/2018   CREATININE 2.29 (H) 06/10/2018   CALCIUM 9.9 06/10/2018   GFRNONAA 24 (L) 05/01/2018   GFRAA 28 (L) 05/01/2018      Assessment and Plan  Will get hep C VL today  Plus will have appt in end December for SVR 12 plus last dose of hep B In mid December, we will do liver MRI for [facility] surveillance

## 2018-10-08 NOTE — Patient Instructions (Signed)
Follow up with ID pharmacist late December/early January for last blood work and last dose hep B #2

## 2018-10-10 LAB — HEPATITIS C RNA QUANTITATIVE
HCV Quantitative Log: <1.18 Log IU/mL
HCV RNA, PCR, QN: NOT DETECTED [IU]/mL

## 2018-11-11 ENCOUNTER — Ambulatory Visit
Admission: RE | Admit: 2018-11-11 | Discharge: 2018-11-11 | Disposition: A | Discharge status: Home / Self Care | Payer: Medicare HMO | Source: Ambulatory Visit | Attending: Internal Medicine | Admitting: Internal Medicine

## 2018-11-11 DIAGNOSIS — B182 Chronic viral hepatitis C: Secondary | ICD-10-CM

## 2018-12-01 ENCOUNTER — Ambulatory Visit (INDEPENDENT_AMBULATORY_CARE_PROVIDER_SITE_OTHER): Payer: Medicare Other | Admitting: Family Medicine

## 2018-12-01 ENCOUNTER — Ambulatory Visit: Payer: Medicare HMO | Admitting: Internal Medicine

## 2018-12-01 ENCOUNTER — Encounter: Payer: Self-pay | Admitting: Family Medicine

## 2018-12-01 VITALS — BP 160/98 | HR 71 | Temp 98.3°F | Ht 69.0 in | Wt 201.1 lb

## 2018-12-01 DIAGNOSIS — B182 Chronic viral hepatitis C: Secondary | ICD-10-CM

## 2018-12-01 DIAGNOSIS — Z5329 Procedure and treatment not carried out because of patient's decision for other reasons: Secondary | ICD-10-CM

## 2018-12-01 DIAGNOSIS — I10 Essential (primary) hypertension: Secondary | ICD-10-CM

## 2018-12-01 DIAGNOSIS — N183 Chronic kidney disease, stage 3 unspecified: Secondary | ICD-10-CM

## 2018-12-01 DIAGNOSIS — Z91199 Patient's noncompliance with other medical treatment and regimen due to unspecified reason: Secondary | ICD-10-CM

## 2018-12-01 DIAGNOSIS — K5900 Constipation, unspecified: Secondary | ICD-10-CM

## 2018-12-01 DIAGNOSIS — D638 Anemia in other chronic diseases classified elsewhere: Secondary | ICD-10-CM

## 2018-12-01 HISTORY — DX: Constipation, unspecified: K59.00

## 2018-12-01 MED ORDER — DOCUSATE SODIUM 100 MG PO CAPS: 100.0000 mg | ORAL_CAPSULE | Freq: Two times a day (BID) | ORAL | 0 refills | Status: DC

## 2018-12-01 MED ORDER — POLYETHYLENE GLYCOL 3350 17 GM/SCOOP PO POWD: 17.0000 g | Freq: Once | ORAL | 1 refills | Status: AC

## 2018-12-01 NOTE — Progress Notes (Signed)
Established Patient Office Visit  Subjective:  Patient ID: Casey Greene, male    DOB: 03-Jul-1951  Age: 68 y.o. MRN: [id number]  CC:  Chief Complaint  Patient presents with  . upset stomach    x 1 week, no vomiting/nausea. feels like food is not digesting right    HPI Casey Greene presents for treatment and evaluation of a 1 to 2-week history of constipation.  Stools are hard and small.  Patient has to force himself to evacuate his bowels.  Patient has tried Pepto-Bismol and this seemed to turn his stools dark.  There is been no bleeding per rectum.  Patient has discomfort across his lower abdomen when stooling.  Denies nausea vomiting fever chills reflux symptoms or upper abdominal pain.  Patient is undergoing treatment for his hep C with Mavyret.  Predominant side effects listed with a stronger nausea, vomiting and diarrhea.  Patient continues treatment by oncology for his prostate cancer.  He has a follow-up with them next week.  Patient does not smoke drink alcohol.  He is using marijuana to treat his high blood pressure.  Past Medical History:  Diagnosis Date  . Decreased appetite   . Foley catheter in place   . Hepatitis C   . History of gout   . Hypertension   . Prostate cancer ([place])   . Slow heartbeat   . Urinary retention     Past Surgical History:  Procedure Laterality Date  . ACHILLES TENDON REPAIR    . CERVICAL DISC SURGERY     neck  . LYMPHADENECTOMY Bilateral 01/26/2016   Procedure: EXTENDED BILATERAL PELVIC LYMPHADENECTOMY;  Surgeon: Raynelle Bring, MD;  Location: [facility];  Service: Urology;  Laterality: Bilateral;  . NOSE SURGERY     in high school-nasal fracturex2  . ROBOT ASSISTED LAPAROSCOPIC RADICAL PROSTATECTOMY N/A 01/26/2016   Procedure: XI ROBOTIC ASSISTED LAPAROSCOPIC RADICAL PROSTATECTOMY LEVEL 3;  Surgeon: Raynelle Bring, MD;  Location: [facility];  Service: Urology;  Laterality: N/A;    Family History  Problem Relation Age of Onset  . Heart disease Mother    . Obesity Mother   . Cervical cancer Mother   . Heart disease Father   . Diabetes Father   . ALS Sister   . Colon cancer Neg Hx     Social History   Socioeconomic History  . Marital status: Married    Spouse name: Not on file  . Number of children: Not on file  . Years of education: Not on file  . Highest education level: Not on file  Occupational History  . Not on file  Social Needs  . Financial resource strain: Not on file  . Food insecurity:    Worry: Not on file    Inability: Not on file  . Transportation needs:    Medical: Not on file    Non-medical: Not on file  Tobacco Use  . Smoking status: Former Smoker    Packs/day: 0.50    Years: 10.00    Pack years: 5.00    Types: Cigarettes    Last attempt to quit: 01/11/2006    Years since quitting: 12.8  . Smokeless tobacco: Never Used  Substance and Sexual Activity  . Alcohol use: No  . Drug use: No  . Sexual activity: Not Currently  Lifestyle  . Physical activity:    Days per week: Not on file    Minutes per session: Not on file  . Stress: Not on file  Relationships  .  Social connections:    Talks on phone: Not on file    Gets together: Not on file    Attends religious service: Not on file    Active member of club or organization: Not on file    Attends meetings of clubs or organizations: Not on file    Relationship status: Not on file  . Intimate partner violence:    Fear of current or ex partner: Not on file    Emotionally abused: Not on file    Physically abused: Not on file    Forced sexual activity: Not on file  Other Topics Concern  . Not on file  Social History Narrative  . Not on file    Outpatient Medications Prior to Visit  Medication Sig Dispense Refill  . Calcium Carbonate-Vit D-Min (CALTRATE 600+D PLUS PO) Take by mouth.    . Glecaprevir-Pibrentasvir (MAVYRET) 100-40 MG TABS Take 3 tablets by mouth daily with breakfast. 84 tablet 2  . trospium (SANCTURA) 20 MG tablet Take 20 mg by  mouth 2 (two) times daily.     No facility-administered medications prior to visit.     Allergies  Allergen Reactions  . Amlodipine Other (See Comments)    headache  . Tamsulosin Hcl     ROS Review of Systems  Constitutional: Negative for chills, diaphoresis, fatigue, fever and unexpected weight change.  HENT: Negative.   Eyes: Negative for photophobia and visual disturbance.  Respiratory: Negative for cough and shortness of breath.   Cardiovascular: Negative for chest pain and palpitations.  Gastrointestinal: Positive for abdominal distention, abdominal pain and constipation. Negative for anal bleeding, blood in stool, diarrhea, nausea, rectal pain and vomiting.  Skin: Negative for color change and rash.  Allergic/Immunologic: Positive for immunocompromised state.  Neurological: Negative for light-headedness and headaches.  Hematological: Does not bruise/bleed easily.  Psychiatric/Behavioral: Negative.       Objective:    Physical Exam  Constitutional: He is oriented to person, place, and time. He appears well-developed and well-nourished. No distress.  HENT:  Head: Normocephalic and atraumatic.  Right Ear: External ear normal.  Left Ear: External ear normal.  Mouth/Throat: Oropharynx is clear and moist. No oropharyngeal exudate.  Eyes: Pupils are equal, round, and reactive to light. Conjunctivae are normal. Right eye exhibits no discharge. Left eye exhibits no discharge. No scleral icterus.  Neck: No JVD present. No tracheal deviation present. No thyromegaly present.  Cardiovascular: Normal rate, regular rhythm and normal heart sounds.  Pulmonary/Chest: Effort normal and breath sounds normal. No stridor.  Abdominal: He exhibits distension. There is no abdominal tenderness. There is no rebound and no guarding. A hernia is present. Hernia confirmed positive in the ventral area.  Genitourinary: Rectum:     Guaiac result negative.     No rectal mass, anal fissure,  tenderness, external hemorrhoid, internal hemorrhoid or abnormal anal tone.   Neurological: He is alert and oriented to person, place, and time.  Skin: Skin is warm and dry. He is not diaphoretic.  Psychiatric: He has a normal mood and affect. His behavior is normal.    BP (!) 160/98   Pulse 71   Temp 98.3 F (36.8 C) (Oral)   Ht 5\' 9"  (1.753 m)   Wt 201 lb 2 oz (91.2 kg)   SpO2 99%   BMI 29.70 kg/m  Wt Readings from Last 3 Encounters:  12/01/18 201 lb 2 oz (91.2 kg)  10/08/18 196 lb (88.9 kg)  07/03/18 191 lb (86.6 kg)   BP  Readings from Last 3 Encounters:  12/01/18 (!) 160/98  07/03/18 (!) 146/100  06/30/18 (!) 142/110   Guideline developer:  UpToDate (see UpToDate for funding source) Date Released: June 2014  Health Maintenance Due  Topic Date Due  . Samul Dada  01/09/1968  . COLONOSCOPY  01/08/1999  . PNA vac Low Risk Adult (1 of 2 - PCV13) 01/08/2014  . INFLUENZA VACCINE  06/26/2018    There are no preventive care reminders to display for this patient.  Lab Results  Component Value Date   TSH 2.72 03/28/2018   Lab Results  Component Value Date   WBC 5.8 06/10/2018   HGB 10.7 (L) 06/10/2018   HCT 30.2 (L) 06/10/2018   MCV 92.4 06/10/2018   PLT 172 06/10/2018   Lab Results  Component Value Date   NA 140 06/10/2018   K 3.7 06/10/2018   CO2 31 06/10/2018   GLUCOSE 98 06/10/2018   BUN 50 (H) 06/10/2018   CREATININE 2.29 (H) 06/10/2018   BILITOT 0.6 06/10/2018   ALKPHOS 54 03/28/2018   AST 32 06/10/2018   ALT 12 06/10/2018   PROT 7.8 06/10/2018   ALBUMIN 4.0 03/28/2018   CALCIUM 9.9 06/10/2018   ANIONGAP 8 09/08/2016   GFR 35.69 (L) 05/16/2018   Lab Results  Component Value Date   CHOL 190 03/28/2018   Lab Results  Component Value Date   HDL 61.90 03/28/2018   Lab Results  Component Value Date   LDLCALC 116 (H) 03/28/2018   Lab Results  Component Value Date   TRIG 61.0 03/28/2018   Lab Results  Component Value Date   CHOLHDL 3  03/28/2018   No results found for: HGBA1C    Assessment & Plan:   Problem List Items Addressed This Visit      Cardiovascular and Mediastinum   Essential hypertension - Primary   Relevant Orders   Basic metabolic panel     Digestive   Chronic hepatitis C without hepatic coma ([facility])     Genitourinary   CKD (chronic kidney disease) stage 3, GFR 30-59 ml/min ([facility])   Relevant Orders   Basic metabolic panel     Other   Noncompliance by refusing intervention or support   Anemia of chronic disease   Relevant Orders   CBC   Constipation   Relevant Medications   polyethylene glycol powder (GLYCOLAX/MIRALAX) powder   docusate sodium (COLACE) 100 MG capsule   Other Relevant Orders   CBC      Meds ordered this encounter  Medications  . polyethylene glycol powder (GLYCOLAX/MIRALAX) powder    Sig: Take 17 g by mouth once for 1 dose. Mix with water or juice.    Dispense:  3350 g    Refill:  1  . docusate sodium (COLACE) 100 MG capsule    Sig: Take 1 capsule (100 mg total) by mouth 2 (two) times daily.    Dispense:  100 capsule    Refill:  0    Follow-up: Return in about 2 weeks (around 12/15/2018).   Patient will try Colace and MiraLAX for his constipation.  Recommended treatment for his high blood pressure patient declines.  He would like to use marijuana.  I reminded him that marijuana has not been used for this purpose.  He said that a couple of his brothers had taken it before for their blood pressure and it helped.  Progress my concern about his declining renal function and that his high blood pressure probably related to that  problem.

## 2018-12-01 NOTE — Patient Instructions (Signed)

## 2018-12-02 LAB — BASIC METABOLIC PANEL
BUN: 35 mg/dL — AB (ref 6–23)
CHLORIDE: 100 meq/L (ref 96–112)
CO2: 34 mEq/L — ABNORMAL HIGH (ref 19–32)
CREATININE: 1.85 mg/dL — AB (ref 0.40–1.50)
Calcium: 9.9 mg/dL (ref 8.4–10.5)
GFR: 46.73 mL/min — ABNORMAL LOW (ref >60.00)
GLUCOSE: 101 mg/dL — AB (ref 70–99)
POTASSIUM: 3.2 meq/L — AB (ref 3.5–5.1)
Sodium: 141 mEq/L (ref 135–145)

## 2018-12-02 LAB — CBC
HCT: 33.6 % — ABNORMAL LOW (ref 39.0–52.0)
HEMOGLOBIN: 11.6 g/dL — AB (ref 13.0–17.0)
MCHC: 34.5 g/dL (ref 30.0–36.0)
MCV: 92 fl (ref 78.0–100.0)
PLATELETS: 168 10*3/uL (ref 150.0–400.0)
RBC: 3.65 Mil/uL — AB (ref 4.22–5.81)
RDW: 13.5 % (ref 11.5–15.5)
WBC: 5.9 10*3/uL (ref 4.0–10.5)

## 2018-12-03 ENCOUNTER — Other Ambulatory Visit: Payer: Self-pay

## 2018-12-03 DIAGNOSIS — E876 Hypokalemia: Secondary | ICD-10-CM

## 2018-12-08 ENCOUNTER — Ambulatory Visit: Payer: Medicare HMO | Admitting: Internal Medicine

## 2018-12-09 ENCOUNTER — Ambulatory Visit: Payer: Self-pay

## 2018-12-09 ENCOUNTER — Ambulatory Visit (INDEPENDENT_AMBULATORY_CARE_PROVIDER_SITE_OTHER): Payer: Medicare Other | Admitting: Family Medicine

## 2018-12-09 ENCOUNTER — Encounter: Payer: Self-pay | Admitting: Family Medicine

## 2018-12-09 VITALS — BP 128/80 | HR 80 | Temp 97.9°F | Ht 69.0 in | Wt 201.0 lb

## 2018-12-09 DIAGNOSIS — Z91199 Patient's noncompliance with other medical treatment and regimen due to unspecified reason: Secondary | ICD-10-CM

## 2018-12-09 DIAGNOSIS — E876 Hypokalemia: Secondary | ICD-10-CM

## 2018-12-09 DIAGNOSIS — Z1211 Encounter for screening for malignant neoplasm of colon: Secondary | ICD-10-CM | POA: Insufficient documentation

## 2018-12-09 DIAGNOSIS — Z5329 Procedure and treatment not carried out because of patient's decision for other reasons: Secondary | ICD-10-CM

## 2018-12-09 DIAGNOSIS — Z5320 Procedure and treatment not carried out because of patient's decision for unspecified reasons: Secondary | ICD-10-CM | POA: Diagnosis not present

## 2018-12-09 DIAGNOSIS — K625 Hemorrhage of anus and rectum: Secondary | ICD-10-CM | POA: Insufficient documentation

## 2018-12-09 DIAGNOSIS — K5900 Constipation, unspecified: Secondary | ICD-10-CM | POA: Diagnosis not present

## 2018-12-09 DIAGNOSIS — I10 Essential (primary) hypertension: Secondary | ICD-10-CM

## 2018-12-09 HISTORY — DX: Hemorrhage of anus and rectum: K62.5

## 2018-12-09 HISTORY — DX: Encounter for screening for malignant neoplasm of colon: Z12.11

## 2018-12-09 HISTORY — DX: Hypokalemia: E87.6

## 2018-12-09 LAB — CBC
HCT: 34 % — ABNORMAL LOW (ref 39.0–52.0)
Hemoglobin: 11.8 g/dL — ABNORMAL LOW (ref 13.0–17.0)
MCHC: 34.6 g/dL (ref 30.0–36.0)
MCV: 91.1 fl (ref 78.0–100.0)
Platelets: 173 10*3/uL (ref 150.0–400.0)
RBC: 3.74 Mil/uL — ABNORMAL LOW (ref 4.22–5.81)
RDW: 13.3 % (ref 11.5–15.5)
WBC: 5 10*3/uL (ref 4.0–10.5)

## 2018-12-09 LAB — POTASSIUM: POTASSIUM: 3.1 meq/L — AB (ref 3.5–5.1)

## 2018-12-09 MED ORDER — PSYLLIUM 58.6 % PO POWD: 1.0000 | Freq: Three times a day (TID) | ORAL | 12 refills | Status: DC

## 2018-12-09 NOTE — Telephone Encounter (Signed)
Pt c/o rectal bleeding x 2 episodes this morning. Pt noted a small clot on the toilet paper. Pt stated that he could not tell if it was mixed in with the stool or on the surface of the stool. Pt stated he is having diarrhea and constipation. Pt stated he has had 3 diarrhea stools today. Pt stated he took some Pepto Bismol for the diarrhea. Pt stated no abdominal pain, vomiting, dizziness or fever. Pt stated that he had transient blurred vision that is resolved. Pt given care advice and pt verbalized understanding. Pt given appt with PCP today  Reason for Disposition . MODERATE rectal bleeding (small blood clots, passing blood without stool, or toilet water turns red)  Answer Assessment - Initial Assessment Questions 1. APPEARANCE of BLOOD: "What color is it?" "Is it passed separately, on the surface of the stool, or mixed in with the stool?"      Dark red- clot on toilet paper 2. AMOUNT: "How much blood was passed?"      Pt is unsure  3. FREQUENCY: "How many times has blood been passed with the stools?"      2 times 4. ONSET: "When was the blood first seen in the stools?" (Days or weeks)      This morning 5. DIARRHEA: "Is there also some diarrhea?" If so, ask: "How many diarrhea stools were passed in past 24 hours?"      Yes-3  6. CONSTIPATION: "Do you have constipation?" If so, "How bad is it?"     Yes- pt stated "not bad" 7. RECURRENT SYMPTOMS: "Have you had blood in your stools before?" If so, ask: "When was the last time?" and "What happened that time?"      no 8. BLOOD THINNERS: "Do you take any blood thinners?" (e.g., Coumadin/warfarin, Pradaxa/dabigatran, aspirin)     no 9. OTHER SYMPTOMS: "Do you have any other symptoms?"  (e.g., abdominal pain, vomiting, dizziness, fever)     Transient blurry vision that lasted 1/2 hour 10. PREGNANCY: "Is there any chance you are pregnant?" "When was your last menstrual period?"       n/a  Protocols used: RECTAL BLEEDING-A-AH

## 2018-12-09 NOTE — Progress Notes (Signed)
Established Patient Office Visit  Subjective:  Patient ID: Casey Greene, male    DOB: Jul 21, 1951  Age: 68 y.o. MRN: [id number]  CC:  Chief Complaint  Patient presents with  . Rectal Bleeding    x 2 episodes this AM.     HPI Casey Greene presents for evaluation of rectal bleeding. He has had 2 episodes this morning; he noted a small clot on the toilet paper. He is having some diarrhea and constipation. He has tried using Pepto Bismol for the constipation.  Thought that he needed to have a bowel movement and sat down on the toilet but the bowel movement never came.  When he wiped he noticed blood on the toilet bowl.  This happened twice today.  He was advised to come in to the office.  He denies fever chills, nausea and vomiting or abdominal pain.  Rectal exam last week was negative for occult and gross blood.  Patient tells me that he is completed his course of therapy for hepatitis C.  Past Medical History:  Diagnosis Date  . Decreased appetite   . Foley catheter in place   . Hepatitis C   . History of gout   . Hypertension   . Prostate cancer ([place])   . Slow heartbeat   . Urinary retention     Past Surgical History:  Procedure Laterality Date  . ACHILLES TENDON REPAIR    . CERVICAL DISC SURGERY     neck  . LYMPHADENECTOMY Bilateral 01/26/2016   Procedure: EXTENDED BILATERAL PELVIC LYMPHADENECTOMY;  Surgeon: Raynelle Bring, MD;  Location: [facility];  Service: Urology;  Laterality: Bilateral;  . NOSE SURGERY     in high school-nasal fracturex2  . ROBOT ASSISTED LAPAROSCOPIC RADICAL PROSTATECTOMY N/A 01/26/2016   Procedure: XI ROBOTIC ASSISTED LAPAROSCOPIC RADICAL PROSTATECTOMY LEVEL 3;  Surgeon: Raynelle Bring, MD;  Location: [facility];  Service: Urology;  Laterality: N/A;    Family History  Problem Relation Age of Onset  . Heart disease Mother   . Obesity Mother   . Cervical cancer Mother   . Heart disease Father   . Diabetes Father   . ALS Sister   . Colon cancer Neg Hx      Social History   Socioeconomic History  . Marital status: Married    Spouse name: Not on file  . Number of children: Not on file  . Years of education: Not on file  . Highest education level: Not on file  Occupational History  . Not on file  Social Needs  . Financial resource strain: Not on file  . Food insecurity:    Worry: Not on file    Inability: Not on file  . Transportation needs:    Medical: Not on file    Non-medical: Not on file  Tobacco Use  . Smoking status: Former Smoker    Packs/day: 0.50    Years: 10.00    Pack years: 5.00    Types: Cigarettes    Last attempt to quit: 01/11/2006    Years since quitting: 12.9  . Smokeless tobacco: Never Used  Substance and Sexual Activity  . Alcohol use: No  . Drug use: No  . Sexual activity: Not Currently  Lifestyle  . Physical activity:    Days per week: Not on file    Minutes per session: Not on file  . Stress: Not on file  Relationships  . Social connections:    Talks on phone: Not on file  Gets together: Not on file    Attends religious service: Not on file    Active member of club or organization: Not on file    Attends meetings of clubs or organizations: Not on file    Relationship status: Not on file  . Intimate partner violence:    Fear of current or ex partner: Not on file    Emotionally abused: Not on file    Physically abused: Not on file    Forced sexual activity: Not on file  Other Topics Concern  . Not on file  Social History Narrative  . Not on file    Outpatient Medications Prior to Visit  Medication Sig Dispense Refill  . Calcium Carbonate-Vit D-Min (CALTRATE 600+D PLUS PO) Take by mouth.    . docusate sodium (COLACE) 100 MG capsule Take 1 capsule (100 mg total) by mouth 2 (two) times daily. 100 capsule 0  . trospium (SANCTURA) 20 MG tablet Take 20 mg by mouth 2 (two) times daily.    . Glecaprevir-Pibrentasvir (MAVYRET) 100-40 MG TABS Take 3 tablets by mouth daily with breakfast. 84  tablet 2   No facility-administered medications prior to visit.     Allergies  Allergen Reactions  . Amlodipine Other (See Comments)    headache  . Tamsulosin Hcl     ROS Review of Systems  Constitutional: Negative for chills, diaphoresis, fatigue, fever and unexpected weight change.  HENT: Negative.   Eyes: Negative for photophobia and visual disturbance.  Respiratory: Negative.   Cardiovascular: Negative.   Gastrointestinal: Positive for anal bleeding and constipation. Negative for abdominal distention, abdominal pain, blood in stool, diarrhea, nausea, rectal pain and vomiting.  Endocrine: Negative for polyphagia and polyuria.  Skin: Negative for rash.  Neurological: Negative for light-headedness and headaches.  Hematological: Does not bruise/bleed easily.  Psychiatric/Behavioral: Negative.       Objective:    Physical Exam  Constitutional: He is oriented to person, place, and time. He appears well-developed and well-nourished. No distress.  HENT:  Head: Normocephalic and atraumatic.  Right Ear: External ear normal.  Left Ear: External ear normal.  Eyes: Right eye exhibits no discharge. Left eye exhibits no discharge. No scleral icterus.  Neck: No JVD present. No tracheal deviation present.  Pulmonary/Chest: Effort normal. No stridor.  Genitourinary: Rectum:     Guaiac result positive.     No rectal mass, anal fissure, tenderness, external hemorrhoid, internal hemorrhoid or abnormal anal tone.   Neurological: He is alert and oriented to person, place, and time.  Skin: Skin is warm and dry. He is not diaphoretic.  Psychiatric: He has a normal mood and affect. His behavior is normal.    BP 128/80   Pulse 80   Temp 97.9 F (36.6 C) (Oral)   Ht 5\' 9"  (1.753 m)   Wt 201 lb (91.2 kg)   SpO2 99%   BMI 29.68 kg/m  Wt Readings from Last 3 Encounters:  12/09/18 201 lb (91.2 kg)  12/01/18 201 lb 2 oz (91.2 kg)  10/08/18 196 lb (88.9 kg)   BP Readings from Last 3  Encounters:  12/09/18 128/80  12/01/18 (!) 160/98  07/03/18 (!) 146/100   Guideline developer:  UpToDate (see UpToDate for funding source) Date Released: June 2014   Health Maintenance Due  Topic Date Due  . Samul Dada  01/09/1968  . COLONOSCOPY  01/08/1999  . PNA vac Low Risk Adult (1 of 2 - PCV13) 01/08/2014  . INFLUENZA VACCINE  06/26/2018  There are no preventive care reminders to display for this patient.  Lab Results  Component Value Date   TSH 2.72 03/28/2018   Lab Results  Component Value Date   WBC 5.9 12/01/2018   HGB 11.6 (L) 12/01/2018   HCT 33.6 (L) 12/01/2018   MCV 92.0 12/01/2018   PLT 168.0 12/01/2018   Lab Results  Component Value Date   NA 141 12/01/2018   K 3.2 (L) 12/01/2018   CO2 34 (H) 12/01/2018   GLUCOSE 101 (H) 12/01/2018   BUN 35 (H) 12/01/2018   CREATININE 1.85 (H) 12/01/2018   BILITOT 0.6 06/10/2018   ALKPHOS 54 03/28/2018   AST 32 06/10/2018   ALT 12 06/10/2018   PROT 7.8 06/10/2018   ALBUMIN 4.0 03/28/2018   CALCIUM 9.9 12/01/2018   ANIONGAP 8 09/08/2016   GFR 46.73 (L) 12/01/2018   Lab Results  Component Value Date   CHOL 190 03/28/2018   Lab Results  Component Value Date   HDL 61.90 03/28/2018   Lab Results  Component Value Date   LDLCALC 116 (H) 03/28/2018   Lab Results  Component Value Date   TRIG 61.0 03/28/2018   Lab Results  Component Value Date   CHOLHDL 3 03/28/2018   No results found for: HGBA1C    Assessment & Plan:   Problem List Items Addressed This Visit      Digestive   Bleeding per rectum   Relevant Orders   CBC   Ambulatory referral to Gastroenterology     Other   Noncompliance by refusing intervention or support - Primary   Refusal of medication   Constipation   Relevant Medications   psyllium (METAMUCIL SMOOTH TEXTURE) 58.6 % powder   Hypokalemia   Screen for colon cancer   Relevant Orders   Ambulatory referral to Gastroenterology      Meds ordered this encounter    Medications  . psyllium (METAMUCIL SMOOTH TEXTURE) 58.6 % powder    Sig: Take 1 packet by mouth 3 (three) times daily. As needed.    Dispense:  283 g    Refill:  12    Follow-up: Return in about 1 week (around 12/16/2018).   Follow-up on hypokalemia today.  Sent in Rx for Metamucil.  Suggested that milk of magnesia may help more for constipation than Pepto-Bismol which is used more for diarrhea.  Patient agreed to consultation for colonoscopy.  Advised treatment for his blood pressure and patient told me that he would deal with it himself.  Patient will go to the emergency room with blood in the toilet bowl.  We will see him back in a week.  Constipation and bleeding persist will consider CT scan.

## 2018-12-09 NOTE — Patient Instructions (Signed)

## 2018-12-12 ENCOUNTER — Ambulatory Visit: Payer: Self-pay | Admitting: *Deleted

## 2018-12-12 ENCOUNTER — Encounter: Payer: Self-pay | Admitting: Gastroenterology

## 2018-12-12 NOTE — Telephone Encounter (Signed)
Pt was advised to also see me next week.

## 2018-12-12 NOTE — Telephone Encounter (Signed)
I left patient a voicemail to return my call; crm created. He needs a follow up with Dr. Ethelene Hal next week since his GI appointment isn't until 1/29.

## 2018-12-12 NOTE — Addendum Note (Signed)
Addended by: Jon Billings on: 12/12/2018 08:31 AM   Modules accepted: Orders

## 2018-12-12 NOTE — Telephone Encounter (Signed)
Summary: Periodic Rectal Bleeding   Pt's wife called and stated that pt was still having some bleeding from his rectum. He would like to know what to do until his appointment for a colonoscopy. Please advise and call pt as wife was not with him. YI#[phone]     Patient was in the office this week for rectal bleeding. Patient is reporting that his bleeding is not that heavy at this time.Patient has been given perimeters for hospital follow up and he does not meet those at this time. He has been referred to GI and has appointment scheduled for 12/24/18. Call to GI to see if they have sooner appointment for him- they do have sooner appointment(next week)- patient conferenced to line to make that decision.  Reason for Disposition . MILD rectal bleeding (more than just a few drops or streaks)    Patient was seen and evaluated in office- he has been referred to GI- he is reporting another occurrence. Call to GI office to see if appointment can be moved up- they can accommodate.  Answer Assessment - Initial Assessment Questions 1. APPEARANCE of BLOOD: "What color is it?" "Is it passed separately, on the surface of the stool, or mixed in with the stool?"      Dark red- mixed in with the stool- loose stool 2. AMOUNT: "How much blood was passed?"      Small clots- 1 clot 3. FREQUENCY: "How many times has blood been passed with the stools?"      This is the first occurrence since visit in the office 4. ONSET: "When was the blood first seen in the stools?" (Days or weeks)      Monday started 5. DIARRHEA: "Is there also some diarrhea?" If so, ask: "How many diarrhea stools were passed in past 24 hours?"      Loose stool mixed with constipation- loose stools in last 24 hours-1 6. CONSTIPATION: "Do you have constipation?" If so, "How bad is it?"     Constipation-none 7. RECURRENT SYMPTOMS: "Have you had blood in your stools before?" If so, ask: "When was the last time?" and "What happened that time?"    Never had this before 8. BLOOD THINNERS: "Do you take any blood thinners?" (e.g., Coumadin/warfarin, Pradaxa/dabigatran, aspirin)     no 9. OTHER SYMPTOMS: "Do you have any other symptoms?"  (e.g., abdominal pain, vomiting, dizziness, fever)     no 10. PREGNANCY: "Is there any chance you are pregnant?" "When was your last menstrual period?"       n/a  Protocols used: RECTAL BLEEDING-A-AH

## 2018-12-12 NOTE — Telephone Encounter (Signed)
fyi

## 2018-12-15 ENCOUNTER — Encounter: Payer: Self-pay | Admitting: Gastroenterology

## 2018-12-15 ENCOUNTER — Ambulatory Visit: Payer: Medicare Other | Admitting: Family Medicine

## 2018-12-15 NOTE — Telephone Encounter (Signed)
I spoke with patient, he is coming in at 2:30pm today.

## 2018-12-16 ENCOUNTER — Ambulatory Visit: Payer: Medicare Other | Admitting: Family Medicine

## 2018-12-16 ENCOUNTER — Ambulatory Visit: Payer: Medicare Other | Admitting: Gastroenterology

## 2018-12-18 ENCOUNTER — Encounter: Payer: Self-pay | Admitting: Family Medicine

## 2018-12-18 ENCOUNTER — Ambulatory Visit (INDEPENDENT_AMBULATORY_CARE_PROVIDER_SITE_OTHER): Payer: Medicare Other | Admitting: Family Medicine

## 2018-12-18 VITALS — BP 130/80 | HR 74 | Ht 69.0 in

## 2018-12-18 DIAGNOSIS — K625 Hemorrhage of anus and rectum: Secondary | ICD-10-CM

## 2018-12-18 DIAGNOSIS — E876 Hypokalemia: Secondary | ICD-10-CM

## 2018-12-18 DIAGNOSIS — I10 Essential (primary) hypertension: Secondary | ICD-10-CM | POA: Diagnosis not present

## 2018-12-18 NOTE — Progress Notes (Signed)
Established Patient Office Visit  Subjective:  Patient ID: Casey Greene, male    DOB: [birth year]  Age: 68 y.o. MRN: [id number]  CC:  Chief Complaint  Patient presents with  . Follow-up    HPI Casey Greene presents for follow-up of his bleeding per rectum.  Continues to note blood when wiping.  There is no blood in the stool or in the toilet bowl.  He has not felt lightheaded or weak.  There is been no melena.  His stools have been on the loose side.  There is been no abdominal pain other than some mild cramping that was relieved 20 took his Metamucil.  Blood pressure is improved today when I asked about it he told me that he decided to take the medicine that I had put him on.  He did this because a friend had died recently had a heart attack.  Past Medical History:  Diagnosis Date  . Decreased appetite   . Foley catheter in place   . Hepatitis C   . History of gout   . Hypertension   . Prostate cancer ([place])   . Slow heartbeat   . Urinary retention     Past Surgical History:  Procedure Laterality Date  . ACHILLES TENDON REPAIR    . CERVICAL DISC SURGERY     neck  . LYMPHADENECTOMY Bilateral 01/26/2016   Procedure: EXTENDED BILATERAL PELVIC LYMPHADENECTOMY;  Surgeon: Raynelle Bring, MD;  Location: [facility];  Service: Urology;  Laterality: Bilateral;  . NOSE SURGERY     in high school-nasal fracturex2  . ROBOT ASSISTED LAPAROSCOPIC RADICAL PROSTATECTOMY N/A 01/26/2016   Procedure: XI ROBOTIC ASSISTED LAPAROSCOPIC RADICAL PROSTATECTOMY LEVEL 3;  Surgeon: Raynelle Bring, MD;  Location: [facility];  Service: Urology;  Laterality: N/A;    Family History  Problem Relation Age of Onset  . Heart disease Mother   . Obesity Mother   . Cervical cancer Mother   . Heart disease Father   . Diabetes Father   . ALS Sister   . Colon cancer Neg Hx     Social History   Socioeconomic History  . Marital status: Married    Spouse name: Not on file  . Number of children: Not on file  . Years  of education: Not on file  . Highest education level: Not on file  Occupational History  . Not on file  Social Needs  . Financial resource strain: Not on file  . Food insecurity:    Worry: Not on file    Inability: Not on file  . Transportation needs:    Medical: Not on file    Non-medical: Not on file  Tobacco Use  . Smoking status: Former Smoker    Packs/day: 0.50    Years: 10.00    Pack years: 5.00    Types: Cigarettes    Last attempt to quit: 01/11/2006    Years since quitting: 12.9  . Smokeless tobacco: Never Used  Substance and Sexual Activity  . Alcohol use: No  . Drug use: No  . Sexual activity: Not Currently  Lifestyle  . Physical activity:    Days per week: Not on file    Minutes per session: Not on file  . Stress: Not on file  Relationships  . Social connections:    Talks on phone: Not on file    Gets together: Not on file    Attends religious service: Not on file    Active member of club or organization:  Not on file    Attends meetings of clubs or organizations: Not on file    Relationship status: Not on file  . Intimate partner violence:    Fear of current or ex partner: Not on file    Emotionally abused: Not on file    Physically abused: Not on file    Forced sexual activity: Not on file  Other Topics Concern  . Not on file  Social History Narrative  . Not on file    Outpatient Medications Prior to Visit  Medication Sig Dispense Refill  . Calcium Carbonate-Vit D-Min (CALTRATE 600+D PLUS PO) Take by mouth.    . docusate sodium (COLACE) 100 MG capsule Take 1 capsule (100 mg total) by mouth 2 (two) times daily. 100 capsule 0  . psyllium (METAMUCIL SMOOTH TEXTURE) 58.6 % powder Take 1 packet by mouth 3 (three) times daily. As needed. 283 g 12  . trospium (SANCTURA) 20 MG tablet Take 20 mg by mouth 2 (two) times daily.     No facility-administered medications prior to visit.     Allergies  Allergen Reactions  . Amlodipine Other (See Comments)     headache  . Tamsulosin Hcl     ROS Review of Systems  Constitutional: Negative for chills, diaphoresis, fatigue, fever and unexpected weight change.  HENT: Negative.   Eyes: Negative for photophobia and visual disturbance.  Respiratory: Negative.   Cardiovascular: Negative.   Gastrointestinal: Positive for anal bleeding. Negative for abdominal pain, blood in stool, constipation, diarrhea, nausea, rectal pain and vomiting.  Genitourinary: Negative for hematuria.  Skin: Negative for pallor and rash.  Allergic/Immunologic: Negative for immunocompromised state.  Neurological: Negative for light-headedness and headaches.  Hematological: Does not bruise/bleed easily.  Psychiatric/Behavioral: Negative.       Objective:    Physical Exam  Constitutional: He is oriented to person, place, and time. He appears well-developed and well-nourished. No distress.  HENT:  Head: Normocephalic and atraumatic.  Right Ear: External ear normal.  Left Ear: External ear normal.  Eyes: Conjunctivae are normal. Right eye exhibits no discharge. Left eye exhibits no discharge. No scleral icterus.  Neck: No JVD present. No tracheal deviation present.  Cardiovascular: Normal rate, regular rhythm and normal heart sounds.  Occasional extrasystoles are present.  Pulmonary/Chest: Effort normal. No stridor.  Abdominal: Soft. Bowel sounds are normal. He exhibits distension. There is no abdominal tenderness. There is no rebound and no guarding. A hernia is present. Hernia confirmed positive in the ventral area.  Neurological: He is alert and oriented to person, place, and time.  Skin: Skin is warm and dry. He is not diaphoretic.  Psychiatric: He has a normal mood and affect. His behavior is normal.    BP 130/80   Pulse 74   Ht 5\' 9"  (1.753 m)   SpO2 100%   BMI 29.68 kg/m  Wt Readings from Last 3 Encounters:  12/09/18 201 lb (91.2 kg)  12/01/18 201 lb 2 oz (91.2 kg)  10/08/18 196 lb (88.9 kg)   BP Readings  from Last 3 Encounters:  12/18/18 130/80  12/09/18 128/80  12/01/18 (!) 160/98   Guideline developer:  UpToDate (see UpToDate for funding source) Date Released: June 2014  Health Maintenance Due  Topic Date Due  . Samul Dada  01/09/1968  . COLONOSCOPY  01/08/1999  . PNA vac Low Risk Adult (1 of 2 - PCV13) 01/08/2014  . INFLUENZA VACCINE  06/26/2018    There are no preventive care reminders to display for this  patient.  Lab Results  Component Value Date   TSH 2.72 03/28/2018   Lab Results  Component Value Date   WBC 5.0 12/09/2018   HGB 11.8 (L) 12/09/2018   HCT 34.0 (L) 12/09/2018   MCV 91.1 12/09/2018   PLT 173.0 12/09/2018   Lab Results  Component Value Date   NA 141 12/01/2018   K 3.1 (L) 12/09/2018   CO2 34 (H) 12/01/2018   GLUCOSE 101 (H) 12/01/2018   BUN 35 (H) 12/01/2018   CREATININE 1.85 (H) 12/01/2018   BILITOT 0.6 06/10/2018   ALKPHOS 54 03/28/2018   AST 32 06/10/2018   ALT 12 06/10/2018   PROT 7.8 06/10/2018   ALBUMIN 4.0 03/28/2018   CALCIUM 9.9 12/01/2018   ANIONGAP 8 09/08/2016   GFR 46.73 (L) 12/01/2018   Lab Results  Component Value Date   CHOL 190 03/28/2018   Lab Results  Component Value Date   HDL 61.90 03/28/2018   Lab Results  Component Value Date   LDLCALC 116 (H) 03/28/2018   Lab Results  Component Value Date   TRIG 61.0 03/28/2018   Lab Results  Component Value Date   CHOLHDL 3 03/28/2018   No results found for: HGBA1C    Assessment & Plan:   Problem List Items Addressed This Visit      Cardiovascular and Mediastinum   Essential hypertension - Primary   Relevant Orders   Basic metabolic panel     Digestive   Bleeding per rectum   Relevant Orders   CBC   Iron, TIBC and Ferritin Panel     Other   Hypokalemia      No orders of the defined types were placed in this encounter.   Follow-up: Return in about 2 weeks (around 01/01/2019).   Patient continues to notice blood with wiping.  Exam remains benign.   Rechecking hemoglobin today.  Recent rectal exam was negative except for trace stool Hemoccult positive.  Rechecking potassium.  Aldosterone will be drawn today as well.  Follow-up in 2 weeks for recheck.  He will bring his blood pressure medicine with him.  GI consult is on the 29th.

## 2018-12-19 LAB — CBC
HEMATOCRIT: 32.7 % — AB (ref 39.0–52.0)
Hemoglobin: 11.2 g/dL — ABNORMAL LOW (ref 13.0–17.0)
MCHC: 34.3 g/dL (ref 30.0–36.0)
MCV: 92.2 fl (ref 78.0–100.0)
PLATELETS: 186 10*3/uL (ref 150.0–400.0)
RBC: 3.54 Mil/uL — AB (ref 4.22–5.81)
RDW: 13.3 % (ref 11.5–15.5)
WBC: 5.4 10*3/uL (ref 4.0–10.5)

## 2018-12-19 LAB — BASIC METABOLIC PANEL
BUN: 38 mg/dL — ABNORMAL HIGH (ref 6–23)
CHLORIDE: 101 meq/L (ref 96–112)
CO2: 32 meq/L (ref 19–32)
Calcium: 10.1 mg/dL (ref 8.4–10.5)
Creatinine, Ser: 2.17 mg/dL — ABNORMAL HIGH (ref 0.40–1.50)
GFR: 36.56 mL/min — ABNORMAL LOW (ref >60.00)
GLUCOSE: 100 mg/dL — AB (ref 70–99)
POTASSIUM: 3.4 meq/L — AB (ref 3.5–5.1)
SODIUM: 142 meq/L (ref 135–145)

## 2018-12-19 LAB — IRON,TIBC AND FERRITIN PANEL
%SAT: 21 % (calc) (ref 20–48)
Ferritin: 153 ng/mL (ref 24–380)
Iron: 63 ug/dL (ref 50–180)
TIBC: 307 mcg/dL (calc) (ref 250–425)

## 2018-12-24 ENCOUNTER — Ambulatory Visit (INDEPENDENT_AMBULATORY_CARE_PROVIDER_SITE_OTHER): Payer: Medicare Other | Admitting: Gastroenterology

## 2018-12-24 ENCOUNTER — Ambulatory Visit: Payer: Medicare Other | Admitting: Gastroenterology

## 2018-12-24 ENCOUNTER — Encounter: Payer: Self-pay | Admitting: Gastroenterology

## 2018-12-24 VITALS — BP 160/100 | HR 65 | Ht 70.0 in | Wt 204.1 lb

## 2018-12-24 DIAGNOSIS — K625 Hemorrhage of anus and rectum: Secondary | ICD-10-CM

## 2018-12-24 LAB — ALDOSTERONE + RENIN ACTIVITY W/ RATIO
ALDO / PRA Ratio: 13.2 Ratio (ref 0.9–28.9)
ALDOSTERONE: 7 ng/dL
Renin Activity: 0.53 ng/mL/h (ref 0.25–5.82)

## 2018-12-24 NOTE — Progress Notes (Signed)
  Chief Complaint: Rectal bleeding  Referring Provider:  Kremer, William Alfred,*      ASSESSMENT AND PLAN;   #1. Rectal bleeding - D/d hoids, AVMs, colitis, polyps, stercoral ulcers etc, r/o colonic neoplasms or IBD. #2. H/O Hep C (treated with Mavyret x 12 weeks 07/2018) geno 2, F3/F4 cirrhosis. ID planning MRI/AFP for HCC screening next visit. #3. Anemia of chronic disease d/t CRI.  Plan: - Continue metamucil and Colace 1 tablet p.o. once a day. - Proceed with colonoscopy with MiraLAX.  I have discussed the risks and benefits.  The risks including risk of perforation requiring laparotomy, bleeding after polypectomy requiring blood transfusions and risks of anesthesia/sedation were discussed.  Rare risks of missing colorectal neoplasms were also discussed. Consent forms were given for review. - D/W patient's wife.   HPI:    Casey Greene is a 67 y.o. male  With intermittent rectal bleeding over the last few months. Has history of constipation Has been taking Metamucil now on regular basis with good results.  Rectal bleeding has resolved. Denies having any significant abdominal pain. No nonsteroidals. No fever chills night sweats or recent weight loss. Has been advised to get colonoscopy performed.  He had colonoscopy over 10 years ago in [place] which was negative for any polyps.  No family history of colon cancer.  No upper GI symptoms including nausea, vomiting, heartburn, regurgitation, odynophagia or dysphagia.   Past Medical History:  Diagnosis Date  . Decreased appetite   . Foley catheter in place   . Hepatitis C   . History of gout   . Hypertension   . Prostate cancer (HCC)   . Slow heartbeat   . Urinary retention     Past Surgical History:  Procedure Laterality Date  . ACHILLES TENDON REPAIR    . CERVICAL DISC SURGERY     neck  . colonoscopy     before 2010   . LYMPHADENECTOMY Bilateral 01/26/2016   Procedure: EXTENDED BILATERAL PELVIC  LYMPHADENECTOMY;  Surgeon: Lester Borden, MD;  Location: [facility];  Service: Urology;  Laterality: Bilateral;  . NOSE SURGERY     in high school-nasal fracturex2  . ROBOT ASSISTED LAPAROSCOPIC RADICAL PROSTATECTOMY N/A 01/26/2016   Procedure: XI ROBOTIC ASSISTED LAPAROSCOPIC RADICAL PROSTATECTOMY LEVEL 3;  Surgeon: Lester Borden, MD;  Location: [facility];  Service: Urology;  Laterality: N/A;    Family History  Problem Relation Age of Onset  . Heart disease Mother   . Obesity Mother   . Cervical cancer Mother   . Heart disease Father   . Diabetes Father   . ALS Sister   . Colon cancer Neg Hx   . Esophageal cancer Neg Hx     Social History   Tobacco Use  . Smoking status: Former Smoker    Packs/day: 0.50    Years: 10.00    Pack years: 5.00    Types: Cigarettes    Last attempt to quit: 01/11/2006    Years since quitting: 12.9  . Smokeless tobacco: Never Used  Substance Use Topics  . Alcohol use: No  . Drug use: No    Current Outpatient Medications  Medication Sig Dispense Refill  . Calcium Carbonate-Vit D-Min (CALTRATE 600+D PLUS PO) Take by mouth.    . psyllium (METAMUCIL SMOOTH TEXTURE) 58.6 % powder Take 1 packet by mouth 3 (three) times daily. As needed. 283 g 12  . AMBULATORY NON FORMULARY MEDICATION Patient is suppose to be on blood pressure medication but doesn't remember   the name    . docusate sodium (COLACE) 100 MG capsule Take 1 capsule (100 mg total) by mouth 2 (two) times daily. (Patient not taking: Reported on 12/24/2018) 100 capsule 0  . trospium (SANCTURA) 20 MG tablet Take 20 mg by mouth 2 (two) times daily.     No current facility-administered medications for this visit.     Allergies  Allergen Reactions  . Amlodipine Other (See Comments)    headache  . Tamsulosin Hcl     Review of Systems:  Constitutional: Denies fever, chills, diaphoresis, appetite change and fatigue.  HEENT: Denies photophobia, eye pain, redness, hearing loss, ear pain, congestion, sore  throat, rhinorrhea, sneezing, mouth sores, neck pain, neck stiffness and tinnitus.   Respiratory: Denies SOB, DOE, cough, chest tightness,  and wheezing.   Cardiovascular: Denies chest pain, palpitations and leg swelling.  Genitourinary: Denies dysuria, urgency, frequency, hematuria, flank pain and difficulty urinating.  Musculoskeletal: Denies myalgias, back pain, joint swelling, arthralgias and gait problem.  Skin: No rash.  Neurological: Denies dizziness, seizures, syncope, weakness, light-headedness, numbness and headaches.  Hematological: Denies adenopathy. Easy bruising, personal or family bleeding history  Psychiatric/Behavioral: No anxiety or depression     Physical Exam:    BP (!) 160/100   Pulse 65   Ht 5' 10" (1.778 m)   Wt 204 lb 2 oz (92.6 kg)   BMI 29.29 kg/m  Filed Weights   12/24/18 1458  Weight: 204 lb 2 oz (92.6 kg)   Constitutional:  Well-developed, in no acute distress. Psychiatric: Normal mood and affect. Behavior is normal. HEENT: Pupils normal.  Conjunctivae are normal. No scleral icterus. Neck supple.  Cardiovascular: Normal rate, regular rhythm. No edema Pulmonary/chest: Effort normal and breath sounds normal. No wheezing, rales or rhonchi. Abdominal: Soft, nondistended. Nontender. Bowel sounds active throughout. There are no masses palpable. No hepatomegaly.  Well-healed surgical scars.  Anterior 2 cm abdominal wall incisional hernia. Rectal: To be performed at the time of colonoscopy. Neurological: Alert and oriented to person place and time. Skin: Skin is warm and dry. No rashes noted.  Data Reviewed: I have personally reviewed following labs and imaging studies  CBC: CBC Latest Ref Rng & Units 12/18/2018 12/09/2018 12/01/2018  WBC 4.0 - 10.5 K/uL 5.4 5.0 5.9  Hemoglobin 13.0 - 17.0 g/dL 11.2(L) 11.8(L) 11.6(L)  Hematocrit 39.0 - 52.0 % 32.7(L) 34.0(L) 33.6(L)  Platelets 150.0 - 400.0 K/uL 186.0 173.0 168.0    CMP: CMP Latest Ref Rng & Units  12/18/2018 12/09/2018 12/01/2018  Glucose 70 - 99 mg/dL 100(H) - 101(H)  BUN 6 - 23 mg/dL 38(H) - 35(H)  Creatinine 0.40 - 1.50 mg/dL 2.17(H) - 1.85(H)  Sodium 135 - 145 mEq/L 142 - 141  Potassium 3.5 - 5.1 mEq/L 3.4(L) 3.1(L) 3.2(L)  Chloride 96 - 112 mEq/L 101 - 100  CO2 19 - 32 mEq/L 32 - 34(H)  Calcium 8.4 - 10.5 mg/dL 10.1 - 9.9  Total Protein 6.1 - 8.1 g/dL - - -  Total Bilirubin 0.2 - 1.2 mg/dL - - -  Alkaline Phos 39 - 117 U/L - - -  AST 10 - 35 U/L - - -  ALT 9 - 46 U/L - - -   Hepatic Function Latest Ref Rng & Units 06/10/2018 05/01/2018 05/01/2018  Total Protein 6.1 - 8.1 g/dL 7.8 8.1 -  Albumin 3.5 - 5.2 g/dL - - -  AST 10 - 35 U/L 32 60(H) -  ALT 9 - 46 U/L 12 41 41    Alk Phosphatase 39 - 117 U/L - - -  Total Bilirubin 0.2 - 1.2 mg/dL 0.6 0.7 -      Carmell Austria, MD 12/24/2018, 3:19 PM  Cc: Libby Maw

## 2018-12-24 NOTE — Patient Instructions (Signed)
If you are age 70 or older, your body mass index should be between 23-30. Your Body mass index is 29.29 kg/m. If this is out of the aforementioned range listed, please consider follow up with your Primary Care Provider.  If you are age 71 or younger, your body mass index should be between 19-25. Your Body mass index is 29.29 kg/m. If this is out of the aformentioned range listed, please consider follow up with your Primary Care Provider.   It has been recommended to you by your physician that you have a(n) Colonoscopy completed. Per your request, we did not schedule the procedure(s) today. Please contact our office at [phone]  to have the procedure completed.    Thank you,  Dr. Jackquline Denmark

## 2019-01-01 ENCOUNTER — Encounter: Payer: Self-pay | Admitting: Gastroenterology

## 2019-01-02 ENCOUNTER — Ambulatory Visit: Payer: Medicare Other | Admitting: Family Medicine

## 2019-01-05 ENCOUNTER — Ambulatory Visit (INDEPENDENT_AMBULATORY_CARE_PROVIDER_SITE_OTHER): Payer: Medicare Other | Admitting: Internal Medicine

## 2019-01-05 ENCOUNTER — Encounter: Payer: Self-pay | Admitting: Internal Medicine

## 2019-01-05 VITALS — BP 167/130 | HR 82 | Temp 98.3°F | Ht 70.0 in | Wt 199.2 lb

## 2019-01-05 DIAGNOSIS — Z79899 Other long term (current) drug therapy: Secondary | ICD-10-CM

## 2019-01-05 DIAGNOSIS — Z23 Encounter for immunization: Secondary | ICD-10-CM | POA: Diagnosis not present

## 2019-01-05 DIAGNOSIS — B182 Chronic viral hepatitis C: Secondary | ICD-10-CM | POA: Diagnosis not present

## 2019-01-05 DIAGNOSIS — C61 Malignant neoplasm of prostate: Secondary | ICD-10-CM | POA: Diagnosis not present

## 2019-01-05 NOTE — Progress Notes (Signed)
RFV: chronic hep c, treated at end of sept 2019  Patient ID: Casey Greene, male   DOB: 1951/07/05, 68 y.o.   MRN: [id number]  HPI Casey Greene a 68 y.o.malewho presents to the [facility] for Hep C follow-up. He has genotype 2, F3/F4 with CP class A, and started 12 weeks of Mavyret on 6/24 which he finished at end of September. Doing well otherwise. 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     Outpatient Encounter Medications as of 01/05/2019  Medication Sig  . AMBULATORY NON FORMULARY MEDICATION Patient is suppose to be on blood pressure medication but doesn't remember the name  . Calcium Carbonate-Vit D-Min (CALTRATE 600+D PLUS PO) Take by mouth.  . docusate sodium (COLACE) 100 MG capsule Take 1 capsule (100 mg total) by mouth 2 (two) times daily. (Patient not taking: Reported on 12/24/2018)  . psyllium (METAMUCIL SMOOTH TEXTURE) 58.6 % powder Take 1 packet by mouth 3 (three) times daily. As needed.  . trospium (SANCTURA) 20 MG tablet Take 20 mg by mouth 2 (two) times daily.   No facility-administered encounter medications on file as of 01/05/2019.      Patient Active Problem List   Diagnosis Date Noted  . Bleeding per rectum 12/09/2018  . Hypokalemia 12/09/2018  . Screen for colon cancer 12/09/2018  . Constipation 12/01/2018  . Mass of thyroid region 03/28/2018  . Anemia of chronic disease 03/28/2018  . Noncompliance by refusing intervention or support 10/22/2016  . Refusal of medication 10/22/2016  . Edema 07/09/2016  . Depression 02/27/2016  . Prostate cancer ([place]) 01/26/2016  . CKD (chronic kidney disease) stage 3, GFR 30-59 ml/min (HCC) 10/13/2014  . Chronic hepatitis C without hepatic coma ([place]) 07/05/2014  . Hyperlipidemia 06/29/2014  . Essential hypertension 06/09/2014  . Onychomycosis 12/01/2013  . Pain, foot 12/01/2013  . PVD (peripheral  vascular disease) ([place]) 12/01/2013     Health Maintenance Due  Topic Date Due  . TETANUS/TDAP  01/09/1968  . COLONOSCOPY  01/08/1999  . PNA vac Low Risk Adult (1 of 2 - PCV13) 01/08/2014  . INFLUENZA VACCINE  06/26/2018     Review of Systems 12 point ros is negative. Physical Exam   BP (!) 167/130   Pulse 82   Temp 98.3 F (36.8 C) (Oral)   Ht 5\' 10"  (1.778 m)   Wt 199 lb 4 oz (90.4 kg)   BMI 28.59 kg/m   gen = a xo by 3 in NAD heent = PERRLA, EOMI, OP clear Pulm = CTAB no w/c/r Abd= ntnd, bs+ Ext= no c/c/e Lab Results  Component Value Date   HEPBSAB NEG 05/24/2010   Lab Results  Component Value Date   LABRPR NON REAC 09/06/2008    CBC Lab Results  Component Value Date   WBC 5.4 12/18/2018   RBC 3.54 (L) 12/18/2018   HGB 11.2 (L) 12/18/2018   HCT 32.7 (L) 12/18/2018   PLT 186.0 12/18/2018   MCV 92.2 12/18/2018   MCH 32.7 06/10/2018   MCHC 34.3 12/18/2018   RDW 13.3 12/18/2018   LYMPHSABS 360 (L) 06/03/2018   MONOABS 0.4 09/08/2016   EOSABS 59 06/03/2018    BMET Lab Results  Component Value Date   NA 142 12/18/2018   K 3.4 (L) 12/18/2018   CL 101 12/18/2018   CO2 32 12/18/2018   GLUCOSE 100 (H)  12/18/2018   BUN 38 (H) 12/18/2018   CREATININE 2.17 (H) 12/18/2018   CALCIUM 10.1 12/18/2018   GFRNONAA 24 (L) 05/01/2018   GFRAA 28 (L) 05/01/2018      Assessment and Plan  Chronic hep c wihtout hepatic coma Will check on hepatitis c viral load Surveillance of liver in Dec 2019 - didn't see any lesions Next surveillance for liver in June/ july  Health promotion = will give hep A #2, and hep B #3

## 2019-01-08 LAB — HEPATITIS C RNA QUANTITATIVE
HCV Quantitative Log: <1.18 Log IU/mL
HCV RNA, PCR, QN: NOT DETECTED [IU]/mL

## 2019-01-15 ENCOUNTER — Ambulatory Visit (AMBULATORY_SURGERY_CENTER): Payer: Self-pay | Admitting: *Deleted

## 2019-01-15 ENCOUNTER — Encounter: Payer: Self-pay | Admitting: Gastroenterology

## 2019-01-15 VITALS — Ht 70.0 in | Wt 199.0 lb

## 2019-01-15 DIAGNOSIS — K625 Hemorrhage of anus and rectum: Secondary | ICD-10-CM

## 2019-01-15 NOTE — Progress Notes (Signed)
No egg or soy allergy known to patient  No issues with past sedation with any surgeries  or procedures, no intubation problems  No diet pills per patient No home 02 use per patient  No blood thinners per patient  Pt denies issues with constipation  No A fib or A flutter  EMMI video sent to pt's e mail  Per OV note with Lyndel Safe 12-24-2018 use Miralax for bowel prep

## 2019-01-28 ENCOUNTER — Encounter: Payer: Self-pay | Admitting: Gastroenterology

## 2019-01-28 ENCOUNTER — Encounter: Payer: Medicare Other | Admitting: Gastroenterology

## 2019-01-28 ENCOUNTER — Ambulatory Visit (AMBULATORY_SURGERY_CENTER): Payer: Medicare Other | Admitting: Gastroenterology

## 2019-01-28 VITALS — BP 159/93 | HR 62 | Temp 98.0°F | Resp 11

## 2019-01-28 DIAGNOSIS — K648 Other hemorrhoids: Secondary | ICD-10-CM | POA: Diagnosis not present

## 2019-01-28 DIAGNOSIS — K5730 Diverticulosis of large intestine without perforation or abscess without bleeding: Secondary | ICD-10-CM | POA: Diagnosis not present

## 2019-01-28 DIAGNOSIS — K6289 Other specified diseases of anus and rectum: Secondary | ICD-10-CM

## 2019-01-28 DIAGNOSIS — K625 Hemorrhage of anus and rectum: Secondary | ICD-10-CM

## 2019-01-28 MED ORDER — SODIUM CHLORIDE 0.9 % IV SOLN: 500.0000 mL | Freq: Once | INTRAVENOUS | Status: DC

## 2019-01-28 NOTE — Op Note (Signed)
[name]: Casey Greene Procedure Date: 01/28/2019 9:00 AM MRN: [id number] Endoscopist: Casey Greene , MD Age: 68 Referring MD:  Date of Birth: May 08, 1951 Gender: Male Account #: [id number] Procedure:                Colonoscopy Indications:              Rectal bleeding Medicines:                Monitored Anesthesia Care Procedure:                Pre-Anesthesia Assessment:                           - Prior to the procedure, a History and Physical                            was performed, and patient medications and                            allergies were reviewed. The patient's tolerance of                            previous anesthesia was also reviewed. The risks                            and benefits of the procedure and the sedation                            options and risks were discussed with the patient.                            All questions were answered, and informed consent                            was obtained. Prior Anticoagulants: The patient has                            taken no previous anticoagulant or antiplatelet                            agents. ASA Grade Assessment: III - A patient with                            severe systemic disease. After reviewing the risks                            and benefits, the patient was deemed in                            satisfactory condition to undergo the procedure.                           After obtaining informed consent, the colonoscope  was passed under direct vision. Throughout the                            procedure, the patient's blood pressure, pulse, and                            oxygen saturations were monitored continuously. The                            Colonoscope was introduced through the anus and                            advanced to the 2 cm into the ileum. The                            colonoscopy was performed without difficulty. The                       patient tolerated the procedure well. The quality                            of the bowel preparation was good. The terminal                            ileum, ileocecal valve, appendiceal orifice, and                            rectum were photographed. Scope In: 9:04:44 AM Scope Out: 9:15:34 AM Scope Withdrawal Time: 0 hours 7 minutes 8 seconds  Total Procedure Duration: 0 hours 10 minutes 50 seconds  Findings:                 Multiple small localized telangiectasia without                            bleeding were found in the anterior rectum and few                            telangiectasia noted in in the 5 cm length of                            distal sigmoid colon. No active bleeding.                           Multiple small-mouthed diverticula were found in                            the sigmoid colon and few in the descending colon.                           Non-bleeding internal hemorrhoids were found during                            retroflexion. The hemorrhoids were medium-sized.  The exam was otherwise without abnormality on                            direct and retroflexion views.                           The terminal ileum appeared normal. Complications:            No immediate complications. Estimated Blood Loss:     Estimated blood loss: none. Impression:               -Telangiectasia in the rectum and distal sigmoid                            colon highly suggestive of mild radiation                            proctitis/colitis. No active bleeding.                           -Left colonic diverticulosis.                           -Non-bleeding internal hemorrhoids.                           -Otherwise normal colonoscopy to TI. Recommendation:           - Patient has a contact number available for                            emergencies. The signs and symptoms of potential                            delayed complications were  discussed with the                            patient. Return to normal activities tomorrow.                            Written discharge instructions were provided to the                            patient.                           - Resume previous diet.                           - Continue Metamucil and Colace.                           - Avoid nonsteroidals.                           - Repeat colonoscopy for retreatment if he has  further bleeding with RFA treatment at [facility].                            Otherwise symptomatic management. Of note that I                            could not find any notes regarding radiation                            treatment in epic.                           - Return to GI clinic in 12 weeks. Casey Denmark, MD 01/28/2019 9:26:53 AM This report has been signed electronically.

## 2019-01-28 NOTE — Progress Notes (Signed)
Pt awake. VSS. Report given to RN. No anesthetic complications noted 

## 2019-01-28 NOTE — Progress Notes (Addendum)
Pt has only passed a small amount of flatus.  He was taken to the restroom after 20 minutes to see if he can pass more flatus.  Maw   Pt did pass more flatus.  His abd was soft and denied discomfort.  Pt to be discharged to home.  Encouraged pt to continue to pass flatus.  To ambulate in the house off and on to also aid passing flatus.  maw

## 2019-01-28 NOTE — Patient Instructions (Signed)
YOU HAD AN ENDOSCOPIC PROCEDURE TODAY AT [facility]:   Refer to the procedure report that was given to you for any specific questions about what was found during the examination.  If the procedure report does not answer your questions, please call your gastroenterologist to clarify.  If you requested that your care partner not be given the details of your procedure findings, then the procedure report has been included in a sealed envelope for you to review at your convenience later.  YOU SHOULD EXPECT: Some feelings of bloating in the abdomen. Passage of more gas than usual.  Walking can help get rid of the air that was put into your GI tract during the procedure and reduce the bloating. If you had a lower endoscopy (such as a colonoscopy or flexible sigmoidoscopy) you may notice spotting of blood in your stool or on the toilet paper. If you underwent a bowel prep for your procedure, you may not have a normal bowel movement for a few days.  Please Note:  You might notice some irritation and congestion in your nose or some drainage.  This is from the oxygen used during your procedure.  There is no need for concern and it should clear up in a day or so.  SYMPTOMS TO REPORT IMMEDIATELY:   Following lower endoscopy (colonoscopy or flexible sigmoidoscopy):  Excessive amounts of blood in the stool  Significant tenderness or worsening of abdominal pains  Swelling of the abdomen that is new, acute  Fever of 100F or higher   Black, tarry-looking stools  For urgent or emergent issues, a gastroenterologist can be reached at any hour by calling [phone].   DIET:  We do recommend a small meal at first, but then you may proceed to your regular diet.  Drink plenty of fluids but you should avoid alcoholic beverages for 24 hours.  ACTIVITY:  You should plan to take it easy for the rest of today and you should NOT DRIVE or use heavy machinery until tomorrow (because of the sedation  medicines used during the test).    FOLLOW UP: Our staff will call the number listed on your records the next business day following your procedure to check on you and address any questions or concerns that you may have regarding the information given to you following your procedure. If we do not reach you, we will leave a message.  However, if you are feeling well and you are not experiencing any problems, there is no need to return our call.  We will assume that you have returned to your regular daily activities without incident.  If any biopsies were taken you will be contacted by phone or by letter within the next 1-3 weeks.  Please call us at [phone] if you have not heard about the biopsies in 3 weeks.    SIGNATURES/CONFIDENTIALITY: You and/or your care partner have signed paperwork which will be entered into your electronic medical record.  These signatures attest to the fact that that the information above on your After Visit Summary has been reviewed and is understood.  Full responsibility of the confidentiality of this discharge information lies with you and/or your care-partner.     Handouts were given to your care partner on diverticulosis and hemorrhoids. Continue taking Metamucil and Colace. Avoid non-steroidal medications. You may resume your current medications today. If further rectal bleeding , call Dr. Steve Rattler office. Return to Dr. Steve Rattler office for a follow up visit in 12 weeks.  The office will call you with this appointment. Please call if any questions or concerns.

## 2019-01-28 NOTE — Progress Notes (Signed)
Pt's states no medical or surgical changes since previsit or office visit. 

## 2019-01-29 ENCOUNTER — Telehealth: Payer: Self-pay

## 2019-01-29 NOTE — Telephone Encounter (Signed)
  Follow up Call-  Call back number 01/28/2019  Post procedure Call Back phone  # [phone] cell  Permission to leave phone message Yes  Some recent data might be hidden     Patient questions:  Do you have a fever, pain , or abdominal swelling? No. Pain Score  0 *  Have you tolerated food without any problems? Yes.    Have you been able to return to your normal activities? Yes.    Do you have any questions about your discharge instructions: Diet   No. Medications  No. Follow up visit  No.  Do you have questions or concerns about your Care? No.  Actions: * If pain score is 4 or above: No action needed, pain <4.  No problems noted per pt. maw

## 2019-03-04 ENCOUNTER — Telehealth: Payer: Self-pay | Admitting: Family Medicine

## 2019-03-04 DIAGNOSIS — I739 Peripheral vascular disease, unspecified: Secondary | ICD-10-CM

## 2019-03-04 DIAGNOSIS — N183 Chronic kidney disease, stage 3 unspecified: Secondary | ICD-10-CM

## 2019-03-04 DIAGNOSIS — I10 Essential (primary) hypertension: Secondary | ICD-10-CM

## 2019-03-04 NOTE — Telephone Encounter (Signed)
lmovm letting patient know Rx has been sent in & to call back to schedule a follow up appt.

## 2019-03-04 NOTE — Telephone Encounter (Signed)
Pt's wife left a vm requesting an update on this request

## 2019-03-05 NOTE — Telephone Encounter (Signed)
Judson Roch, can you reach out to pt's wife she left a vm stating that they want this rx sent to pharmacy through their insurance company so it can come to their home. I'm assuming she means a mail order pharmacy but she did not leave a name of one.

## 2019-03-09 MED ORDER — LOSARTAN POTASSIUM 50 MG PO TABS: 50.0000 mg | ORAL_TABLET | Freq: Every day | ORAL | 0 refills | Status: DC

## 2019-03-09 NOTE — Addendum Note (Signed)
Addended by: Nathanial Millman E on: 03/09/2019 10:06 AM   Modules accepted: Orders

## 2019-03-09 NOTE — Telephone Encounter (Signed)
Rx sent to [facility]

## 2019-03-17 ENCOUNTER — Encounter: Payer: Self-pay | Admitting: Radiation Oncology

## 2019-03-19 ENCOUNTER — Telehealth: Payer: Self-pay | Admitting: Family Medicine

## 2019-03-19 NOTE — Telephone Encounter (Signed)
Called and left vm for patient. Calling to schedule office appt with Ethelene Hal for July 2020

## 2019-04-23 ENCOUNTER — Encounter: Payer: Self-pay | Admitting: Family Medicine

## 2019-04-23 LAB — BASIC METABOLIC PANEL
BUN: 56 — AB (ref 4–21)
Creatinine: 2.6 — AB (ref 0.6–1.3)
Glucose: 107
Potassium: 4.2 (ref 3.4–5.3)
Sodium: 140 (ref 137–147)

## 2019-04-23 LAB — HEMOGLOBIN A1C: Hemoglobin A1C: 11.2

## 2019-05-01 ENCOUNTER — Encounter: Payer: Self-pay | Admitting: Family Medicine

## 2019-05-17 ENCOUNTER — Other Ambulatory Visit: Payer: Self-pay | Admitting: Family Medicine

## 2019-05-17 DIAGNOSIS — N183 Chronic kidney disease, stage 3 unspecified: Secondary | ICD-10-CM

## 2019-05-17 DIAGNOSIS — I739 Peripheral vascular disease, unspecified: Secondary | ICD-10-CM

## 2019-05-17 DIAGNOSIS — I10 Essential (primary) hypertension: Secondary | ICD-10-CM

## 2019-06-01 ENCOUNTER — Ambulatory Visit: Payer: Medicare Other | Admitting: Family Medicine

## 2019-06-02 ENCOUNTER — Telehealth: Payer: Self-pay | Admitting: Internal Medicine

## 2019-06-02 NOTE — Telephone Encounter (Signed)
COVID-19 Pre-Screening Questions:06/02/19  Do you currently have a fever (>100 F), chills or unexplained body aches? NO   Are you currently experiencing new cough, shortness of breath, sore throat, runny nose?NO Have you recently travelled outside the state of [place] in the last 14 days? NO   Have you been in contact with someone that is currently pending confirmation of Covid19 testing or has been confirmed to have the [place] virus? NO  **If the patient answers NO to ALL questions -  advise the patient to please call the clinic before coming to the office should any symptoms develop.

## 2019-06-03 ENCOUNTER — Other Ambulatory Visit: Payer: Self-pay

## 2019-06-03 ENCOUNTER — Ambulatory Visit (INDEPENDENT_AMBULATORY_CARE_PROVIDER_SITE_OTHER): Payer: Medicare Other | Admitting: Internal Medicine

## 2019-06-03 VITALS — BP 135/81 | HR 76 | Temp 97.8°F | Wt 202.0 lb

## 2019-06-03 DIAGNOSIS — B182 Chronic viral hepatitis C: Secondary | ICD-10-CM | POA: Diagnosis not present

## 2019-06-03 NOTE — Progress Notes (Signed)
RFV: follow up for hiv disease Patient ID: Casey Greene, male   DOB: Jul 11, 1951, 68 y.o.   MRN: [id number]  HPI Casey Greene is a 68yo M with multiple comorbidities including Chronic hep c without hepatic coma recently finished treatment this past winter. Due to coronavirus did not get SVR 12 lab work  Outpatient Encounter Medications as of 06/03/2019  Medication Sig  . AMBULATORY NON FORMULARY MEDICATION Patient is suppose to be on blood pressure medication but doesn't remember the name  . Calcium Carbonate-Vit D-Min (CALTRATE 600+D PLUS PO) Take by mouth.  . losartan (COZAAR) 50 MG tablet TAKE 1 TABLET BY MOUTH  DAILY  . vitamin E 400 UNIT capsule Take 400 Units by mouth daily.  Marland Kitchen docusate sodium (COLACE) 100 MG capsule Take 1 capsule (100 mg total) by mouth 2 (two) times daily. (Patient not taking: Reported on 12/24/2018)  . psyllium (METAMUCIL SMOOTH TEXTURE) 58.6 % powder Take 1 packet by mouth 3 (three) times daily. As needed. (Patient not taking: Reported on 06/03/2019)  . trospium (SANCTURA) 20 MG tablet Take 20 mg by mouth 2 (two) times daily.   No facility-administered encounter medications on file as of 06/03/2019.      Patient Active Problem List   Diagnosis Date Noted  . Bleeding per rectum 12/09/2018  . Hypokalemia 12/09/2018  . Screen for colon cancer 12/09/2018  . Constipation 12/01/2018  . Mass of thyroid region 03/28/2018  . Anemia of chronic disease 03/28/2018  . Noncompliance by refusing intervention or support 10/22/2016  . Refusal of medication 10/22/2016  . Edema 07/09/2016  . Depression 02/27/2016  . Prostate cancer (Casey Greene) 01/26/2016  . CKD (chronic kidney disease) stage 3, GFR 30-59 ml/min (HCC) 10/13/2014  . Chronic hepatitis C without hepatic coma ([place]) 07/05/2014  . Hyperlipidemia 06/29/2014  . Essential hypertension 06/09/2014  . Onychomycosis 12/01/2013  . Pain, foot 12/01/2013  . PVD (peripheral vascular disease) ([place]) 12/01/2013     Health Maintenance  Due  Topic Date Due  . TETANUS/TDAP  01/09/1968  . PNA vac Low Risk Adult (1 of 2 - PCV13) 01/08/2014    Social History   Tobacco Use  . Smoking status: Former Smoker    Packs/day: 0.50    Years: 10.00    Pack years: 5.00    Types: Cigarettes    Quit date: 01/11/2006    Years since quitting: 13.4  . Smokeless tobacco: Never Used  Substance Use Topics  . Alcohol use: No  . Drug use: No   Review of Systems Review of Systems  Constitutional: Negative for fever, chills, diaphoresis, activity change, appetite change, fatigue and unexpected weight change.  HENT: Negative for congestion, sore throat, rhinorrhea, sneezing, trouble swallowing and sinus pressure.  Eyes: Negative for photophobia and visual disturbance.  Respiratory: Negative for cough, chest tightness, shortness of breath, wheezing and stridor.  Cardiovascular: Negative for chest pain, palpitations and leg swelling.  Gastrointestinal: Negative for nausea, vomiting, abdominal pain, diarrhea, constipation, blood in stool, abdominal distention and anal bleeding.  Genitourinary: Negative for dysuria, hematuria, flank pain and difficulty urinating.  Musculoskeletal: Negative for myalgias, back pain, joint swelling, arthralgias and gait problem.  Skin: Negative for color change, pallor, rash and wound.  Neurological: Negative for dizziness, tremors, weakness and light-headedness.  Hematological: Negative for adenopathy. Does not bruise/bleed easily.  Psychiatric/Behavioral: Negative for behavioral problems, confusion, sleep disturbance, dysphoric mood, decreased concentration and agitation.    Physical Exam   BP 135/81   Pulse 76   Temp 97.8  F (36.6 C) (Oral)   Wt 202 lb (91.6 kg)   BMI 28.98 kg/m    Physical Exam  Constitutional: He is oriented to person, place, and time. He appears well-developed and well-nourished. No distress.  HENT:  Mouth/Throat: Oropharynx is clear and moist. No oropharyngeal exudate.   Cardiovascular: Normal rate, regular rhythm and normal heart sounds. Exam reveals no gallop and no friction rub.  No murmur heard.  Pulmonary/Chest: Effort normal and breath sounds normal. No respiratory distress. He has no wheezes.  Abdominal: Soft. Bowel sounds are normal. He exhibits no distension. There is no tenderness.  Neurological: He is alert and oriented to person, place, and time.  Skin: Skin is warm and dry. No rash noted. No erythema.  Psychiatric: He has a normal mood and affect. His behavior is normal.    Lab Results  Component Value Date   HEPBSAB NEG 05/24/2010   Lab Results  Component Value Date   LABRPR NON REAC 09/06/2008    CBC Lab Results  Component Value Date   WBC 5.4 12/18/2018   RBC 3.54 (L) 12/18/2018   HGB 11.2 (L) 12/18/2018   HCT 32.7 (L) 12/18/2018   PLT 186.0 12/18/2018   MCV 92.2 12/18/2018   MCH 32.7 06/10/2018   MCHC 34.3 12/18/2018   RDW 13.3 12/18/2018   LYMPHSABS 360 (L) 06/03/2018   MONOABS 0.4 09/08/2016   EOSABS 59 06/03/2018    BMET Lab Results  Component Value Date   NA 140 04/23/2019   K 4.2 04/23/2019   CL 101 12/18/2018   CO2 32 12/18/2018   GLUCOSE 100 (H) 12/18/2018   BUN 56 (A) 04/23/2019   CREATININE 2.6 (A) 04/23/2019   CALCIUM 10.1 12/18/2018   GFRNONAA 24 (L) 05/01/2018   GFRAA 28 (L) 05/01/2018      Assessment and Plan Chronic hepatitis c without hepatic coma = will need SVR 12 (or equivalent) to document cure, thus Will get hcv VL and  Needs repeat imaging for [facility] surveillance  See back in 6 mo

## 2019-06-04 ENCOUNTER — Ambulatory Visit: Payer: Medicare Other | Admitting: Family Medicine

## 2019-06-08 ENCOUNTER — Ambulatory Visit: Payer: Medicare Other | Admitting: Internal Medicine

## 2019-06-10 ENCOUNTER — Ambulatory Visit (HOSPITAL_COMMUNITY)
Admission: RE | Admit: 2019-06-10 | Discharge: 2019-06-10 | Disposition: A | Discharge status: Home / Self Care | Payer: Medicare Other | Source: Ambulatory Visit | Attending: Internal Medicine | Admitting: Internal Medicine

## 2019-06-10 ENCOUNTER — Other Ambulatory Visit: Payer: Self-pay

## 2019-06-10 DIAGNOSIS — B182 Chronic viral hepatitis C: Secondary | ICD-10-CM | POA: Insufficient documentation

## 2019-06-10 LAB — HEPATITIS C RNA QUANTITATIVE
HCV Quantitative Log: <1.18 Log IU/mL
HCV RNA, PCR, QN: <15 IU/mL

## 2019-06-11 ENCOUNTER — Ambulatory Visit (INDEPENDENT_AMBULATORY_CARE_PROVIDER_SITE_OTHER): Payer: Medicare Other | Admitting: Family Medicine

## 2019-06-11 ENCOUNTER — Encounter: Payer: Self-pay | Admitting: Family Medicine

## 2019-06-11 VITALS — BP 142/90 | HR 90 | Ht 70.0 in | Wt 201.4 lb

## 2019-06-11 DIAGNOSIS — R7309 Other abnormal glucose: Secondary | ICD-10-CM | POA: Insufficient documentation

## 2019-06-11 DIAGNOSIS — L84 Corns and callosities: Secondary | ICD-10-CM | POA: Insufficient documentation

## 2019-06-11 DIAGNOSIS — I10 Essential (primary) hypertension: Secondary | ICD-10-CM

## 2019-06-11 DIAGNOSIS — N184 Chronic kidney disease, stage 4 (severe): Secondary | ICD-10-CM | POA: Diagnosis not present

## 2019-06-11 DIAGNOSIS — I129 Hypertensive chronic kidney disease with stage 1 through stage 4 chronic kidney disease, or unspecified chronic kidney disease: Secondary | ICD-10-CM

## 2019-06-11 DIAGNOSIS — I499 Cardiac arrhythmia, unspecified: Secondary | ICD-10-CM

## 2019-06-11 HISTORY — DX: Corns and callosities: L84

## 2019-06-11 HISTORY — DX: Cardiac arrhythmia, unspecified: I49.9

## 2019-06-11 HISTORY — DX: Other abnormal glucose: R73.09

## 2019-06-11 LAB — CBC
HCT: 31.6 % — ABNORMAL LOW (ref 39.0–52.0)
Hemoglobin: 10.8 g/dL — ABNORMAL LOW (ref 13.0–17.0)
MCHC: 34.2 g/dL (ref 30.0–36.0)
MCV: 92.2 fl (ref 78.0–100.0)
Platelets: 186 10*3/uL (ref 150.0–400.0)
RBC: 3.42 Mil/uL — ABNORMAL LOW (ref 4.22–5.81)
RDW: 13.5 % (ref 11.5–15.5)
WBC: 6 10*3/uL (ref 4.0–10.5)

## 2019-06-11 LAB — BASIC METABOLIC PANEL
BUN: 68 mg/dL — ABNORMAL HIGH (ref 6–23)
CO2: 29 mEq/L (ref 19–32)
Calcium: 10.1 mg/dL (ref 8.4–10.5)
Chloride: 101 mEq/L (ref 96–112)
Creatinine, Ser: 2.87 mg/dL — ABNORMAL HIGH (ref 0.40–1.50)
GFR: 26.45 mL/min — ABNORMAL LOW (ref >60.00)
Glucose, Bld: 99 mg/dL (ref 70–99)
Potassium: 3.4 mEq/L — ABNORMAL LOW (ref 3.5–5.1)
Sodium: 140 mEq/L (ref 135–145)

## 2019-06-11 LAB — HEMOGLOBIN A1C: Hgb A1c MFr Bld: 5.8 % (ref 4.6–6.5)

## 2019-06-11 NOTE — Progress Notes (Addendum)
Established Patient Office Visit  Subjective:  Patient ID: Casey Greene, male    DOB: [birth year]  Age: 68 y.o. MRN: [id number]  CC:  Chief Complaint  Patient presents with  . Follow-up    HPI Casey Greene presents for follow-up of his hypertension, CKD and depressed hemoglobin.  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  Review of blood work shows an elevated hemoglobin A1c.  Patient has never had diabetes.  Blood work has always indicated normal glucose levels.  Will recheck hemoglobin A1c today.  Blood pressure has been stable.  He is tolerating the losartan.  He has a callus on his right foot he would like for me to take over care.  Past Medical History:  Diagnosis Date  . Decreased appetite   . Foley catheter in place   . Hepatitis C   . History of gout   . Hypertension   . Prostate cancer ([place])   . Slow heartbeat   . Urinary retention     Past Surgical History:  Procedure Laterality Date  . ACHILLES TENDON REPAIR    . CERVICAL DISC SURGERY     neck  . COLONOSCOPY     before 2010   . LYMPHADENECTOMY Bilateral 01/26/2016   Procedure: EXTENDED BILATERAL PELVIC LYMPHADENECTOMY;  Surgeon: Raynelle Bring, MD;  Location: [facility];  Service: Urology;  Laterality: Bilateral;  . NOSE SURGERY     in high school-nasal fracturex2  . ROBOT ASSISTED LAPAROSCOPIC RADICAL PROSTATECTOMY N/A 01/26/2016   Procedure: XI ROBOTIC ASSISTED LAPAROSCOPIC RADICAL PROSTATECTOMY LEVEL 3;  Surgeon: Raynelle Bring, MD;  Location: [facility];  Service: Urology;  Laterality: N/A;    Family History  Problem Relation Age of Onset  . Heart disease Mother   . Obesity Mother   . Cervical cancer Mother   . Heart disease Father   . Diabetes Father   . ALS Sister   . Colon cancer Neg Hx   . Esophageal cancer Neg Hx   . Colon polyps Neg Hx   . Rectal cancer Neg Hx   . Stomach cancer Neg Hx     Social History   Socioeconomic History  . Marital status: Married    Spouse name: Not on file  . Number of children: Not on  file  . Years of education: Not on file  . Highest education level: Not on file  Occupational History  . Not on file  Social Needs  . Financial resource strain: Not on file  . Food insecurity    Worry: Not on file    Inability: Not on file  . Transportation needs    Medical: Not on file    Non-medical: Not on file  Tobacco Use  . Smoking status: Former Smoker    Packs/day: 0.50    Years: 10.00    Pack years: 5.00    Types: Cigarettes    Quit date: 01/11/2006    Years since quitting: 13.4  . Smokeless tobacco: Never Used  Substance and Sexual Activity  . Alcohol use: No  . Drug use: No  . Sexual activity: Not Currently  Lifestyle  . Physical activity    Days per week: Not on file    Minutes per session: Not on file  . Stress: Not on file  Relationships  . Social Herbalist on phone: Not on file    Gets together: Not on file    Attends religious service: Not on file    Active member of club  or organization: Not on file    Attends meetings of clubs or organizations: Not on file    Relationship status: Not on file  . Intimate partner violence    Fear of current or ex partner: Not on file    Emotionally abused: Not on file    Physically abused: Not on file    Forced sexual activity: Not on file  Other Topics Concern  . Not on file  Social History Narrative  . Not on file    Outpatient Medications Prior to Visit  Medication Sig Dispense Refill  . AMBULATORY NON FORMULARY MEDICATION Patient is suppose to be on blood pressure medication but doesn't remember the name    . Calcium Carbonate-Vit D-Min (CALTRATE 600+D PLUS PO) Take by mouth.    . losartan (COZAAR) 50 MG tablet TAKE 1 TABLET BY MOUTH  DAILY 90 tablet 0  . trospium (SANCTURA) 20 MG tablet Take 20 mg by mouth 2 (two) times daily.    . vitamin E 400 UNIT capsule Take 400 Units by mouth daily.    Marland Kitchen docusate sodium (COLACE) 100 MG capsule Take 1 capsule (100 mg total) by mouth 2 (two) times daily.  (Patient not taking: Reported on 12/24/2018) 100 capsule 0  . psyllium (METAMUCIL SMOOTH TEXTURE) 58.6 % powder Take 1 packet by mouth 3 (three) times daily. As needed. (Patient not taking: Reported on 06/03/2019) 283 g 12   No facility-administered medications prior to visit.     Allergies  Allergen Reactions  . Amlodipine Other (See Comments)    headache  . Tamsulosin Hcl     ROS Review of Systems  Constitutional: Negative.   HENT: Negative.   Eyes: Negative for photophobia and visual disturbance.  Respiratory: Negative.   Cardiovascular: Negative for chest pain and palpitations.  Gastrointestinal: Negative.  Negative for anal bleeding and blood in stool.  Endocrine: Negative for polyphagia and polyuria.  Musculoskeletal: Negative for gait problem and joint swelling.  Skin: Negative for pallor and rash.  Neurological: Negative for seizures and speech difficulty.  Hematological: Does not bruise/bleed easily.  Psychiatric/Behavioral: Negative.       Objective:    Physical Exam  Constitutional: He is oriented to person, place, and time. He appears well-developed and well-nourished. No distress.  HENT:  Head: Normocephalic and atraumatic.  Right Ear: External ear normal.  Left Ear: External ear normal.  Eyes: Conjunctivae are normal. Right eye exhibits no discharge. Left eye exhibits no discharge. No scleral icterus.  Neck: No JVD present. No tracheal deviation present. No thyromegaly present.  Cardiovascular: Normal rate and regular rhythm. Frequent extrasystoles are present.  Pulmonary/Chest: Effort normal and breath sounds normal. No stridor.  Abdominal: Bowel sounds are normal.  Lymphadenopathy:    He has no cervical adenopathy.  Neurological: He is alert and oriented to person, place, and time.  Skin: Skin is warm and dry. He is not diaphoretic.     Psychiatric: He has a normal mood and affect. His behavior is normal.    BP (!) 142/90   Pulse 90   Ht 5\' 10"   (1.778 m)   Wt 201 lb 6 oz (91.3 kg)   SpO2 97%   BMI 28.89 kg/m  Wt Readings from Last 3 Encounters:  06/11/19 201 lb 6 oz (91.3 kg)  06/03/19 202 lb (91.6 kg)  01/15/19 199 lb (90.3 kg)   BP Readings from Last 3 Encounters:  06/11/19 (!) 142/90  06/03/19 135/81  01/28/19 (!) 159/93   Guideline  developer:  UpToDate (see UpToDate for funding source) Date Released: June 2014  Health Maintenance Due  Topic Date Due  . Samul Dada  01/09/1968  . PNA vac Low Risk Adult (1 of 2 - PCV13) 01/08/2014    There are no preventive care reminders to display for this patient.  Lab Results  Component Value Date   TSH 2.72 03/28/2018   Lab Results  Component Value Date   WBC 6.0 06/11/2019   HGB 10.8 (L) 06/11/2019   HCT 31.6 (L) 06/11/2019   MCV 92.2 06/11/2019   PLT 186.0 06/11/2019   Lab Results  Component Value Date   NA 140 06/11/2019   K 3.4 (L) 06/11/2019   CO2 29 06/11/2019   GLUCOSE 99 06/11/2019   BUN 68 (H) 06/11/2019   CREATININE 2.87 (H) 06/11/2019   BILITOT 0.6 06/10/2018   ALKPHOS 54 03/28/2018   AST 32 06/10/2018   ALT 12 06/10/2018   PROT 7.8 06/10/2018   ALBUMIN 4.0 03/28/2018   CALCIUM 10.1 06/11/2019   ANIONGAP 8 09/08/2016   GFR 26.45 (L) 06/11/2019   Lab Results  Component Value Date   CHOL 190 03/28/2018   Lab Results  Component Value Date   HDL 61.90 03/28/2018   Lab Results  Component Value Date   LDLCALC 116 (H) 03/28/2018   Lab Results  Component Value Date   TRIG 61.0 03/28/2018   Lab Results  Component Value Date   CHOLHDL 3 03/28/2018   Lab Results  Component Value Date   HGBA1C 5.8 06/11/2019      Assessment & Plan:   Problem List Items Addressed This Visit      Cardiovascular and Mediastinum   Essential hypertension - Primary   Relevant Orders   Basic metabolic panel (Completed)     Musculoskeletal and Integument   Corn or callus   Relevant Orders   Ambulatory referral to Podiatry     Genitourinary   CKD  (chronic kidney disease) stage 3, GFR 30-59 ml/min (HCC)     Other   Elevated hemoglobin A1c   Relevant Orders   Hemoglobin A1c (Completed)   Irregular heart rate   Relevant Orders   EKG 12-Lead (Completed)   Ambulatory referral to Cardiology    Other Visit Diagnoses    CKD (chronic kidney disease) stage 4, GFR 15-29 ml/min (HCC)       Relevant Orders   CBC (Completed)   Basic metabolic panel (Completed)      No orders of the defined types were placed in this encounter.   Follow-up: Return in about 3 months (around 09/11/2019).

## 2019-06-22 ENCOUNTER — Ambulatory Visit: Payer: Medicare Other | Admitting: Cardiology

## 2019-06-24 ENCOUNTER — Encounter: Payer: Self-pay | Admitting: Cardiology

## 2019-06-24 ENCOUNTER — Ambulatory Visit (INDEPENDENT_AMBULATORY_CARE_PROVIDER_SITE_OTHER): Payer: Medicare Other | Admitting: Cardiology

## 2019-06-24 ENCOUNTER — Other Ambulatory Visit: Payer: Self-pay

## 2019-06-24 VITALS — BP 128/78 | HR 77 | Ht 70.0 in | Wt 207.0 lb

## 2019-06-24 DIAGNOSIS — I499 Cardiac arrhythmia, unspecified: Secondary | ICD-10-CM | POA: Diagnosis not present

## 2019-06-24 DIAGNOSIS — I10 Essential (primary) hypertension: Secondary | ICD-10-CM

## 2019-06-24 DIAGNOSIS — E782 Mixed hyperlipidemia: Secondary | ICD-10-CM | POA: Diagnosis not present

## 2019-06-24 NOTE — Patient Instructions (Signed)
Medication Instructions:  Your physician recommends that you continue on your current medications as directed. Please refer to the Current Medication list given to you today.  If you need a refill on your cardiac medications before your next appointment, please call your pharmacy.   Lab work: Your physician recommends that you return for lab work today: bmp   If you have labs (blood work) drawn today and your tests are completely normal, you will receive your results only by: Marland Kitchen MyChart Message (if you have MyChart) OR . A paper copy in the mail If you have any lab test that is abnormal or we need to change your treatment, we will call you to review the results.  Testing/Procedures: Your physician has recommended that you wear a holter monitor. Holter monitors are medical devices that record the heart's electrical activity. Doctors most often use these monitors to diagnose arrhythmias. Arrhythmias are problems with the speed or rhythm of the heartbeat. The monitor is a small, portable device. You can wear one while you do your normal daily activities. This is usually used to diagnose what is causing palpitations/syncope (passing out). Wear for 7 days   Follow-Up: At [facility], you and your health needs are our priority.  As part of our continuing mission to provide you with exceptional heart care, we have created designated Provider Care Teams.  These Care Teams include your primary Cardiologist (physician) and Advanced Practice Providers (APPs -  Physician Assistants and Nurse Practitioners) who all work together to provide you with the care you need, when you need it. You will need a follow up appointment in 1 months.  Please call our office 2 months in advance to schedule this appointment.  You may see No primary care provider on file. or another member of our [employer] Provider Team in [place]: Shirlee More, MD . Jyl Heinz, MD  Any Other Special Instructions Will Be Listed  Below (If Applicable).

## 2019-06-24 NOTE — Progress Notes (Signed)
Cardiology Consultation:    Date:  06/24/2019   ID:  Casey Greene, DOB [birth year], MRN [id number]  PCP:  Casey Maw, MD  Cardiologist:  Casey Campus, MD   Referring MD: Casey Greene,*   Chief Complaint  Patient presents with  . Irregualr heart rate    History of Present Illness:    Casey Greene is a 68 y.o. male who is being seen today for the evaluation of irregular heartbeats at the request of Casey Greene,*.  Recently he visit that he went to his primary care physician pulse was checked it was felt irregular EKG was done which showed some APCs.  He was referred to [place] for evaluation overall he is completely unaware of this he denies having any palpitation there is no dizziness no syncope.  He described 1 episode of chest pain that happened a few weeks ago when he was sitting started having heavy sensation in the chest lasted for about a minute and then it went away otherwise he is doing fine.  The real problem is the fact that he is developing progressive kidney dysfunction.  He has been taking ARB and recently he the dose of the medication has been increased.  Also he was told to stay better hydrated.  He did have a prostate surgery and after that he got some incontinence and he is avoid drinking too much fluid which undoubtedly contributing to his kidney dysfunction.  He is scheduled to see nephrology team. There is no family history of premature cardiac death or coronary artery disease He tells me that he never had a heart problem  Past Medical History:  Diagnosis Date  . Decreased appetite   . Foley catheter in place   . Hepatitis C   . History of gout   . Hypertension   . Prostate cancer ([place])   . Slow heartbeat   . Urinary retention     Past Surgical History:  Procedure Laterality Date  . ACHILLES TENDON REPAIR    . CERVICAL DISC SURGERY     neck  . COLONOSCOPY     before 2010   . LYMPHADENECTOMY Bilateral 01/26/2016   Procedure:  EXTENDED BILATERAL PELVIC LYMPHADENECTOMY;  Surgeon: Casey Bring, MD;  Location: [facility];  Service: Urology;  Laterality: Bilateral;  . NOSE SURGERY     in high school-nasal fracturex2  . ROBOT ASSISTED LAPAROSCOPIC RADICAL PROSTATECTOMY N/A 01/26/2016   Procedure: XI ROBOTIC ASSISTED LAPAROSCOPIC RADICAL PROSTATECTOMY LEVEL 3;  Surgeon: Casey Bring, MD;  Location: [facility];  Service: Urology;  Laterality: N/A;    Current Medications: Current Meds  Medication Sig  . AMBULATORY NON FORMULARY MEDICATION Patient is suppose to be on blood pressure medication but doesn't remember the name  . APPLE CIDER VINEGAR PO Take by mouth.  . Calcium Carbonate-Vit D-Min (CALTRATE 600+D PLUS PO) Take by mouth.  . losartan (COZAAR) 50 MG tablet TAKE 1 TABLET BY MOUTH  DAILY  . trospium (SANCTURA) 20 MG tablet Take 20 mg by mouth 2 (two) times daily.  . vitamin E 400 UNIT capsule Take 400 Units by mouth daily.     Allergies:   Amlodipine and Tamsulosin hcl   Social History   Socioeconomic History  . Marital status: Married    Spouse name: Not on file  . Number of children: Not on file  . Years of education: Not on file  . Highest education level: Not on file  Occupational History  . Not on file  Social Needs  . Financial resource strain: Not on file  . Food insecurity    Worry: Not on file    Inability: Not on file  . Transportation needs    Medical: Not on file    Non-medical: Not on file  Tobacco Use  . Smoking status: Former Smoker    Packs/day: 0.50    Years: 10.00    Pack years: 5.00    Types: Cigarettes    Quit date: 01/11/2006    Years since quitting: 13.4  . Smokeless tobacco: Never Used  Substance and Sexual Activity  . Alcohol use: No  . Drug use: No  . Sexual activity: Not Currently  Lifestyle  . Physical activity    Days per week: Not on file    Minutes per session: Not on file  . Stress: Not on file  Relationships  . Social Herbalist on phone: Not on file     Gets together: Not on file    Attends religious service: Not on file    Active member of club or organization: Not on file    Attends meetings of clubs or organizations: Not on file    Relationship status: Not on file  Other Topics Concern  . Not on file  Social History Narrative  . Not on file     Family History: The patient's family history includes ALS in his sister; Cervical cancer in his mother; Diabetes in his father; Heart disease in his father and mother; Obesity in his mother. There is no history of Colon cancer, Esophageal cancer, Colon polyps, Rectal cancer, or Stomach cancer. ROS:   Please see the history of present illness.    All 14 point review of systems negative except as described per history of present illness.  EKGs/Labs/Other Studies Reviewed:    The following studies were reviewed today:     Recent Labs: 06/11/2019: BUN 68; Creatinine, Ser 2.87; Hemoglobin 10.8; Platelets 186.0; Potassium 3.4; Sodium 140  Recent Lipid Panel    Component Value Date/Time   CHOL 190 03/28/2018 1206   TRIG 61.0 03/28/2018 1206   HDL 61.90 03/28/2018 1206   CHOLHDL 3 03/28/2018 1206   VLDL 12.2 03/28/2018 1206   LDLCALC 116 (H) 03/28/2018 1206    Physical Exam:    VS:  BP 128/78   Pulse 77   Ht 5\' 10"  (1.778 m)   Wt 207 lb (93.9 kg)   SpO2 98%   BMI 29.70 kg/m     Wt Readings from Last 3 Encounters:  06/24/19 207 lb (93.9 kg)  06/11/19 201 lb 6 oz (91.3 kg)  06/03/19 202 lb (91.6 kg)     GEN:  Well nourished, well developed in no acute distress HEENT: Normal NECK: No JVD; No carotid bruits LYMPHATICS: No lymphadenopathy CARDIAC: RRR, no murmurs, no rubs, no gallops RESPIRATORY:  Clear to auscultation without rales, wheezing or rhonchi  ABDOMEN: Soft, non-tender, non-distended MUSCULOSKELETAL:  No edema; No deformity  SKIN: Warm and dry NEUROLOGIC:  Alert and oriented x 3 PSYCHIATRIC:  Normal affect   ASSESSMENT:    1. Essential hypertension   2.  Mixed hyperlipidemia   3. Irregular heart rate    PLAN:    In order of problems listed above:  1. Essential hypertension.  Blood pressure appears to be well controlled today.  I will not alter his medication however I will ask him to have Chem-7 done if kidney function deteriorated then will switch losartan to a  different medication.  He is scheduled to see nephrology for consultation. 2. Irregular heartbeats.  I will ask him to wear monitor to see if he got any significant arrhythmia.  Future recommendations will be made based on results of the test. 3. Dyslipidemia.  I see last lipid profile from year ago which showed LDL of 116 HDL is 61.  That will need to be repeated.   Medication Adjustments/Labs and Tests Ordered: Current medicines are reviewed at length with the patient today.  Concerns regarding medicines are outlined above.  No orders of the defined types were placed in this encounter.  No orders of the defined types were placed in this encounter.   Signed, Park Liter, MD, [facility]. 06/24/2019 4:48 PM    [place]

## 2019-06-25 LAB — BASIC METABOLIC PANEL
BUN/Creatinine Ratio: 24 (ref 10–24)
BUN: 61 mg/dL — ABNORMAL HIGH (ref 8–27)
CO2: 24 mmol/L (ref 20–29)
Calcium: 9.5 mg/dL (ref 8.6–10.2)
Chloride: 100 mmol/L (ref 96–106)
Creatinine, Ser: 2.49 mg/dL — ABNORMAL HIGH (ref 0.76–1.27)
GFR calc Af Amer: 29 mL/min/{1.73_m2} — ABNORMAL LOW (ref >59)
GFR calc non Af Amer: 25 mL/min/{1.73_m2} — ABNORMAL LOW (ref >59)
Glucose: 96 mg/dL (ref 65–99)
Potassium: 4.1 mmol/L (ref 3.5–5.2)
Sodium: 142 mmol/L (ref 134–144)

## 2019-06-29 ENCOUNTER — Telehealth: Payer: Self-pay | Admitting: Emergency Medicine

## 2019-06-29 NOTE — Telephone Encounter (Signed)
Left message for patient to return call for lab results. 

## 2019-07-01 ENCOUNTER — Ambulatory Visit: Payer: Medicare Other | Admitting: Podiatry

## 2019-07-06 ENCOUNTER — Other Ambulatory Visit (INDEPENDENT_AMBULATORY_CARE_PROVIDER_SITE_OTHER): Payer: Medicare Other

## 2019-07-06 DIAGNOSIS — I499 Cardiac arrhythmia, unspecified: Secondary | ICD-10-CM

## 2019-07-13 ENCOUNTER — Other Ambulatory Visit: Payer: Self-pay | Admitting: Family Medicine

## 2019-07-13 DIAGNOSIS — I739 Peripheral vascular disease, unspecified: Secondary | ICD-10-CM

## 2019-07-13 DIAGNOSIS — N183 Chronic kidney disease, stage 3 unspecified: Secondary | ICD-10-CM

## 2019-07-13 DIAGNOSIS — I10 Essential (primary) hypertension: Secondary | ICD-10-CM

## 2019-07-17 ENCOUNTER — Other Ambulatory Visit: Payer: Self-pay | Admitting: Family Medicine

## 2019-07-17 DIAGNOSIS — I739 Peripheral vascular disease, unspecified: Secondary | ICD-10-CM

## 2019-07-17 DIAGNOSIS — N183 Chronic kidney disease, stage 3 unspecified: Secondary | ICD-10-CM

## 2019-07-17 DIAGNOSIS — I10 Essential (primary) hypertension: Secondary | ICD-10-CM

## 2019-07-23 ENCOUNTER — Ambulatory Visit (INDEPENDENT_AMBULATORY_CARE_PROVIDER_SITE_OTHER): Payer: Medicare Other | Admitting: Cardiology

## 2019-07-23 ENCOUNTER — Encounter: Payer: Self-pay | Admitting: Cardiology

## 2019-07-23 ENCOUNTER — Other Ambulatory Visit: Payer: Self-pay

## 2019-07-23 VITALS — BP 140/100 | HR 80 | Wt 208.0 lb

## 2019-07-23 DIAGNOSIS — E782 Mixed hyperlipidemia: Secondary | ICD-10-CM

## 2019-07-23 DIAGNOSIS — I10 Essential (primary) hypertension: Secondary | ICD-10-CM | POA: Diagnosis not present

## 2019-07-23 DIAGNOSIS — I499 Cardiac arrhythmia, unspecified: Secondary | ICD-10-CM

## 2019-07-23 DIAGNOSIS — I472 Ventricular tachycardia, unspecified: Secondary | ICD-10-CM

## 2019-07-23 DIAGNOSIS — I4729 Other ventricular tachycardia: Secondary | ICD-10-CM

## 2019-07-23 HISTORY — DX: Other ventricular tachycardia: I47.29

## 2019-07-23 HISTORY — DX: Ventricular tachycardia: I47.2

## 2019-07-23 NOTE — Patient Instructions (Signed)
Medication Instructions:  Your physician recommends that you continue on your current medications as directed. Please refer to the Current Medication list given to you today.  If you need a refill on your cardiac medications before your next appointment, please call your pharmacy.   Lab work: None.  If you have labs (blood work) drawn today and your tests are completely normal, you will receive your results only by:  [place] (if you have MyChart) OR  A paper copy in the mail If you have any lab test that is abnormal or we need to change your treatment, we will call you to review the results.  Testing/Procedures: Your physician has requested that you have an echocardiogram. Echocardiography is a painless test that uses sound waves to create images of your heart. It provides your doctor with information about the size and shape of your heart and how well your hearts chambers and valves are working. This procedure takes approximately one hour. There are no restrictions for this procedure.  Your physician has requested that you have a lexiscan myoview. For further information please visit [email or web address]. Please follow instruction sheet, as given.    Follow-Up: At [facility], you and your health needs are our priority.  As part of our continuing mission to provide you with exceptional heart care, we have created designated Provider Care Teams.  These Care Teams include your primary Cardiologist (physician) and Advanced Practice Providers (APPs -  Physician Assistants and Nurse Practitioners) who all work together to provide you with the care you need, when you need it. You will need a follow up appointment in 1 months.  Please call our office 2 months in advance to schedule this appointment.  You may see No primary care provider on file. or another member of our [employer] Provider Team in [place]: Shirlee More, MD  Jyl Heinz, MD  Any Other Special Instructions  Will Be Listed Below (If Applicable).   Echocardiogram An echocardiogram is a procedure that uses painless sound waves (ultrasound) to produce an image of the heart. Images from an echocardiogram can provide important information about:  Signs of coronary artery disease (CAD).  Aneurysm detection. An aneurysm is a weak or damaged part of an artery wall that bulges out from the normal force of blood pumping through the body.  Heart size and shape. Changes in the size or shape of the heart can be associated with certain conditions, including heart failure, aneurysm, and CAD.  Heart muscle function.  Heart valve function.  Signs of a past heart attack.  Fluid buildup around the heart.  Thickening of the heart muscle.  A tumor or infectious growth around the heart valves. Tell a health care provider about:  Any allergies you have.  All medicines you are taking, including vitamins, herbs, eye drops, creams, and over-the-counter medicines.  Any blood disorders you have.  Any surgeries you have had.  Any medical conditions you have.  Whether you are pregnant or may be pregnant. What are the risks? Generally, this is a safe procedure. However, problems may occur, including:  Allergic reaction to dye (contrast) that may be used during the procedure. What happens before the procedure? No specific preparation is needed. You may eat and drink normally. What happens during the procedure?   An IV tube may be inserted into one of your veins.  You may receive contrast through this tube. A contrast is an injection that improves the quality of the pictures from your heart.  A gel will be applied to your chest.  A wand-like tool (transducer) will be moved over your chest. The gel will help to transmit the sound waves from the transducer.  The sound waves will harmlessly bounce off of your heart to allow the heart images to be captured in real-time motion. The images will be recorded  on a computer. The procedure may vary among health care providers and hospitals. What happens after the procedure?  You may return to your normal, everyday life, including diet, activities, and medicines, unless your health care provider tells you not to do that. Summary  An echocardiogram is a procedure that uses painless sound waves (ultrasound) to produce an image of the heart.  Images from an echocardiogram can provide important information about the size and shape of your heart, heart muscle function, heart valve function, and fluid buildup around your heart.  You do not need to do anything to prepare before this procedure. You may eat and drink normally.  After the echocardiogram is completed, you may return to your normal, everyday life, unless your health care provider tells you not to do that. This information is not intended to replace advice given to you by your health care provider. Make sure you discuss any questions you have with your health care provider. Document Released: 11/09/2000 Document Revised: 03/05/2019 Document Reviewed: 12/15/2016 Elsevier Patient Education  2020 [place].    Cardiac Nuclear Scan A cardiac nuclear scan is a test that measures blood flow to the heart when a person is resting and when he or she is exercising. The test looks for problems such as:  Not enough blood reaching a portion of the heart.  The heart muscle not working normally. You may need this test if:  You have heart disease.  You have had abnormal lab results.  You have had heart surgery or a balloon procedure to open up blocked arteries (angioplasty).  You have chest pain.  You have shortness of breath. In this test, a radioactive dye (tracer) is injected into your bloodstream. After the tracer has traveled to your heart, an imaging device is used to measure how much of the tracer is absorbed by or distributed to various areas of your heart. This procedure is usually done  at a hospital and takes 2-4 hours. Tell a health care provider about:  Any allergies you have.  All medicines you are taking, including vitamins, herbs, eye drops, creams, and over-the-counter medicines.  Any problems you or family members have had with anesthetic medicines.  Any blood disorders you have.  Any surgeries you have had.  Any medical conditions you have.  Whether you are pregnant or may be pregnant. What are the risks? Generally, this is a safe procedure. However, problems may occur, including:  Serious chest pain and heart attack. This is only a risk if the stress portion of the test is done.  Rapid heartbeat.  Sensation of warmth in your chest. This usually passes quickly.  Allergic reaction to the tracer. What happens before the procedure?  Ask your health care provider about changing or stopping your regular medicines. This is especially important if you are taking diabetes medicines or blood thinners.  Follow instructions from your health care provider about eating or drinking restrictions.  Remove your jewelry on the day of the procedure. What happens during the procedure?  An IV will be inserted into one of your veins.  Your health care provider will inject a small amount of radioactive  tracer through the IV.  You will wait for 20-40 minutes while the tracer travels through your bloodstream.  Your heart activity will be monitored with an electrocardiogram (ECG).  You will lie down on an exam table.  Images of your heart will be taken for about 15-20 minutes.  You may also have a stress test. For this test, one of the following may be done: ? You will exercise on a treadmill or stationary bike. While you exercise, your heart's activity will be monitored with an ECG, and your blood pressure will be checked. ? You will be given medicines that will increase blood flow to parts of your heart. This is done if you are unable to exercise.  When blood  flow to your heart has peaked, a tracer will again be injected through the IV.  After 20-40 minutes, you will get back on the exam table and have more images taken of your heart.  Depending on the type of tracer used, scans may need to be repeated 3-4 hours later.  Your IV line will be removed when the procedure is over. The procedure may vary among health care providers and hospitals. What happens after the procedure?  Unless your health care provider tells you otherwise, you may return to your normal schedule, including diet, activities, and medicines.  Unless your health care provider tells you otherwise, you may increase your fluid intake. This will help to flush the contrast dye from your body. Drink enough fluid to keep your urine pale yellow.  Ask your health care provider, or the department that is doing the test: ? When will my results be ready? ? How will I get my results? Summary  A cardiac nuclear scan measures the blood flow to the heart when a person is resting and when he or she is exercising.  Tell your health care provider if you are pregnant.  Before the procedure, ask your health care provider about changing or stopping your regular medicines. This is especially important if you are taking diabetes medicines or blood thinners.  After the procedure, unless your health care provider tells you otherwise, increase your fluid intake. This will help flush the contrast dye from your body.  After the procedure, unless your health care provider tells you otherwise, you may return to your normal schedule, including diet, activities, and medicines. This information is not intended to replace advice given to you by your health care provider. Make sure you discuss any questions you have with your health care provider. Document Released: 12/07/2004 Document Revised: 04/28/2018 Document Reviewed: 04/28/2018 Elsevier Patient Education  2020 [employer].

## 2019-07-23 NOTE — Progress Notes (Signed)
Cardiology Office Note:    Date:  07/23/2019   ID:  Casey Greene, DOB August 19, 1951, MRN [id number]  PCP:  Libby Maw, MD  Cardiologist:  [name], MD    Referring MD: Libby Maw,*   Chief Complaint  Patient presents with  . Follow-up    History of Present Illness:    Casey Greene is a 68 y.o. male who was referred to [place] because of irregularity of his heart rate.  He comes today to talk about the results of the test.  He did have a monitor done.  Monitor showed nonsustained ventricular tachycardia.  Again he denies having any dizziness passing out chest pain shortness of breath.  Complain of being tired sometimes but otherwise seems to be doing fine he is kind of surprised with him telling him about the fact that he does have some arrhythmia.  Past Medical History:  Diagnosis Date  . Decreased appetite   . Foley catheter in place   . Hepatitis C   . History of gout   . Hypertension   . Prostate cancer ([place])   . Slow heartbeat   . Urinary retention     Past Surgical History:  Procedure Laterality Date  . ACHILLES TENDON REPAIR    . CERVICAL DISC SURGERY     neck  . COLONOSCOPY     before 2010   . LYMPHADENECTOMY Bilateral 01/26/2016   Procedure: EXTENDED BILATERAL PELVIC LYMPHADENECTOMY;  Surgeon: Raynelle Bring, MD;  Location: [facility];  Service: Urology;  Laterality: Bilateral;  . NOSE SURGERY     in high school-nasal fracturex2  . ROBOT ASSISTED LAPAROSCOPIC RADICAL PROSTATECTOMY N/A 01/26/2016   Procedure: XI ROBOTIC ASSISTED LAPAROSCOPIC RADICAL PROSTATECTOMY LEVEL 3;  Surgeon: Raynelle Bring, MD;  Location: [facility];  Service: Urology;  Laterality: N/A;    Current Medications: Current Meds  Medication Sig  . AMBULATORY NON FORMULARY MEDICATION Patient is suppose to be on blood pressure medication but doesn't remember the name  . APPLE CIDER VINEGAR PO Take by mouth.  . Calcium Carbonate-Vit D-Min (CALTRATE 600+D PLUS PO) Take by mouth.  .  losartan (COZAAR) 50 MG tablet TAKE 1 TABLET BY MOUTH  DAILY  . trospium (SANCTURA) 20 MG tablet Take 20 mg by mouth 2 (two) times daily.  . vitamin E 400 UNIT capsule Take 400 Units by mouth daily.     Allergies:   Amlodipine and Tamsulosin hcl   Social History   Socioeconomic History  . Marital status: Married    Spouse name: Not on file  . Number of children: Not on file  . Years of education: Not on file  . Highest education level: Not on file  Occupational History  . Not on file  Social Needs  . Financial resource strain: Not on file  . Food insecurity    Worry: Not on file    Inability: Not on file  . Transportation needs    Medical: Not on file    Non-medical: Not on file  Tobacco Use  . Smoking status: Former Smoker    Packs/day: 0.50    Years: 10.00    Pack years: 5.00    Types: Cigarettes    Quit date: 01/11/2006    Years since quitting: 13.5  . Smokeless tobacco: Never Used  Substance and Sexual Activity  . Alcohol use: No  . Drug use: No  . Sexual activity: Not Currently  Lifestyle  . Physical activity    Days per week: Not  on file    Minutes per session: Not on file  . Stress: Not on file  Relationships  . Social Herbalist on phone: Not on file    Gets together: Not on file    Attends religious service: Not on file    Active member of club or organization: Not on file    Attends meetings of clubs or organizations: Not on file    Relationship status: Not on file  Other Topics Concern  . Not on file  Social History Narrative  . Not on file     Family History: The patient's family history includes ALS in his sister; Cervical cancer in his mother; Diabetes in his father; Heart disease in his father and mother; Obesity in his mother. There is no history of Colon cancer, Esophageal cancer, Colon polyps, Rectal cancer, or Stomach cancer. ROS:   Please see the history of present illness.    All 14 point review of systems negative except as  described per history of present illness  EKGs/Labs/Other Studies Reviewed:      Recent Labs: 06/11/2019: Hemoglobin 10.8; Platelets 186.0 06/24/2019: BUN 61; Creatinine, Ser 2.49; Potassium 4.1; Sodium 142  Recent Lipid Panel    Component Value Date/Time   CHOL 190 03/28/2018 1206   TRIG 61.0 03/28/2018 1206   HDL 61.90 03/28/2018 1206   CHOLHDL 3 03/28/2018 1206   VLDL 12.2 03/28/2018 1206   LDLCALC 116 (H) 03/28/2018 1206    Physical Exam:    VS:  BP (!) 140/100   Pulse 80   Wt 208 lb (94.3 kg)   SpO2 98%   BMI 29.84 kg/m     Wt Readings from Last 3 Encounters:  07/23/19 208 lb (94.3 kg)  06/24/19 207 lb (93.9 kg)  06/11/19 201 lb 6 oz (91.3 kg)     GEN:  Well nourished, well developed in no acute distress HEENT: Normal NECK: No JVD; No carotid bruits LYMPHATICS: No lymphadenopathy CARDIAC: RRR, no murmurs, no rubs, no gallops RESPIRATORY:  Clear to auscultation without rales, wheezing or rhonchi  ABDOMEN: Soft, non-tender, non-distended MUSCULOSKELETAL:  No edema; No deformity  SKIN: Warm and dry LOWER EXTREMITIES: no swelling NEUROLOGIC:  Alert and oriented x 3 PSYCHIATRIC:  Normal affect   ASSESSMENT:    1. Essential hypertension   2. Ventricular tachycardia ([place])   3. Nonsustained ventricular tachycardia ([place])   4. Mixed hyperlipidemia   5. Irregular heart rate    PLAN:    In order of problems listed above:  1. Essential hypertension blood pressure controlled continue present management. 2. Nonsustained ventricular tachycardia I will schedule him to have an echocardiogram done as well as stress test to stratify this arrhythmia.  Luckily he does not have any dizziness or passing out. 3. Dyslipidemia we will continue present management.  He does have chronic hepatitis C.  Will call primary care physician to get fasting lipid profile.   Medication Adjustments/Labs and Tests Ordered: Current medicines are reviewed at length with the patient today.   Concerns regarding medicines are outlined above.  Orders Placed This Encounter  Procedures  . MYOCARDIAL PERFUSION IMAGING  . ECHOCARDIOGRAM COMPLETE   Medication changes: No orders of the defined types were placed in this encounter.   Signed, Park Liter, MD, [facility] 07/23/2019 12:24 PM    [place]

## 2019-07-24 ENCOUNTER — Ambulatory Visit: Payer: Medicare Other | Admitting: Cardiology

## 2019-07-28 ENCOUNTER — Telehealth (HOSPITAL_COMMUNITY): Payer: Self-pay

## 2019-07-28 NOTE — Telephone Encounter (Signed)
Left instructions for the patient on his answering machine. Asked to call back with any questions. S.Keidan Aumiller EMTP

## 2019-07-29 ENCOUNTER — Other Ambulatory Visit: Payer: Self-pay

## 2019-07-29 ENCOUNTER — Ambulatory Visit (HOSPITAL_BASED_OUTPATIENT_CLINIC_OR_DEPARTMENT_OTHER)
Admission: RE | Admit: 2019-07-29 | Discharge: 2019-07-29 | Disposition: A | Discharge status: Home / Self Care | Payer: Medicare Other | Source: Ambulatory Visit | Attending: Cardiology | Admitting: Cardiology

## 2019-07-29 DIAGNOSIS — I472 Ventricular tachycardia: Secondary | ICD-10-CM

## 2019-07-29 DIAGNOSIS — I77810 Thoracic aortic ectasia: Secondary | ICD-10-CM

## 2019-07-29 HISTORY — DX: Thoracic aortic ectasia: I77.810

## 2019-07-29 NOTE — Progress Notes (Signed)
  Echocardiogram 2D Echocardiogram has been performed.  Cardell Peach 07/29/2019, 11:51 AM

## 2019-07-30 ENCOUNTER — Ambulatory Visit (HOSPITAL_COMMUNITY): Payer: Medicare Other | Attending: Cardiology

## 2019-07-30 VITALS — Ht 70.0 in | Wt 208.0 lb

## 2019-07-30 DIAGNOSIS — Z8249 Family history of ischemic heart disease and other diseases of the circulatory system: Secondary | ICD-10-CM | POA: Diagnosis not present

## 2019-07-30 DIAGNOSIS — R9439 Abnormal result of other cardiovascular function study: Secondary | ICD-10-CM | POA: Diagnosis not present

## 2019-07-30 DIAGNOSIS — I472 Ventricular tachycardia: Secondary | ICD-10-CM | POA: Insufficient documentation

## 2019-07-30 DIAGNOSIS — R5383 Other fatigue: Secondary | ICD-10-CM | POA: Insufficient documentation

## 2019-07-30 DIAGNOSIS — I2510 Atherosclerotic heart disease of native coronary artery without angina pectoris: Secondary | ICD-10-CM | POA: Diagnosis not present

## 2019-07-30 DIAGNOSIS — I4729 Other ventricular tachycardia: Secondary | ICD-10-CM

## 2019-07-30 LAB — MYOCARDIAL PERFUSION IMAGING
LV dias vol: 121 mL (ref 62–150)
LV sys vol: 69 mL
Peak HR: 99 {beats}/min
Rest HR: 69 {beats}/min
SDS: 0
SRS: 0
SSS: 0
TID: 1.07

## 2019-07-30 MED ORDER — TECHNETIUM TC 99M TETROFOSMIN IV KIT
31.5000 | PACK | Freq: Once | INTRAVENOUS | Status: AC | PRN
  Administered 2019-07-30: 31.5 via INTRAVENOUS
  Filled 2019-07-30: qty 32

## 2019-07-30 MED ORDER — TECHNETIUM TC 99M TETROFOSMIN IV KIT
10.4000 | PACK | Freq: Once | INTRAVENOUS | Status: AC | PRN
  Administered 2019-07-30: 10.4 via INTRAVENOUS
  Filled 2019-07-30: qty 11

## 2019-07-30 MED ORDER — REGADENOSON 0.4 MG/5ML IV SOLN
0.4000 mg | Freq: Once | INTRAVENOUS | Status: AC
  Administered 2019-07-30: 0.4 mg via INTRAVENOUS

## 2019-08-04 ENCOUNTER — Telehealth: Payer: Self-pay | Admitting: Family Medicine

## 2019-08-04 DIAGNOSIS — N183 Chronic kidney disease, stage 3 unspecified: Secondary | ICD-10-CM

## 2019-08-04 DIAGNOSIS — I739 Peripheral vascular disease, unspecified: Secondary | ICD-10-CM

## 2019-08-04 DIAGNOSIS — I10 Essential (primary) hypertension: Secondary | ICD-10-CM

## 2019-08-04 MED ORDER — LOSARTAN POTASSIUM 50 MG PO TABS: 50.0000 mg | ORAL_TABLET | Freq: Every day | ORAL | 0 refills | Status: DC

## 2019-08-04 NOTE — Telephone Encounter (Signed)
Copied from [place] [id number]. Topic: Quick Communication - Rx Refill/Question >> Aug 04, 2019  9:10 AM Rainey Pines A wrote: Medication:losartan (COZAAR) 50 MG tablet (Patients wife called and stated that post office lost patients medication sent from [facility] and needs an emergency supply sent to pharmacy. Patient has been without his medication.)  Has the patient contacted their pharmacy? Yes (Agent: If no, request that the patient contact the pharmacy for the refill.) (Agent: If yes, when and what did the pharmacy advise?)Contact PCP  Preferred Pharmacy (with phone number or street name): [id number] - [place], [address] AT [address] [phone] (Phone) [phone] (Fax)    Agent: Please be advised that RX refills may take up to 3 business days. We ask that you follow-up with your pharmacy.

## 2019-08-04 NOTE — Telephone Encounter (Signed)
Per request of pt's wife, Losartan 50 mg. Was ordered for 30 day supply at [facility], in [place], due to mail order shipment getting lost in the mail.

## 2019-08-06 ENCOUNTER — Other Ambulatory Visit: Payer: Self-pay

## 2019-08-06 MED ORDER — LOSARTAN POTASSIUM 50 MG PO TABS: 50.0000 mg | ORAL_TABLET | Freq: Every day | ORAL | 1 refills | Status: DC

## 2019-08-07 NOTE — Telephone Encounter (Signed)
Called and spoke with patient-patient advised he would rather see Dr. Lyndel Safe than have the DOD give instructions/information concerning his care; patient has been scheduled on 08/10/2019 at 4:00pm per patient request; patient advised to call back to the office should questions/concerns arise; patient verbalized understanding of information/instructions;

## 2019-08-10 ENCOUNTER — Encounter: Payer: Self-pay | Admitting: Gastroenterology

## 2019-08-10 ENCOUNTER — Other Ambulatory Visit: Payer: Self-pay

## 2019-08-10 ENCOUNTER — Ambulatory Visit (INDEPENDENT_AMBULATORY_CARE_PROVIDER_SITE_OTHER): Payer: Medicare Other | Admitting: Gastroenterology

## 2019-08-10 ENCOUNTER — Encounter: Payer: Self-pay | Admitting: Family

## 2019-08-10 VITALS — BP 146/94 | HR 80 | Temp 98.5°F | Ht 69.25 in | Wt 211.4 lb

## 2019-08-10 DIAGNOSIS — K625 Hemorrhage of anus and rectum: Secondary | ICD-10-CM

## 2019-08-10 MED ORDER — HYDROCORTISONE (PERIANAL) 2.5 % EX CREA: 1.0000 "application " | TOPICAL_CREAM | Freq: Two times a day (BID) | CUTANEOUS | 0 refills | Status: AC

## 2019-08-10 NOTE — Patient Instructions (Signed)
If you are age 70 or older, your body mass index should be between 23-30. Your Body mass index is 30.99 kg/m. If this is out of the aforementioned range listed, please consider follow up with your Primary Care Provider.  If you are age 91 or younger, your body mass index should be between 19-25. Your Body mass index is 30.99 kg/m. If this is out of the aformentioned range listed, please consider follow up with your Primary Care Provider.   Please purchase the following medications over the counter and take as directed: Metamucil 1 tablespoon once daily.  Colace one tablet once daily.   We have sent the following medications to your pharmacy for you to pick up at your convenience: Anusol Use twice daily per rectum x 10 days.   Thank you,  Dr. Jackquline Denmark

## 2019-08-10 NOTE — Progress Notes (Signed)
Chief Complaint: Rectal bleeding  Referring Provider:  Libby Maw,*      ASSESSMENT AND PLAN;   #1. Rectal bleeding d/t XRT proctitis/hemorrhoids (colon 01/2019) #2. H/O Hep C (treated with Mavyret x 12 weeks 07/2018) geno 2, F3/F4 cirrhosis. ID managing. #3. Anemia of chronic disease d/t CRI.  Plan: - Start metamucil 1 TBS po QD - Colace 1 tablet p.o. once a day. - HC 2.5 % BID PR x 10 days and as needed. - If still with problems, RFA ablation at [facility].  Patient would like to wait. - D/W patient's wife, (she just recently had shoulder Sx. (used to be med Location manager) - FU in 12 weeks.  Earlier, if still with problems.   HPI:    Casey Greene is a 68 y.o. male  For follow-up visit. Had 2 episodes of rectal bleeding Which has stopped now. Was constipated and did have to strain.  Stopped taking Colace on his own. No further bleeding.  Wants to hold off on any invasive testing or RFA.  Wife recently had shoulder surgery and is recovering from the same.  Denies having any abdominal pain.  Colonoscopy 01/28/2019-telangiectasia in the rectum and distal colon consistent with mild radiation proctitis/colitis.  Left colonic diverticulosis, small non-bleeding hemorrhoids.  Past Medical History:  Diagnosis Date  . Decreased appetite   . Foley catheter in place   . Hepatitis C   . History of gout   . Hypertension   . Prostate cancer ([place])   . Slow heartbeat   . Urinary retention     Past Surgical History:  Procedure Laterality Date  . ACHILLES TENDON REPAIR    . CERVICAL DISC SURGERY     neck  . COLONOSCOPY     before 2010   . LYMPHADENECTOMY Bilateral 01/26/2016   Procedure: EXTENDED BILATERAL PELVIC LYMPHADENECTOMY;  Surgeon: Raynelle Bring, MD;  Location: [facility];  Service: Urology;  Laterality: Bilateral;  . NOSE SURGERY     in high school-nasal fracturex2  . ROBOT ASSISTED LAPAROSCOPIC RADICAL PROSTATECTOMY N/A 01/26/2016   Procedure: XI ROBOTIC  ASSISTED LAPAROSCOPIC RADICAL PROSTATECTOMY LEVEL 3;  Surgeon: Raynelle Bring, MD;  Location: [facility];  Service: Urology;  Laterality: N/A;    Family History  Problem Relation Age of Onset  . Heart disease Mother   . Obesity Mother   . Cervical cancer Mother   . Heart disease Father   . Diabetes Father   . ALS Sister   . Colon cancer Neg Hx   . Esophageal cancer Neg Hx   . Colon polyps Neg Hx   . Rectal cancer Neg Hx   . Stomach cancer Neg Hx     Social History   Tobacco Use  . Smoking status: Former Smoker    Packs/day: 0.50    Years: 10.00    Pack years: 5.00    Types: Cigarettes    Quit date: 01/11/2006    Years since quitting: 13.5  . Smokeless tobacco: Never Used  Substance Use Topics  . Alcohol use: No  . Drug use: Not Currently    Current Outpatient Medications  Medication Sig Dispense Refill  . APPLE CIDER VINEGAR PO Take by mouth.    . losartan (COZAAR) 50 MG tablet Take 1 tablet (50 mg total) by mouth daily. 90 tablet 1  . NAPROXEN PO Take 50 mg by mouth as directed.    . solifenacin (VESICARE) 5 MG tablet Take 5 mg by mouth daily.    Marland Kitchen  Calcium Carbonate-Vit D-Min (CALTRATE 600+D PLUS PO) Take by mouth.    . trospium (SANCTURA) 20 MG tablet Take 20 mg by mouth 2 (two) times daily.    . vitamin E 400 UNIT capsule Take 400 Units by mouth daily.     No current facility-administered medications for this visit.     Allergies  Allergen Reactions  . Amlodipine Other (See Comments)    headache  . Tamsulosin Hcl     Review of Systems:  neg     Physical Exam:    BP (!) 146/94   Pulse 80   Temp 98.5 F (36.9 C)   Ht 5' 9.25" (1.759 m)   Wt 211 lb 6 oz (95.9 kg)   BMI 30.99 kg/m  Filed Weights   08/10/19 1626  Weight: 211 lb 6 oz (95.9 kg)   Constitutional:  Well-developed, in no acute distress. Psychiatric: Normal mood and affect. Behavior is normal.  Cardiovascular: Normal rate, regular rhythm. No edema Pulmonary/chest: Effort normal and breath  sounds normal. No wheezing, rales or rhonchi. Abdominal: Soft, nondistended. Nontender. Bowel sounds active throughout. There are no masses palpable. No hepatomegaly.  Well-healed surgical scars.  Anterior 2 cm abdominal wall incisional hernia.  Data Reviewed: I have personally reviewed following labs and imaging studies  CBC: CBC Latest Ref Rng & Units 06/11/2019 12/18/2018 12/09/2018  WBC 4.0 - 10.5 K/uL 6.0 5.4 5.0  Hemoglobin 13.0 - 17.0 g/dL 10.8(L) 11.2(L) 11.8(L)  Hematocrit 39.0 - 52.0 % 31.6(L) 32.7(L) 34.0(L)  Platelets 150.0 - 400.0 K/uL 186.0 186.0 173.0    CMP: CMP Latest Ref Rng & Units 06/24/2019 06/11/2019 04/23/2019  Glucose 65 - 99 mg/dL 96 99 -  BUN 8 - 27 mg/dL 61(H) 68(H) 56(A)  Creatinine 0.76 - 1.27 mg/dL 2.49(H) 2.87(H) 2.6(A)  Sodium 134 - 144 mmol/L 142 140 140  Potassium 3.5 - 5.2 mmol/L 4.1 3.4(L) 4.2  Chloride 96 - 106 mmol/L 100 101 -  CO2 20 - 29 mmol/L 24 29 -  Calcium 8.6 - 10.2 mg/dL 9.5 10.1 -  Total Protein 6.1 - 8.1 g/dL - - -  Total Bilirubin 0.2 - 1.2 mg/dL - - -  Alkaline Phos 39 - 117 U/L - - -  AST 10 - 35 U/L - - -  ALT 9 - 46 U/L - - -   Hepatic Function Latest Ref Rng & Units 06/10/2018 05/01/2018 05/01/2018  Total Protein 6.1 - 8.1 g/dL 7.8 8.1 -  Albumin 3.5 - 5.2 g/dL - - -  AST 10 - 35 U/L 32 60(H) -  ALT 9 - 46 U/L 12 41 41  Alk Phosphatase 39 - 117 U/L - - -  Total Bilirubin 0.2 - 1.2 mg/dL 0.6 0.7 -      Carmell Austria, MD 08/10/2019, 4:44 PM  Cc: Libby Maw

## 2019-08-20 ENCOUNTER — Other Ambulatory Visit: Payer: Self-pay

## 2019-08-20 ENCOUNTER — Encounter: Payer: Self-pay | Admitting: Family

## 2019-08-20 ENCOUNTER — Ambulatory Visit (INDEPENDENT_AMBULATORY_CARE_PROVIDER_SITE_OTHER): Payer: Medicare Other | Admitting: Family

## 2019-08-20 VITALS — BP 144/84 | HR 87 | Ht 69.25 in | Wt 210.0 lb

## 2019-08-20 DIAGNOSIS — E782 Mixed hyperlipidemia: Secondary | ICD-10-CM

## 2019-08-20 DIAGNOSIS — I472 Ventricular tachycardia: Secondary | ICD-10-CM

## 2019-08-20 DIAGNOSIS — N184 Chronic kidney disease, stage 4 (severe): Secondary | ICD-10-CM | POA: Diagnosis not present

## 2019-08-20 DIAGNOSIS — I4729 Other ventricular tachycardia: Secondary | ICD-10-CM

## 2019-08-20 DIAGNOSIS — I119 Hypertensive heart disease without heart failure: Secondary | ICD-10-CM

## 2019-08-20 DIAGNOSIS — I77810 Thoracic aortic ectasia: Secondary | ICD-10-CM | POA: Diagnosis not present

## 2019-08-20 DIAGNOSIS — I10 Essential (primary) hypertension: Secondary | ICD-10-CM | POA: Insufficient documentation

## 2019-08-20 DIAGNOSIS — I491 Atrial premature depolarization: Secondary | ICD-10-CM | POA: Insufficient documentation

## 2019-08-20 MED ORDER — CARVEDILOL 12.5 MG PO TABS: 12.5000 mg | ORAL_TABLET | Freq: Two times a day (BID) | ORAL | 0 refills | Status: DC

## 2019-08-20 NOTE — Patient Instructions (Signed)
Medication Instructions:   START Carvedilol 12.5mg  (one tablet) twice daily.   If you need a refill on your cardiac medications before your next appointment, please call your pharmacy.   Lab work: No lab work today.   If you have labs (blood work) drawn today and your tests are completely normal, you will receive your results only by: Marland Kitchen MyChart Message (if you have MyChart) OR . A paper copy in the mail If you have any lab test that is abnormal or we need to change your treatment, we will call you to review the results.  Testing/Procedures: We reviewed your Stress Test and Echocardiogram (ultrasound of your heart) today.   Follow-Up: At [facility], you and your health needs are our priority.  As part of our continuing mission to provide you with exceptional heart care, we have created designated Provider Care Teams.  These Care Teams include your primary Cardiologist (physician) and Advanced Practice Providers (APPs -  Physician Assistants and Nurse Practitioners) who all work together to provide you with the care you need, when you need it. You will need a follow up appointment in 1 months.  You may see [name], MD or another member of our [place] Provider Team in [place]: Shirlee More, MD . Jyl Heinz, MD . Berniece Salines, MD . [name], NP  Any Other Special Instructions Will Be Listed Below (If Applicable).  Check your blood pressure daily and keep your log. Our goal blood pressure is less than 130/80.   We will reduce your risk of future cardiac events, reduce workload on your heart by maintaining good control of your blood pressure. Guidelines directed therapy based on years of research tells [place] to use medications called ARBs (like Losartan) and beta blockers (like Carvedilol).   Carvedilol tablets What is this medicine? CARVEDILOL (KAR ve dil ol) is a beta-blocker. Beta-blockers reduce the workload on the heart and help it to beat more regularly.  This medicine is used to treat high blood pressure and heart failure. This medicine may be used for other purposes; ask your health care provider or pharmacist if you have questions. COMMON BRAND NAME(S): Coreg What should I tell my health care provider before I take this medicine? They need to know if you have any of these conditions:  circulation problems  diabetes  history of heart attack or heart disease  liver disease  lung or breathing disease, like asthma or emphysema  pheochromocytoma  slow or irregular heartbeat  thyroid disease  an unusual or allergic reaction to carvedilol, other beta-blockers, medicines, foods, dyes, or preservatives  pregnant or trying to get pregnant  breast-feeding How should I use this medicine? Take this medicine by mouth with a glass of water. Follow the directions on the prescription label. It is best to take the tablets with food. Take your doses at regular intervals. Do not take your medicine more often than directed. Do not stop taking except on the advice of your doctor or health care professional. Talk to your pediatrician regarding the use of this medicine in children. Special care may be needed. Overdosage: If you think you have taken too much of this medicine contact a poison control center or emergency room at once. NOTE: This medicine is only for you. Do not share this medicine with others. What if I miss a dose? If you miss a dose, take it as soon as you can. If it is almost time for your next dose, take only that dose.  Do not take double or extra doses. What may interact with this medicine? This medicine may interact with the following medications:  certain medicines for blood pressure, heart disease, irregular heart beat  certain medicines for depression, like fluoxetine or paroxetine  certain medicines for diabetes, like glipizide or glyburide  cimetidine  clonidine  cyclosporine  digoxin  MAOIs like Carbex,  Eldepryl, Marplan, Nardil, and Parnate  reserpine  rifampin This list may not describe all possible interactions. Give your health care provider a list of all the medicines, herbs, non-prescription drugs, or dietary supplements you use. Also tell them if you smoke, drink alcohol, or use illegal drugs. Some items may interact with your medicine. What should I watch for while using this medicine? Check your heart rate and blood pressure regularly while you are taking this medicine. Ask your doctor or health care professional what your heart rate and blood pressure should be, and when you should contact him or her. Do not stop taking this medicine suddenly. This could lead to serious heart-related effects. Contact your doctor or health care professional if you have difficulty breathing while taking this drug. Check your weight daily. Ask your doctor or health care professional when you should notify him/her of any weight gain. You may get drowsy or dizzy. Do not drive, use machinery, or do anything that requires mental alertness until you know how this medicine affects you. To reduce the risk of dizzy or fainting spells, do not sit or stand up quickly. Alcohol can make you more drowsy, and increase flushing and rapid heartbeats. Avoid alcoholic drinks. This medicine may increase blood sugar. Ask your healthcare provider if changes in diet or medicines are needed if you have diabetes. If you are going to have surgery, tell your doctor or health care professional that you are taking this medicine. What side effects may I notice from receiving this medicine? Side effects that you should report to your doctor or health care professional as soon as possible:  allergic reactions like skin rash, itching or hives, swelling of the face, lips, or tongue  breathing problems  dark urine  irregular heartbeat   signs and symptoms of high blood sugar such as being more thirsty or hungry or having to urinate  more than normal. You may also feel very tired or have blurry vision.  swollen legs or ankles  vomiting  yellowing of the eyes or skin Side effects that usually do not require medical attention (report to your doctor or health care professional if they continue or are bothersome):  change in sex drive or performance  diarrhea  dry eyes (especially if wearing contact lenses)  dry, itching skin  headache  nausea  unusually tired This list may not describe all possible side effects. Call your doctor for medical advice about side effects. You may report side effects to FDA at 1-800-FDA-1088. Where should I keep my medicine? Keep out of the reach of children. Store at room temperature below 30 degrees C (86 degrees F). Protect from moisture. Keep container tightly closed. Throw away any unused medicine after the expiration date. NOTE: This sheet is a summary. It may not cover all possible information. If you have questions about this medicine, talk to your doctor, pharmacist, or health care provider.  2020 Elsevier/Gold Standard (2018-09-03 09:13:57)

## 2019-08-20 NOTE — Progress Notes (Signed)
Office Visit    Patient Name: Casey Greene Date of Encounter: 08/20/2019  Primary Care Provider:  Libby Maw, Greene Primary Cardiologist:  Casey Greene Electrophysiologist:  None   Chief Complaint    Casey Greene is a 68 y.o. male with a hx of NSVT, HTN, HLD, chronic hepatitis C presents today for follow up after recent echocardiogram.    Past Medical History    Past Medical History:  Diagnosis Date  . Ascending aorta dilation ([place]) 07/29/2019   (a) Echo 07/29/19 42mm  . CKD (chronic kidney disease), stage IV ([place])   . Decreased appetite   . Foley catheter in place   . Hepatitis C   . History of gout   . Hypertension   . Hypertensive heart disease   . LVH (left ventricular hypertrophy) due to hypertensive disease    (a) echo 07/29/19 severe concentric LVH  . NSVT (nonsustained ventricular tachycardia) ([place])    (a) Zio monitor 06/2019. Subsequent echo with severe concentric LVH, but normal EF 55-60%  . PAC (premature atrial contraction)    (a) ZIO 06/2019. Asymptomatic  . Prostate cancer ([place])   . Slow heartbeat   . Urinary retention    Past Surgical History:  Procedure Laterality Date  . ACHILLES TENDON REPAIR    . CERVICAL DISC SURGERY     neck  . COLONOSCOPY     before 2010   . LYMPHADENECTOMY Bilateral 01/26/2016   Procedure: EXTENDED BILATERAL PELVIC LYMPHADENECTOMY;  Surgeon: Raynelle Bring, Greene;  Location: [facility];  Service: Urology;  Laterality: Bilateral;  . NOSE SURGERY     in high school-nasal fracturex2  . ROBOT ASSISTED LAPAROSCOPIC RADICAL PROSTATECTOMY N/A 01/26/2016   Procedure: XI ROBOTIC ASSISTED LAPAROSCOPIC RADICAL PROSTATECTOMY LEVEL 3;  Surgeon: Raynelle Bring, Greene;  Location: [facility];  Service: Urology;  Laterality: N/A;    Allergies  Allergies  Allergen Reactions  . Amlodipine Other (See Comments)    headache  . Tamsulosin Hcl     History of Present Illness    Casey Greene is a 68 y.o. male with a hx of NSVT, HTN, HLD,  hepatitis C, anemia of chronic disease, CKD4 last seen by Casey Greene 07/23/19.  He was seen in consult for irregular heart rate and subsequent monitor showed PACs, NSVT. He was recommended to undergo echocardiogram and Lexiscan. He is presently being evaluated for worsening kidney function by nephrology.   Echo 07/29/19 with EF 55-60%, severe concentric LVH, impaired relaxation, normal RV, mod to severe dilation of ascending aorta measuring 58mm.   Lexiscan 07/30/19 intermediate risk study due to mild fixed defect in inferior and inferolateral walls, concerning for possile prior infarction given lack reversibility, but normal EF without wall mortion abnormalities on echo.   If he takes medications he gets 130s/90s, if he doesn't take his medications 160s. Tells me he has been taking his blood pressure medications consistently.  His primary complaint is bilateral knee pain.  Tells me he has arthritis and is beginning herbal supplementation for this.  We discussed the results of his echocardiogram and Lexiscan in depth.  He is surprised to find that there is damage to his heart on the Lexi scan with possible prior MI. Tells me he has made changes to his lifestyle in the recent years and no longer drinks nor smokes.  Encouraged him to continue this lifestyle.  Discussed the addition of a beta-blocker for guideline directed therapy.  He is very interested in herbal supplements and I  emphasized that his severe LVH and dilation of ascending aorta required management of hypertension that would require medications.  He is agreeable to trial carvedilol.  He denies chest pain, pressure, tightness.  Reports very intermittent edema that self resolves which he attributes to his arthritis.  Denies shortness of breath, DOE.  His PACs and PSVT are asymptomatic with no palpitations, irregular heartbeat.   EKGs/Labs/Other Studies Reviewed:   The following studies were reviewed today:  ZIO 07/23/19 Baseline rhythm: Sinus    Minimum heart rate: 55 BPM.  Average heart rate: 79 BPM.  Maximal heart rate 2 3 BPM.   Atrial arrhythmia: Total of 6 narrow complex tachycardias fastest episodes 4 beats at rate of 140 bpm, longest episode 13 beats at rate of 114 bpm   Ventricular arrhythmia: Total of 11 wide-complex tachycardia fastest episode 10 beats at rate of 2 3   Conduction abnormality: None   Symptoms: None  Conclusion:  Ventricular tachycardia noted  Lexiscan 07/30/19  Nuclear stress EF: 43%.  There was no ST segment deviation noted during stress.  Defect 1: There is a small defect of mild severity present in the basal inferior, basal inferolateral, mid inferolateral and apex location.  The left ventricular ejection fraction is mildly decreased (45-54%).  This is an intermediate risk study.  No prior study for comparison.   There is a mild fixed defect in the inferior and inferolateral walls. While this may be diaphragmatic attenuation/apical thinning, concern is that there also appears to be mild wall motion abnormality at rest in these areas. This is concerning for possible prior infarction given lack of reversibility. However, echo performed yesterday notes normal EF without wall motion abnormalities per report. Intermediate risk given no reversibility/mild intensity.  Echo 07/29/19  1. The left ventricle has normal systolic function, with an ejection fraction of 55-60%. The cavity size was normal. There is severe concentric left ventricular hypertrophy. Left ventricular diastolic Doppler parameters are consistent with impaired  relaxation. Elevated left ventricular end-diastolic pressure No evidence of left ventricular regional wall motion abnormalities.  2. The right ventricle has normal systolic function. The cavity was normal. There is no increase in right ventricular wall thickness.  3. There is mild mitral annular calcification present  4. There is moderate to severe dilatation of the ascending  aorta measuring 42 mm.  5. The aortic valve is tricuspid. Mild sclerosis of the aortic valve. Aortic valve regurgitation was not assessed by color flow Doppler.  EKG:  No EKG today.  Recent Labs: 06/11/2019: Hemoglobin 10.8; Platelets 186.0 06/24/2019: BUN 61; Creatinine, Ser 2.49; Potassium 4.1; Sodium 142  Recent Lipid Panel    Component Value Date/Time   CHOL 190 03/28/2018 1206   TRIG 61.0 03/28/2018 1206   HDL 61.90 03/28/2018 1206   CHOLHDL 3 03/28/2018 1206   VLDL 12.2 03/28/2018 1206   LDLCALC 116 (H) 03/28/2018 1206    Home Medications   Current Meds  Medication Sig  . APPLE CIDER VINEGAR PO Take 1 tablet by mouth daily.   . Calcium Carbonate-Vit D-Min (CALTRATE 600+D PLUS PO) Take 1 tablet by mouth daily.   . hydrocortisone (ANUSOL-HC) 2.5 % rectal cream Place 1 application rectally 2 (two) times daily for 10 days.  Marland Kitchen losartan (COZAAR) 50 MG tablet Take 1 tablet (50 mg total) by mouth daily.  . solifenacin (VESICARE) 5 MG tablet Take 5 mg by mouth daily.  . trospium (SANCTURA) 20 MG tablet Take 20 mg by mouth 2 (two) times daily.  Marland Kitchen  vitamin E 400 UNIT capsule Take 400 Units by mouth daily.    Review of Systems      Review of Systems  Constitution: Negative for chills, fever and malaise/fatigue.  Cardiovascular: Positive for leg swelling (intermittent). Negative for chest pain, dyspnea on exertion, irregular heartbeat and palpitations.  Respiratory: Negative for cough, shortness of breath and wheezing.   Musculoskeletal: Positive for arthritis and joint pain.  Gastrointestinal: Positive for bloating (known hernia, previous surgery, seeing surgeon next week). Negative for nausea and vomiting.  Neurological: Negative for dizziness, light-headedness and weakness.   All other systems reviewed and are otherwise negative except as noted above.  Physical Exam    VS:  BP (!) 144/84 (BP Location: Left Arm, Patient Position: Sitting, Cuff Size: Normal)   Pulse 87   Ht 5'  9.25" (1.759 m)   Wt 210 lb (95.3 kg)   SpO2 99%   BMI 30.79 kg/m  , BMI Body mass index is 30.79 kg/m. GEN: Well nourished, well developed, in no acute distress. HEENT: normal. Neck: Supple, no JVD, carotid bruits, or masses. Cardiac: RRR, no murmurs, rubs, or gallops. No clubbing, cyanosis, edema.  Radials/DP/PT 2+ and equal bilaterally.  Respiratory:  Respirations regular and unlabored, clear to auscultation bilaterally. GI: Soft, nontender, nondistended, BS + x 4. MS: No deformity or atrophy. Skin: Warm and dry, no rash. Neuro:  Strength and sensation are intact. Psych: Normal affect.  Assessment & Plan    1. NSVT - Noted on ZIO monitor. Subsequent echo with normal EF 55-60%.  Initiate beta-blocker today carvedilol 12.5 mg twice daily. 2. Hypertensive heart disease -blood pressure elevated today.  Tells me blood pressure at home routinely 130/90.  Emphasized goal blood pressure of less than 130/80.  Will initiate guideline directed therapy.  Continue losartan.  Start carvedilol 12.5 mg twice daily. 3. HLD - Lipid profil 04/17/18 with LDL 116.  Will require recheck and likely statin therapy.  He is very hesitant regarding medications as he prefers herbal supplements.  Plan to check lipid profile at next office visit. 4. LVH - Echo 07/29/19 with severe concentric LVH. Likely secondary to HTN. Recommend goal BP <130/80.  Plan as above. 5. Dilation of ascending aorta - Echo 07/29/19 with mod to severe dilation of ascending aorta measuring 44mm.  Changes made today to antihypertensive regimen for optimal BP <130/80.  Plan for repeat imaging in 6 to 12 months by echo or CT; likely by echo due to CKD.  6. [place] with nephrology.   Disposition: Follow up in 4 week(s) with Casey Greene or myself.   Loel Dubonnet, NP 08/20/2019, 1:04 PM

## 2019-08-24 ENCOUNTER — Telehealth: Payer: Self-pay | Admitting: Cardiology

## 2019-08-24 NOTE — Telephone Encounter (Signed)
Called patient. He reports having two episodes of blood in the stool in the past 4 days with red clots. He denies having any current bleeding right now. He denies taking any blood thinners, he denies any pain or other symptoms. Patient advised to called PCP office and to go to the emergency room if symptoms return or get worse. Patient verbally understands.

## 2019-08-24 NOTE — Telephone Encounter (Signed)
Patient called stating  he is having "very thick blood in his urine"  He says he is very worried about it and needs a phone callback asap

## 2019-09-01 DIAGNOSIS — M1990 Unspecified osteoarthritis, unspecified site: Secondary | ICD-10-CM | POA: Diagnosis not present

## 2019-09-01 DIAGNOSIS — N184 Chronic kidney disease, stage 4 (severe): Secondary | ICD-10-CM | POA: Diagnosis not present

## 2019-09-01 DIAGNOSIS — D631 Anemia in chronic kidney disease: Secondary | ICD-10-CM | POA: Diagnosis not present

## 2019-09-01 DIAGNOSIS — N189 Chronic kidney disease, unspecified: Secondary | ICD-10-CM | POA: Diagnosis not present

## 2019-09-01 DIAGNOSIS — N2581 Secondary hyperparathyroidism of renal origin: Secondary | ICD-10-CM | POA: Diagnosis not present

## 2019-09-01 DIAGNOSIS — I129 Hypertensive chronic kidney disease with stage 1 through stage 4 chronic kidney disease, or unspecified chronic kidney disease: Secondary | ICD-10-CM | POA: Diagnosis not present

## 2019-09-17 ENCOUNTER — Ambulatory Visit: Payer: Self-pay | Admitting: General Surgery

## 2019-09-17 DIAGNOSIS — K432 Incisional hernia without obstruction or gangrene: Secondary | ICD-10-CM | POA: Diagnosis not present

## 2019-09-17 NOTE — H&P (Signed)
History of Present Illness Ralene Ok MD; 09/17/2019 11:04 AM) The patient is a 67 year old male who presents with an incisional hernia. Referred by: Dr. Junious Silk Chief Complaint: Incisional hernia  Patient is a 68 year old male with a history of prostate cancer, hypertension, comes in with a incisional hernia. Patient underwent robotic prostatectomy in 2017. Patient states that subsequently he noticed a small bulge the upper portion of his abdomen. He states he has no discomfort. Patient works as a Animal nutritionist. Patient states that he's had no signs or symptoms of incarceration or strangulation. Patient does state he would like to have the hernia repaired.  He's had no previous abdominal surgery aside from the prostatectomy.    Past Surgical History Sabino Gasser, CMA; 09/17/2019 10:48 AM) Foot Surgery  Right.  Diagnostic Studies History Sabino Gasser, CMA; 09/17/2019 10:48 AM) Colonoscopy  1-5 years ago  Allergies Sabino Gasser, CMA; 09/17/2019 10:49 AM) Allergies Reconciled   Medication History Sabino Gasser, CMA; 09/17/2019 10:50 AM) Carvedilol (12.5MG  Tablet, Oral) Active. Cinacalcet HCl (30MG  Tablet, Oral) Active. Losartan Potassium (50MG  Tablet, Oral) Active. Vitamin D (Ergocalciferol) (1.25 MG(50000 UT) Capsule, Oral) Active. Medications Reconciled  Social History Sabino Gasser, CMA; 09/17/2019 10:48 AM) Alcohol use  Occasional alcohol use. No caffeine use  No drug use  Tobacco use  Former smoker.  Family History Sabino Gasser, [place]; 09/17/2019 10:48 AM) Cerebrovascular Accident  Father. Cervical Cancer  Mother. Depression  Mother. Heart Disease  Father. Hypertension  Father, Mother.  Other Problems Sabino Gasser, CMA; 09/17/2019 10:48 AM) Arthritis  Hepatitis  High blood pressure     Review of Systems Ralene Ok MD; 09/17/2019 11:03 AM) General Not Present- Appetite Loss, Chills, Fatigue, Fever, Night Sweats,  Weight Gain and Weight Loss. HEENT Present- Wears glasses/contact lenses. Not Present- Earache, Hearing Loss, Hoarseness, Nose Bleed, Oral Ulcers, Ringing in the Ears, Seasonal Allergies, Sinus Pain, Sore Throat, Visual Disturbances and Yellow Eyes. Respiratory Not Present- Bloody sputum, Chronic Cough, Difficulty Breathing, Snoring and Wheezing. Breast Not Present- Breast Mass, Breast Pain, Nipple Discharge and Skin Changes. Cardiovascular Present- Swelling of Extremities. Not Present- Chest Pain, Difficulty Breathing Lying Down, Leg Cramps, Palpitations, Rapid Heart Rate and Shortness of Breath. Gastrointestinal Present- Bloody Stool. Not Present- Abdominal Pain, Bloating, Change in Bowel Habits, Chronic diarrhea, Constipation, Difficulty Swallowing, Excessive gas, Gets full quickly at meals, Hemorrhoids, Indigestion, Nausea, Rectal Pain and Vomiting. Male Genitourinary Present- Impotence. Not Present- Blood in Urine, Change in Urinary Stream, Frequency, Nocturia, Painful Urination, Urgency and Urine Leakage. Psychiatric Not Present- Anxiety, Bipolar, Change in Sleep Pattern, Depression, Fearful and Frequent crying. Endocrine Not Present- Cold Intolerance, Excessive Hunger, Hair Changes, Heat Intolerance, Hot flashes and New Diabetes. All other systems negative  Vitals Sabino Gasser CMA; 09/17/2019 10:50 AM) 09/17/2019 10:50 AM Weight: 206.4 lb Height: 69in Body Surface Area: 2.09 m Body Mass Index: 30.48 kg/m  Temp.: 97.26F(Oral)  Pulse: 99 (Regular)  BP: 136/84 (Sitting, Left Arm, Standard)       Physical Exam Ralene Ok MD; 09/17/2019 11:05 AM) The physical exam findings are as follows: Note:Constitutional: No acute distress, conversant, appears stated age  Eyes: Anicteric sclerae, moist conjunctiva, no lid lag  Neck: No thyromegaly, trachea midline, no cervical lymphadenopathy  Lungs: Clear to auscultation biilaterally, normal respiratory  effot  Cardiovascular: regular rate & rhythm, no murmurs, no peripheal edema, pedal pulses 2+  GI: Soft, no masses or hepatosplenomegaly, non-tender to palpation  MSK: Normal gait, no clubbing cyanosis, edema  Skin: No rashes, palpation reveals normal skin  turgor  Psychiatric: Appropriate judgment and insight, oriented to person, place, and time  Abdomen Inspection Hernias - Incisional - Reducible(Well-healed midline incision, upper incisional hernia, reducible).    Assessment & Plan Ralene Ok MD; 09/17/2019 11:07 AM) Fatima Blank HERNIA, WITHOUT OBSTRUCTION OR GANGRENE (K43.2) Impression: Patient is a 68 year old male with history of prostate cancer, hypertension, and incisional hernia. We'll order a CT scan of his abdomen and pelvis to visualize for any further hernias, Swiss cheese defects. if this turns out to be an upper midline incisional hernia to be amenable to robotic versus laparoscopic incisional hernia repair with Mesh.  1. Will proceed to the operative for robotic incisional hernia repair with mesh. This may require retrorectus hernia repair with mesh, versus TAPP Hernia repair with mesh. This will be based on the CT scan. 2. All risks and benefits were discussed with the patient to generally include, but not limited to: infection, bleeding, damage to surrounding structures, acute and chronic nerve pain, and recurrence. Alternatives were offered and described. All questions were answered and the patient voiced understanding of the procedure and wishes to proceed at this point with hernia repair.

## 2019-09-17 NOTE — H&P (Deleted)
Casey Greene is an 68 y.o. male.   Chief Complaint: *** HPI: ***  Past Medical History:  Diagnosis Date  . Ascending aorta dilation ([place]) 07/29/2019   (a) Echo 07/29/19 95mm  . CKD (chronic kidney disease), stage IV ([place])   . Decreased appetite   . Foley catheter in place   . Hepatitis C   . History of gout   . Hypertension   . Hypertensive heart disease   . LVH (left ventricular hypertrophy) due to hypertensive disease    (a) echo 07/29/19 severe concentric LVH  . NSVT (nonsustained ventricular tachycardia) ([place])    (a) Zio monitor 06/2019. Subsequent echo with severe concentric LVH, but normal EF 55-60%  . PAC (premature atrial contraction)    (a) ZIO 06/2019. Asymptomatic  . Prostate cancer ([place])   . Slow heartbeat   . Urinary retention     Past Surgical History:  Procedure Laterality Date  . ACHILLES TENDON REPAIR    . CERVICAL DISC SURGERY     neck  . COLONOSCOPY     before 2010   . LYMPHADENECTOMY Bilateral 01/26/2016   Procedure: EXTENDED BILATERAL PELVIC LYMPHADENECTOMY;  Surgeon: Raynelle Bring, MD;  Location: [facility];  Service: Urology;  Laterality: Bilateral;  . NOSE SURGERY     in high school-nasal fracturex2  . ROBOT ASSISTED LAPAROSCOPIC RADICAL PROSTATECTOMY N/A 01/26/2016   Procedure: XI ROBOTIC ASSISTED LAPAROSCOPIC RADICAL PROSTATECTOMY LEVEL 3;  Surgeon: Raynelle Bring, MD;  Location: [facility];  Service: Urology;  Laterality: N/A;    Family History  Problem Relation Age of Onset  . Heart disease Mother   . Obesity Mother   . Cervical cancer Mother   . Heart disease Father   . Diabetes Father   . ALS Sister   . Colon cancer Neg Hx   . Esophageal cancer Neg Hx   . Colon polyps Neg Hx   . Rectal cancer Neg Hx   . Stomach cancer Neg Hx    Social History:  reports that he quit smoking about 13 years ago. His smoking use included cigarettes. He has a 5.00 pack-year smoking history. He has never used smokeless tobacco. He reports previous drug use. He reports that  he does not drink alcohol.  Allergies:  Allergies  Allergen Reactions  . Amlodipine Other (See Comments)    headache  . Tamsulosin Hcl     (Not in a hospital admission)   No results found for this or any previous visit (from the past 48 hour(s)). No results found.  ROS  There were no vitals taken for this visit. Physical Exam   Assessment/Plan ***  Ralene Ok, MD 09/17/2019, 11:09 AM

## 2019-09-22 ENCOUNTER — Other Ambulatory Visit: Payer: Self-pay | Admitting: General Surgery

## 2019-09-22 ENCOUNTER — Other Ambulatory Visit: Payer: Self-pay | Admitting: Registered"

## 2019-09-22 DIAGNOSIS — K432 Incisional hernia without obstruction or gangrene: Secondary | ICD-10-CM

## 2019-09-22 DIAGNOSIS — Z20822 Contact with and (suspected) exposure to covid-19: Secondary | ICD-10-CM

## 2019-09-24 ENCOUNTER — Encounter: Payer: Self-pay | Admitting: Family Medicine

## 2019-09-24 LAB — NOVEL CORONAVIRUS, NAA: SARS-CoV-2, NAA: NOT DETECTED

## 2019-09-25 ENCOUNTER — Encounter: Payer: Self-pay | Admitting: Family

## 2019-09-29 ENCOUNTER — Other Ambulatory Visit: Payer: Self-pay

## 2019-09-29 ENCOUNTER — Encounter: Payer: Self-pay | Admitting: Cardiology

## 2019-09-29 ENCOUNTER — Telehealth (INDEPENDENT_AMBULATORY_CARE_PROVIDER_SITE_OTHER): Payer: Medicare Other | Admitting: Cardiology

## 2019-09-29 VITALS — BP 120/60 | HR 93 | Ht 69.25 in | Wt 202.8 lb

## 2019-09-29 DIAGNOSIS — I472 Ventricular tachycardia: Secondary | ICD-10-CM

## 2019-09-29 DIAGNOSIS — I4729 Other ventricular tachycardia: Secondary | ICD-10-CM

## 2019-09-29 DIAGNOSIS — I10 Essential (primary) hypertension: Secondary | ICD-10-CM | POA: Diagnosis not present

## 2019-09-29 DIAGNOSIS — I77810 Thoracic aortic ectasia: Secondary | ICD-10-CM

## 2019-09-29 NOTE — Progress Notes (Signed)
Virtual Visit via Video Note   This visit type was conducted due to national recommendations for restrictions regarding the COVID-19 Pandemic (e.g. social distancing) in an effort to limit this patient's exposure and mitigate transmission in our community.  Due to his co-morbid illnesses, this patient is at least at moderate risk for complications without adequate follow up.  This format is felt to be most appropriate for this patient at this time.  All issues noted in this document were discussed and addressed.  A limited physical exam was performed with this format.  Please refer to the patient's chart for his consent to telehealth for [facility].  Evaluation Performed:  Follow-up visit  This visit type was conducted due to national recommendations for restrictions regarding the COVID-19 Pandemic (e.g. social distancing).  This format is felt to be most appropriate for this patient at this time.  All issues noted in this document were discussed and addressed.  No physical exam was performed (except for noted visual exam findings with Video Visits).  Please refer to the patient's chart (MyChart message for video visits and phone note for telephone visits) for the patient's consent to telehealth for [facility].  Date:  09/29/2019  ID: Casey Greene, DOB [birth year], MRN [id number]   Patient Location: [place] [address]   Provider location:   [place] Office  PCP:  Casey Maw, MD  Cardiologist:  [name], MD     Chief Complaint: Doing well just have pain in my knees  History of Present Illness:    Casey Greene is a 68 y.o. male  who presents via audio/video conferencing for a telehealth visit today.  With a past medical history significant for essential hypertension, chronic kidney dysfunction, standing aortic aneurysm, nonsustained ventricular tachycardia noted on the monitor.  We did evaluation trying to stratify  his arrhythmia.  Echocardiogram showed preserved left ventricle ejection fraction, there was low ventricle hypertrophy, stress test showed no evidence of ischemia.  There is some question about potentially inferior wall MI but overall segmental motion rest was normal on top of that echocardiogram showed no problem with inferior wall.  Is doing well denies have any chest pain no tightness no pressure no burning in the chest no palpitations no dizziness.   The patient does not have symptoms concerning for COVID-19 infection (fever, chills, cough, or new SHORTNESS OF BREATH).    Prior CV studies:   The following studies were reviewed today:  Mild echocardiogram and stress test reviewed.     Past Medical History:  Diagnosis Date   Ascending aorta dilation ([place]) 07/29/2019   (a) Echo 07/29/19 46mm   CKD (chronic kidney disease), stage IV ([facility])    Decreased appetite    Foley catheter in place    Hepatitis C    History of gout    Hypertension    Hypertensive heart disease    LVH (left ventricular hypertrophy) due to hypertensive disease    (a) echo 07/29/19 severe concentric LVH   NSVT (nonsustained ventricular tachycardia) ([place])    (a) Zio monitor 06/2019. Subsequent echo with severe concentric LVH, but normal EF 55-60%   PAC (premature atrial contraction)    (a) ZIO 06/2019. Asymptomatic   Prostate cancer ([place])    Slow heartbeat    Urinary retention     Past Surgical History:  Procedure Laterality Date   ACHILLES TENDON REPAIR     CERVICAL DISC SURGERY  neck   COLONOSCOPY     before 2010    LYMPHADENECTOMY Bilateral 01/26/2016   Procedure: EXTENDED BILATERAL PELVIC LYMPHADENECTOMY;  Surgeon: Raynelle Bring, MD;  [facility];  Service: Urology;  Laterality: Bilateral;   NOSE SURGERY     in high school-nasal fracturex2   ROBOT ASSISTED LAPAROSCOPIC RADICAL PROSTATECTOMY N/A 01/26/2016   Procedure: XI ROBOTIC ASSISTED LAPAROSCOPIC RADICAL PROSTATECTOMY LEVEL  3;  Surgeon: Raynelle Bring, MD;  [facility];  Service: Urology;  Laterality: N/A;     Current Meds  Medication Sig   APPLE CIDER VINEGAR PO Take 1 tablet by mouth daily.    Calcium Carbonate-Vit D-Min (CALTRATE 600+D PLUS PO) Take 1 tablet by mouth daily.    carvedilol (COREG) 12.5 MG tablet Take 1 tablet (12.5 mg total) by mouth 2 (two) times daily.   cholecalciferol (VITAMIN D3) 25 MCG (1000 UT) tablet Take 1,000 Units by mouth daily.   ferrous sulfate 325 (65 FE) MG EC tablet Take 325 mg by mouth 2 (two) times daily with a meal.   losartan (COZAAR) 50 MG tablet Take 1 tablet (50 mg total) by mouth daily.   psyllium (REGULOID) 0.52 g capsule Take 0.52 g by mouth daily.   vitamin E 400 UNIT capsule Take 400 Units by mouth daily.      Family History: The patient's family history includes ALS in his sister; Cervical cancer in his mother; Diabetes in his father; Heart disease in his father and mother; Obesity in his mother. There is no history of Colon cancer, Esophageal cancer, Colon polyps, Rectal cancer, or Stomach cancer.   ROS:   Please see the history of present illness.     All other systems reviewed and are negative.   Labs/Other Tests and Data Reviewed:     Recent Labs: 06/11/2019: Hemoglobin 10.8; Platelets 186.0 06/24/2019: BUN 61; Creatinine, Ser 2.49; Potassium 4.1; Sodium 142  Recent Lipid Panel    Component Value Date/Time   CHOL 190 03/28/2018 1206   TRIG 61.0 03/28/2018 1206   HDL 61.90 03/28/2018 1206   CHOLHDL 3 03/28/2018 1206   VLDL 12.2 03/28/2018 1206   LDLCALC 116 (H) 03/28/2018 1206      Exam:    Vital Signs:  BP 120/60    Pulse 93    Ht 5' 9.25" (1.759 m)    Wt 202 lb 12.8 oz (92 kg)    SpO2 98%    BMI 29.73 kg/m     Wt Readings from Last 3 Encounters:  09/29/19 202 lb 12.8 oz (92 kg)  08/20/19 210 lb (95.3 kg)  08/10/19 211 lb 6 oz (95.9 kg)     Well nourished, well developed in no acute distress. Alert awake and x3 not in  any distress I am talking to him through the video link.  He came to my office however because of quarantine I am working from home.  He was not in any distress doing well  Diagnosis for this visit:   1. Essential hypertension   2. Nonsustained ventricular tachycardia ([place])   3. Ascending aorta dilation ([facility])      ASSESSMENT & PLAN:    1.  Essential hypertension blood pressure at home 120/70.  We will continue present management. 2.  Nonsustained ventricular tachycardia asymptomatic.  Stress test negative echocardiogram preserved ejection fraction. 3.  Ascending aortic dilatation we will continue monitoring and will keep his blood pressure at reasonable level.  COVID-19 Education: The signs and symptoms of COVID-19 were discussed  with the patient and how to seek care for testing (follow up with PCP or arrange E-visit).  The importance of social distancing was discussed today.  Patient Risk:   After full review of this patients clinical status, I feel that they are at least moderate risk at this time.  Time:   Today, I have spent 5 minutes with the patient with telehealth technology discussing pt health issues.  I spent 18 minutes reviewing her chart before the visit.  Visit was finished at 4:35 PM.    Medication Adjustments/Labs and Tests Ordered: Current medicines are reviewed at length with the patient today.  Concerns regarding medicines are outlined above.  No orders of the defined types were placed in this encounter.  Medication changes: No orders of the defined types were placed in this encounter.    Disposition: Follow-up in 3 months  Signed, Park Liter, MD, [facility] 09/29/2019 4:34 PM    [place]

## 2019-09-29 NOTE — Patient Instructions (Signed)
Medication Instructions:  Your physician recommends that you continue on your current medications as directed. Please refer to the Current Medication list given to you today  *If you need a refill on your cardiac medications before your next appointment, please call your pharmacy*  Lab Work: None.  If you have labs (blood work) drawn today and your tests are completely normal, you will receive your results only by: Marland Kitchen MyChart Message (if you have MyChart) OR . A paper copy in the mail If you have any lab test that is abnormal or we need to change your treatment, we will call you to review the results.  Testing/Procedures: None.   Follow-Up: At [facility], you and your health needs are our priority.  As part of our continuing mission to provide you with exceptional heart care, we have created designated Provider Care Teams.  These Care Teams include your primary Cardiologist (physician) and Advanced Practice Providers (APPs -  Physician Assistants and Nurse Practitioners) who all work together to provide you with the care you need, when you need it.  Your next appointment:   3 months  The format for your next appointment:   In Person  Provider:   You may see [name], MD  or the following Advanced Practice Provider on your designated Care Team:    [name], FNP   Other Instructions

## 2019-10-01 ENCOUNTER — Other Ambulatory Visit: Payer: Medicare Other

## 2019-10-01 ENCOUNTER — Ambulatory Visit
Admission: RE | Admit: 2019-10-01 | Discharge: 2019-10-01 | Disposition: A | Discharge status: Home / Self Care | Payer: Medicare Other | Source: Ambulatory Visit | Attending: General Surgery | Admitting: General Surgery

## 2019-10-01 DIAGNOSIS — K432 Incisional hernia without obstruction or gangrene: Secondary | ICD-10-CM

## 2019-10-07 ENCOUNTER — Other Ambulatory Visit: Payer: Self-pay

## 2019-10-07 DIAGNOSIS — Z20822 Contact with and (suspected) exposure to covid-19: Secondary | ICD-10-CM

## 2019-10-09 LAB — NOVEL CORONAVIRUS, NAA: SARS-CoV-2, NAA: NOT DETECTED

## 2019-10-26 ENCOUNTER — Encounter: Payer: Self-pay | Admitting: Gastroenterology

## 2019-10-26 ENCOUNTER — Ambulatory Visit (INDEPENDENT_AMBULATORY_CARE_PROVIDER_SITE_OTHER): Payer: Medicare Other | Admitting: Gastroenterology

## 2019-10-26 ENCOUNTER — Other Ambulatory Visit: Payer: Self-pay

## 2019-10-26 VITALS — BP 148/90 | HR 76 | Temp 97.9°F | Ht 69.75 in | Wt 204.4 lb

## 2019-10-26 DIAGNOSIS — K625 Hemorrhage of anus and rectum: Secondary | ICD-10-CM

## 2019-10-26 DIAGNOSIS — D638 Anemia in other chronic diseases classified elsewhere: Secondary | ICD-10-CM | POA: Diagnosis not present

## 2019-10-26 DIAGNOSIS — Z8619 Personal history of other infectious and parasitic diseases: Secondary | ICD-10-CM | POA: Diagnosis not present

## 2019-10-26 MED ORDER — HYDROCORTISONE (PERIANAL) 2.5 % EX CREA: 1.0000 "application " | TOPICAL_CREAM | Freq: Two times a day (BID) | CUTANEOUS | 6 refills | Status: DC | PRN

## 2019-10-26 NOTE — Patient Instructions (Addendum)
If you are age 70 or older, your body mass index should be between 23-30. Your Body mass index is 29.54 kg/m. If this is out of the aforementioned range listed, please consider follow up with your Primary Care Provider.  If you are age 87 or younger, your body mass index should be between 19-25. Your Body mass index is 29.54 kg/m. If this is out of the aformentioned range listed, please consider follow up with your Primary Care Provider.   Please purchase the following medications over the counter and take as directed: Metamucil 1 tablespoon once daily Colace one daily   We have sent the following medications to your pharmacy for you to pick up at your convenience: Anusol   Please go to the lab at [facility] ([place] You will need to go to level "B", you do not need an appointment for this. Hours available are 7:30 am - 4:30 pm.   Make yourself a follow up appointment with [facility].   Follow up in 6 months  Thank you,  Dr. Jackquline Denmark

## 2019-10-26 NOTE — Progress Notes (Signed)
Chief Complaint: Rectal bleeding  Referring Provider:  Libby Maw,*      ASSESSMENT AND PLAN;   #1. Rectal bleeding d/t XRT proctitis/hemorrhoids (colon 01/2019) #2. H/O Hep C (treated with Mavyret x 12 weeks 07/2018) geno 2, F3/F4 cirrhosis. ID managing. #3. Anemia of chronic disease d/t CRI. Followed by [facility].  Plan: - Continue metamucil 1 TBS po QD - Start Colace 1 tablet p.o. once a day. - HC 2.5 % BID PR x 14 days and as needed.  6 refills. - If still with problems, RFA ablation at [facility].  Patient would like to wait. - CBC, CMP (Copy to nephrology) - He will make appt with [facility] for follow-up visit. - FU in 6 months.  Earlier, if with problems.   HPI:    Casey Greene is a 68 y.o. male  For follow-up visit. Had 2 episodes of rectal bleeding when he got constipated.  He has been taking Metamucil every day.  He did not take Colace. No further bleeding. This was bright red in color.  Denies having any abdominal pain.  No weight loss.  No fever or chills.  Wants to hold off on RFA as he wants to avoid going to [facility] (due to COVID-19 outbreak)  Wife recently had shoulder surgery and is recovering from the same.  She is a Surveyor, mining.  Denies having any abdominal pain.  Colonoscopy 01/28/2019-telangiectasia in the rectum and distal colon consistent with mild radiation proctitis/colitis.  Left colonic diverticulosis, small non-bleeding hemorrhoids.  Past Medical History:  Diagnosis Date  . Ascending aorta dilation ([place]) 07/29/2019   (a) Echo 07/29/19 78m  . CKD (chronic kidney disease), stage IV ([place]   . Decreased appetite   . Foley catheter in place   . Hepatitis C   . History of gout   . Hypertension   . Hypertensive heart disease   . LVH (left ventricular hypertrophy) due to hypertensive disease    (a) echo 07/29/19 severe concentric LVH  . NSVT (nonsustained ventricular tachycardia) ([place]    (a) Zio monitor 06/2019.  Subsequent echo with severe concentric LVH, but normal EF 55-60%  . PAC (premature atrial contraction)    (a) ZIO 06/2019. Asymptomatic  . Prostate cancer ([place]   . Slow heartbeat   . Urinary retention     Past Surgical History:  Procedure Laterality Date  . ACHILLES TENDON REPAIR    . CERVICAL DISC SURGERY     neck  . COLONOSCOPY     before 2010   . LYMPHADENECTOMY Bilateral 01/26/2016   Procedure: EXTENDED BILATERAL PELVIC LYMPHADENECTOMY;  Surgeon: LRaynelle Bring MD;  [facility];  Service: Urology;  Laterality: Bilateral;  . NOSE SURGERY     in high school-nasal fracturex2  . ROBOT ASSISTED LAPAROSCOPIC RADICAL PROSTATECTOMY N/A 01/26/2016   Procedure: XI ROBOTIC ASSISTED LAPAROSCOPIC RADICAL PROSTATECTOMY LEVEL 3;  Surgeon: LRaynelle Bring MD;  [facility];  Service: Urology;  Laterality: N/A;    Family History  Problem Relation Age of Onset  . Heart disease Mother   . Obesity Mother   . Cervical cancer Mother   . Heart disease Father   . Diabetes Father   . ALS Sister   . Colon cancer Neg Hx   . Esophageal cancer Neg Hx   . Colon polyps Neg Hx   . Rectal cancer Neg Hx   . Stomach cancer Neg Hx     Social History   Tobacco Use  .  Smoking status: Former Smoker    Packs/day: 0.50    Years: 10.00    Pack years: 5.00    Types: Cigarettes    Quit date: 01/11/2006    Years since quitting: 13.7  . Smokeless tobacco: Never Used  Substance Use Topics  . Alcohol use: No  . Drug use: Not Currently    Current Outpatient Medications  Medication Sig Dispense Refill  . APPLE CIDER VINEGAR PO Take 1 tablet by mouth daily.     . Calcium Carbonate-Vit D-Min (CALTRATE 600+D PLUS PO) Take 1 tablet by mouth daily.     . cholecalciferol (VITAMIN D3) 25 MCG (1000 UT) tablet Take 1,000 Units by mouth daily.    . ferrous sulfate 325 (65 FE) MG EC tablet Take 325 mg by mouth 2 (two) times daily with a meal.    . losartan (COZAAR) 50 MG tablet Take 1 tablet (50 mg total) by  mouth daily. 90 tablet 1  . Multiple Vitamins-Minerals (ZINC PO) Take 1 tablet by mouth daily.    . psyllium (REGULOID) 0.52 g capsule Take 0.52 g by mouth daily.    . vitamin E 400 UNIT capsule Take 400 Units by mouth daily.    . carvedilol (COREG) 12.5 MG tablet Take 1 tablet (12.5 mg total) by mouth 2 (two) times daily. (Patient not taking: Reported on 10/26/2019) 180 tablet 0   No current facility-administered medications for this visit.     Allergies  Allergen Reactions  . Amlodipine Other (See Comments)    headache  . Tamsulosin Hcl     Review of Systems:  neg     Physical Exam:    BP (!) 148/90   Pulse 76   Temp 97.9 F (36.6 C)   Ht 5' 9.75" (1.772 m)   Wt 204 lb 6 oz (92.7 kg)   BMI 29.54 kg/m  Filed Weights   10/26/19 1554  Weight: 204 lb 6 oz (92.7 kg)   Constitutional:  Well-developed, in no acute distress. Psychiatric: Normal mood and affect. Behavior is normal.  Cardiovascular: Normal rate, regular rhythm. No edema Pulmonary/chest: Effort normal and breath sounds normal. No wheezing, rales or rhonchi. Abdominal: Soft, nondistended. Nontender. Bowel sounds active throughout. There are no masses palpable. No hepatomegaly.  Well-healed surgical scars.  Anterior 2 cm abdominal wall incisional hernia.  Data Reviewed: I have personally reviewed following labs and imaging studies  CBC: CBC Latest Ref Rng & Units 06/11/2019 12/18/2018 12/09/2018  WBC 4.0 - 10.5 K/uL 6.0 5.4 5.0  Hemoglobin 13.0 - 17.0 g/dL 10.8(L) 11.2(L) 11.8(L)  Hematocrit 39.0 - 52.0 % 31.6(L) 32.7(L) 34.0(L)  Platelets 150.0 - 400.0 K/uL 186.0 186.0 173.0    CMP: CMP Latest Ref Rng & Units 06/24/2019 06/11/2019 04/23/2019  Glucose 65 - 99 mg/dL 96 99 -  BUN 8 - 27 mg/dL 61(H) 68(H) 56(A)  Creatinine 0.76 - 1.27 mg/dL 2.49(H) 2.87(H) 2.6(A)  Sodium 134 - 144 mmol/L 142 140 140  Potassium 3.5 - 5.2 mmol/L 4.1 3.4(L) 4.2  Chloride 96 - 106 mmol/L 100 101 -  CO2 20 - 29 mmol/L 24 29 -   Calcium 8.6 - 10.2 mg/dL 9.5 10.1 -  Total Protein 6.1 - 8.1 g/dL - - -  Total Bilirubin 0.2 - 1.2 mg/dL - - -  Alkaline Phos 39 - 117 U/L - - -  AST 10 - 35 U/L - - -  ALT 9 - 46 U/L - - -   Hepatic Function Latest Ref Rng &  Units 06/10/2018 05/01/2018 05/01/2018  Total Protein 6.1 - 8.1 g/dL 7.8 8.1 -  Albumin 3.5 - 5.2 g/dL - - -  AST 10 - 35 U/L 32 60(H) -  ALT 9 - 46 U/L 12 41 41  Alk Phosphatase 39 - 117 U/L - - -  Total Bilirubin 0.2 - 1.2 mg/dL 0.6 0.7 -  25 minutes spent with the patient today. Greater than 50% was spent in counseling and coordination of care with the patient     Carmell Austria, MD 10/26/2019, 4:06 PM  Cc: Libby Maw

## 2019-10-27 ENCOUNTER — Other Ambulatory Visit (INDEPENDENT_AMBULATORY_CARE_PROVIDER_SITE_OTHER): Payer: Medicare Other

## 2019-10-27 DIAGNOSIS — Z8619 Personal history of other infectious and parasitic diseases: Secondary | ICD-10-CM | POA: Diagnosis not present

## 2019-10-27 DIAGNOSIS — D638 Anemia in other chronic diseases classified elsewhere: Secondary | ICD-10-CM | POA: Diagnosis not present

## 2019-10-27 DIAGNOSIS — K625 Hemorrhage of anus and rectum: Secondary | ICD-10-CM

## 2019-10-27 LAB — COMPREHENSIVE METABOLIC PANEL
ALT: 6 U/L (ref 0–53)
AST: 15 U/L (ref 0–37)
Albumin: 4.1 g/dL (ref 3.5–5.2)
Alkaline Phosphatase: 63 U/L (ref 39–117)
BUN: 58 mg/dL — ABNORMAL HIGH (ref 6–23)
CO2: 29 mEq/L (ref 19–32)
Calcium: 10.2 mg/dL (ref 8.4–10.5)
Chloride: 103 mEq/L (ref 96–112)
Creatinine, Ser: 2.54 mg/dL — ABNORMAL HIGH (ref 0.40–1.50)
GFR: 30.42 mL/min — ABNORMAL LOW (ref >60.00)
Glucose, Bld: 100 mg/dL — ABNORMAL HIGH (ref 70–99)
Potassium: 3.6 mEq/L (ref 3.5–5.1)
Sodium: 141 mEq/L (ref 135–145)
Total Bilirubin: 0.4 mg/dL (ref 0.2–1.2)
Total Protein: 7.9 g/dL (ref 6.0–8.3)

## 2019-10-27 LAB — CBC WITH DIFFERENTIAL/PLATELET
Basophils Absolute: 0 10*3/uL (ref 0.0–0.1)
Basophils Relative: 0.8 % (ref 0.0–3.0)
Eosinophils Absolute: 0.1 10*3/uL (ref 0.0–0.7)
Eosinophils Relative: 1.6 % (ref 0.0–5.0)
HCT: 31.3 % — ABNORMAL LOW (ref 39.0–52.0)
Hemoglobin: 10.4 g/dL — ABNORMAL LOW (ref 13.0–17.0)
Lymphocytes Relative: 12.3 % (ref 12.0–46.0)
Lymphs Abs: 0.8 10*3/uL (ref 0.7–4.0)
MCHC: 33.3 g/dL (ref 30.0–36.0)
MCV: 91 fl (ref 78.0–100.0)
Monocytes Absolute: 0.3 10*3/uL (ref 0.1–1.0)
Monocytes Relative: 5.4 % (ref 3.0–12.0)
Neutro Abs: 4.9 10*3/uL (ref 1.4–7.7)
Neutrophils Relative %: 79.9 % — ABNORMAL HIGH (ref 43.0–77.0)
Platelets: 181 10*3/uL (ref 150.0–400.0)
RBC: 3.44 Mil/uL — ABNORMAL LOW (ref 4.22–5.81)
RDW: 13.7 % (ref 11.5–15.5)
WBC: 6.1 10*3/uL (ref 4.0–10.5)

## 2019-11-18 ENCOUNTER — Other Ambulatory Visit: Payer: Self-pay | Admitting: Family Medicine

## 2019-11-24 ENCOUNTER — Other Ambulatory Visit: Payer: Self-pay

## 2019-11-24 NOTE — Telephone Encounter (Signed)
Appointment scheduled for pt to come in 12/03/19 pt agrees to keep this appointment

## 2019-12-03 ENCOUNTER — Other Ambulatory Visit: Payer: Self-pay

## 2019-12-03 ENCOUNTER — Encounter: Payer: Self-pay | Admitting: Family Medicine

## 2019-12-03 ENCOUNTER — Ambulatory Visit (INDEPENDENT_AMBULATORY_CARE_PROVIDER_SITE_OTHER): Payer: Medicare HMO | Admitting: Family Medicine

## 2019-12-03 VITALS — BP 140/92 | HR 66 | Temp 97.9°F | Wt 206.4 lb

## 2019-12-03 DIAGNOSIS — I10 Essential (primary) hypertension: Secondary | ICD-10-CM | POA: Diagnosis not present

## 2019-12-03 DIAGNOSIS — N1832 Chronic kidney disease, stage 3b: Secondary | ICD-10-CM | POA: Diagnosis not present

## 2019-12-03 MED ORDER — LOSARTAN POTASSIUM 50 MG PO TABS: 50.0000 mg | ORAL_TABLET | Freq: Every day | ORAL | 1 refills | Status: DC

## 2019-12-03 NOTE — Progress Notes (Signed)
Established Patient Office Visit  Subjective:  Patient ID: Casey Greene, male    DOB: 1951-02-28  Age: 69 y.o. MRN: [id number]  CC:  Chief Complaint  Patient presents with  . Follow-up    f/u on BP refill on medication. No concerns.     HPI Casey Greene presents for refill on his blood pressure medicines.  Blood pressure has been stable on the losartan.  Renal function has varied between stage IV and IIIb disease.  He is taking the losartan daily without issue.  He does not smoke drink alcohol or use illicit drugs.  He is sheltering at home with his wife.  Past Medical History:  Diagnosis Date  . Ascending aorta dilation ([place]) 07/29/2019   (a) Echo 07/29/19 4mm  . CKD (chronic kidney disease), stage IV ([place])   . Decreased appetite   . Foley catheter in place   . Hepatitis C   . History of gout   . Hypertension   . Hypertensive heart disease   . LVH (left ventricular hypertrophy) due to hypertensive disease    (a) echo 07/29/19 severe concentric LVH  . NSVT (nonsustained ventricular tachycardia) ([place])    (a) Zio monitor 06/2019. Subsequent echo with severe concentric LVH, but normal EF 55-60%  . PAC (premature atrial contraction)    (a) ZIO 06/2019. Asymptomatic  . Prostate cancer ([place])   . Slow heartbeat   . Urinary retention     Past Surgical History:  Procedure Laterality Date  . ACHILLES TENDON REPAIR    . CERVICAL DISC SURGERY     neck  . COLONOSCOPY     before 2010   . LYMPHADENECTOMY Bilateral 01/26/2016   Procedure: EXTENDED BILATERAL PELVIC LYMPHADENECTOMY;  Surgeon: Raynelle Bring, MD;  Location: [facility];  Service: Urology;  Laterality: Bilateral;  . NOSE SURGERY     in high school-nasal fracturex2  . ROBOT ASSISTED LAPAROSCOPIC RADICAL PROSTATECTOMY N/A 01/26/2016   Procedure: XI ROBOTIC ASSISTED LAPAROSCOPIC RADICAL PROSTATECTOMY LEVEL 3;  Surgeon: Raynelle Bring, MD;  Location: [facility];  Service: Urology;  Laterality: N/A;    Family History  Problem Relation  Age of Onset  . Heart disease Mother   . Obesity Mother   . Cervical cancer Mother   . Heart disease Father   . Diabetes Father   . ALS Sister   . Colon cancer Neg Hx   . Esophageal cancer Neg Hx   . Colon polyps Neg Hx   . Rectal cancer Neg Hx   . Stomach cancer Neg Hx     Social History   Socioeconomic History  . Marital status: Married    Spouse name: Not on file  . Number of children: Not on file  . Years of education: Not on file  . Highest education level: Not on file  Occupational History  . Not on file  Tobacco Use  . Smoking status: Former Smoker    Packs/day: 0.50    Years: 10.00    Pack years: 5.00    Types: Cigarettes    Quit date: 01/11/2006    Years since quitting: 13.9  . Smokeless tobacco: Never Used  Substance and Sexual Activity  . Alcohol use: No  . Drug use: Not Currently  . Sexual activity: Not Currently  Other Topics Concern  . Not on file  Social History Narrative  . Not on file   Social Determinants of Health   Financial Resource Strain:   . Difficulty of Paying Living Expenses: Not  on file  Food Insecurity:   . Worried About Charity fundraiser in the Last Year: Not on file  . Ran Out of Food in the Last Year: Not on file  Transportation Needs:   . Lack of Transportation (Medical): Not on file  . Lack of Transportation (Non-Medical): Not on file  Physical Activity:   . Days of Exercise per Week: Not on file  . Minutes of Exercise per Session: Not on file  Stress:   . Feeling of Stress : Not on file  Social Connections:   . Frequency of Communication with Friends and Family: Not on file  . Frequency of Social Gatherings with Friends and Family: Not on file  . Attends Religious Services: Not on file  . Active Member of Clubs or Organizations: Not on file  . Attends Archivist Meetings: Not on file  . Marital Status: Not on file  Intimate Partner Violence:   . Fear of Current or Ex-Partner: Not on file  . Emotionally  Abused: Not on file  . Physically Abused: Not on file  . Sexually Abused: Not on file    Outpatient Medications Prior to Visit  Medication Sig Dispense Refill  . APPLE CIDER VINEGAR PO Take 1 tablet by mouth daily.     . Calcium Carbonate-Vit D-Min (CALTRATE 600+D PLUS PO) Take 1 tablet by mouth daily.     . cholecalciferol (VITAMIN D3) 25 MCG (1000 UT) tablet Take 1,000 Units by mouth daily.    . ferrous sulfate 325 (65 FE) MG EC tablet Take 325 mg by mouth 2 (two) times daily with a meal.    . hydrocortisone (ANUSOL-HC) 2.5 % rectal cream Place 1 application rectally 2 (two) times daily as needed for hemorrhoids or anal itching. 30 g 6  . vitamin E 400 UNIT capsule Take 400 Units by mouth daily.    Marland Kitchen losartan (COZAAR) 50 MG tablet TAKE 1 TABLET BY MOUTH  DAILY 90 tablet 0  . Multiple Vitamins-Minerals (ZINC PO) Take 1 tablet by mouth daily.    . psyllium (REGULOID) 0.52 g capsule Take 0.52 g by mouth daily.    . carvedilol (COREG) 12.5 MG tablet Take 1 tablet (12.5 mg total) by mouth 2 (two) times daily. (Patient not taking: Reported on 10/26/2019) 180 tablet 0   No facility-administered medications prior to visit.    Allergies  Allergen Reactions  . Amlodipine Other (See Comments)    headache  . Tamsulosin Hcl     ROS Review of Systems  Constitutional: Negative for diaphoresis, fatigue, fever and unexpected weight change.  Respiratory: Negative.   Cardiovascular: Negative.   Gastrointestinal: Negative.   Endocrine: Negative for polyphagia and polyuria.  Skin: Negative for pallor and rash.  Neurological: Negative for light-headedness and headaches.  Psychiatric/Behavioral: Negative.       Objective:    Physical Exam  Constitutional: He appears well-developed and well-nourished. No distress.  HENT:  Head: Normocephalic and atraumatic.  Left Ear: External ear normal.  Eyes: Conjunctivae are normal. Right eye exhibits no discharge. Left eye exhibits no discharge. No  scleral icterus.  Neck: No JVD present. No tracheal deviation present. No thyromegaly present.  Cardiovascular: Normal rate, regular rhythm and normal heart sounds.  Pulmonary/Chest: Effort normal and breath sounds normal. No stridor.  Abdominal: Bowel sounds are normal. He exhibits no distension. There is no abdominal tenderness. There is no rebound, no guarding and no CVA tenderness. A hernia is present. Hernia confirmed positive in the  ventral area.  Lymphadenopathy:    He has no cervical adenopathy.  Skin: He is not diaphoretic.    BP (!) 140/92   Pulse 66   Temp 97.9 F (36.6 C) (Oral)   Wt 206 lb 6.4 oz (93.6 kg)   SpO2 99%   BMI 29.83 kg/m  Wt Readings from Last 3 Encounters:  12/03/19 206 lb 6.4 oz (93.6 kg)  10/26/19 204 lb 6 oz (92.7 kg)  09/29/19 202 lb 12.8 oz (92 kg)     Health Maintenance Due  Topic Date Due  . PNA vac Low Risk Adult (1 of 2 - PCV13) 01/08/2014    There are no preventive care reminders to display for this patient.  Lab Results  Component Value Date   TSH 2.72 03/28/2018   Lab Results  Component Value Date   WBC 6.1 10/27/2019   HGB 10.4 (L) 10/27/2019   HCT 31.3 (L) 10/27/2019   MCV 91.0 10/27/2019   PLT 181.0 10/27/2019   Lab Results  Component Value Date   NA 141 10/27/2019   K 3.6 10/27/2019   CO2 29 10/27/2019   GLUCOSE 100 (H) 10/27/2019   BUN 58 (H) 10/27/2019   CREATININE 2.54 (H) 10/27/2019   BILITOT 0.4 10/27/2019   ALKPHOS 63 10/27/2019   AST 15 10/27/2019   ALT 6 10/27/2019   PROT 7.9 10/27/2019   ALBUMIN 4.1 10/27/2019   CALCIUM 10.2 10/27/2019   ANIONGAP 8 09/08/2016   GFR 30.42 (L) 10/27/2019   Lab Results  Component Value Date   CHOL 190 03/28/2018   Lab Results  Component Value Date   HDL 61.90 03/28/2018   Lab Results  Component Value Date   LDLCALC 116 (H) 03/28/2018   Lab Results  Component Value Date   TRIG 61.0 03/28/2018   Lab Results  Component Value Date   CHOLHDL 3 03/28/2018    Lab Results  Component Value Date   HGBA1C 5.8 06/11/2019      Assessment & Plan:   Problem List Items Addressed This Visit      Cardiovascular and Mediastinum   Essential hypertension - Primary   Relevant Medications   losartan (COZAAR) 50 MG tablet     Genitourinary   Stage 3b chronic kidney disease      Meds ordered this encounter  Medications  . losartan (COZAAR) 50 MG tablet    Sig: Take 1 tablet (50 mg total) by mouth daily.    Dispense:  90 tablet    Refill:  1    Requesting 1 year supply    Follow-up: No follow-ups on file.  Will see nephrology at the end of this month and then cardiology in 3 months.  We will follow him up in 6 months here.  Encouraged him to hydrate well to support his kidney function.  Libby Maw, MD

## 2019-12-09 ENCOUNTER — Ambulatory Visit: Payer: Medicare Other | Admitting: Internal Medicine

## 2019-12-23 DIAGNOSIS — D631 Anemia in chronic kidney disease: Secondary | ICD-10-CM | POA: Diagnosis not present

## 2019-12-23 DIAGNOSIS — N184 Chronic kidney disease, stage 4 (severe): Secondary | ICD-10-CM | POA: Diagnosis not present

## 2019-12-23 DIAGNOSIS — C61 Malignant neoplasm of prostate: Secondary | ICD-10-CM | POA: Diagnosis not present

## 2019-12-23 DIAGNOSIS — N2581 Secondary hyperparathyroidism of renal origin: Secondary | ICD-10-CM | POA: Diagnosis not present

## 2019-12-23 DIAGNOSIS — N189 Chronic kidney disease, unspecified: Secondary | ICD-10-CM | POA: Diagnosis not present

## 2019-12-23 DIAGNOSIS — I129 Hypertensive chronic kidney disease with stage 1 through stage 4 chronic kidney disease, or unspecified chronic kidney disease: Secondary | ICD-10-CM | POA: Diagnosis not present

## 2019-12-23 LAB — BASIC METABOLIC PANEL
BUN: 34 — AB (ref 4–21)
CO2: 30 — AB (ref 13–22)
Chloride: 104 (ref 99–108)
Creatinine: 2.1 — AB (ref 0.6–1.3)
Glucose: 108
Potassium: 3.8 (ref 3.4–5.3)
Sodium: 143 (ref 137–147)

## 2019-12-23 LAB — COMPREHENSIVE METABOLIC PANEL
Albumin: 4.3 (ref 3.5–5.0)
Calcium: 10.1 (ref 8.7–10.7)
GFR calc Af Amer: 35
GFR calc non Af Amer: 31

## 2019-12-23 LAB — VITAMIN D 25 HYDROXY (VIT D DEFICIENCY, FRACTURES): Vit D, 25-Hydroxy: 55

## 2020-01-07 ENCOUNTER — Encounter: Payer: Self-pay | Admitting: Internal Medicine

## 2020-01-07 ENCOUNTER — Other Ambulatory Visit: Payer: Self-pay

## 2020-01-07 ENCOUNTER — Ambulatory Visit (INDEPENDENT_AMBULATORY_CARE_PROVIDER_SITE_OTHER): Payer: Medicare HMO | Admitting: Internal Medicine

## 2020-01-07 VITALS — Ht 70.0 in | Wt 206.0 lb

## 2020-01-07 DIAGNOSIS — B182 Chronic viral hepatitis C: Secondary | ICD-10-CM | POA: Diagnosis not present

## 2020-01-07 DIAGNOSIS — Z0000 Encounter for general adult medical examination without abnormal findings: Secondary | ICD-10-CM | POA: Diagnosis not present

## 2020-01-07 DIAGNOSIS — I10 Essential (primary) hypertension: Secondary | ICD-10-CM | POA: Diagnosis not present

## 2020-01-07 NOTE — Progress Notes (Signed)
RFV: follow up for treated hep C  Patient ID: Casey Greene, male   DOB: 12/28/1950, 69 y.o.   MRN: [id number]  HPI Casey Greene has hx of hep c, cirrhosis c/b Rectal bleeding d/t XRT proctitis/hemorrhoids (colon 01/2019) #2. H/O Hep C (treated with Mavyret x 12 weeks 07/2018) geno 2, F3/F4 cirrhosis and  Anemia of chronic disease associated with CKD.    Outpatient Encounter Medications as of 01/07/2020  Medication Sig  . APPLE CIDER VINEGAR PO Take 1 tablet by mouth daily.   . Calcium Carbonate-Vit D-Min (CALTRATE 600+D PLUS PO) Take 1 tablet by mouth daily.   . cholecalciferol (VITAMIN D3) 25 MCG (1000 UT) tablet Take 1,000 Units by mouth daily.  . ferrous sulfate 325 (65 FE) MG EC tablet Take 325 mg by mouth 2 (two) times daily with a meal.  . hydrocortisone (ANUSOL-HC) 2.5 % rectal cream Place 1 application rectally 2 (two) times daily as needed for hemorrhoids or anal itching.  . losartan (COZAAR) 50 MG tablet Take 1 tablet (50 mg total) by mouth daily.  . Multiple Vitamins-Minerals (ZINC PO) Take 1 tablet by mouth daily.  . psyllium (REGULOID) 0.52 g capsule Take 0.52 g by mouth daily.  . vitamin E 400 UNIT capsule Take 400 Units by mouth daily.   No facility-administered encounter medications on file as of 01/07/2020.     Patient Active Problem List   Diagnosis Date Noted  . NSVT (nonsustained ventricular tachycardia) ([place])   . CKD (chronic kidney disease), stage IV ([place])   . Hypertension   . PAC (premature atrial contraction)   . LVH (left ventricular hypertrophy) due to hypertensive disease   . Ascending aorta dilation ([facility]) 07/29/2019  . Nonsustained ventricular tachycardia ([place]) 07/23/2019  . Elevated hemoglobin A1c 06/11/2019  . Irregular heart rate 06/11/2019  . Corn or callus 06/11/2019  . Bleeding per rectum 12/09/2018  . Hypokalemia 12/09/2018  . Screen for colon cancer 12/09/2018  . Constipation 12/01/2018  . Mass of thyroid region 03/28/2018  . Anemia of chronic  disease 03/28/2018  . Noncompliance by refusing intervention or support 10/22/2016  . Refusal of medication 10/22/2016  . Edema 07/09/2016  . Depression 02/27/2016  . Prostate cancer ([place]) 01/26/2016  . Stage 3b chronic kidney disease 10/13/2014  . Chronic hepatitis C without hepatic coma ([place]) 07/05/2014  . Hyperlipidemia 06/29/2014  . Essential hypertension 06/09/2014  . Onychomycosis 12/01/2013  . Pain, foot 12/01/2013  . PVD (peripheral vascular disease) ([place]) 12/01/2013     Health Maintenance Due  Topic Date Due  . PNA vac Low Risk Adult (1 of 2 - PCV13) 01/08/2014    Social History   Tobacco Use  . Smoking status: Former Smoker    Packs/day: 0.50    Years: 10.00    Pack years: 5.00    Types: Cigarettes    Quit date: 01/11/2006    Years since quitting: 14.0  . Smokeless tobacco: Never Used  Substance Use Topics  . Alcohol use: No  . Drug use: Not Currently   Review of Systems Review of Systems  Constitutional: Negative for fever, chills, diaphoresis, activity change, appetite change, fatigue and unexpected weight change.  HENT: Negative for congestion, sore throat, rhinorrhea, sneezing, trouble swallowing and sinus pressure.  Eyes: Negative for photophobia and visual disturbance.  Respiratory: Negative for cough, chest tightness, shortness of breath, wheezing and stridor.  Cardiovascular: Negative for chest pain, palpitations and leg swelling.  Gastrointestinal: Negative for nausea, vomiting, abdominal pain, diarrhea, constipation, blood in  stool, abdominal distention and anal bleeding.  Genitourinary: Negative for dysuria, hematuria, flank pain and difficulty urinating.  Musculoskeletal: Negative for myalgias, back pain, joint swelling, arthralgias and gait problem.  Skin: Negative for color change, pallor, rash and wound.  Neurological: Negative for dizziness, tremors, weakness and light-headedness.  Hematological: Negative for adenopathy. Does not bruise/bleed  easily.  Psychiatric/Behavioral: Negative for behavioral problems, confusion, sleep disturbance, dysphoric mood, decreased concentration and agitation.    Physical Exam  Ht 5\' 10"  (1.778 m)   Wt 206 lb (93.4 kg)   SpO2 99%   BMI 29.56 kg/m  Physical Exam  Constitutional: He is oriented to person, place, and time. He appears well-developed and well-nourished. No distress.  HENT:  Mouth/Throat: Oropharynx is clear and moist. No oropharyngeal exudate.  Cardiovascular: Normal rate, regular rhythm and normal heart sounds. Exam reveals no gallop and no friction rub.  No murmur heard.  Pulmonary/Chest: Effort normal and breath sounds normal. No respiratory distress. He has no wheezes.  Abdominal: Soft. Bowel sounds are normal. He exhibits no distension. There is no tenderness.  Lymphadenopathy:  He has no cervical adenopathy.  Neurological: He is alert and oriented to person, place, and time.  Skin: Skin is warm and dry. No rash noted. No erythema.  Psychiatric: He has a normal mood and affect. His behavior is normal.    Lab Results  Component Value Date   HEPBSAB NEG 05/24/2010   Lab Results  Component Value Date   LABRPR NON REAC 09/06/2008    CBC Lab Results  Component Value Date   WBC 6.1 10/27/2019   RBC 3.44 (L) 10/27/2019   HGB 10.4 (L) 10/27/2019   HCT 31.3 (L) 10/27/2019   PLT 181.0 10/27/2019   MCV 91.0 10/27/2019   MCH 32.7 06/10/2018   MCHC 33.3 10/27/2019   RDW 13.7 10/27/2019   LYMPHSABS 0.8 10/27/2019   MONOABS 0.3 10/27/2019   EOSABS 0.1 10/27/2019    BMET Lab Results  Component Value Date   NA 141 10/27/2019   K 3.6 10/27/2019   CL 103 10/27/2019   CO2 29 10/27/2019   GLUCOSE 100 (H) 10/27/2019   BUN 58 (H) 10/27/2019   CREATININE 2.54 (H) 10/27/2019   CALCIUM 10.2 10/27/2019   GFRNONAA 25 (L) 06/24/2019   GFRAA 29 (L) 06/24/2019      Assessment and Plan  Chronic hepatitis C without hepatic coma = completed treatment in 2020, will need  to get Ultrasound for hcc surveillance  HTN = will Recommended to pick up his carvediolol to treat htn rtc in 6 mo  Health maintenance = will need to check hep a ab, hep b ab, to ensure immunized

## 2020-01-12 DIAGNOSIS — I129 Hypertensive chronic kidney disease with stage 1 through stage 4 chronic kidney disease, or unspecified chronic kidney disease: Secondary | ICD-10-CM | POA: Diagnosis not present

## 2020-01-13 ENCOUNTER — Encounter: Payer: Self-pay | Admitting: Family Medicine

## 2020-01-13 ENCOUNTER — Ambulatory Visit (HOSPITAL_COMMUNITY)
Admission: RE | Admit: 2020-01-13 | Discharge: 2020-01-13 | Disposition: A | Discharge status: Home / Self Care | Payer: Medicare HMO | Source: Ambulatory Visit | Attending: Internal Medicine | Admitting: Internal Medicine

## 2020-01-13 ENCOUNTER — Other Ambulatory Visit: Payer: Self-pay

## 2020-01-13 DIAGNOSIS — R413 Other amnesia: Secondary | ICD-10-CM

## 2020-01-13 DIAGNOSIS — K8020 Calculus of gallbladder without cholecystitis without obstruction: Secondary | ICD-10-CM | POA: Diagnosis not present

## 2020-01-13 DIAGNOSIS — B182 Chronic viral hepatitis C: Secondary | ICD-10-CM | POA: Diagnosis not present

## 2020-02-02 ENCOUNTER — Telehealth: Payer: Self-pay | Admitting: Family Medicine

## 2020-02-02 NOTE — Telephone Encounter (Signed)
LVM to schedule AWV with Health Advisor

## 2020-02-05 NOTE — Telephone Encounter (Signed)
error 

## 2020-02-15 ENCOUNTER — Encounter: Payer: Self-pay | Admitting: Family Medicine

## 2020-02-29 ENCOUNTER — Encounter: Payer: Self-pay | Admitting: Family Medicine

## 2020-03-15 DIAGNOSIS — M79673 Pain in unspecified foot: Secondary | ICD-10-CM | POA: Diagnosis not present

## 2020-03-15 DIAGNOSIS — I10 Essential (primary) hypertension: Secondary | ICD-10-CM | POA: Diagnosis not present

## 2020-03-17 DIAGNOSIS — M7752 Other enthesopathy of left foot: Secondary | ICD-10-CM | POA: Diagnosis not present

## 2020-03-17 DIAGNOSIS — M25572 Pain in left ankle and joints of left foot: Secondary | ICD-10-CM | POA: Diagnosis not present

## 2020-04-13 DIAGNOSIS — N2581 Secondary hyperparathyroidism of renal origin: Secondary | ICD-10-CM | POA: Diagnosis not present

## 2020-04-13 DIAGNOSIS — I129 Hypertensive chronic kidney disease with stage 1 through stage 4 chronic kidney disease, or unspecified chronic kidney disease: Secondary | ICD-10-CM | POA: Diagnosis not present

## 2020-04-13 DIAGNOSIS — C61 Malignant neoplasm of prostate: Secondary | ICD-10-CM | POA: Diagnosis not present

## 2020-04-13 DIAGNOSIS — N189 Chronic kidney disease, unspecified: Secondary | ICD-10-CM | POA: Diagnosis not present

## 2020-04-13 DIAGNOSIS — N184 Chronic kidney disease, stage 4 (severe): Secondary | ICD-10-CM | POA: Diagnosis not present

## 2020-04-13 DIAGNOSIS — D631 Anemia in chronic kidney disease: Secondary | ICD-10-CM | POA: Diagnosis not present

## 2020-04-13 LAB — COMPREHENSIVE METABOLIC PANEL
Albumin: 4.1 (ref 3.5–5.0)
Calcium: 9.3 (ref 8.7–10.7)
GFR calc Af Amer: 31
GFR calc non Af Amer: 27

## 2020-04-13 LAB — BASIC METABOLIC PANEL
BUN: 47 — AB (ref 4–21)
CO2: 26 — AB (ref 13–22)
Chloride: 103 (ref 99–108)
Creatinine: 2.4 — AB (ref 0.6–1.3)
Glucose: 117
Potassium: 3.6 (ref 3.4–5.3)
Sodium: 139 (ref 137–147)

## 2020-04-13 LAB — IRON,TIBC AND FERRITIN PANEL
%SAT: 24
Ferritin: 52
Iron: 72
TIBC: 301
UIBC: 229

## 2020-04-13 LAB — CBC AND DIFFERENTIAL: Hemoglobin: 11.7 — AB (ref 13.5–17.5)

## 2020-04-13 LAB — VITAMIN D 25 HYDROXY (VIT D DEFICIENCY, FRACTURES): Vit D, 25-Hydroxy: 24.7

## 2020-04-28 ENCOUNTER — Encounter: Payer: Self-pay | Admitting: Family Medicine

## 2020-04-28 NOTE — Progress Notes (Signed)
[facility]/thx dmf 

## 2020-04-28 NOTE — Progress Notes (Signed)
Casey Greene/thx dmf 

## 2020-06-10 ENCOUNTER — Encounter: Payer: Medicare HMO | Admitting: Family Medicine

## 2020-06-10 DIAGNOSIS — M1712 Unilateral primary osteoarthritis, left knee: Secondary | ICD-10-CM | POA: Diagnosis not present

## 2020-06-10 DIAGNOSIS — M25562 Pain in left knee: Secondary | ICD-10-CM | POA: Diagnosis not present

## 2020-06-11 DIAGNOSIS — M1712 Unilateral primary osteoarthritis, left knee: Secondary | ICD-10-CM

## 2020-06-11 HISTORY — DX: Unilateral primary osteoarthritis, left knee: M17.12

## 2020-06-17 ENCOUNTER — Other Ambulatory Visit: Payer: Self-pay

## 2020-06-17 ENCOUNTER — Ambulatory Visit (INDEPENDENT_AMBULATORY_CARE_PROVIDER_SITE_OTHER): Payer: Medicare HMO | Admitting: Family Medicine

## 2020-06-17 ENCOUNTER — Encounter: Payer: Self-pay | Admitting: Family Medicine

## 2020-06-17 ENCOUNTER — Ambulatory Visit (INDEPENDENT_AMBULATORY_CARE_PROVIDER_SITE_OTHER): Payer: Medicare HMO

## 2020-06-17 VITALS — BP 130/82 | HR 88 | Temp 98.6°F | Resp 18 | Ht 70.0 in | Wt 196.0 lb

## 2020-06-17 DIAGNOSIS — R0609 Other forms of dyspnea: Secondary | ICD-10-CM | POA: Insufficient documentation

## 2020-06-17 DIAGNOSIS — R0600 Dyspnea, unspecified: Secondary | ICD-10-CM

## 2020-06-17 DIAGNOSIS — J387 Other diseases of larynx: Secondary | ICD-10-CM | POA: Insufficient documentation

## 2020-06-17 DIAGNOSIS — I517 Cardiomegaly: Secondary | ICD-10-CM | POA: Diagnosis not present

## 2020-06-17 DIAGNOSIS — R0602 Shortness of breath: Secondary | ICD-10-CM | POA: Diagnosis not present

## 2020-06-17 DIAGNOSIS — N1832 Chronic kidney disease, stage 3b: Secondary | ICD-10-CM | POA: Diagnosis not present

## 2020-06-17 DIAGNOSIS — Z8546 Personal history of malignant neoplasm of prostate: Secondary | ICD-10-CM

## 2020-06-17 DIAGNOSIS — D638 Anemia in other chronic diseases classified elsewhere: Secondary | ICD-10-CM | POA: Diagnosis not present

## 2020-06-17 HISTORY — DX: Dyspnea, unspecified: R06.00

## 2020-06-17 HISTORY — DX: Other forms of dyspnea: R06.09

## 2020-06-17 HISTORY — DX: Other diseases of larynx: J38.7

## 2020-06-17 NOTE — Progress Notes (Signed)
Established Patient Office Visit  Subjective:  Patient ID: Casey Greene, male    DOB: 06-Jul-1951  Age: 69 y.o. MRN: [id number]  CC:  Chief Complaint  Patient presents with  . Annual Exam    HPI Casey Greene presents for routine follow-up and evaluation of his complaint of shortness of breath with exertion.  Denies chest pain nausea vomiting diaphoresis.  He has shortness of breath when he exerts himself.  He sleeps on 2 pillows.  This is not new.  Denies swelling in his lower extremities.  Denies cough fever phlegm or chills.  Denies wheezing.  Quit tobacco in 2007.  No history of COPD.  History of LVH with PEF by echocardiogram in 2020.  History of treated hep C.  History of stage IIIb CKD.  History of anemia of chronic disease.  Iron levels were low normal with labs 2 months ago.  He is not on iron replacement.  He is accompanied by his wife today.  History of prostate cancer treated with prostatectomy 3 years ago.  Past Medical History:  Diagnosis Date  . Ascending aorta dilation ([place]) 07/29/2019   (a) Echo 07/29/19 5mm  . CKD (chronic kidney disease), stage IV ([place])   . Decreased appetite   . Foley catheter in place   . Hepatitis C   . History of gout   . Hypertension   . Hypertensive heart disease   . LVH (left ventricular hypertrophy) due to hypertensive disease    (a) echo 07/29/19 severe concentric LVH  . NSVT (nonsustained ventricular tachycardia) ([place])    (a) Zio monitor 06/2019. Subsequent echo with severe concentric LVH, but normal EF 55-60%  . PAC (premature atrial contraction)    (a) ZIO 06/2019. Asymptomatic  . Prostate cancer ([place])   . Slow heartbeat   . Urinary retention     Past Surgical History:  Procedure Laterality Date  . ACHILLES TENDON REPAIR    . CERVICAL DISC SURGERY     neck  . COLONOSCOPY     before 2010   . LYMPHADENECTOMY Bilateral 01/26/2016   Procedure: EXTENDED BILATERAL PELVIC LYMPHADENECTOMY;  Surgeon: Raynelle Bring, MD;  Location: [facility];   Service: Urology;  Laterality: Bilateral;  . NOSE SURGERY     in high school-nasal fracturex2  . ROBOT ASSISTED LAPAROSCOPIC RADICAL PROSTATECTOMY N/A 01/26/2016   Procedure: XI ROBOTIC ASSISTED LAPAROSCOPIC RADICAL PROSTATECTOMY LEVEL 3;  Surgeon: Raynelle Bring, MD;  Location: [facility];  Service: Urology;  Laterality: N/A;    Family History  Problem Relation Age of Onset  . Heart disease Mother   . Obesity Mother   . Cervical cancer Mother   . Heart disease Father   . Diabetes Father   . ALS Sister   . Colon cancer Neg Hx   . Esophageal cancer Neg Hx   . Colon polyps Neg Hx   . Rectal cancer Neg Hx   . Stomach cancer Neg Hx     Social History   Socioeconomic History  . Marital status: Married    Spouse name: Not on file  . Number of children: Not on file  . Years of education: Not on file  . Highest education level: Not on file  Occupational History  . Not on file  Tobacco Use  . Smoking status: Former Smoker    Packs/day: 0.50    Years: 10.00    Pack years: 5.00    Types: Cigarettes    Quit date: 01/11/2006    Years since  quitting: 14.4  . Smokeless tobacco: Never Used  Vaping Use  . Vaping Use: Never used  Substance and Sexual Activity  . Alcohol use: No  . Drug use: Not Currently  . Sexual activity: Not Currently  Other Topics Concern  . Not on file  Social History Narrative  . Not on file   Social Determinants of Health   Financial Resource Strain:   . Difficulty of Paying Living Expenses:   Food Insecurity:   . Worried About Charity fundraiser in the Last Year:   . Arboriculturist in the Last Year:   Transportation Needs:   . Film/video editor (Medical):   Marland Kitchen Lack of Transportation (Non-Medical):   Physical Activity:   . Days of Exercise per Week:   . Minutes of Exercise per Session:   Stress:   . Feeling of Stress :   Social Connections:   . Frequency of Communication with Friends and Family:   . Frequency of Social Gatherings with Friends  and Family:   . Attends Religious Services:   . Active Member of Clubs or Organizations:   . Attends Archivist Meetings:   Marland Kitchen Marital Status:   Intimate Partner Violence:   . Fear of Current or Ex-Partner:   . Emotionally Abused:   Marland Kitchen Physically Abused:   . Sexually Abused:     Outpatient Medications Prior to Visit  Medication Sig Dispense Refill  . calcitRIOL (ROCALTROL) 0.25 MCG capsule Take 0.25 mcg by mouth daily.    Marland Kitchen losartan (COZAAR) 50 MG tablet Take 1 tablet (50 mg total) by mouth daily. 90 tablet 1  . APPLE CIDER VINEGAR PO Take 1 tablet by mouth daily.     . Calcium Carbonate-Vit D-Min (CALTRATE 600+D PLUS PO) Take 1 tablet by mouth daily.     . carvedilol (COREG) 6.25 MG tablet Take 6.25 mg by mouth 2 (two) times daily with a meal.     . cholecalciferol (VITAMIN D3) 25 MCG (1000 UT) tablet Take 1,000 Units by mouth daily.    . ferrous sulfate 325 (65 FE) MG EC tablet Take 325 mg by mouth 2 (two) times daily with a meal.    . hydrocortisone (ANUSOL-HC) 2.5 % rectal cream Place 1 application rectally 2 (two) times daily as needed for hemorrhoids or anal itching. 30 g 6  . Multiple Vitamins-Minerals (ZINC PO) Take 1 tablet by mouth daily.    . psyllium (REGULOID) 0.52 g capsule Take 0.52 g by mouth daily.    . vitamin E 400 UNIT capsule Take 400 Units by mouth daily.     No facility-administered medications prior to visit.    Allergies  Allergen Reactions  . Amlodipine Other (See Comments)    headache  . Tamsulosin Hcl     ROS Review of Systems  Constitutional: Negative.   HENT: Negative.   Eyes: Negative for photophobia and visual disturbance.  Respiratory: Positive for shortness of breath. Negative for chest tightness and wheezing.   Cardiovascular: Negative for chest pain, palpitations and leg swelling.  Gastrointestinal: Negative.  Negative for nausea and vomiting.  Endocrine: Negative for polyphagia and polyuria.  Genitourinary: Negative.     Musculoskeletal: Positive for gait problem.  Skin: Negative for pallor and rash.  Allergic/Immunologic: Negative for immunocompromised state.  Neurological: Negative for syncope.  Hematological: Does not bruise/bleed easily.  Psychiatric/Behavioral: Negative.       Objective:    Physical Exam Vitals and nursing note reviewed.  Constitutional:  General: He is not in acute distress.    Appearance: Normal appearance. He is not ill-appearing or diaphoretic.  HENT:     Head: Normocephalic and atraumatic.     Right Ear: Tympanic membrane, ear canal and external ear normal.     Left Ear: Tympanic membrane, ear canal and external ear normal.     Mouth/Throat:     Mouth: Mucous membranes are moist.     Pharynx: Oropharynx is clear. No oropharyngeal exudate or posterior oropharyngeal erythema.  Eyes:     General: No scleral icterus.       Right eye: No discharge.        Left eye: No discharge.     Extraocular Movements: Extraocular movements intact.     Conjunctiva/sclera: Conjunctivae normal.     Pupils: Pupils are equal, round, and reactive to light.  Neck:   Cardiovascular:     Rate and Rhythm: Normal rate and regular rhythm.     Heart sounds: Murmur heard.  Systolic murmur is present with a grade of 1/6.   Pulmonary:     Effort: Pulmonary effort is normal.     Breath sounds: Decreased breath sounds present. No wheezing, rhonchi or rales.  Abdominal:     General: Abdomen is flat. Bowel sounds are normal.     Palpations: Abdomen is soft.     Hernia: A hernia is present. Hernia is present in the umbilical area and ventral area.  Musculoskeletal:     Cervical back: No rigidity or tenderness.     Right lower leg: No edema.     Left lower leg: No edema.  Lymphadenopathy:     Cervical: Cervical adenopathy present.  Skin:    General: Skin is warm and dry.  Neurological:     Mental Status: He is alert and oriented to person, place, and time.  Psychiatric:        Mood and  Affect: Mood normal.        Behavior: Behavior normal.     BP (!) 130/82 (BP Location: Left Arm, Patient Position: Sitting, Cuff Size: Normal)   Pulse 88   Temp 98.6 F (37 C) (Temporal)   Resp 18   Ht 5\' 10"  (1.778 m)   Wt 196 lb (88.9 kg)   SpO2 98%   BMI 28.12 kg/m  Wt Readings from Last 3 Encounters:  06/17/20 196 lb (88.9 kg)  01/07/20 206 lb (93.4 kg)  12/03/19 206 lb 6.4 oz (93.6 kg)     Health Maintenance Due  Topic Date Due  . COVID-19 Vaccine (1) Never done  . PNA vac Low Risk Adult (1 of 2 - PCV13) Never done    There are no preventive care reminders to display for this patient.  Lab Results  Component Value Date   TSH 2.72 03/28/2018   Lab Results  Component Value Date   WBC 6.1 10/27/2019   HGB 11.7 (A) 04/13/2020   HCT 31.3 (L) 10/27/2019   MCV 91.0 10/27/2019   PLT 181.0 10/27/2019   Lab Results  Component Value Date   NA 139 04/13/2020   K 3.6 04/13/2020   CO2 26 (A) 04/13/2020   GLUCOSE 100 (H) 10/27/2019   BUN 47 (A) 04/13/2020   CREATININE 2.4 (A) 04/13/2020   BILITOT 0.4 10/27/2019   ALKPHOS 63 10/27/2019   AST 15 10/27/2019   ALT 6 10/27/2019   PROT 7.9 10/27/2019   ALBUMIN 4.1 04/13/2020   CALCIUM 9.3 04/13/2020   ANIONGAP 8  09/08/2016   GFR 30.42 (L) 10/27/2019   Lab Results  Component Value Date   CHOL 190 03/28/2018   Lab Results  Component Value Date   HDL 61.90 03/28/2018   Lab Results  Component Value Date   LDLCALC 116 (H) 03/28/2018   Lab Results  Component Value Date   TRIG 61.0 03/28/2018   Lab Results  Component Value Date   CHOLHDL 3 03/28/2018   Lab Results  Component Value Date   HGBA1C 5.8 06/11/2019      Assessment & Plan:   Problem List Items Addressed This Visit      Respiratory   Laryngeal mass - Primary   Relevant Orders   CT Soft Tissue Neck W Contrast   CBC     Genitourinary   Stage 3b chronic kidney disease   Relevant Orders   Basic metabolic panel     Other   Anemia of  chronic disease   Relevant Orders   CBC   Vitamin B12   DOE (dyspnea on exertion)   Relevant Orders   DG Chest 2 View   CBC      No orders of the defined types were placed in this encounter.   Follow-up: Return in about 3 months (around 09/17/2020), or if symptoms worsen or fail to improve.   No obvious cause for DOE.  Left-sided laryngeal mass versus adenopathy versus?Marland Kitchen  Soft tissue CT of neck requested.  Contrast recommended.  Hopefully radiology can do this without contrast.  Recommended multivitamin with iron. Libby Maw, MD

## 2020-06-18 LAB — CBC
HCT: 36.1 % — ABNORMAL LOW (ref 38.5–50.0)
Hemoglobin: 12 g/dL — ABNORMAL LOW (ref 13.2–17.1)
MCH: 29.9 pg (ref 27.0–33.0)
MCHC: 33.2 g/dL (ref 32.0–36.0)
MCV: 89.8 fL (ref 80.0–100.0)
MPV: 11.2 fL (ref 7.5–12.5)
Platelets: 169 10*3/uL (ref 140–400)
RBC: 4.02 10*6/uL — ABNORMAL LOW (ref 4.20–5.80)
RDW: 13.7 % (ref 11.0–15.0)
WBC: 6.6 10*3/uL (ref 3.8–10.8)

## 2020-06-18 LAB — VITAMIN B12: Vitamin B-12: 628 pg/mL (ref 200–1100)

## 2020-06-18 LAB — BASIC METABOLIC PANEL
BUN/Creatinine Ratio: 17 (calc) (ref 6–22)
BUN: 45 mg/dL — ABNORMAL HIGH (ref 7–25)
CO2: 27 mmol/L (ref 20–32)
Calcium: 9.5 mg/dL (ref 8.6–10.3)
Chloride: 101 mmol/L (ref 98–110)
Creat: 2.66 mg/dL — ABNORMAL HIGH (ref 0.70–1.18)
Glucose, Bld: 91 mg/dL (ref 65–99)
Potassium: 3.7 mmol/L (ref 3.5–5.3)
Sodium: 139 mmol/L (ref 135–146)

## 2020-06-20 ENCOUNTER — Ambulatory Visit (INDEPENDENT_AMBULATORY_CARE_PROVIDER_SITE_OTHER): Payer: Medicare HMO | Admitting: Internal Medicine

## 2020-06-20 ENCOUNTER — Other Ambulatory Visit: Payer: Self-pay

## 2020-06-20 ENCOUNTER — Encounter: Payer: Self-pay | Admitting: Internal Medicine

## 2020-06-20 VITALS — BP 151/84 | HR 72 | Wt 197.0 lb

## 2020-06-20 DIAGNOSIS — Z0000 Encounter for general adult medical examination without abnormal findings: Secondary | ICD-10-CM

## 2020-06-20 DIAGNOSIS — B182 Chronic viral hepatitis C: Secondary | ICD-10-CM

## 2020-06-20 NOTE — Progress Notes (Signed)
RFV: follow up for chronic hep C Patient ID: Casey Greene, male   DOB: February 08, 1951, 69 y.o.   MRN: [id number]  HPI Casey Greene is a pleasant 69yo M with history of chronic hepatitis C, finished treatment in 2019-20 where he comes to clinic every 6 months to check on his overall health, and Casey Greene surveillance. Since we last saw him he is doing well overall. No hx of jaundice.  Outpatient Encounter Medications as of 06/20/2020  Medication Sig  . APPLE CIDER VINEGAR PO Take 1 tablet by mouth daily.   . calcitRIOL (ROCALTROL) 0.25 MCG capsule Take 0.25 mcg by mouth daily.  . Calcium Carbonate-Vit D-Min (CALTRATE 600+D PLUS PO) Take 1 tablet by mouth daily.   . carvedilol (COREG) 6.25 MG tablet Take 6.25 mg by mouth 2 (two) times daily with a meal.   . cholecalciferol (VITAMIN D3) 25 MCG (1000 UT) tablet Take 1,000 Units by mouth daily.  . ferrous sulfate 325 (65 FE) MG EC tablet Take 325 mg by mouth 2 (two) times daily with a meal.  . hydrocortisone (ANUSOL-HC) 2.5 % rectal cream Place 1 application rectally 2 (two) times daily as needed for hemorrhoids or anal itching.  . losartan (COZAAR) 50 MG tablet Take 1 tablet (50 mg total) by mouth daily.  . Multiple Vitamins-Minerals (ZINC PO) Take 1 tablet by mouth daily.  . psyllium (REGULOID) 0.52 g capsule Take 0.52 g by mouth daily.  . vitamin E 400 UNIT capsule Take 400 Units by mouth daily.   No facility-administered encounter medications on file as of 06/20/2020.     Patient Active Problem List   Diagnosis Date Noted  . Laryngeal mass 06/17/2020  . DOE (dyspnea on exertion) 06/17/2020  . NSVT (nonsustained ventricular tachycardia) (Casey Greene)   . CKD (chronic kidney disease), stage IV (Casey Greene)   . Hypertension   . PAC (premature atrial contraction)   . LVH (left ventricular hypertrophy) due to hypertensive disease   . Ascending aorta dilation ([facility]) 07/29/2019  . Nonsustained ventricular tachycardia (Casey Greene) 07/23/2019  . Elevated hemoglobin A1c  06/11/2019  . Irregular heart rate 06/11/2019  . Corn or callus 06/11/2019  . Bleeding per rectum 12/09/2018  . Hypokalemia 12/09/2018  . Screen for colon cancer 12/09/2018  . Constipation 12/01/2018  . Mass of thyroid region 03/28/2018  . Anemia of chronic disease 03/28/2018  . Noncompliance by refusing intervention or support 10/22/2016  . Refusal of medication 10/22/2016  . Edema 07/09/2016  . Depression 02/27/2016  . Prostate cancer (Casey Greene) 01/26/2016  . Stage 3b chronic kidney disease 10/13/2014  . Chronic hepatitis C without hepatic coma (Casey Greene) 07/05/2014  . Hyperlipidemia 06/29/2014  . Essential hypertension 06/09/2014  . Onychomycosis 12/01/2013  . Pain, foot 12/01/2013  . PVD (peripheral vascular disease) (Casey Greene) 12/01/2013     Health Maintenance Due  Topic Date Due  . COVID-19 Vaccine (1) Never done  . PNA vac Low Risk Adult (1 of 2 - PCV13) Never done     Review of Systems Review of Systems  Constitutional: Negative for fever, chills, diaphoresis, activity change, appetite change, fatigue and unexpected weight change.  HENT: Negative for congestion, sore throat, rhinorrhea, sneezing, trouble swallowing and sinus pressure.  Eyes: Negative for photophobia and visual disturbance.  Respiratory: Negative for cough, chest tightness, shortness of breath, wheezing and stridor.  Cardiovascular: Negative for chest pain, palpitations and leg swelling.  Gastrointestinal: Negative for nausea, vomiting, abdominal pain, diarrhea, constipation, blood in stool, abdominal distention and anal bleeding.  Genitourinary: Negative  for dysuria, hematuria, flank pain and difficulty urinating.  Musculoskeletal: Negative for myalgias, back pain, joint swelling, arthralgias and gait problem.  Skin: Negative for color change, pallor, rash and wound.  Neurological: Negative for dizziness, tremors, weakness and light-headedness.  Hematological: Negative for adenopathy. Does not bruise/bleed easily.   Psychiatric/Behavioral: Negative for behavioral problems, confusion, sleep disturbance, dysphoric mood, decreased concentration and agitation.    Physical Exam   There were no vitals taken for this visit.  Physical Exam  Constitutional: He is oriented to person, place, and time. He appears well-developed and well-nourished. No distress.  HENT:  Mouth/Throat: Oropharynx is clear and moist. No oropharyngeal exudate.  Cardiovascular: Normal rate, regular rhythm and normal heart sounds. Exam reveals no gallop and no friction rub.  No murmur heard.  Abdominal: Soft. Bowel sounds are normal. He exhibits no distension. There is no tenderness.  Lymphadenopathy:  He has no cervical adenopathy.  Neurological: He is alert and oriented to person, place, and time.  Skin: Skin is warm and dry. No rash noted. No erythema.  Psychiatric: He has a normal mood and affect. His behavior is normal.    Lab Results  Component Value Date   HEPBSAB NEG 05/24/2010   Lab Results  Component Value Date   LABRPR NON REAC 09/06/2008    CBC Lab Results  Component Value Date   WBC 6.6 06/17/2020   RBC 4.02 (L) 06/17/2020   HGB 12.0 (L) 06/17/2020   HCT 36.1 (L) 06/17/2020   PLT 169 06/17/2020   MCV 89.8 06/17/2020   MCH 29.9 06/17/2020   MCHC 33.2 06/17/2020   RDW 13.7 06/17/2020   LYMPHSABS 0.8 10/27/2019   MONOABS 0.3 10/27/2019   EOSABS 0.1 10/27/2019    BMET Lab Results  Component Value Date   NA 139 06/17/2020   K 3.7 06/17/2020   CL 101 06/17/2020   CO2 27 06/17/2020   GLUCOSE 91 06/17/2020   BUN 45 (H) 06/17/2020   CREATININE 2.66 (H) 06/17/2020   CALCIUM 9.5 06/17/2020   GFRNONAA 27 04/13/2020   GFRAA 31 04/13/2020      Assessment and Plan  Chronic hepatitis c = will check cmp to ensure that he does not have transaminitis. Will check hep B and hep A ab to see if need to do any vaccines  [facility] surveillance = will plan to get limited RUQ U/S

## 2020-06-22 ENCOUNTER — Telehealth: Payer: Self-pay | Admitting: Family Medicine

## 2020-06-22 ENCOUNTER — Other Ambulatory Visit: Payer: Self-pay

## 2020-06-22 DIAGNOSIS — I10 Essential (primary) hypertension: Secondary | ICD-10-CM

## 2020-06-22 MED ORDER — LOSARTAN POTASSIUM 50 MG PO TABS: 50.0000 mg | ORAL_TABLET | Freq: Every day | ORAL | 1 refills | Status: DC

## 2020-06-22 MED ORDER — CARVEDILOL 6.25 MG PO TABS: 6.2500 mg | ORAL_TABLET | Freq: Two times a day (BID) | ORAL | 0 refills | Status: DC

## 2020-06-22 MED ORDER — CALCITRIOL 0.25 MCG PO CAPS: 0.2500 ug | ORAL_CAPSULE | Freq: Every day | ORAL | 0 refills | Status: DC

## 2020-06-22 NOTE — Telephone Encounter (Signed)
Done

## 2020-06-22 NOTE — Telephone Encounter (Signed)
[facility] [phone]) called after hours service to request refills for Calcitrol 0.74mcg, Carvedilol 6.25mg  and Losartan 50mg .

## 2020-06-24 DIAGNOSIS — L603 Nail dystrophy: Secondary | ICD-10-CM | POA: Diagnosis not present

## 2020-06-24 DIAGNOSIS — L84 Corns and callosities: Secondary | ICD-10-CM | POA: Diagnosis not present

## 2020-06-24 DIAGNOSIS — I739 Peripheral vascular disease, unspecified: Secondary | ICD-10-CM | POA: Diagnosis not present

## 2020-06-24 LAB — HEPATITIS C RNA QUANTITATIVE
HCV Quantitative Log: <1.18 Log IU/mL
HCV RNA, PCR, QN: <15 IU/mL

## 2020-06-24 LAB — HEPATITIS A ANTIBODY, TOTAL: Hepatitis A AB,Total: BORDERLINE — AB

## 2020-06-24 LAB — HEPATITIS B SURFACE ANTIBODY,QUALITATIVE: Hep B S Ab: NONREACTIVE

## 2020-06-27 ENCOUNTER — Ambulatory Visit (HOSPITAL_COMMUNITY): Payer: Medicare HMO

## 2020-06-29 NOTE — Progress Notes (Deleted)
Subjective:   Redford Behrle is a 69 y.o. male who presents for an Initial Medicare Annual Wellness Visit.  I connected with Rito today by telephone and verified that I am speaking with the correct person using two identifiers. Location patient: home Location provider: work Persons participating in the virtual visit: patient, Marine scientist.    I discussed the limitations, risks, security and privacy concerns of performing an evaluation and management service by telephone and the availability of in person appointments. I also discussed with the patient that there may be a patient responsible charge related to this service. The patient expressed understanding and verbally consented to this telephonic visit.    Interactive audio and video telecommunications were attempted between this provider and patient, however failed, due to patient having technical difficulties OR patient did not have access to video capability.  We continued and completed visit with audio only.  Some vital signs may be absent or patient reported.   Time Spent with patient on telephone encounter: *** minutes  Review of Systems           Objective:    There were no vitals filed for this visit. There is no height or weight on file to calculate BMI.  Advanced Directives 01/15/2019 07/03/2018 09/08/2016 09/07/2016 07/08/2016 01/26/2016 01/26/2016  Does Patient Have a Medical Advance Directive? Yes Yes No No No No -  Type of Advance Directive Living will [place];Living will - - - - -  Would patient like information on creating a medical advance directive? - - No - patient declined information No - patient declined information No - patient declined information No - patient declined information (No Data)    Current Medications (verified) Outpatient Encounter Medications as of 06/30/2020  Medication Sig  . APPLE CIDER VINEGAR PO Take 1 tablet by mouth daily.  (Patient not taking: Reported on 06/20/2020)  .  calcitRIOL (ROCALTROL) 0.25 MCG capsule Take 1 capsule (0.25 mcg total) by mouth daily.  . Calcium Carbonate-Vit D-Min (CALTRATE 600+D PLUS PO) Take 1 tablet by mouth daily.   . carvedilol (COREG) 6.25 MG tablet Take 1 tablet (6.25 mg total) by mouth 2 (two) times daily with a meal.  . cholecalciferol (VITAMIN D3) 25 MCG (1000 UT) tablet Take 1,000 Units by mouth daily. (Patient not taking: Reported on 06/20/2020)  . ferrous sulfate 325 (65 FE) MG EC tablet Take 325 mg by mouth 2 (two) times daily with a meal. (Patient not taking: Reported on 06/20/2020)  . hydrocortisone (ANUSOL-HC) 2.5 % rectal cream Place 1 application rectally 2 (two) times daily as needed for hemorrhoids or anal itching.  . losartan (COZAAR) 50 MG tablet Take 1 tablet (50 mg total) by mouth daily.  . Multiple Vitamins-Minerals (ZINC PO) Take 1 tablet by mouth daily. (Patient not taking: Reported on 06/20/2020)  . psyllium (REGULOID) 0.52 g capsule Take 0.52 g by mouth daily. (Patient not taking: Reported on 06/20/2020)  . vitamin E 400 UNIT capsule Take 400 Units by mouth daily. (Patient not taking: Reported on 06/20/2020)   No facility-administered encounter medications on file as of 06/30/2020.    Allergies (verified) Amlodipine and Tamsulosin hcl   History: Past Medical History:  Diagnosis Date  . Ascending aorta dilation ([place]) 07/29/2019   (a) Echo 07/29/19 44mm  . CKD (chronic kidney disease), stage IV ([place])   . Decreased appetite   . Foley catheter in place   . Hepatitis C   . History of gout   . Hypertension   .  Hypertensive heart disease   . LVH (left ventricular hypertrophy) due to hypertensive disease    (a) echo 07/29/19 severe concentric LVH  . NSVT (nonsustained ventricular tachycardia) ([place])    (a) Zio monitor 06/2019. Subsequent echo with severe concentric LVH, but normal EF 55-60%  . PAC (premature atrial contraction)    (a) ZIO 06/2019. Asymptomatic  . Prostate cancer ([place])   . Slow heartbeat   . Urinary  retention    Past Surgical History:  Procedure Laterality Date  . ACHILLES TENDON REPAIR    . CERVICAL DISC SURGERY     neck  . COLONOSCOPY     before 2010   . LYMPHADENECTOMY Bilateral 01/26/2016   Procedure: EXTENDED BILATERAL PELVIC LYMPHADENECTOMY;  Surgeon: Raynelle Bring, MD;  Location: [facility];  Service: Urology;  Laterality: Bilateral;  . NOSE SURGERY     in high school-nasal fracturex2  . ROBOT ASSISTED LAPAROSCOPIC RADICAL PROSTATECTOMY N/A 01/26/2016   Procedure: XI ROBOTIC ASSISTED LAPAROSCOPIC RADICAL PROSTATECTOMY LEVEL 3;  Surgeon: Raynelle Bring, MD;  Location: [facility];  Service: Urology;  Laterality: N/A;   Family History  Problem Relation Age of Onset  . Heart disease Mother   . Obesity Mother   . Cervical cancer Mother   . Heart disease Father   . Diabetes Father   . ALS Sister   . Colon cancer Neg Hx   . Esophageal cancer Neg Hx   . Colon polyps Neg Hx   . Rectal cancer Neg Hx   . Stomach cancer Neg Hx    Social History   Socioeconomic History  . Marital status: Married    Spouse name: Not on file  . Number of children: Not on file  . Years of education: Not on file  . Highest education level: Not on file  Occupational History  . Not on file  Tobacco Use  . Smoking status: Former Smoker    Packs/day: 0.50    Years: 10.00    Pack years: 5.00    Types: Cigarettes    Quit date: 01/11/2006    Years since quitting: 14.4  . Smokeless tobacco: Never Used  Vaping Use  . Vaping Use: Never used  Substance and Sexual Activity  . Alcohol use: No  . Drug use: Not Currently  . Sexual activity: Not Currently  Other Topics Concern  . Not on file  Social History Narrative  . Not on file   Social Determinants of Health   Financial Resource Strain:   . Difficulty of Paying Living Expenses:   Food Insecurity:   . Worried About Charity fundraiser in the Last Year:   . Arboriculturist in the Last Year:   Transportation Needs:   . Film/video editor  (Medical):   Marland Kitchen Lack of Transportation (Non-Medical):   Physical Activity:   . Days of Exercise per Week:   . Minutes of Exercise per Session:   Stress:   . Feeling of Stress :   Social Connections:   . Frequency of Communication with Friends and Family:   . Frequency of Social Gatherings with Friends and Family:   . Attends Religious Services:   . Active Member of Clubs or Organizations:   . Attends Archivist Meetings:   Marland Kitchen Marital Status:     Tobacco Counseling Counseling given: Not Answered   Clinical Intake:                 Diabetic?No  Activities of Daily Living No flowsheet data found.  Patient Care Team: Libby Maw, MD as PCP - General (Family Medicine) Park Liter, MD as PCP - Cardiology (Cardiology)  Indicate any recent Medical Services you may have received from other than [facility] providers in the past year (date may be approximate).     Assessment:   This is a routine wellness examination for [place].  Hearing/Vision screen No exam data present  Dietary issues and exercise activities discussed:    Goals   None    Depression Screen PHQ 2/9 Scores 12/03/2019 01/05/2019 10/08/2018 05/01/2018 03/28/2018  PHQ - 2 Score 0 2 0 0 0  PHQ- 9 Score - 2 - - -    Fall Risk Fall Risk  12/03/2019 01/05/2019 10/08/2018 05/01/2018 03/28/2018  Falls in the past year? 0 0 0 No No  Number falls in past yr: - 0 - - -  Injury with Fall? - 0 - - -    Any stairs in or around the home? {YES/NO:21197} If so, are there any without handrails? {YES/NO:21197} Home free of loose throw rugs in walkways, pet beds, electrical cords, etc? {YES/NO:21197} Adequate lighting in your home to reduce risk of falls? {YES/NO:21197}  ASSISTIVE DEVICES UTILIZED TO PREVENT FALLS:  Life alert? {YES/NO:21197} Use of a cane, walker or w/c? {YES/NO:21197} Grab bars in the bathroom? {YES/NO:21197} Shower chair or bench in shower? {YES/NO:21197} Elevated  toilet seat or a handicapped toilet? {YES/NO:21197}  TIMED UP AND GO:  Was the test performed? No . Phone visit   Cognitive Function:        Immunizations Immunization History  Administered Date(s) Administered  . Hepatitis A, Adult 05/01/2018, 01/05/2019  . Hepatitis B, adult 05/01/2018, 06/10/2018, 01/05/2019    {TDAP status:2101805}   {Flu Vaccine status:2101806}   {Pneumococcal vaccine status:2101807}   {Covid-19 vaccine status:2101808}  Qualifies for Shingles Vaccine? {YES/NO:21197}  Zostavax completed {YES/NO:21197}  {Shingrix Completed?:2101804}  Screening Tests Health Maintenance  Topic Date Due  . COVID-19 Vaccine (1) Never done  . PNA vac Low Risk Adult (1 of 2 - PCV13) Never done  . INFLUENZA VACCINE  06/26/2020  . TETANUS/TDAP  12/02/2020 (Originally 01/09/1970)  . COLONOSCOPY  01/27/2029  . Hepatitis C Screening  Completed    Health Maintenance  Health Maintenance Due  Topic Date Due  . COVID-19 Vaccine (1) Never done  . PNA vac Low Risk Adult (1 of 2 - PCV13) Never done  . INFLUENZA VACCINE  06/26/2020    Colorectal cancer screening: Completed 01/28/2019. Repeat every 10 years  Lung Cancer Screening: (Low Dose CT Chest recommended if Age 9-80 years, 30 pack-year currently smoking OR have quit w/in 15years.) {DOES NOT does:27190::"does not"} qualify.   Lung Cancer Screening Referral: ***  Additional Screening:  Hepatitis C Screening:  Completed 06/20/2020  Vision Screening: Recommended annual ophthalmology exams for early detection of glaucoma and other disorders of the eye. Is the patient up to date with their annual eye exam?  {YES/NO:21197} Who is the provider or what is the name of the office in which the patient attends annual eye exams? *** If pt is not established with a provider, would they like to be referred to a provider to establish care? {YES/NO:21197}.   Dental Screening: Recommended annual dental exams for proper oral  hygiene  Community Resource Referral / Chronic Care Management: CRR required this visit?  {YES/NO:21197}  CCM required this visit?  {YES/NO:21197}     Plan:     I have  personally reviewed and noted the following in the patient's chart:   . Medical and social history . Use of alcohol, tobacco or illicit drugs  . Current medications and supplements . Functional ability and status . Nutritional status . Physical activity . Advanced directives . List of other physicians . Hospitalizations, surgeries, and ER visits in previous 12 months . Vitals . Screenings to include cognitive, depression, and falls . Referrals and appointments  In addition, I have reviewed and discussed with patient certain preventive protocols, quality metrics, and best practice recommendations. A written personalized care plan for preventive services as well as general preventive health recommendations were provided to patient.  Due to this being a telephonic visit, the after visit summary with patients personalized plan was offered to patient via mail or my-chart. Patient would like to access on my-chart.     Marta Antu, LPN   03/31/3892  Nurse Health Advisor  Nurse Notes: ***

## 2020-06-30 ENCOUNTER — Telehealth: Payer: Self-pay

## 2020-06-30 ENCOUNTER — Ambulatory Visit: Payer: Medicare HMO | Admitting: Internal Medicine

## 2020-06-30 ENCOUNTER — Ambulatory Visit: Payer: Medicare HMO

## 2020-07-19 NOTE — Telephone Encounter (Signed)
See routing comment in telephone encounter

## 2020-07-20 ENCOUNTER — Ambulatory Visit (HOSPITAL_COMMUNITY): Payer: Medicare HMO

## 2020-07-25 DIAGNOSIS — M21611 Bunion of right foot: Secondary | ICD-10-CM | POA: Diagnosis not present

## 2020-07-25 DIAGNOSIS — M7751 Other enthesopathy of right foot: Secondary | ICD-10-CM | POA: Diagnosis not present

## 2020-07-25 DIAGNOSIS — G5761 Lesion of plantar nerve, right lower limb: Secondary | ICD-10-CM | POA: Diagnosis not present

## 2020-07-26 ENCOUNTER — Ambulatory Visit (HOSPITAL_COMMUNITY)
Admission: RE | Admit: 2020-07-26 | Discharge: 2020-07-26 | Disposition: A | Discharge status: Home / Self Care | Payer: Medicare HMO | Source: Ambulatory Visit | Attending: Family Medicine | Admitting: Family Medicine

## 2020-07-26 ENCOUNTER — Encounter (HOSPITAL_COMMUNITY): Payer: Self-pay

## 2020-07-26 ENCOUNTER — Other Ambulatory Visit: Payer: Self-pay

## 2020-07-26 DIAGNOSIS — J387 Other diseases of larynx: Secondary | ICD-10-CM | POA: Diagnosis not present

## 2020-07-26 DIAGNOSIS — R0609 Other forms of dyspnea: Secondary | ICD-10-CM | POA: Diagnosis not present

## 2020-07-26 DIAGNOSIS — I6529 Occlusion and stenosis of unspecified carotid artery: Secondary | ICD-10-CM | POA: Diagnosis not present

## 2020-08-05 DIAGNOSIS — N393 Stress incontinence (female) (male): Secondary | ICD-10-CM | POA: Diagnosis not present

## 2020-08-05 DIAGNOSIS — C61 Malignant neoplasm of prostate: Secondary | ICD-10-CM | POA: Diagnosis not present

## 2020-08-05 DIAGNOSIS — K432 Incisional hernia without obstruction or gangrene: Secondary | ICD-10-CM | POA: Diagnosis not present

## 2020-08-05 DIAGNOSIS — R351 Nocturia: Secondary | ICD-10-CM | POA: Diagnosis not present

## 2020-08-16 DIAGNOSIS — M25562 Pain in left knee: Secondary | ICD-10-CM | POA: Diagnosis not present

## 2020-08-16 DIAGNOSIS — M1712 Unilateral primary osteoarthritis, left knee: Secondary | ICD-10-CM | POA: Diagnosis not present

## 2020-08-16 DIAGNOSIS — M2392 Unspecified internal derangement of left knee: Secondary | ICD-10-CM | POA: Diagnosis not present

## 2020-08-30 ENCOUNTER — Ambulatory Visit (HOSPITAL_COMMUNITY): Payer: Medicare HMO

## 2020-09-02 ENCOUNTER — Other Ambulatory Visit: Payer: Self-pay | Admitting: Family Medicine

## 2020-09-02 DIAGNOSIS — S83242A Other tear of medial meniscus, current injury, left knee, initial encounter: Secondary | ICD-10-CM | POA: Diagnosis not present

## 2020-09-02 DIAGNOSIS — M67962 Unspecified disorder of synovium and tendon, left lower leg: Secondary | ICD-10-CM | POA: Diagnosis not present

## 2020-09-02 DIAGNOSIS — I10 Essential (primary) hypertension: Secondary | ICD-10-CM

## 2020-09-02 DIAGNOSIS — M94262 Chondromalacia, left knee: Secondary | ICD-10-CM | POA: Diagnosis not present

## 2020-09-05 ENCOUNTER — Telehealth: Payer: Self-pay | Admitting: Family Medicine

## 2020-09-05 NOTE — Telephone Encounter (Signed)
Left message for patient to schedule Annual Wellness Visit.  Please schedule with Nurse Health Advisor Martha Stanley, RN at [facility]  °

## 2020-09-13 DIAGNOSIS — M25562 Pain in left knee: Secondary | ICD-10-CM | POA: Diagnosis not present

## 2020-09-13 DIAGNOSIS — M1712 Unilateral primary osteoarthritis, left knee: Secondary | ICD-10-CM | POA: Diagnosis not present

## 2020-09-19 DIAGNOSIS — C61 Malignant neoplasm of prostate: Secondary | ICD-10-CM | POA: Diagnosis not present

## 2020-09-22 ENCOUNTER — Encounter: Payer: Self-pay | Admitting: Family Medicine

## 2020-09-23 ENCOUNTER — Ambulatory Visit: Payer: Medicare HMO | Admitting: Family Medicine

## 2020-09-26 ENCOUNTER — Ambulatory Visit: Payer: Medicare HMO | Admitting: Family Medicine

## 2020-09-26 DIAGNOSIS — R269 Unspecified abnormalities of gait and mobility: Secondary | ICD-10-CM | POA: Diagnosis not present

## 2020-09-26 DIAGNOSIS — M62552 Muscle wasting and atrophy, not elsewhere classified, left thigh: Secondary | ICD-10-CM | POA: Diagnosis not present

## 2020-09-26 DIAGNOSIS — M25661 Stiffness of right knee, not elsewhere classified: Secondary | ICD-10-CM | POA: Diagnosis not present

## 2020-09-26 DIAGNOSIS — M25562 Pain in left knee: Secondary | ICD-10-CM | POA: Diagnosis not present

## 2020-09-26 DIAGNOSIS — I10 Essential (primary) hypertension: Secondary | ICD-10-CM | POA: Diagnosis not present

## 2020-09-28 ENCOUNTER — Ambulatory Visit: Payer: Medicare HMO | Admitting: Family Medicine

## 2020-09-30 ENCOUNTER — Ambulatory Visit: Payer: Medicare HMO | Admitting: Family Medicine

## 2020-09-30 ENCOUNTER — Telehealth: Payer: Self-pay

## 2020-09-30 DIAGNOSIS — M25562 Pain in left knee: Secondary | ICD-10-CM | POA: Diagnosis not present

## 2020-09-30 DIAGNOSIS — M25661 Stiffness of right knee, not elsewhere classified: Secondary | ICD-10-CM | POA: Diagnosis not present

## 2020-09-30 DIAGNOSIS — N393 Stress incontinence (female) (male): Secondary | ICD-10-CM | POA: Diagnosis not present

## 2020-09-30 DIAGNOSIS — C61 Malignant neoplasm of prostate: Secondary | ICD-10-CM | POA: Diagnosis not present

## 2020-09-30 DIAGNOSIS — R350 Frequency of micturition: Secondary | ICD-10-CM | POA: Diagnosis not present

## 2020-09-30 DIAGNOSIS — R269 Unspecified abnormalities of gait and mobility: Secondary | ICD-10-CM | POA: Diagnosis not present

## 2020-09-30 DIAGNOSIS — I10 Essential (primary) hypertension: Secondary | ICD-10-CM | POA: Diagnosis not present

## 2020-09-30 DIAGNOSIS — M62552 Muscle wasting and atrophy, not elsewhere classified, left thigh: Secondary | ICD-10-CM | POA: Diagnosis not present

## 2020-09-30 NOTE — Telephone Encounter (Signed)
If pt is asymptomatic with elevated BP, would recommend increasing losartan from 50mg  to 100mg  (2 50mg  tabs) daily and continue coreg 6.25mg  BID. Check BP 1-2x/day and keep a log of readings. Schedule f/u appt next week with our office. If pt states to experience CP, SOB, HA, dizziness, vision changes he needs to go to ER.

## 2020-09-30 NOTE — Telephone Encounter (Signed)
Left message on voicemail to call office.  

## 2020-09-30 NOTE — Telephone Encounter (Signed)
Pt's wife called informing the office that pt just left the Urologist and had a elevated BP of 186/112.  Pt's wife said that pt didn't have any other symptoms.  Urology informed them to call PCP to inform. Also they was informed that Dr. Ethelene Hal was not here today and that I would let the Doc of the day know.  I told pt that if he begins to have any other symptoms to go to urgent care or ER.  Please advise.

## 2020-10-05 NOTE — Telephone Encounter (Signed)
Pt's wife called and scheduled a f/u visit  For elevated BP.  Please see message.

## 2020-10-06 ENCOUNTER — Other Ambulatory Visit (HOSPITAL_COMMUNITY): Payer: Self-pay | Admitting: Urology

## 2020-10-06 DIAGNOSIS — C61 Malignant neoplasm of prostate: Secondary | ICD-10-CM

## 2020-10-07 ENCOUNTER — Other Ambulatory Visit: Payer: Self-pay

## 2020-10-07 ENCOUNTER — Emergency Department (HOSPITAL_COMMUNITY): Payer: Medicare HMO

## 2020-10-07 ENCOUNTER — Inpatient Hospital Stay (HOSPITAL_COMMUNITY)
Admission: EM | Admit: 2020-10-07 | Discharge: 2020-10-09 | DRG: 305 | Disposition: A | Discharge status: Home Health | Payer: Medicare HMO | Source: Ambulatory Visit | Attending: Family Medicine | Admitting: Family Medicine

## 2020-10-07 ENCOUNTER — Ambulatory Visit: Payer: Medicare HMO | Admitting: Family

## 2020-10-07 ENCOUNTER — Encounter (HOSPITAL_COMMUNITY): Payer: Self-pay | Admitting: *Deleted

## 2020-10-07 ENCOUNTER — Ambulatory Visit: Payer: Medicare HMO | Admitting: Nurse Practitioner

## 2020-10-07 DIAGNOSIS — Z23 Encounter for immunization: Secondary | ICD-10-CM

## 2020-10-07 DIAGNOSIS — B182 Chronic viral hepatitis C: Secondary | ICD-10-CM | POA: Diagnosis present

## 2020-10-07 DIAGNOSIS — Z79899 Other long term (current) drug therapy: Secondary | ICD-10-CM | POA: Diagnosis not present

## 2020-10-07 DIAGNOSIS — Z8249 Family history of ischemic heart disease and other diseases of the circulatory system: Secondary | ICD-10-CM | POA: Diagnosis not present

## 2020-10-07 DIAGNOSIS — Z91138 Patient's unintentional underdosing of medication regimen for other reason: Secondary | ICD-10-CM | POA: Diagnosis not present

## 2020-10-07 DIAGNOSIS — R4189 Other symptoms and signs involving cognitive functions and awareness: Secondary | ICD-10-CM | POA: Diagnosis present

## 2020-10-07 DIAGNOSIS — Z87891 Personal history of nicotine dependence: Secondary | ICD-10-CM

## 2020-10-07 DIAGNOSIS — I160 Hypertensive urgency: Secondary | ICD-10-CM | POA: Diagnosis not present

## 2020-10-07 DIAGNOSIS — I77810 Thoracic aortic ectasia: Secondary | ICD-10-CM | POA: Diagnosis not present

## 2020-10-07 DIAGNOSIS — R531 Weakness: Secondary | ICD-10-CM | POA: Diagnosis present

## 2020-10-07 DIAGNOSIS — Z8049 Family history of malignant neoplasm of other genital organs: Secondary | ICD-10-CM | POA: Diagnosis not present

## 2020-10-07 DIAGNOSIS — Z888 Allergy status to other drugs, medicaments and biological substances status: Secondary | ICD-10-CM

## 2020-10-07 DIAGNOSIS — Z20822 Contact with and (suspected) exposure to covid-19: Secondary | ICD-10-CM | POA: Diagnosis present

## 2020-10-07 DIAGNOSIS — N183 Chronic kidney disease, stage 3 unspecified: Secondary | ICD-10-CM | POA: Diagnosis present

## 2020-10-07 DIAGNOSIS — N184 Chronic kidney disease, stage 4 (severe): Secondary | ICD-10-CM | POA: Diagnosis not present

## 2020-10-07 DIAGNOSIS — I1310 Hypertensive heart and chronic kidney disease without heart failure, with stage 1 through stage 4 chronic kidney disease, or unspecified chronic kidney disease: Secondary | ICD-10-CM | POA: Diagnosis present

## 2020-10-07 DIAGNOSIS — T447X6A Underdosing of beta-adrenoreceptor antagonists, initial encounter: Secondary | ICD-10-CM | POA: Diagnosis not present

## 2020-10-07 DIAGNOSIS — I10 Essential (primary) hypertension: Secondary | ICD-10-CM | POA: Diagnosis not present

## 2020-10-07 DIAGNOSIS — Z833 Family history of diabetes mellitus: Secondary | ICD-10-CM | POA: Diagnosis not present

## 2020-10-07 DIAGNOSIS — E876 Hypokalemia: Secondary | ICD-10-CM | POA: Diagnosis present

## 2020-10-07 DIAGNOSIS — R001 Bradycardia, unspecified: Secondary | ICD-10-CM | POA: Diagnosis present

## 2020-10-07 DIAGNOSIS — Z8546 Personal history of malignant neoplasm of prostate: Secondary | ICD-10-CM | POA: Diagnosis not present

## 2020-10-07 DIAGNOSIS — E269 Hyperaldosteronism, unspecified: Secondary | ICD-10-CM | POA: Diagnosis present

## 2020-10-07 DIAGNOSIS — T465X6A Underdosing of other antihypertensive drugs, initial encounter: Secondary | ICD-10-CM | POA: Diagnosis present

## 2020-10-07 HISTORY — DX: Cardiac arrhythmia, unspecified: I49.9

## 2020-10-07 HISTORY — DX: Hypertensive urgency: I16.0

## 2020-10-07 HISTORY — DX: Depression, unspecified: F32.A

## 2020-10-07 LAB — URINALYSIS, ROUTINE W REFLEX MICROSCOPIC
Bacteria, UA: NONE SEEN
Bilirubin Urine: NEGATIVE
Glucose, UA: 50 mg/dL — AB
Ketones, ur: NEGATIVE mg/dL
Leukocytes,Ua: NEGATIVE
Nitrite: NEGATIVE
Protein, ur: NEGATIVE mg/dL
Specific Gravity, Urine: 1.008 (ref 1.005–1.030)
pH: 7 (ref 5.0–8.0)

## 2020-10-07 LAB — BASIC METABOLIC PANEL
Anion gap: 11 (ref 5–15)
BUN: 41 mg/dL — ABNORMAL HIGH (ref 8–23)
CO2: 28 mmol/L (ref 22–32)
Calcium: 10.6 mg/dL — ABNORMAL HIGH (ref 8.9–10.3)
Chloride: 101 mmol/L (ref 98–111)
Creatinine, Ser: 2.44 mg/dL — ABNORMAL HIGH (ref 0.61–1.24)
GFR, Estimated: 28 mL/min — ABNORMAL LOW (ref >60)
Glucose, Bld: 103 mg/dL — ABNORMAL HIGH (ref 70–99)
Potassium: 3.3 mmol/L — ABNORMAL LOW (ref 3.5–5.1)
Sodium: 140 mmol/L (ref 135–145)

## 2020-10-07 LAB — CBC WITH DIFFERENTIAL/PLATELET
Abs Immature Granulocytes: 0.01 10*3/uL (ref 0.00–0.07)
Basophils Absolute: 0.1 10*3/uL (ref 0.0–0.1)
Basophils Relative: 1 %
Eosinophils Absolute: 0.1 10*3/uL (ref 0.0–0.5)
Eosinophils Relative: 1 %
HCT: 37.3 % — ABNORMAL LOW (ref 39.0–52.0)
Hemoglobin: 12.5 g/dL — ABNORMAL LOW (ref 13.0–17.0)
Immature Granulocytes: 0 %
Lymphocytes Relative: 13 %
Lymphs Abs: 0.8 10*3/uL (ref 0.7–4.0)
MCH: 31.6 pg (ref 26.0–34.0)
MCHC: 33.5 g/dL (ref 30.0–36.0)
MCV: 94.4 fL (ref 80.0–100.0)
Monocytes Absolute: 0.4 10*3/uL (ref 0.1–1.0)
Monocytes Relative: 7 %
Neutro Abs: 5.1 10*3/uL (ref 1.7–7.7)
Neutrophils Relative %: 78 %
Platelets: 170 10*3/uL (ref 150–400)
RBC: 3.95 MIL/uL — ABNORMAL LOW (ref 4.22–5.81)
RDW: 13.1 % (ref 11.5–15.5)
WBC: 6.5 10*3/uL (ref 4.0–10.5)
nRBC: 0 % (ref 0.0–0.2)

## 2020-10-07 LAB — RESPIRATORY PANEL BY RT PCR (FLU A&B, COVID)
Influenza A by PCR: NEGATIVE
Influenza B by PCR: NEGATIVE
SARS Coronavirus 2 by RT PCR: NEGATIVE

## 2020-10-07 MED ORDER — ACETAMINOPHEN 650 MG RE SUPP: 650.0000 mg | Freq: Four times a day (QID) | RECTAL | Status: DC | PRN

## 2020-10-07 MED ORDER — CARVEDILOL 3.125 MG PO TABS
6.2500 mg | ORAL_TABLET | Freq: Once | ORAL | Status: AC
  Administered 2020-10-07: 6.25 mg via ORAL
  Filled 2020-10-07: qty 2

## 2020-10-07 MED ORDER — IRBESARTAN 150 MG PO TABS: 150.0000 mg | ORAL_TABLET | Freq: Every day | ORAL | Status: DC

## 2020-10-07 MED ORDER — LOSARTAN POTASSIUM 25 MG PO TABS
50.0000 mg | ORAL_TABLET | Freq: Once | ORAL | Status: AC
  Administered 2020-10-07: 50 mg via ORAL
  Filled 2020-10-07: qty 2

## 2020-10-07 MED ORDER — CARVEDILOL 6.25 MG PO TABS
6.2500 mg | ORAL_TABLET | Freq: Two times a day (BID) | ORAL | Status: DC
  Administered 2020-10-08 – 2020-10-09 (×3): 6.25 mg via ORAL
  Filled 2020-10-07 (×3): qty 1

## 2020-10-07 MED ORDER — ACETAMINOPHEN 325 MG PO TABS
650.0000 mg | ORAL_TABLET | Freq: Four times a day (QID) | ORAL | Status: DC | PRN
  Administered 2020-10-08: 650 mg via ORAL
  Filled 2020-10-07: qty 2

## 2020-10-07 MED ORDER — PNEUMOCOCCAL VAC POLYVALENT 25 MCG/0.5ML IJ INJ
0.5000 mL | INJECTION | INTRAMUSCULAR | Status: AC
  Administered 2020-10-08: 0.5 mL via INTRAMUSCULAR
  Filled 2020-10-07: qty 0.5

## 2020-10-07 MED ORDER — OXYBUTYNIN CHLORIDE 5 MG PO TABS
5.0000 mg | ORAL_TABLET | Freq: Every day | ORAL | Status: DC
  Administered 2020-10-07 – 2020-10-08 (×2): 5 mg via ORAL
  Filled 2020-10-07 (×2): qty 1

## 2020-10-07 MED ORDER — HYDRALAZINE HCL 20 MG/ML IJ SOLN
10.0000 mg | Freq: Once | INTRAMUSCULAR | Status: AC
  Administered 2020-10-07: 10 mg via INTRAVENOUS
  Filled 2020-10-07: qty 1

## 2020-10-07 NOTE — ED Triage Notes (Addendum)
States his bp has been high a couple of days, sent from [facility]. Pt has history, denies any symptoms with HTN.

## 2020-10-07 NOTE — ED Provider Notes (Addendum)
[facility] DEPT Provider Note   CSN: [id number] Arrival date & time: 10/07/20  1429     History Chief Complaint  Patient presents with  . Hypertension    Casey Greene is a 69 y.o. male.  Patient sent in from urgent care for markedly elevated blood pressures.  Patient with a known history of chronic kidney disease.  Stage III.  Patient primary care provider Dr. Ethelene Hal had patient increase his blood pressure regimen on November 5.  Patient taking Coreg 6.25 mg twice a day.  His losartan was increased from 50 mg to 100 mg daily.  Patient decided to take that 50 mg twice a day.  Some question about compliance.  Patient denies headache chest pain shortness of breath or any strokelike symptoms.  Blood pressure here was 208/141.  Besides the past medical history of hypertension is hepatitis C.  Urinary retention being evaluated for possible prostate cancer.  Has an ascending aortic dilatation last evaluated by echo in September 2020.  And known to have hypertensive heart disease.  Patient has had Covid vaccines.  The primary care increase the medications good blood pressures were high he was post to monitor his blood pressures at home.  Was post to follow-up with them today.  They had a cancel his appointment that is why he went to urgent care urgent care referred him here.  Urgent care was worried about a hypertensive urgency.        Past Medical History:  Diagnosis Date  . Ascending aorta dilation ([place]) 07/29/2019   (a) Echo 07/29/19 87mm  . CKD (chronic kidney disease), stage IV ([place])   . Decreased appetite   . Foley catheter in place   . Hepatitis C   . History of gout   . Hypertension   . Hypertensive heart disease   . LVH (left ventricular hypertrophy) due to hypertensive disease    (a) echo 07/29/19 severe concentric LVH  . NSVT (nonsustained ventricular tachycardia) ([place])    (a) Zio monitor 06/2019. Subsequent echo with severe concentric LVH, but  normal EF 55-60%  . PAC (premature atrial contraction)    (a) ZIO 06/2019. Asymptomatic  . Prostate cancer ([place])   . Slow heartbeat   . Urinary retention     Patient Active Problem List   Diagnosis Date Noted  . Laryngeal mass 06/17/2020  . DOE (dyspnea on exertion) 06/17/2020  . NSVT (nonsustained ventricular tachycardia) ([place])   . CKD (chronic kidney disease), stage IV ([place])   . Hypertension   . PAC (premature atrial contraction)   . LVH (left ventricular hypertrophy) due to hypertensive disease   . Ascending aorta dilation ([facility]) 07/29/2019  . Nonsustained ventricular tachycardia ([place]) 07/23/2019  . Elevated hemoglobin A1c 06/11/2019  . Irregular heart rate 06/11/2019  . Corn or callus 06/11/2019  . Bleeding per rectum 12/09/2018  . Hypokalemia 12/09/2018  . Screen for colon cancer 12/09/2018  . Constipation 12/01/2018  . Mass of thyroid region 03/28/2018  . Anemia of chronic disease 03/28/2018  . Noncompliance by refusing intervention or support 10/22/2016  . Refusal of medication 10/22/2016  . Edema 07/09/2016  . Depression 02/27/2016  . Prostate cancer ([place]) 01/26/2016  . Stage 3b chronic kidney disease ([place]) 10/13/2014  . Chronic hepatitis C without hepatic coma ([place]) 07/05/2014  . Hyperlipidemia 06/29/2014  . Essential hypertension 06/09/2014  . Onychomycosis 12/01/2013  . Pain, foot 12/01/2013  . PVD (peripheral vascular disease) ([place]) 12/01/2013    Past Surgical History:  Procedure Laterality Date  . ACHILLES TENDON REPAIR    . CERVICAL DISC SURGERY     neck  . COLONOSCOPY     before 2010   . LYMPHADENECTOMY Bilateral 01/26/2016   Procedure: EXTENDED BILATERAL PELVIC LYMPHADENECTOMY;  Surgeon: Raynelle Bring, MD;  Location[facility];  Service: Urology;  Laterality: Bilateral;  . NOSE SURGERY     in high school-nasal fracturex2  . ROBOT ASSISTED LAPAROSCOPIC RADICAL PROSTATECTOMY N/A 01/26/2016   Procedure: XI ROBOTIC ASSISTED LAPAROSCOPIC RADICAL PROSTATECTOMY  LEVEL 3;  Surgeon: Raynelle Bring, MD;  Location[facility];  Service: Urology;  Laterality: N/A;       Family History  Problem Relation Age of Onset  . Heart disease Mother   . Obesity Mother   . Cervical cancer Mother   . Heart disease Father   . Diabetes Father   . ALS Sister   . Colon cancer Neg Hx   . Esophageal cancer Neg Hx   . Colon polyps Neg Hx   . Rectal cancer Neg Hx   . Stomach cancer Neg Hx     Social History   Tobacco Use  . Smoking status: Former Smoker    Packs/day: 0.50    Years: 10.00    Pack years: 5.00    Types: Cigarettes    Quit date: 01/11/2006    Years since quitting: 14.7  . Smokeless tobacco: Never Used  Vaping Use  . Vaping Use: Never used  Substance Use Topics  . Alcohol use: No  . Drug use: Not Currently    Home Medications Prior to Admission medications   Medication Sig Start Date End Date Taking? Authorizing Provider  Calcium Carbonate-Vit D-Min (CALTRATE 600+D PLUS PO) Take 1 tablet by mouth daily.    Yes [provider]  carvedilol (COREG) 6.25 MG tablet Take 1 tablet (6.25 mg total) by mouth 2 (two) times daily with a meal. 06/22/20  Yes Libby Maw, MD  losartan (COZAAR) 50 MG tablet TAKE 1 TABLET(50 MG) BY MOUTH DAILY Patient taking differently: Take 50 mg by mouth daily. Pt has been taking 2 tablets (100 mg) for last 3 Days 09/02/20  Yes Libby Maw, MD  naproxen sodium (ALEVE) 220 MG tablet Take 220 mg by mouth daily as needed (pain).   Yes [provider]  oxybutynin (DITROPAN) 5 MG tablet Take 5 mg by mouth at bedtime. 08/05/20  Yes [provider]  calcitRIOL (ROCALTROL) 0.25 MCG capsule Take 1 capsule (0.25 mcg total) by mouth daily. Patient not taking: Reported on 10/07/2020 06/22/20   Libby Maw, MD  hydrocortisone (ANUSOL-HC) 2.5 % rectal cream Place 1 application rectally 2 (two) times daily as needed for hemorrhoids or anal itching. Patient not taking: Reported on  10/07/2020 10/26/19   Jackquline Denmark, MD    Allergies    Amlodipine and Tamsulosin hcl  Review of Systems   Review of Systems  Constitutional: Negative for chills and fever.  HENT: Negative for congestion, rhinorrhea and sore throat.   Eyes: Negative for visual disturbance.  Respiratory: Negative for cough and shortness of breath.   Cardiovascular: Negative for chest pain and leg swelling.  Gastrointestinal: Negative for abdominal pain, diarrhea, nausea and vomiting.  Genitourinary: Negative for dysuria.  Musculoskeletal: Negative for back pain and neck pain.  Skin: Negative for rash.  Neurological: Negative for dizziness, light-headedness and headaches.  Hematological: Does not bruise/bleed easily.  Psychiatric/Behavioral: Negative for confusion.    Physical Exam Updated Vital Signs BP Marland Kitchen)  197/112   Pulse 67   Temp 98.2 F (36.8 C) (Oral)   Resp 15   Ht 1.778 m (5\' 10" )   Wt 94.3 kg   SpO2 100%   BMI 29.84 kg/m   Physical Exam Vitals and nursing note reviewed.  Constitutional:      General: He is not in acute distress.    Appearance: Normal appearance. He is well-developed.  HENT:     Head: Normocephalic and atraumatic.  Eyes:     Extraocular Movements: Extraocular movements intact.     Conjunctiva/sclera: Conjunctivae normal.     Pupils: Pupils are equal, round, and reactive to light.  Cardiovascular:     Rate and Rhythm: Normal rate and regular rhythm.     Heart sounds: No murmur heard.   Pulmonary:     Effort: Pulmonary effort is normal. No respiratory distress.     Breath sounds: Normal breath sounds.  Abdominal:     Palpations: Abdomen is soft.     Tenderness: There is no abdominal tenderness.  Musculoskeletal:        General: No swelling.     Cervical back: Normal range of motion and neck supple.  Skin:    General: Skin is warm and dry.  Neurological:     General: No focal deficit present.     Mental Status: He is alert. Mental status is at  baseline.     Cranial Nerves: No cranial nerve deficit.     Sensory: No sensory deficit.     Motor: No weakness.     ED Results / Procedures / Treatments   Labs (all labs ordered are listed, but only abnormal results are displayed) Labs Reviewed  BASIC METABOLIC PANEL - Abnormal; Notable for the following components:      Result Value   Potassium 3.3 (*)    Glucose, Bld 103 (*)    BUN 41 (*)    Creatinine, Ser 2.44 (*)    Calcium 10.6 (*)    GFR, Estimated 28 (*)    All other components within normal limits  CBC WITH DIFFERENTIAL/PLATELET - Abnormal; Notable for the following components:   RBC 3.95 (*)    Hemoglobin 12.5 (*)    HCT 37.3 (*)    All other components within normal limits  RESPIRATORY PANEL BY RT PCR (FLU A&B, COVID)  URINALYSIS, ROUTINE W REFLEX MICROSCOPIC    EKG EKG Interpretation  Date/Time:  Friday October 07 2020 19:38:22 EST Ventricular Rate:  68 PR Interval:    QRS Duration: 98 QT Interval:  408 QTC Calculation: 434 R Axis:   41 Text Interpretation: Sinus rhythm Borderline repolarization abnormality Confirmed by Fredia Sorrow [phone]) on 10/07/2020 7:53:33 PM   Radiology DG Chest 2 View  Result Date: 10/07/2020 CLINICAL DATA:  Hypertension EXAM: CHEST - 2 VIEW COMPARISON:  06/17/2020 FINDINGS: The heart size and mediastinal contours are within normal limits. Both lungs are clear. The visualized skeletal structures are unremarkable. IMPRESSION: No active cardiopulmonary disease. Electronically Signed   By: Donavan Foil M.D.   On: 10/07/2020 17:58    Procedures Procedures (including critical care time)  Medications Ordered in ED Medications  carvedilol (COREG) tablet 6.25 mg (6.25 mg Oral Given 10/07/20 1954)  losartan (COZAAR) tablet 50 mg (50 mg Oral Given 10/07/20 1956)    ED Course  I have reviewed the triage vital signs and the nursing notes.  Pertinent labs & imaging results that were available during my care of the patient  were reviewed  by me and considered in my medical decision making (see chart for details).    MDM Rules/Calculators/A&P                           Patient's blood pressure remains high despite his evening doses of his blood pressure medicine.  Patient received 50 mg of losartan.  And another 6.25 mg of Coreg.  That was given at 730.  Patient's blood pressures remain high.  196/103.  Will discuss with hospitalist for consideration for admission for hypertensive urgency.  No evidence of any significant endorgan disease.  Patient known to have chronic kidney.  BUN and creatinine is somewhat baseline.  X-ray without any acute findings.  Patient without any chest pain.  EKG had some borderline repolarization changes.  Gust with hospitalist.  They will admit for observation.  And try to start a new class of hypertensive meds.    Final Clinical Impression(s) / ED Diagnoses Final diagnoses:  Hypertensive urgency    Rx / DC Orders ED Discharge Orders    None       Fredia Sorrow, MD 10/07/20 2100    Fredia Sorrow, MD 10/07/20 2111

## 2020-10-07 NOTE — H&P (Signed)
History and Physical    Casey Greene AXK:[id number] DOB: [birth year] DOA: 10/07/2020  PCP: Libby Maw, MD   Patient coming from: An urgent care  Chief Complaint: Elevated blood pressure readings.   HPI: Casey Greene is a 69 y.o. male with medical history significant for hypertension and chronic kidney disease stage 4, chronic Hepatitis C, aortic dilatation on u/s in 2020.  Patient primary care provider Dr. Ethelene Hal had patient increase his blood pressure regimen on November 5 by increasing losartan from 50 mg to 100 mg. He was continued on Coreg 6.25 mg twice a day. Patient reports he has been taking losartan 50 mg twice a day. He admits that he has not been taking his coreg regularly and has missed a few doses of losartan in the past week.  Patient denies headache chest pain shortness of breath or any strokelike symptoms.  Blood pressure has been over 200/100 on multiple checks over the past few days. BP was 208/140 when he arrived at ER today. Patient has had Covid vaccines.  He had an appointment today with PCP for follow up but the appointment was cancelled by the PCP office. He then went to urgent care with concerns about his BP and was sent to ER by the urgent care.  In the ER he was given his evening dose of coreg and losartan but BP has remained over 200/100.    Review of Systems:  General: Denies weakness, fever, chills, weight loss, night sweats.  Denies dizziness.  Denies change in appetite HENT: Denies head trauma, headache, denies change in hearing, tinnitus.  Denies nasal congestion or bleeding.  Denies sore throat, sores in mouth.  Denies difficulty swallowing Eyes: Denies blurry vision, pain in eye, drainage.  Denies discoloration of eyes. Neck: Denies pain.  Denies swelling.  Denies pain with movement. Cardiovascular: Denies chest pain, palpitations.  Denies edema.  Denies orthopnea Respiratory: Denies shortness of breath, cough.  Denies wheezing.  Denies sputum  production Gastrointestinal: Denies abdominal pain, swelling.  Denies nausea, vomiting, diarrhea.  Denies melena.  Denies hematemesis. Musculoskeletal: Denies limitation of movement.  Denies deformity or swelling.  Denies pain.  Denies arthralgias or myalgias. Genitourinary: Denies pelvic pain.  Denies urinary frequency or hesitancy.  Denies dysuria.  Skin: Denies rash.  Denies petechiae, purpura, ecchymosis. Neurological: Denies headache.  Denies syncope.  Denies seizure activity.  Denies weakness or paresthesia.  Denies slurred speech, drooping face.  Denies visual change. Psychiatric: Denies depression, anxiety.  Denies suicidal thoughts or ideation.  Denies hallucinations.  Past Medical History:  Diagnosis Date  . Ascending aorta dilation ([place]) 07/29/2019   (a) Echo 07/29/19 66mm  . CKD (chronic kidney disease), stage IV ([place])   . Decreased appetite   . Depression   . Dysrhythmia   . Foley catheter in place   . Hepatitis C   . History of gout   . Hypertension   . Hypertensive heart disease   . LVH (left ventricular hypertrophy) due to hypertensive disease    (a) echo 07/29/19 severe concentric LVH  . NSVT (nonsustained ventricular tachycardia) ([place])    (a) Zio monitor 06/2019. Subsequent echo with severe concentric LVH, but normal EF 55-60%  . PAC (premature atrial contraction)    (a) ZIO 06/2019. Asymptomatic  . Prostate cancer ([place])   . Slow heartbeat   . Urinary retention     Past Surgical History:  Procedure Laterality Date  . ACHILLES TENDON REPAIR    . CERVICAL DISC SURGERY  neck  . COLONOSCOPY     before 2010   . LYMPHADENECTOMY Bilateral 01/26/2016   Procedure: EXTENDED BILATERAL PELVIC LYMPHADENECTOMY;  Surgeon: Raynelle Bring, MD;  Location: [facility];  Service: Urology;  Laterality: Bilateral;  . NOSE SURGERY     in high school-nasal fracturex2  . ROBOT ASSISTED LAPAROSCOPIC RADICAL PROSTATECTOMY N/A 01/26/2016   Procedure: XI ROBOTIC ASSISTED LAPAROSCOPIC RADICAL  PROSTATECTOMY LEVEL 3;  Surgeon: Raynelle Bring, MD;  Location: [facility];  Service: Urology;  Laterality: N/A;    Social History  reports that he quit smoking about 14 years ago. His smoking use included cigarettes. He has a 5.00 pack-year smoking history. He has never used smokeless tobacco. He reports previous drug use. He reports that he does not drink alcohol.  Allergies  Allergen Reactions  . Amlodipine Other (See Comments)    headache  . Tamsulosin Hcl     Family History  Problem Relation Age of Onset  . Heart disease Mother   . Obesity Mother   . Cervical cancer Mother   . Heart disease Father   . Diabetes Father   . ALS Sister   . Colon cancer Neg Hx   . Esophageal cancer Neg Hx   . Colon polyps Neg Hx   . Rectal cancer Neg Hx   . Stomach cancer Neg Hx      Prior to Admission medications   Medication Sig Start Date End Date Taking? Authorizing Provider  Calcium Carbonate-Vit D-Min (CALTRATE 600+D PLUS PO) Take 1 tablet by mouth daily.    Yes [provider]  carvedilol (COREG) 6.25 MG tablet Take 1 tablet (6.25 mg total) by mouth 2 (two) times daily with a meal. 06/22/20  Yes Libby Maw, MD  losartan (COZAAR) 50 MG tablet TAKE 1 TABLET(50 MG) BY MOUTH DAILY Patient taking differently: Take 50 mg by mouth daily. Pt has been taking 2 tablets (100 mg) for last 3 Days 09/02/20  Yes Libby Maw, MD  naproxen sodium (ALEVE) 220 MG tablet Take 220 mg by mouth daily as needed (pain).   Yes [provider]  oxybutynin (DITROPAN) 5 MG tablet Take 5 mg by mouth at bedtime. 08/05/20  Yes [provider]  calcitRIOL (ROCALTROL) 0.25 MCG capsule Take 1 capsule (0.25 mcg total) by mouth daily. Patient not taking: Reported on 10/07/2020 06/22/20   Libby Maw, MD  hydrocortisone (ANUSOL-HC) 2.5 % rectal cream Place 1 application rectally 2 (two) times daily as needed for hemorrhoids or anal itching. Patient not taking: Reported on  10/07/2020 10/26/19   Jackquline Denmark, MD    Physical Exam: Vitals:   10/07/20 1750 10/07/20 1938 10/07/20 1954 10/07/20 2131  BP: (!) 208/141 (!) 205/119 (!) 197/112 (!) 189/114  Pulse: 71 70 67 62  Resp: 16 15  13   Temp:  98.2 F (36.8 C)    TempSrc:  Oral    SpO2: 100% 100%  99%  Weight:      Height:        Constitutional: NAD, calm, comfortable Vitals:   10/07/20 1750 10/07/20 1938 10/07/20 1954 10/07/20 2131  BP: (!) 208/141 (!) 205/119 (!) 197/112 (!) 189/114  Pulse: 71 70 67 62  Resp: 16 15  13   Temp:  98.2 F (36.8 C)    TempSrc:  Oral    SpO2: 100% 100%  99%  Weight:      Height:       General: WDWN, Alert and oriented x3.  Eyes: EOMI, PERRL,  lids and conjunctivae normal.  Sclera nonicteric HENT:  [place]/AT, external ears normal.  Nares patent without epistasis.  Mucous membranes are moist. Posterior pharynx without exudate or lesions.  Neck: Soft, normal range of motion, supple, no masses, no thyromegaly.  Trachea midline Respiratory: clear to auscultation bilaterally, no wheezing, no crackles. Normal respiratory effort. No accessory muscle use.  Cardiovascular: Regular rate and rhythm, no murmurs / rubs / gallops. No extremity edema. 2+ pedal pulses.  Abdomen: Soft, no tenderness, nondistended, no rebound or guarding.  No masses palpated. No hepatosplenomegaly. Bowel sounds normoactive Musculoskeletal: FROM. no clubbing / cyanosis. No joint deformity upper and lower extremities. no contractures. Normal muscle tone.  Skin: Warm, dry, intact no rashes, lesions, ulcers. No induration Neurologic: CN 2-12 grossly intact.  Normal speech.  Sensation intact, Strength 5/5 in all extremities.   Psychiatric: Normal judgment and insight.  Normal mood.    Labs on Admission: I have personally reviewed following labs and imaging studies  CBC: Recent Labs  Lab 10/07/20 1757  WBC 6.5  NEUTROABS 5.1  HGB 12.5*  HCT 37.3*  MCV 94.4  PLT 542    Basic Metabolic Panel: Recent  Labs  Lab 10/07/20 1757  NA 140  K 3.3*  CL 101  CO2 28  GLUCOSE 103*  BUN 41*  CREATININE 2.44*  CALCIUM 10.6*    GFR: Estimated Creatinine Clearance: 32 mL/min (A) (by C-G formula based on SCr of 2.44 mg/dL (H)).  Liver Function Tests: No results for input(s): AST, ALT, ALKPHOS, BILITOT, PROT, ALBUMIN in the last 168 hours.  Urine analysis:    Component Value Date/Time   COLORURINE YELLOW 03/28/2018 [place] 03/28/2018 1206   LABSPEC 1.015 03/28/2018 1206   PHURINE 6.0 03/28/2018 1206   GLUCOSEU NEGATIVE 03/28/2018 1206   HGBUR TRACE-INTACT (A) 03/28/2018 1206   BILIRUBINUR NEGATIVE 03/28/2018 1206   KETONESUR NEGATIVE 03/28/2018 1206   PROTEINUR NEGATIVE 07/08/2016 1003   UROBILINOGEN 0.2 03/28/2018 1206   NITRITE NEGATIVE 03/28/2018 1206   LEUKOCYTESUR NEGATIVE 03/28/2018 1206    Radiological Exams on Admission: DG Chest 2 View  Result Date: 10/07/2020 CLINICAL DATA:  Hypertension EXAM: CHEST - 2 VIEW COMPARISON:  06/17/2020 FINDINGS: The heart size and mediastinal contours are within normal limits. Both lungs are clear. The visualized skeletal structures are unremarkable. IMPRESSION: No active cardiopulmonary disease. Electronically Signed   By: Donavan Foil M.D.   On: 10/07/2020 17:58    EKG: Independently reviewed.  EKG is reviewed and shows normal sinus rhythm.  No acute ST elevation or depression.  QTc is 434  Assessment/Plan Principal Problem:   Hypertensive urgency Mr. Tisdel is placed on telemetry floor for observation overnight.  Patient has persistent elevated blood pressure readings of greater than 200/100.  He was given his nighttime doses of losartan and Coreg in the emergency room.  Patient reports he is doses of his Coreg 3 to 4 days out of the week.  He reports he has been taking his losartan but he has missed a few doses intermittently.  States that with his losartan he has not had good blood pressure control.  Will change  losartan to Avapro 150 mg starting in the morning.  Continue Coreg. Patient is given dose of hydralazine IV now. With patient having persistent elevated blood pressure readings will check a.m. cortisol level to assess for adrenal insufficiency which may be contributing to his hypertension.  States have estimated that 8 to 10% of patients with hard to control  blood pressure have adrenal insufficiency.  Monitor blood pressure  Active Problems:   CKD (chronic kidney disease), stage IV (HCC) Stable.  Monitor electrolytes and renal function with labs in morning.    DVT prophylaxis: Pad[place] score low.  TED hose and ambulation for DVT prophylaxis  Code Status:   Full code Family Communication:  Diagnosis and plan discussed with patient and his wife who is at the bedside.  They both verbalized understanding and agree with plan.  Questions were answered.  Further recommendations to follow as clinical indicated Disposition Plan:   Patient is from:  Home  Anticipated DC to:  Home  Anticipated DC date:  Anticipate less than two midnight stay for medical treatment  Anticipated DC barriers: No barriers to discharge identified at this time  Admission status:  Observation   Eben Burow MD [facility]  How to contact the [facility] Attending or Consulting provider [place] or covering provider during after hours [place], for this patient?   1. Check the care team in [facility] and look for a) attending/consulting [facility] provider listed and b) the [facility] team listed 2. Log into [email or web address] and use [place]'s universal password to access. If you do not have the password, please contact the hospital operator. 3. Locate the [facility] provider you are looking for under [facility] and page to a number that you can be directly reached. 4. If you still have difficulty reaching the provider, please page the [facility] (Director on Call) for the Hospitalists listed on amion for assistance.  10/07/2020, 9:45 PM

## 2020-10-07 NOTE — Telephone Encounter (Signed)
Please see message.  Thank you. Pt's wife called and canceled the appointment.  She will take him to UC this morning.

## 2020-10-08 DIAGNOSIS — Z833 Family history of diabetes mellitus: Secondary | ICD-10-CM | POA: Diagnosis not present

## 2020-10-08 DIAGNOSIS — T447X6A Underdosing of beta-adrenoreceptor antagonists, initial encounter: Secondary | ICD-10-CM | POA: Diagnosis present

## 2020-10-08 DIAGNOSIS — N184 Chronic kidney disease, stage 4 (severe): Secondary | ICD-10-CM | POA: Diagnosis present

## 2020-10-08 DIAGNOSIS — B182 Chronic viral hepatitis C: Secondary | ICD-10-CM | POA: Diagnosis present

## 2020-10-08 DIAGNOSIS — R531 Weakness: Secondary | ICD-10-CM | POA: Diagnosis present

## 2020-10-08 DIAGNOSIS — Z87891 Personal history of nicotine dependence: Secondary | ICD-10-CM | POA: Diagnosis not present

## 2020-10-08 DIAGNOSIS — T465X6A Underdosing of other antihypertensive drugs, initial encounter: Secondary | ICD-10-CM | POA: Diagnosis present

## 2020-10-08 DIAGNOSIS — R4189 Other symptoms and signs involving cognitive functions and awareness: Secondary | ICD-10-CM | POA: Diagnosis present

## 2020-10-08 DIAGNOSIS — Z79899 Other long term (current) drug therapy: Secondary | ICD-10-CM | POA: Diagnosis not present

## 2020-10-08 DIAGNOSIS — Z23 Encounter for immunization: Secondary | ICD-10-CM | POA: Diagnosis present

## 2020-10-08 DIAGNOSIS — E269 Hyperaldosteronism, unspecified: Secondary | ICD-10-CM | POA: Diagnosis present

## 2020-10-08 DIAGNOSIS — Z8249 Family history of ischemic heart disease and other diseases of the circulatory system: Secondary | ICD-10-CM | POA: Diagnosis not present

## 2020-10-08 DIAGNOSIS — I1310 Hypertensive heart and chronic kidney disease without heart failure, with stage 1 through stage 4 chronic kidney disease, or unspecified chronic kidney disease: Secondary | ICD-10-CM | POA: Diagnosis present

## 2020-10-08 DIAGNOSIS — Z20822 Contact with and (suspected) exposure to covid-19: Secondary | ICD-10-CM | POA: Diagnosis present

## 2020-10-08 DIAGNOSIS — I160 Hypertensive urgency: Secondary | ICD-10-CM | POA: Diagnosis present

## 2020-10-08 DIAGNOSIS — E876 Hypokalemia: Secondary | ICD-10-CM | POA: Diagnosis present

## 2020-10-08 DIAGNOSIS — I77810 Thoracic aortic ectasia: Secondary | ICD-10-CM | POA: Diagnosis present

## 2020-10-08 DIAGNOSIS — Z8546 Personal history of malignant neoplasm of prostate: Secondary | ICD-10-CM | POA: Diagnosis not present

## 2020-10-08 DIAGNOSIS — Z8049 Family history of malignant neoplasm of other genital organs: Secondary | ICD-10-CM | POA: Diagnosis not present

## 2020-10-08 DIAGNOSIS — Z91138 Patient's unintentional underdosing of medication regimen for other reason: Secondary | ICD-10-CM | POA: Diagnosis not present

## 2020-10-08 DIAGNOSIS — Z888 Allergy status to other drugs, medicaments and biological substances status: Secondary | ICD-10-CM | POA: Diagnosis not present

## 2020-10-08 DIAGNOSIS — R001 Bradycardia, unspecified: Secondary | ICD-10-CM | POA: Diagnosis present

## 2020-10-08 LAB — BASIC METABOLIC PANEL
Anion gap: 9 (ref 5–15)
BUN: 33 mg/dL — ABNORMAL HIGH (ref 8–23)
CO2: 28 mmol/L (ref 22–32)
Calcium: 10.3 mg/dL (ref 8.9–10.3)
Chloride: 104 mmol/L (ref 98–111)
Creatinine, Ser: 1.99 mg/dL — ABNORMAL HIGH (ref 0.61–1.24)
GFR, Estimated: 35 mL/min — ABNORMAL LOW (ref >60)
Glucose, Bld: 101 mg/dL — ABNORMAL HIGH (ref 70–99)
Potassium: 2.8 mmol/L — ABNORMAL LOW (ref 3.5–5.1)
Sodium: 141 mmol/L (ref 135–145)

## 2020-10-08 LAB — CORTISOL-AM, BLOOD: Cortisol - AM: 14.8 ug/dL (ref 6.7–22.6)

## 2020-10-08 MED ORDER — POTASSIUM CHLORIDE CRYS ER 20 MEQ PO TBCR
40.0000 meq | EXTENDED_RELEASE_TABLET | Freq: Two times a day (BID) | ORAL | Status: AC
  Administered 2020-10-08: 40 meq via ORAL
  Filled 2020-10-08: qty 2

## 2020-10-08 MED ORDER — IRBESARTAN 150 MG PO TABS
150.0000 mg | ORAL_TABLET | Freq: Every day | ORAL | Status: DC
  Administered 2020-10-08 – 2020-10-09 (×2): 150 mg via ORAL
  Filled 2020-10-08 (×2): qty 1

## 2020-10-08 MED ORDER — POTASSIUM CHLORIDE CRYS ER 20 MEQ PO TBCR
40.0000 meq | EXTENDED_RELEASE_TABLET | Freq: Two times a day (BID) | ORAL | Status: DC
  Administered 2020-10-08: 40 meq via ORAL
  Filled 2020-10-08: qty 2

## 2020-10-08 MED ORDER — HYDRALAZINE HCL 25 MG PO TABS
25.0000 mg | ORAL_TABLET | Freq: Three times a day (TID) | ORAL | Status: DC
  Administered 2020-10-08 – 2020-10-09 (×4): 25 mg via ORAL
  Filled 2020-10-08 (×4): qty 1

## 2020-10-08 NOTE — Progress Notes (Signed)
PROGRESS NOTE  Casey Greene  HYQ:[id number] DOB: [birth year] DOA: 10/07/2020 PCP: Libby Maw, MD   Brief Narrative: Casey Greene is a 69 y.o. male with medical history significant for hypertension and chronic kidney disease stage 4, chronic Hepatitis C, aortic dilatation on u/s in 2020. Patient primary care provider Dr. Ethelene Hal had patient increase his blood pressure regimen on November 5 by increasing losartan from 50 mg to 100 mg. He was continued on Coreg 6.25 mg twice a day. Patient reports he has been taking losartan 50 mg twice a day. He admits that he has not been taking his coreg regularly and has missed a few doses of losartan in the past week. Patient denies headache chest pain shortness of breath or any strokelike symptoms. Blood pressure has been over 200/100 on multiple checks over the past few days. BP was 208/140 when he arrived at ER today.Patient has had Covid vaccines. He had an appointment today with PCP for follow up but the appointment was cancelled by the PCP office. He then went to urgent care with concerns about his BP and was sent to ER by the urgent care. In the ER he was given his evening dose of coreg and losartan but BP has remained over 200/100.   Assessment & Plan: Principal Problem:   Hypertensive urgency Active Problems:   CKD (chronic kidney disease), stage IV (HCC)  HTN urgency: Severe HTN with headache this morning. Improving control with addition of po hydralazine without too rapid of decrease thus far.  - Check orthostatics given propensity to falls and lower BP. - Continue home coreg, monitor telemetry with mild bradycardia - Continue formulary equivalent ARB - Started hydralazine 25mg  po q8h. Pt's wife reports she'll be able to enforce this dosing regimen. - Monitor on new regimen x24 hours.   Hypokalemia: Worsening, ?if related to hyperaldosteronism.  - Aldo/PRA pending. Cortisol wnl this AM.  - Consider spironolactone - Supplement  today and monitor closely in the setting of CKD.   Weakness: This seems to be significant at this time, suspected to be due to hypokalemia.  - PT consulted.   Stage IV CKD: At baseline - Continue home medications  Cognitive impairment: NOS, no formal diagnosis of dementia.  - Pending outpatient evaluation per pt's wife.   DVT prophylaxis: SCDs Code Status: Full Family Communication: Wife by phone multiple times today Disposition Plan:  Status is: Observation  The patient will require care spanning > 2 midnights and should be moved to inpatient because: Persistent severe electrolyte disturbances  Dispo: The patient is from: Home              Anticipated d/c is to: Home              Anticipated d/c date is: 1 day              Patient currently is not medically stable to d/c.  Consultants:   None  Procedures:   None  Antimicrobials:  None   Subjective: Pt with headache this morning when BP increased, has improved with decreasing BP. Some lightheadedness when standing. No chest pain or leg swelling or palpitations.   Objective: Vitals:   10/08/20 0941 10/08/20 1108 10/08/20 1200 10/08/20 1421  BP: (!) 172/101 (!) 176/109  132/75  Pulse: (!) 56 (!) 50 76 65  Resp: 13  20 18   Temp: 98.2 F (36.8 C)   97.6 F (36.4 C)  TempSrc: Oral   Oral  SpO2: 100%   100%  Weight:      Height:        Intake/Output Summary (Last 24 hours) at 10/08/2020 1545 Last data filed at 10/08/2020 1011 Gross per 24 hour  Intake --  Output 325 ml  Net -325 ml   Filed Weights   10/07/20 1439 10/08/20 0045  Weight: 94.3 kg 79.1 kg    Gen: 69 y.o. male in no distress Pulm: Non-labored breathing. Clear to auscultation bilaterally.  CV: Regular rate and rhythm. No murmur, rub, or gallop. No JVD, no pedal edema. GI: Abdomen soft, non-tender, non-distended, with normoactive bowel sounds. No organomegaly or masses felt. Ext: Warm, no deformities Skin: No rashes, lesions or ulcers Neuro:  Alert and oriented, diffusely very weak. No focal neurological deficits. Psych: Judgement and insight appear normal. Mood & affect appropriate.   Data Reviewed: I have personally reviewed following labs and imaging studies  CBC: Recent Labs  Lab 10/07/20 1757  WBC 6.5  NEUTROABS 5.1  HGB 12.5*  HCT 37.3*  MCV 94.4  PLT 025   Basic Metabolic Panel: Recent Labs  Lab 10/07/20 1757 10/08/20 0932  NA 140 141  K 3.3* 2.8*  CL 101 104  CO2 28 28  GLUCOSE 103* 101*  BUN 41* 33*  CREATININE 2.44* 1.99*  CALCIUM 10.6* 10.3   GFR: Estimated Creatinine Clearance: 35.2 mL/min (A) (by C-G formula based on SCr of 1.99 mg/dL (H)). Liver Function Tests: No results for input(s): AST, ALT, ALKPHOS, BILITOT, PROT, ALBUMIN in the last 168 hours. No results for input(s): LIPASE, AMYLASE in the last 168 hours. No results for input(s): AMMONIA in the last 168 hours. Coagulation Profile: No results for input(s): INR, PROTIME in the last 168 hours. Cardiac Enzymes: No results for input(s): CKTOTAL, CKMB, CKMBINDEX, TROPONINI in the last 168 hours. BNP (last 3 results) No results for input(s): PROBNP in the last 8760 hours. HbA1C: No results for input(s): HGBA1C in the last 72 hours. CBG: No results for input(s): GLUCAP in the last 168 hours. Lipid Profile: No results for input(s): CHOL, HDL, LDLCALC, TRIG, CHOLHDL, LDLDIRECT in the last 72 hours. Thyroid Function Tests: No results for input(s): TSH, T4TOTAL, FREET4, T3FREE, THYROIDAB in the last 72 hours. Anemia Panel: No results for input(s): VITAMINB12, FOLATE, FERRITIN, TIBC, IRON, RETICCTPCT in the last 72 hours. Urine analysis:    Component Value Date/Time   COLORURINE COLORLESS (A) 10/07/2020 2149   APPEARANCEUR CLEAR 10/07/2020 2149   LABSPEC 1.008 10/07/2020 2149   PHURINE 7.0 10/07/2020 2149   GLUCOSEU 50 (A) 10/07/2020 2149   GLUCOSEU NEGATIVE 03/28/2018 1206   HGBUR SMALL (A) 10/07/2020 2149   BILIRUBINUR NEGATIVE  10/07/2020 2149   KETONESUR NEGATIVE 10/07/2020 2149   PROTEINUR NEGATIVE 10/07/2020 2149   UROBILINOGEN 0.2 03/28/2018 1206   NITRITE NEGATIVE 10/07/2020 2149   LEUKOCYTESUR NEGATIVE 10/07/2020 2149   Recent Results (from the past 240 hour(s))  Respiratory Panel by RT PCR (Flu A&B, Covid) - Nasopharyngeal Swab     Status: None   Collection Time: 10/07/20 10:44 PM   Specimen: Nasopharyngeal Swab  Result Value Ref Range Status   SARS Coronavirus 2 by RT PCR NEGATIVE NEGATIVE Final    Comment: (NOTE) SARS-CoV-2 target nucleic acids are NOT DETECTED.  The SARS-CoV-2 RNA is generally detectable in upper respiratoy specimens during the acute phase of infection. The lowest concentration of SARS-CoV-2 viral copies this assay can detect is 131 copies/mL. A negative result does not preclude SARS-Cov-2 infection and should not be used as the sole  basis for treatment or other patient management decisions. A negative result may occur with  improper specimen collection/handling, submission of specimen other than nasopharyngeal swab, presence of viral mutation(s) within the areas targeted by this assay, and inadequate number of viral copies (<131 copies/mL). A negative result must be combined with clinical observations, patient history, and epidemiological information. The expected result is Negative.  Fact Sheet for Patients:  PinkCheek.be  Fact Sheet for Healthcare Providers:  GravelBags.it  This test is no t yet approved or cleared by the [place] FDA and  has been authorized for detection and/or diagnosis of SARS-CoV-2 by FDA under an Emergency Use Authorization (EUA). This EUA will remain  in effect (meaning this test can be used) for the duration of the COVID-19 declaration under Section 564(b)(1) of the Act, 21 U.S.C. section 360bbb-3(b)(1), unless the authorization is terminated or revoked sooner.     Influenza A by  PCR NEGATIVE NEGATIVE Final   Influenza B by PCR NEGATIVE NEGATIVE Final    Comment: (NOTE) The Xpert Xpress SARS-CoV-2/FLU/RSV assay is intended as an aid in  the diagnosis of influenza from Nasopharyngeal swab specimens and  should not be used as a sole basis for treatment. Nasal washings and  aspirates are unacceptable for Xpert Xpress SARS-CoV-2/FLU/RSV  testing.  Fact Sheet for Patients: PinkCheek.be  Fact Sheet for Healthcare Providers: GravelBags.it  This test is not yet approved or cleared by the [place] FDA and  has been authorized for detection and/or diagnosis of SARS-CoV-2 by  FDA under an Emergency Use Authorization (EUA). This EUA will remain  in effect (meaning this test can be used) for the duration of the  Covid-19 declaration under Section 564(b)(1) of the Act, 21  U.S.C. section 360bbb-3(b)(1), unless the authorization is  terminated or revoked. Performed at [facility], [place] [address] [place], [place] [address]       Radiology Studies: DG Chest 2 View  Result Date: 10/07/2020 CLINICAL DATA:  Hypertension EXAM: CHEST - 2 VIEW COMPARISON:  06/17/2020 FINDINGS: The heart size and mediastinal contours are within normal limits. Both lungs are clear. The visualized skeletal structures are unremarkable. IMPRESSION: No active cardiopulmonary disease. Electronically Signed   By: Donavan Foil M.D.   On: 10/07/2020 17:58    Scheduled Meds: . carvedilol  6.25 mg Oral BID WC  . hydrALAZINE  25 mg Oral Q8H  . irbesartan  150 mg Oral Daily  . oxybutynin  5 mg Oral QHS  . potassium chloride  40 mEq Oral BID   Continuous Infusions:   LOS: 0 days   Time spent: 25 minutes.  Patrecia Pour, MD [facility] [email or web address] 10/08/2020, 3:45 PM

## 2020-10-08 NOTE — Plan of Care (Signed)

## 2020-10-09 ENCOUNTER — Other Ambulatory Visit: Payer: Self-pay | Admitting: Family Medicine

## 2020-10-09 DIAGNOSIS — N184 Chronic kidney disease, stage 4 (severe): Secondary | ICD-10-CM | POA: Diagnosis not present

## 2020-10-09 DIAGNOSIS — I160 Hypertensive urgency: Secondary | ICD-10-CM | POA: Diagnosis not present

## 2020-10-09 DIAGNOSIS — E876 Hypokalemia: Secondary | ICD-10-CM | POA: Diagnosis not present

## 2020-10-09 LAB — BASIC METABOLIC PANEL
Anion gap: 6 (ref 5–15)
BUN: 39 mg/dL — ABNORMAL HIGH (ref 8–23)
CO2: 27 mmol/L (ref 22–32)
Calcium: 10.1 mg/dL (ref 8.9–10.3)
Chloride: 105 mmol/L (ref 98–111)
Creatinine, Ser: 2.37 mg/dL — ABNORMAL HIGH (ref 0.61–1.24)
GFR, Estimated: 29 mL/min — ABNORMAL LOW (ref >60)
Glucose, Bld: 111 mg/dL — ABNORMAL HIGH (ref 70–99)
Potassium: 3.6 mmol/L (ref 3.5–5.1)
Sodium: 138 mmol/L (ref 135–145)

## 2020-10-09 MED ORDER — HYDRALAZINE HCL 50 MG PO TABS
50.0000 mg | ORAL_TABLET | Freq: Three times a day (TID) | ORAL | Status: DC
  Administered 2020-10-09: 50 mg via ORAL
  Filled 2020-10-09: qty 1

## 2020-10-09 MED ORDER — CARVEDILOL 6.25 MG PO TABS
6.2500 mg | ORAL_TABLET | Freq: Two times a day (BID) | ORAL | 0 refills | Status: DC
Start: 2020-10-09 — End: 2020-10-09

## 2020-10-09 MED ORDER — LOSARTAN POTASSIUM 50 MG PO TABS: 50.0000 mg | ORAL_TABLET | Freq: Two times a day (BID) | ORAL | Status: DC

## 2020-10-09 MED ORDER — HYDRALAZINE HCL 50 MG PO TABS
50.0000 mg | ORAL_TABLET | Freq: Three times a day (TID) | ORAL | 0 refills | Status: DC
Start: 2020-10-09 — End: 2020-11-03

## 2020-10-09 MED ORDER — HYDRALAZINE HCL 25 MG PO TABS
25.0000 mg | ORAL_TABLET | Freq: Once | ORAL | Status: AC
  Administered 2020-10-09: 25 mg via ORAL
  Filled 2020-10-09: qty 1

## 2020-10-09 NOTE — Discharge Instructions (Signed)
You should not take medications in the class of NSAIDs, which includes naproxen, as this damages your kidneys.

## 2020-10-09 NOTE — Evaluation (Signed)
Physical Therapy Evaluation Patient Details Name: Casey Greene MRN: [id number] DOB: Jul 27, 1951 Today's Date: 10/09/2020   History of Present Illness  69 y.o. male with medical history significant for hypertension and chronic kidney disease stage 4, chronic Hepatitis C, aortic dilatation on u/s in 2020. admitted with Hypertensive urgency  Clinical Impression  Pt admitted with above diagnosis.  Pt amb ~ 110' with RW and min to min/guard assist  (amb with SPC at baseline). Presents with some higher level balance deficits, currently quite  reliant on RW. Recommend RW for home and HHPT.  Pt currently with functional limitations due to the deficits listed below (see PT Problem List). Pt will benefit from skilled PT to increase their independence and safety with mobility to allow discharge to the venue listed below.       Follow Up Recommendations Home health PT;Supervision for mobility/OOB    Equipment Recommendations  Rolling walker with 5" wheels    Recommendations for Other Services       Precautions / Restrictions Precautions Precautions: Fall      Mobility  Bed Mobility Overal bed mobility: Needs Assistance Bed Mobility: Supine to Sit     Supine to sit: Modified independent (Device/Increase time);HOB elevated     General bed mobility comments: incr time and effort, able to complete without physical assist or use of rails    Transfers Overall transfer level: Needs assistance Equipment used: Rolling walker (2 wheeled) Transfers: Sit to/from Stand Sit to Stand: Min guard         General transfer comment: incr time, cues for hand placement and overall safety  Ambulation/Gait Ambulation/Gait assistance: Min assist Gait Distance (Feet): 110 Feet Assistive device: Rolling walker (2 wheeled) Gait Pattern/deviations: Step-through pattern;Decreased stride length;Drifts right/left;Narrow base of support     General Gait Details: slow gait, mildly unsteady and guarded  however no overt LOB with moderate reliance on RW/UE support  Stairs            Wheelchair Mobility    Modified Rankin (Stroke Patients Only)       Balance Overall balance assessment: Needs assistance Sitting-balance support: No upper extremity supported;Feet supported Sitting balance-Leahy Scale: Good       Standing balance-Leahy Scale: Poor Standing balance comment: reliant on UEs                             Pertinent Vitals/Pain Pain Assessment: No/denies pain    Home Living Family/patient expects to be discharged to:: Private residence Living Arrangements: Spouse/significant other Available Help at Discharge: Family;Available 24 hours/day Type of Home: House Home Access: Stairs to enter   [employer] of Steps: "not many" Home Layout: One level Home Equipment: Cane - single point      Prior Function Level of Independence: Independent with assistive device(s)         Comments: amb with cane     Hand Dominance        Extremity/Trunk Assessment   Upper Extremity Assessment Upper Extremity Assessment: Overall WFL for tasks assessed    Lower Extremity Assessment Lower Extremity Assessment: Generalized weakness       Communication   Communication: No difficulties  Cognition Arousal/Alertness: Awake/alert Behavior During Therapy: WFL for tasks assessed/performed Overall Cognitive Status: Within Functional Limits for tasks assessed  General Comments      Exercises     Assessment/Plan    PT Assessment Patient needs continued PT services  PT Problem List Decreased strength;Decreased range of motion;Decreased activity tolerance;Decreased balance;Decreased knowledge of use of DME;Decreased mobility       PT Treatment Interventions Gait training;Functional mobility training;Balance training;DME instruction;Therapeutic activities;Therapeutic exercise    PT Goals  (Current goals can be found in the Care Plan section)  Acute Rehab PT Goals Patient Stated Goal: home PT Goal Formulation: With patient Time For Goal Achievement: 10/23/20 Potential to Achieve Goals: Good    Frequency Min 3X/week   Barriers to discharge        Co-evaluation               AM-PAC PT "6 Clicks" Mobility  Outcome Measure Help needed turning from your back to your side while in a flat bed without using bedrails?: None Help needed moving from lying on your back to sitting on the side of a flat bed without using bedrails?: A Little Help needed moving to and from a bed to a chair (including a wheelchair)?: A Little Help needed standing up from a chair using your arms (e.g., wheelchair or bedside chair)?: A Little Help needed to walk in hospital room?: A Little Help needed climbing 3-5 steps with a railing? : A Little 6 Click Score: 19    End of Session Equipment Utilized During Treatment: Gait belt Activity Tolerance: Patient tolerated treatment well Patient left: in chair;with call bell/phone within reach;with chair alarm set Nurse Communication: Mobility status PT Visit Diagnosis: Difficulty in walking, not elsewhere classified (R26.2)    Time: 9509-3267 PT Time Calculation (min) (ACUTE ONLY): 17 min   Charges:   PT Evaluation $PT Eval Low Complexity: [place], PT  Acute Rehab Dept ([place]) [phone] Pager [phone]  10/09/2020   [facility] 10/09/2020, 10:38 AM

## 2020-10-09 NOTE — TOC Transition Note (Addendum)
Transition of Care [facility]) - CM/SW Discharge Note   Patient Details  Name: Casey Greene MRN: [id number] Date of Birth: [birth year]  Transition of Care [facility]) CM/SW Contact:  Shade Flood, LCSW Phone Number: 10/09/2020, 11:51 AM   Clinical Narrative:     Pt stable for dc home today per MD. PT recommending HHPT and RW for dc. Spoke with pt to discuss. Pt in agreement. Informed pt of CMS and in-network insurance options for [facility]. Referred to [facility]. Arranged RW with [employer].   There are no other TOC needs identified for dc.  12:47 Spoke with pt's wife who asked if pt could use the RW they have at home from when she had TKA. Since this is the preference, TOC canceled order that had been placed with [employer]. Pt's wife states she will bring the RW with her when she comes to get pt.    Expected Discharge Plan: [place] Barriers to Discharge: Barriers Resolved   Patient Goals and CMS Choice Patient states their goals for this hospitalization and ongoing recovery are:: go home CMS Medicare.gov Compare Post Acute Care list provided to:: Patient Choice offered to / list presented to : Patient  Expected Discharge Plan and Services Expected Discharge Plan: [place] In-house Referral: Clinical Social Work   Post Acute Care Choice: [place] arrangements for the past 2 months: Apartment Expected Discharge Date: 10/09/20               DME Arranged: Gilford Rile rolling DME Agency: [employer] Date DME Agency Contacted: 10/09/20   Representative spoke with at DME Agency: Sunday rep [place] Arranged: PT [place]: [place] Date [place]: 10/09/20   Representative spoke with at [place]: Tommi Rumps  Prior Living Arrangements/Services Living arrangements for the past 2 months: Apartment Lives with:: Spouse Patient language and need for interpreter reviewed:: Yes Do you feel safe going back to the place where you live?: Yes      Need  for Family Participation in Patient Care: Yes (Comment) Care giver support system in place?: Yes (comment)   Criminal Activity/Legal Involvement Pertinent to Current Situation/Hospitalization: No - Comment as needed  Activities of Daily Living Home Assistive Devices/Equipment: Eyeglasses, Cane (specify quad or straight) (depends) ADL Screening (condition at time of admission) Patient's cognitive ability adequate to safely complete daily activities?: Yes Is the patient deaf or have difficulty hearing?: No Does the patient have difficulty seeing, even when wearing glasses/contacts?: No Does the patient have difficulty concentrating, remembering, or making decisions?: Yes (slight) Patient able to express need for assistance with ADLs?: Yes Does the patient have difficulty dressing or bathing?: No Independently performs ADLs?: No Communication: Independent Dressing (OT): Independent Grooming: Independent Feeding: Independent Bathing: Independent Toileting: Needs assistance Is this a change from baseline?: Pre-admission baseline In/Out Bed: Needs assistance Is this a change from baseline?: Pre-admission baseline Walks in Home: Needs assistance (uses a cane) Is this a change from baseline?: Pre-admission baseline Does the patient have difficulty walking or climbing stairs?: Yes Weakness of Legs: None Weakness of Arms/Hands: None  Permission Sought/Granted Permission sought to share information with : Facility Art therapist granted to share information with : Yes, Verbal Permission Granted     Permission granted to share info w AGENCY: Alvis Lemmings        Emotional Assessment   Attitude/Demeanor/Rapport: Engaged Affect (typically observed): Pleasant Orientation: : Oriented to Self, Oriented to Place, Oriented to  Time, Oriented to Situation Alcohol /  Substance Use: Not Applicable Psych Involvement: No (comment)  Admission diagnosis:  Hypertensive urgency  [I16.0] Hypokalemia [E87.6] Patient Active Problem List   Diagnosis Date Noted  . Hypertensive urgency 10/07/2020  . Laryngeal mass 06/17/2020  . DOE (dyspnea on exertion) 06/17/2020  . NSVT (nonsustained ventricular tachycardia) ([place])   . CKD (chronic kidney disease), stage IV ([place])   . Hypertension   . PAC (premature atrial contraction)   . LVH (left ventricular hypertrophy) due to hypertensive disease   . Ascending aorta dilation (HCC) 07/29/2019  . Nonsustained ventricular tachycardia ([place]) 07/23/2019  . Elevated hemoglobin A1c 06/11/2019  . Irregular heart rate 06/11/2019  . Corn or callus 06/11/2019  . Bleeding per rectum 12/09/2018  . Hypokalemia 12/09/2018  . Screen for colon cancer 12/09/2018  . Constipation 12/01/2018  . Mass of thyroid region 03/28/2018  . Anemia of chronic disease 03/28/2018  . Noncompliance by refusing intervention or support 10/22/2016  . Refusal of medication 10/22/2016  . Edema 07/09/2016  . Depression 02/27/2016  . Prostate cancer ([place]) 01/26/2016  . Stage 3b chronic kidney disease ([place]) 10/13/2014  . Chronic hepatitis C without hepatic coma ([place]) 07/05/2014  . Hyperlipidemia 06/29/2014  . Essential hypertension 06/09/2014  . Onychomycosis 12/01/2013  . Pain, foot 12/01/2013  . PVD (peripheral vascular disease) ([place]) 12/01/2013   PCP:  Libby Maw, MD Pharmacy:   [facility] [phone] - [place], [address] AT [facility] [address] [place] [address] Phone: [phone] Fax: [phone]     Social Determinants of Health ([place]) Interventions    Readmission Risk Interventions Readmission Risk Prevention Plan 10/09/2020  Medication Screening Complete  Transportation Screening Complete  Some recent data might be hidden    Final next level of care: Home w Home Health Services Barriers to Discharge: Barriers Resolved   Patient Goals and CMS Choice Patient states their  goals for this hospitalization and ongoing recovery are:: go home CMS Medicare.gov Compare Post Acute Care list provided to:: Patient Choice offered to / list presented to : Patient  Discharge Placement                       Discharge Plan and Services In-house Referral: Clinical Social Work   Post Acute Care Choice: Home Health          DME Arranged: Gilford Rile rolling DME Agency: [employer] Date DME Agency Contacted: 10/09/20   Representative spoke with at DME Agency: Sunday rep [place] Arranged: PT [place]: [place] Date [place]: 10/09/20   Representative spoke with at [place]: [place] ([place]) Interventions     Readmission Risk Interventions Readmission Risk Prevention Plan 10/09/2020  Medication Screening Complete  Transportation Screening Complete  Some recent data might be hidden

## 2020-10-09 NOTE — Plan of Care (Signed)
Patient has met objectives/goals for discharge.

## 2020-10-09 NOTE — Discharge Summary (Signed)
Physician Discharge Summary  Casey Greene DVV:[id number] DOB: [birth year] DOA: 10/07/2020  PCP: Libby Maw, MD  Admit date: 10/07/2020 Discharge date: 10/09/2020  Admitted From: Home Disposition: Home   Recommendations for Outpatient Follow-up:  1. Follow up with PCP with BMP in the next week for BP recheck and resistant HTN management. 2. Consider neuropsychiatry evaluation  Home Health: PT Equipment/Devices: Rolling walker from home Discharge Condition: Stable CODE STATUS: Full Diet recommendation: Heart healthy, renal  Brief/Interim Summary: Casey Hooperis a 69 y.o.malewith medical history significant forhypertension andchronic kidney diseasestage 4, chronic Hepatitis C, aortic dilatation on u/s in 2020. Patient primary care provider Dr. Ethelene Hal had patient increase his blood pressure regimen on November 5by increasing losartan from 50 mg to 100 mg.He was continued onCoreg 6.25 mg twice a day. Patient reports he has been taking losartan50 mg twice a day. He admits that he has not been taking his coreg regularly and has missed a few doses of losartan in the past week.Patient denies headache chest pain shortness of breath or any strokelike symptoms. Blood pressurehas been over 200/100 on multiple checks over the past few days. BP was 208/140 when he arrived at ER today.Patient has had Covid vaccines.He had an appointment today with PCP for follow up but the appointment was cancelled by the PCP office. He then went to urgent care with concerns about his BP and was sent to ER by the urgent care. In the ER he was given his evening dose of coreg and losartan but BP has remained over 200/100.With addition of hydralazine, BP improvement without orthostatic hypotension was achieved. This was prescribed with plans for close outpatient follow up.   Discharge Diagnoses:  Principal Problem:   Hypertensive urgency Active Problems:   Hypokalemia   CKD (chronic kidney  disease), stage IV (HCC)  HTN urgency: Severe HTN with headache this morning. Improving control with addition of po hydralazine without too rapid of decrease thus far.  - Continue home coreg, no pauses on telemetry. - Continue ARB - Started hydralazine 25mg  po q8h. Pt's wife reports she'll be able to enforce this dosing regimen.  Hypokalemia:  - Aldo/PRA pending. Cortisol wnl. Consider hyperaldosteronism work up, empiric spironolactone high risk with renal impairment. - Supplemented and resolved.  Weakness:  - PT recommends home health PT which is arranged at discharge.   Stage IV CKD: At baseline - Continue home medications  Cognitive impairment: NOS, no formal diagnosis of dementia.  - Pending outpatient evaluation per pt's wife.    Discharge Instructions Discharge Instructions    Diet - low sodium heart healthy   Complete by: As directed    Discharge instructions   Complete by: As directed    You were admitted for severely elevated blood pressure which has improved with the addition of a medication called hydralazine. You should continue taking losartan 50mg  twice daily and coreg twice daily (new prescription for this sent to your pharmacy), and also start taking hydralazine 50mg  three times per day (sent to your [place]). Please check your blood pressure several times per day, recording values to provide to your doctor. Please call your PCP's office tomorrow morning to have an appointment for BP monitoring early this week. Your cardiologist's office would also be able to assist with management.   Seek medical attention if your blood pressure raises above 180/110 consistently, if you develop chest pain, difficulty breathing, or headache or vision changes.   Increase activity slowly   Complete by: As directed  Allergies as of 10/09/2020      Reactions   Amlodipine Other (See Comments)   headache   Tamsulosin Hcl       Medication List    STOP taking  these medications   hydrocortisone 2.5 % rectal cream Commonly known as: ANUSOL-HC   naproxen sodium 220 MG tablet Commonly known as: ALEVE     TAKE these medications   calcitRIOL 0.25 MCG capsule Commonly known as: ROCALTROL Take 1 capsule (0.25 mcg total) by mouth daily.   CALTRATE 600+D PLUS PO Take 1 tablet by mouth daily.   carvedilol 6.25 MG tablet Commonly known as: COREG Take 1 tablet (6.25 mg total) by mouth 2 (two) times daily with a meal.   hydrALAZINE 50 MG tablet Commonly known as: APRESOLINE Take 1 tablet (50 mg total) by mouth every 8 (eight) hours.   losartan 50 MG tablet Commonly known as: COZAAR Take 1 tablet (50 mg total) by mouth 2 (two) times daily. What changed: See the new instructions.   oxybutynin 5 MG tablet Commonly known as: DITROPAN Take 5 mg by mouth at bedtime.            Durable Medical Equipment  (From admission, onward)         Start     Ordered   10/09/20 1114  For home use only DME Walker rolling  Once       Question Answer Comment  Walker: With [place] Wheels   Patient needs a walker to treat with the following condition Gait instability      10/09/20 1113          Follow-up Information    Libby Maw, MD. Schedule an appointment as soon as possible for a visit.   Specialty: Family Medicine Contact information: [place] [address] [phone]        Park Liter, MD .   Specialty: Cardiology Contact information: [place] [address] [phone]        Ronnald Nian, DO. Call.   Specialty: Family Medicine Contact information: [place] [address] [phone]        Care, [facility] Follow up.   Specialty: [place] Why: [facility] staff will call you to arrange in home physical therapy visits Contact information: [address] [place]  [address] [phone]              Allergies  Allergen Reactions  . Amlodipine Other (See Comments)    headache  . Tamsulosin Hcl     Consultations:  None  Procedures/Studies: DG Chest 2 View  Result Date: 10/07/2020 CLINICAL DATA:  Hypertension EXAM: CHEST - 2 VIEW COMPARISON:  06/17/2020 FINDINGS: The heart size and mediastinal contours are within normal limits. Both lungs are clear. The visualized skeletal structures are unremarkable. IMPRESSION: No active cardiopulmonary disease. Electronically Signed   By: Donavan Foil M.D.   On: 10/07/2020 17:58      Subjective: No complaints this AM. Denies chest pain, leg swelling, orthopnea, PND, palpitations, head ache and vision changes.   Discharge Exam: Vitals:   10/09/20 0505 10/09/20 0507  BP: (!) 168/99 (!) 172/97  Pulse: (!) 58 (!) 58  Resp: 18   Temp: 98.4 F (36.9 C)   SpO2: 100%    General: Pt is alert, awake, not in acute distress Cardiovascular: RRR, S1/S2 +, no rubs, no gallops Respiratory: CTA bilaterally, no  wheezing, no rhonchi Abdominal: Soft, NT, ND, bowel sounds + Extremities: No edema, no cyanosis  Labs: BNP (last 3 results) No results for input(s): BNP in the last 8760 hours. Basic Metabolic Panel: Recent Labs  Lab 10/07/20 1757 10/08/20 0932 10/09/20 0818  NA 140 141 138  K 3.3* 2.8* 3.6  CL 101 104 105  CO2 28 28 27   GLUCOSE 103* 101* 111*  BUN 41* 33* 39*  CREATININE 2.44* 1.99* 2.37*  CALCIUM 10.6* 10.3 10.1   Liver Function Tests: No results for input(s): AST, ALT, ALKPHOS, BILITOT, PROT, ALBUMIN in the last 168 hours. No results for input(s): LIPASE, AMYLASE in the last 168 hours. No results for input(s): AMMONIA in the last 168 hours. CBC: Recent Labs  Lab 10/07/20 1757  WBC 6.5  NEUTROABS 5.1  HGB 12.5*  HCT 37.3*  MCV 94.4  PLT 170   Cardiac Enzymes: No results for input(s): CKTOTAL, CKMB, CKMBINDEX, TROPONINI in the last 168 hours. BNP: Invalid input(s):  POCBNP CBG: No results for input(s): GLUCAP in the last 168 hours. D-Dimer No results for input(s): DDIMER in the last 72 hours. Hgb A1c No results for input(s): HGBA1C in the last 72 hours. Lipid Profile No results for input(s): CHOL, HDL, LDLCALC, TRIG, CHOLHDL, LDLDIRECT in the last 72 hours. Thyroid function studies No results for input(s): TSH, T4TOTAL, T3FREE, THYROIDAB in the last 72 hours.  Invalid input(s): FREET3 Anemia work up No results for input(s): VITAMINB12, FOLATE, FERRITIN, TIBC, IRON, RETICCTPCT in the last 72 hours. Urinalysis    Component Value Date/Time   COLORURINE COLORLESS (A) 10/07/2020 2149   APPEARANCEUR CLEAR 10/07/2020 2149   LABSPEC 1.008 10/07/2020 2149   PHURINE 7.0 10/07/2020 2149   GLUCOSEU 50 (A) 10/07/2020 2149   GLUCOSEU NEGATIVE 03/28/2018 1206   HGBUR SMALL (A) 10/07/2020 2149   BILIRUBINUR NEGATIVE 10/07/2020 2149   KETONESUR NEGATIVE 10/07/2020 2149   PROTEINUR NEGATIVE 10/07/2020 2149   UROBILINOGEN 0.2 03/28/2018 1206   NITRITE NEGATIVE 10/07/2020 2149   LEUKOCYTESUR NEGATIVE 10/07/2020 2149    Microbiology Recent Results (from the past 240 hour(s))  Respiratory Panel by RT PCR (Flu A&B, Covid) - Nasopharyngeal Swab     Status: None   Collection Time: 10/07/20 10:44 PM   Specimen: Nasopharyngeal Swab  Result Value Ref Range Status   SARS Coronavirus 2 by RT PCR NEGATIVE NEGATIVE Final    Comment: (NOTE) SARS-CoV-2 target nucleic acids are NOT DETECTED.  The SARS-CoV-2 RNA is generally detectable in upper respiratoy specimens during the acute phase of infection. The lowest concentration of SARS-CoV-2 viral copies this assay can detect is 131 copies/mL. A negative result does not preclude SARS-Cov-2 infection and should not be used as the sole basis for treatment or other patient management decisions. A negative result may occur with  improper specimen collection/handling, submission of specimen other than nasopharyngeal  swab, presence of viral mutation(s) within the areas targeted by this assay, and inadequate number of viral copies (<131 copies/mL). A negative result must be combined with clinical observations, patient history, and epidemiological information. The expected result is Negative.  Fact Sheet for Patients:  PinkCheek.be  Fact Sheet for Healthcare Providers:  GravelBags.it  This test is no t yet approved or cleared by the [place] FDA and  has been authorized for detection and/or diagnosis of SARS-CoV-2 by FDA under an Emergency Use Authorization (EUA). This EUA will remain  in effect (meaning this test can be used) for the duration of the COVID-19 declaration under  Section 564(b)(1) of the Act, 21 U.S.C. section 360bbb-3(b)(1), unless the authorization is terminated or revoked sooner.     Influenza A by PCR NEGATIVE NEGATIVE Final   Influenza B by PCR NEGATIVE NEGATIVE Final    Comment: (NOTE) The Xpert Xpress SARS-CoV-2/FLU/RSV assay is intended as an aid in  the diagnosis of influenza from Nasopharyngeal swab specimens and  should not be used as a sole basis for treatment. Nasal washings and  aspirates are unacceptable for Xpert Xpress SARS-CoV-2/FLU/RSV  testing.  Fact Sheet for Patients: PinkCheek.be  Fact Sheet for Healthcare Providers: GravelBags.it  This test is not yet approved or cleared by the [place] FDA and  has been authorized for detection and/or diagnosis of SARS-CoV-2 by  FDA under an Emergency Use Authorization (EUA). This EUA will remain  in effect (meaning this test can be used) for the duration of the  Covid-19 declaration under Section 564(b)(1) of the Act, 21  U.S.C. section 360bbb-3(b)(1), unless the authorization is  terminated or revoked. Performed at [facility], [place] [address], [place], [place] [address]      Time coordinating discharge: Approximately 40 minutes  Patrecia Pour, MD  [facility] 10/09/2020, 4:07 PM

## 2020-10-10 ENCOUNTER — Telehealth: Payer: Self-pay

## 2020-10-10 NOTE — Telephone Encounter (Signed)
First attempt TCM call. No answer 

## 2020-10-11 ENCOUNTER — Telehealth: Payer: Self-pay

## 2020-10-11 DIAGNOSIS — I491 Atrial premature depolarization: Secondary | ICD-10-CM | POA: Diagnosis not present

## 2020-10-11 DIAGNOSIS — I951 Orthostatic hypotension: Secondary | ICD-10-CM | POA: Diagnosis not present

## 2020-10-11 DIAGNOSIS — M1030 Gout due to renal impairment, unspecified site: Secondary | ICD-10-CM | POA: Diagnosis not present

## 2020-10-11 DIAGNOSIS — I77819 Aortic ectasia, unspecified site: Secondary | ICD-10-CM | POA: Diagnosis not present

## 2020-10-11 DIAGNOSIS — M160 Bilateral primary osteoarthritis of hip: Secondary | ICD-10-CM | POA: Diagnosis not present

## 2020-10-11 DIAGNOSIS — N184 Chronic kidney disease, stage 4 (severe): Secondary | ICD-10-CM | POA: Diagnosis not present

## 2020-10-11 DIAGNOSIS — I1310 Hypertensive heart and chronic kidney disease without heart failure, with stage 1 through stage 4 chronic kidney disease, or unspecified chronic kidney disease: Secondary | ICD-10-CM | POA: Diagnosis not present

## 2020-10-11 DIAGNOSIS — I10 Essential (primary) hypertension: Secondary | ICD-10-CM | POA: Diagnosis not present

## 2020-10-11 DIAGNOSIS — M170 Bilateral primary osteoarthritis of knee: Secondary | ICD-10-CM | POA: Diagnosis not present

## 2020-10-11 DIAGNOSIS — I472 Ventricular tachycardia: Secondary | ICD-10-CM | POA: Diagnosis not present

## 2020-10-11 NOTE — Telephone Encounter (Signed)
Transition Care Management Unsuccessful Follow-up Telephone Call  Date of discharge and from where:  10/09/2020-[place]  Attempts:  3rd Attempt  Reason for unsuccessful TCM follow-up call:  Left voice message

## 2020-10-12 ENCOUNTER — Encounter: Payer: Self-pay | Admitting: Family Medicine

## 2020-10-12 ENCOUNTER — Ambulatory Visit: Payer: Medicare HMO | Admitting: Nurse Practitioner

## 2020-10-12 ENCOUNTER — Ambulatory Visit (INDEPENDENT_AMBULATORY_CARE_PROVIDER_SITE_OTHER): Payer: Medicare HMO | Admitting: Family Medicine

## 2020-10-12 ENCOUNTER — Other Ambulatory Visit: Payer: Self-pay

## 2020-10-12 VITALS — BP 140/82 | HR 71 | Temp 97.8°F | Ht 70.0 in | Wt 182.4 lb

## 2020-10-12 DIAGNOSIS — I10 Essential (primary) hypertension: Secondary | ICD-10-CM | POA: Diagnosis not present

## 2020-10-12 DIAGNOSIS — Z9181 History of falling: Secondary | ICD-10-CM

## 2020-10-12 DIAGNOSIS — R531 Weakness: Secondary | ICD-10-CM | POA: Diagnosis not present

## 2020-10-12 DIAGNOSIS — R2689 Other abnormalities of gait and mobility: Secondary | ICD-10-CM

## 2020-10-12 DIAGNOSIS — R269 Unspecified abnormalities of gait and mobility: Secondary | ICD-10-CM

## 2020-10-12 LAB — ALDOSTERONE + RENIN ACTIVITY W/ RATIO
ALDO / PRA Ratio: 28.7 (ref 0.0–30.0)
Aldosterone: 5 ng/dL (ref 0.0–30.0)
PRA LC/MS/MS: 0.174 ng/mL/hr (ref 0.167–5.380)

## 2020-10-12 NOTE — Progress Notes (Signed)
Chief Complaint  Patient presents with  . Hospitalization Follow-up    follow up from hospital seen for elevated BP, at home PT would like to  know if patient should be wearing support hose Not sure if patient should be taking Losartan with potassium or not.     HPI: Casey Greene is a 69 y.o. male patient of my colleague Dr. Donald Pore who is here for HTN follow-up. Pt was recently admitted to [facility] for HTN urgency 10/07/20 and discharged on 10/09/20. He follows with cardio Dr. Agustin Cree, last seen 09/2019. He has appt on 10/19/20. He also follows with nephrology Dr. Carolin Sicks, last seen in 03/2020. He has upcoming appt.  Pt is on the following meds for HTN: Losartan 50mg  BID Hydralazine 50mg  TID Coreg 6.25mg  BID  BP Readings from Last 3 Encounters:  10/12/20 140/82  10/09/20 (!) 172/97  06/20/20 (!) 151/84   Lab Results  Component Value Date   CREATININE 2.37 (H) 10/09/2020   BUN 39 (H) 10/09/2020   NA 138 10/09/2020   K 3.6 10/09/2020   CL 105 10/09/2020   CO2 27 10/09/2020   Home PT is coming to see pt. PT recommended compression stockings but pt was not given these in hospital or at discharge. Pt is also in need of a shower chair.   Past Medical History:  Diagnosis Date  . Ascending aorta dilation ([place]) 07/29/2019   (a) Echo 07/29/19 59mm  . CKD (chronic kidney disease), stage IV ([place])   . Decreased appetite   . Depression   . Dysrhythmia   . Foley catheter in place   . Hepatitis C   . History of gout   . Hypertension   . Hypertensive heart disease   . LVH (left ventricular hypertrophy) due to hypertensive disease    (a) echo 07/29/19 severe concentric LVH  . NSVT (nonsustained ventricular tachycardia) ([place])    (a) Zio monitor 06/2019. Subsequent echo with severe concentric LVH, but normal EF 55-60%  . PAC (premature atrial contraction)    (a) ZIO 06/2019. Asymptomatic  . Prostate cancer ([place])   . Slow heartbeat   . Urinary retention     Past Surgical History:    Procedure Laterality Date  . ACHILLES TENDON REPAIR    . CERVICAL DISC SURGERY     neck  . COLONOSCOPY     before 2010   . LYMPHADENECTOMY Bilateral 01/26/2016   Procedure: EXTENDED BILATERAL PELVIC LYMPHADENECTOMY;  Surgeon: Raynelle Bring, MD;  Location: [facility];  Service: Urology;  Laterality: Bilateral;  . NOSE SURGERY     in high school-nasal fracturex2  . ROBOT ASSISTED LAPAROSCOPIC RADICAL PROSTATECTOMY N/A 01/26/2016   Procedure: XI ROBOTIC ASSISTED LAPAROSCOPIC RADICAL PROSTATECTOMY LEVEL 3;  Surgeon: Raynelle Bring, MD;  Location: [facility];  Service: Urology;  Laterality: N/A;    Social History   Socioeconomic History  . Marital status: Married    Spouse name: Not on file  . Number of children: Not on file  . Years of education: Not on file  . Highest education level: Not on file  Occupational History  . Not on file  Tobacco Use  . Smoking status: Former Smoker    Packs/day: 0.50    Years: 10.00    Pack years: 5.00    Types: Cigarettes    Quit date: 01/11/2006    Years since quitting: 14.7  . Smokeless tobacco: Never Used  Vaping Use  . Vaping Use: Never used  Substance and Sexual Activity  .  Alcohol use: No  . Drug use: Not Currently  . Sexual activity: Not Currently  Other Topics Concern  . Not on file  Social History Narrative  . Not on file   Social Determinants of Health   Financial Resource Strain:   . Difficulty of Paying Living Expenses: Not on file  Food Insecurity:   . Worried About Charity fundraiser in the Last Year: Not on file  . Ran Out of Food in the Last Year: Not on file  Transportation Needs:   . Lack of Transportation (Medical): Not on file  . Lack of Transportation (Non-Medical): Not on file  Physical Activity:   . Days of Exercise per Week: Not on file  . Minutes of Exercise per Session: Not on file  Stress:   . Feeling of Stress : Not on file  Social Connections:   . Frequency of Communication with Friends and Family: Not on file   . Frequency of Social Gatherings with Friends and Family: Not on file  . Attends Religious Services: Not on file  . Active Member of Clubs or Organizations: Not on file  . Attends Archivist Meetings: Not on file  . Marital Status: Not on file  Intimate Partner Violence:   . Fear of Current or Ex-Partner: Not on file  . Emotionally Abused: Not on file  . Physically Abused: Not on file  . Sexually Abused: Not on file    Family History  Problem Relation Age of Onset  . Heart disease Mother   . Obesity Mother   . Cervical cancer Mother   . Heart disease Father   . Diabetes Father   . ALS Sister   . Colon cancer Neg Hx   . Esophageal cancer Neg Hx   . Colon polyps Neg Hx   . Rectal cancer Neg Hx   . Stomach cancer Neg Hx      Immunization History  Administered Date(s) Administered  . Hepatitis A, Adult 05/01/2018, 01/05/2019  . Hepatitis B, adult 05/01/2018, 06/10/2018, 01/05/2019  . Janssen (J&J) SARS-COV-2 Vaccination 02/29/2020  . Pneumococcal Polysaccharide-23 10/08/2020    Outpatient Encounter Medications as of 10/12/2020  Medication Sig  . calcitRIOL (ROCALTROL) 0.25 MCG capsule TAKE 1 CAPSULE EVERY DAY  . Calcium Carbonate-Vit D-Min (CALTRATE 600+D PLUS PO) Take 1 tablet by mouth daily.   . carvedilol (COREG) 6.25 MG tablet TAKE 1 TABLET TWICE DAILY WITH MEALS  . Ferrous Fumarate (HEMOCYTE - 106 MG FE) 324 (106 Fe) MG TABS tablet Take 1 tablet by mouth.  . hydrALAZINE (APRESOLINE) 50 MG tablet Take 1 tablet (50 mg total) by mouth every 8 (eight) hours.  Marland Kitchen losartan (COZAAR) 50 MG tablet Take 1 tablet (50 mg total) by mouth 2 (two) times daily.  Marland Kitchen oxybutynin (DITROPAN) 5 MG tablet Take 5 mg by mouth at bedtime.   No facility-administered encounter medications on file as of 10/12/2020.     ROS: Pertinent positives and negatives noted in HPI. Remainder of ROS non-contributory    Allergies  Allergen Reactions  . Amlodipine Other (See Comments)     headache  . Tamsulosin Hcl     BP 140/82   Pulse 71   Temp 97.8 F (36.6 C) (Tympanic)   Ht 5\' 10"  (1.778 m)   Wt 182 lb 6.4 oz (82.7 kg)   SpO2 98%   BMI 26.17 kg/m   Physical Exam Constitutional:      General: He is not in acute distress.  Appearance: He is not ill-appearing.  Cardiovascular:     Rate and Rhythm: Normal rate and regular rhythm.     Pulses: Normal pulses.  Pulmonary:     Effort: Pulmonary effort is normal.     Breath sounds: Normal breath sounds. No wheezing or rhonchi.  Musculoskeletal:     Right lower leg: No edema.     Left lower leg: No edema.  Neurological:     Mental Status: He is alert and oriented to person, place, and time. Mental status is at baseline.     Gait: Gait abnormal (ambulates with walker).  Psychiatric:        Behavior: Behavior normal.      A/P:   1. Essential hypertension - much-improved, controlled - cardio appt next week, nephro appt later this month - cont current meds - Losartan 50mg  BID, Hydralazine 50mg  TID, Coreg 6.25mg  BID - hospital records reviewed by me today  2. Generalized weakness 3. At risk for falls 4. Abnormal gait 5. Decreased mobility - has home PT 2x/wk, compression stocking and shower chair were recommended for pt by PT - For home use only DME Other see comment - shower chair - For home use only DME Other see comment - compression stockings

## 2020-10-14 DIAGNOSIS — I1310 Hypertensive heart and chronic kidney disease without heart failure, with stage 1 through stage 4 chronic kidney disease, or unspecified chronic kidney disease: Secondary | ICD-10-CM | POA: Diagnosis not present

## 2020-10-14 DIAGNOSIS — I472 Ventricular tachycardia: Secondary | ICD-10-CM | POA: Diagnosis not present

## 2020-10-14 DIAGNOSIS — M1030 Gout due to renal impairment, unspecified site: Secondary | ICD-10-CM | POA: Diagnosis not present

## 2020-10-14 DIAGNOSIS — M170 Bilateral primary osteoarthritis of knee: Secondary | ICD-10-CM | POA: Diagnosis not present

## 2020-10-14 DIAGNOSIS — I951 Orthostatic hypotension: Secondary | ICD-10-CM | POA: Diagnosis not present

## 2020-10-14 DIAGNOSIS — N184 Chronic kidney disease, stage 4 (severe): Secondary | ICD-10-CM | POA: Diagnosis not present

## 2020-10-14 DIAGNOSIS — I77819 Aortic ectasia, unspecified site: Secondary | ICD-10-CM | POA: Diagnosis not present

## 2020-10-14 DIAGNOSIS — M160 Bilateral primary osteoarthritis of hip: Secondary | ICD-10-CM | POA: Diagnosis not present

## 2020-10-14 DIAGNOSIS — I491 Atrial premature depolarization: Secondary | ICD-10-CM | POA: Diagnosis not present

## 2020-10-14 DIAGNOSIS — M1712 Unilateral primary osteoarthritis, left knee: Secondary | ICD-10-CM | POA: Diagnosis not present

## 2020-10-17 DIAGNOSIS — Z8739 Personal history of other diseases of the musculoskeletal system and connective tissue: Secondary | ICD-10-CM | POA: Insufficient documentation

## 2020-10-17 DIAGNOSIS — M1030 Gout due to renal impairment, unspecified site: Secondary | ICD-10-CM | POA: Diagnosis not present

## 2020-10-17 DIAGNOSIS — R630 Anorexia: Secondary | ICD-10-CM | POA: Insufficient documentation

## 2020-10-17 DIAGNOSIS — I119 Hypertensive heart disease without heart failure: Secondary | ICD-10-CM | POA: Insufficient documentation

## 2020-10-17 DIAGNOSIS — M160 Bilateral primary osteoarthritis of hip: Secondary | ICD-10-CM | POA: Diagnosis not present

## 2020-10-17 DIAGNOSIS — I472 Ventricular tachycardia: Secondary | ICD-10-CM | POA: Diagnosis not present

## 2020-10-17 DIAGNOSIS — I499 Cardiac arrhythmia, unspecified: Secondary | ICD-10-CM | POA: Insufficient documentation

## 2020-10-17 DIAGNOSIS — R001 Bradycardia, unspecified: Secondary | ICD-10-CM | POA: Insufficient documentation

## 2020-10-17 DIAGNOSIS — I77819 Aortic ectasia, unspecified site: Secondary | ICD-10-CM | POA: Diagnosis not present

## 2020-10-17 DIAGNOSIS — N184 Chronic kidney disease, stage 4 (severe): Secondary | ICD-10-CM | POA: Diagnosis not present

## 2020-10-17 DIAGNOSIS — R339 Retention of urine, unspecified: Secondary | ICD-10-CM | POA: Insufficient documentation

## 2020-10-17 DIAGNOSIS — Z978 Presence of other specified devices: Secondary | ICD-10-CM | POA: Insufficient documentation

## 2020-10-17 DIAGNOSIS — M170 Bilateral primary osteoarthritis of knee: Secondary | ICD-10-CM | POA: Diagnosis not present

## 2020-10-17 DIAGNOSIS — I491 Atrial premature depolarization: Secondary | ICD-10-CM | POA: Diagnosis not present

## 2020-10-17 DIAGNOSIS — I951 Orthostatic hypotension: Secondary | ICD-10-CM | POA: Diagnosis not present

## 2020-10-17 DIAGNOSIS — I1310 Hypertensive heart and chronic kidney disease without heart failure, with stage 1 through stage 4 chronic kidney disease, or unspecified chronic kidney disease: Secondary | ICD-10-CM | POA: Diagnosis not present

## 2020-10-17 DIAGNOSIS — B1920 Unspecified viral hepatitis C without hepatic coma: Secondary | ICD-10-CM | POA: Insufficient documentation

## 2020-10-18 ENCOUNTER — Other Ambulatory Visit: Payer: Self-pay

## 2020-10-18 ENCOUNTER — Encounter: Payer: Self-pay | Admitting: Cardiology

## 2020-10-18 ENCOUNTER — Ambulatory Visit (INDEPENDENT_AMBULATORY_CARE_PROVIDER_SITE_OTHER): Payer: Medicare HMO | Admitting: Cardiology

## 2020-10-18 VITALS — BP 142/100 | HR 66 | Ht 70.0 in | Wt 184.0 lb

## 2020-10-18 DIAGNOSIS — I472 Ventricular tachycardia: Secondary | ICD-10-CM | POA: Diagnosis not present

## 2020-10-18 DIAGNOSIS — E782 Mixed hyperlipidemia: Secondary | ICD-10-CM

## 2020-10-18 DIAGNOSIS — I77810 Thoracic aortic ectasia: Secondary | ICD-10-CM

## 2020-10-18 DIAGNOSIS — I119 Hypertensive heart disease without heart failure: Secondary | ICD-10-CM | POA: Diagnosis not present

## 2020-10-18 DIAGNOSIS — I4729 Other ventricular tachycardia: Secondary | ICD-10-CM

## 2020-10-18 NOTE — Progress Notes (Signed)
Cardiology Office Note:    Date:  10/18/2020   ID:  Casey Greene, DOB [birth year], MRN [id number]  PCP:  Libby Maw, MD  Cardiologist:  [name], MD    Referring MD: Libby Maw,*   Chief Complaint  Patient presents with  . Hospitalization Follow-up  I was recently in the hospital  History of Present Illness:    Casey Greene is a 69 y.o. male with past medical history significant for essential hypertension chronic kidney dysfunction aortic aneurysm nonsustained ventricular tachycardia but was noted on the monitor.  His arrhythmia has been stratified with stress testing as well as echocardiogram which showed LVH but no significant other abnormalities.  His last assessment of the aorta showing 42 mm in September last year.  Recently he ended going to the hospital because of high blood pressure.  That was most likely related to the fact that he skip some of his medications.  Past Medical History:  Diagnosis Date  . Anemia of chronic disease 03/28/2018  . Ascending aorta dilation (Casey Greene) 07/29/2019   (a) Echo 07/29/19 54mm  . Bleeding per rectum 12/09/2018  . Chronic hepatitis C without hepatic coma (Casey Greene) 07/05/2014   Overview:  Dr. Alice Reichert, [place]:  Working with digestive health, has liver biopsy scheduled  Formatting of this note might be different from the original. Dr. Alice Reichert, [place]:  Formatting of this note might be different from the original. Working with digestive health, has liver biopsy scheduled  . CKD (chronic kidney disease) stage 3, GFR 30-59 ml/min (HCC) 10/13/2014   Last Assessment & Plan:  Formatting of this note might be different from the original. Discussed with patient importance of good BP control, avoiding nephrotoxic meds/supplements in preventing further progression  . CKD (chronic kidney disease), stage IV ([place])   . Constipation 12/01/2018  . Corn or callus 06/11/2019  .  Decreased appetite   . Depression   . DOE (dyspnea on exertion) 06/17/2020  . Dysrhythmia   . Edema 07/09/2016   Last Assessment & Plan:  Will check Cr/K to ensure CKD is stable.  Edema is stable, encouraged using compression stockings.  Discussed with patient likely multifactorial with Chronic Hep C, CKD playing a role with upcoming ECHO scheduled.  Last Assessment & Plan:  Formatting of this note might be different from the original. Will check Cr/K to ensure CKD is stable.  Edema is stable, encouraged usi  . Elevated hemoglobin A1c 06/11/2019  . Essential hypertension 06/09/2014   Overview:  metoprolol caused bradycardia amlodipine causes headache, leg swelling  Last Assessment & Plan:  We had a long discussion regarding goals of treatment of HTN to prevent worsening cardiac and renal disease.  We also discussed options to change his regimen.  He states that he would prefer to seek a practitioner who can recommend herbal remedies as he feels thhis will be a healthier path f  . Foley catheter in place   . Hepatitis C   . History of gout   . Hyperlipidemia 06/29/2014   Last Assessment & Plan:  Was agreeable to restarting at last visit.  Unclear if he is currently taking.  Reports no side effects,  Encouraged to continue  Last Assessment & Plan:  Formatting of this note might be different from the original. Was agreeable to restarting at last visit.  Unclear if he is currently taking.  Reports no side effects,  Encouraged to continue  .  Hypertension   . Hypertensive heart disease   . Hypertensive urgency 10/07/2020  . Hypokalemia 12/09/2018  . Irregular heart rate 06/11/2019  . Laryngeal mass 06/17/2020  . Localized osteoarthritis of left knee 06/11/2020   Formatting of this note is different from the original.  Left knee injection-06/10/2020  . LVH (left ventricular hypertrophy) due to hypertensive disease    (a) echo 07/29/19 severe concentric LVH  . Mass of thyroid region 03/28/2018  . Noncompliance by  refusing intervention or support 10/22/2016  . Nonsustained ventricular tachycardia (Casey Greene) 07/23/2019  . NSVT (nonsustained ventricular tachycardia) (Casey Greene)    (a) Zio monitor 06/2019. Subsequent echo with severe concentric LVH, but normal EF 55-60%  . Onychomycosis 12/01/2013  . PAC (premature atrial contraction)    (a) ZIO 06/2019. Asymptomatic  . Pain, foot 12/01/2013  . Prostate CA (Casey Greene) 06/09/2014   Formatting of this note might be different from the original. Dx in 1997, never treated, taking herbal supplements. S/p prostatectomy 2017  Last Assessment & Plan:  Formatting of this note might be different from the original. Awaiting path report- patient has urology follow-up next week to discuss next steps.  . Prostate cancer (Casey Greene)   . PVD (peripheral vascular disease) (Casey Greene) 12/01/2013  . Refusal of medication 10/22/2016  . Screen for colon cancer 12/09/2018  . Slow heartbeat   . Stage 3b chronic kidney disease (Casey Greene) 10/13/2014   Last Assessment & Plan:  Discussed with patient importance of good BP control, avoiding nephrotoxic meds/supplements in preventing further progression  . Urinary retention     Past Surgical History:  Procedure Laterality Date  . ACHILLES TENDON REPAIR    . CERVICAL DISC SURGERY     neck  . COLONOSCOPY     before 2010   . LYMPHADENECTOMY Bilateral 01/26/2016   Procedure: EXTENDED BILATERAL PELVIC LYMPHADENECTOMY;  Surgeon: Raynelle Bring, MD;  Location: [facility];  Service: Urology;  Laterality: Bilateral;  . NOSE SURGERY     in high school-nasal fracturex2  . ROBOT ASSISTED LAPAROSCOPIC RADICAL PROSTATECTOMY N/A 01/26/2016   Procedure: XI ROBOTIC ASSISTED LAPAROSCOPIC RADICAL PROSTATECTOMY LEVEL 3;  Surgeon: Raynelle Bring, MD;  Location: [facility];  Service: Urology;  Laterality: N/A;    Current Medications: Current Meds  Medication Sig  . calcitRIOL (ROCALTROL) 0.25 MCG capsule TAKE 1 CAPSULE EVERY DAY  . Calcium Carbonate-Vit D-Min (CALTRATE 600+D PLUS PO) Take 1 tablet  by mouth daily.   . carvedilol (COREG) 6.25 MG tablet TAKE 1 TABLET TWICE DAILY WITH MEALS  . Ferrous Fumarate (HEMOCYTE - 106 MG FE) 324 (106 Fe) MG TABS tablet Take 1 tablet by mouth.  . hydrALAZINE (APRESOLINE) 50 MG tablet Take 1 tablet (50 mg total) by mouth every 8 (eight) hours.  Marland Kitchen losartan (COZAAR) 50 MG tablet Take 1 tablet (50 mg total) by mouth 2 (two) times daily.  Marland Kitchen oxybutynin (DITROPAN) 5 MG tablet Take 5 mg by mouth at bedtime.     Allergies:   Amlodipine and Tamsulosin hcl   Social History   Socioeconomic History  . Marital status: Married    Spouse name: Not on file  . Number of children: Not on file  . Years of education: Not on file  . Highest education level: Not on file  Occupational History  . Not on file  Tobacco Use  . Smoking status: Former Smoker    Packs/day: 0.50    Years: 10.00    Pack years: 5.00    Types: Cigarettes    Quit  date: 01/11/2006    Years since quitting: 14.7  . Smokeless tobacco: Never Used  Vaping Use  . Vaping Use: Never used  Substance and Sexual Activity  . Alcohol use: No  . Drug use: Not Currently  . Sexual activity: Not Currently  Other Topics Concern  . Not on file  Social History Narrative  . Not on file   Social Determinants of Health   Financial Resource Strain:   . Difficulty of Paying Living Expenses: Not on file  Food Insecurity:   . Worried About Charity fundraiser in the Last Year: Not on file  . Ran Out of Food in the Last Year: Not on file  Transportation Needs:   . Lack of Transportation (Medical): Not on file  . Lack of Transportation (Non-Medical): Not on file  Physical Activity:   . Days of Exercise per Week: Not on file  . Minutes of Exercise per Session: Not on file  Stress:   . Feeling of Stress : Not on file  Social Connections:   . Frequency of Communication with Friends and Family: Not on file  . Frequency of Social Gatherings with Friends and Family: Not on file  . Attends Religious  Services: Not on file  . Active Member of Clubs or Organizations: Not on file  . Attends Archivist Meetings: Not on file  . Marital Status: Not on file     Family History: The patient's family history includes ALS in his sister; Cervical cancer in his mother; Diabetes in his father; Heart disease in his father and mother; Obesity in his mother. There is no history of Colon cancer, Esophageal cancer, Colon polyps, Rectal cancer, or Stomach cancer. ROS:   Please see the history of present illness.    All 14 point review of systems negative except as described per history of present illness  EKGs/Labs/Other Studies Reviewed:      Recent Labs: 10/27/2019: ALT 6 10/07/2020: Hemoglobin 12.5; Platelets 170 10/09/2020: BUN 39; Creatinine, Ser 2.37; Potassium 3.6; Sodium 138  Recent Lipid Panel    Component Value Date/Time   CHOL 190 03/28/2018 1206   TRIG 61.0 03/28/2018 1206   HDL 61.90 03/28/2018 1206   CHOLHDL 3 03/28/2018 1206   VLDL 12.2 03/28/2018 1206   LDLCALC 116 (H) 03/28/2018 1206    Physical Exam:    VS:  BP (!) 142/100 (BP Location: Right Arm, Patient Position: Sitting, Cuff Size: Normal)   Pulse 66   Ht 5\' 10"  (1.778 m)   Wt 184 lb (83.5 kg)   SpO2 98%   BMI 26.40 kg/m     Wt Readings from Last 3 Encounters:  10/18/20 184 lb (83.5 kg)  10/12/20 182 lb 6.4 oz (82.7 kg)  10/09/20 176 lb 2.4 oz (79.9 kg)     GEN:  Well nourished, well developed in no acute distress HEENT: Normal NECK: No JVD; No carotid bruits LYMPHATICS: No lymphadenopathy CARDIAC: RRR, no murmurs, no rubs, no gallops RESPIRATORY:  Clear to auscultation without rales, wheezing or rhonchi  ABDOMEN: Soft, non-tender, non-distended MUSCULOSKELETAL:  No edema; No deformity  SKIN: Warm and dry LOWER EXTREMITIES: no swelling NEUROLOGIC:  Alert and oriented x 3 PSYCHIATRIC:  Normal affect   ASSESSMENT:    1. NSVT (nonsustained ventricular tachycardia) ([place])   2. Hypertensive left  ventricular hypertrophy, without heart failure   3. Mixed hyperlipidemia   4. Ascending aorta dilation ([facility])    PLAN:    In order of problems listed  above:  1. Nonsustained ventricular tachycardia no dizziness no passing out.  Echocardiogram preserved ejection fraction stress test no evidence of ischemia.  Continue conservative approach which include beta-blocker. 2. Essential hypertension his blood pressure slightly on the higher side today however asking to check blood pressure on the regular basis for next 2 to 3 weeks and bring results to me.  We will continue present medications in the meantime. 3. Mixed dyslipidemia: I do not have any rate latest fasting lipid profile 1 I have is from 2019. 4. Ascending aortic enlargement measuring 42 mm.  We will continue monitoring and take care of his blood pressure. 5. I did review record from the hospital for this visit.   Medication Adjustments/Labs and Tests Ordered: Current medicines are reviewed at length with the patient today.  Concerns regarding medicines are outlined above.  No orders of the defined types were placed in this encounter.  Medication changes: No orders of the defined types were placed in this encounter.   Signed, Park Liter, MD, [facility] 10/18/2020 10:53 AM    [place]

## 2020-10-18 NOTE — Patient Instructions (Signed)

## 2020-10-19 ENCOUNTER — Other Ambulatory Visit: Payer: Self-pay

## 2020-10-19 DIAGNOSIS — M1030 Gout due to renal impairment, unspecified site: Secondary | ICD-10-CM | POA: Diagnosis not present

## 2020-10-19 DIAGNOSIS — N184 Chronic kidney disease, stage 4 (severe): Secondary | ICD-10-CM | POA: Diagnosis not present

## 2020-10-19 DIAGNOSIS — I491 Atrial premature depolarization: Secondary | ICD-10-CM | POA: Diagnosis not present

## 2020-10-19 DIAGNOSIS — I472 Ventricular tachycardia: Secondary | ICD-10-CM | POA: Diagnosis not present

## 2020-10-19 DIAGNOSIS — I77819 Aortic ectasia, unspecified site: Secondary | ICD-10-CM | POA: Diagnosis not present

## 2020-10-19 DIAGNOSIS — I1310 Hypertensive heart and chronic kidney disease without heart failure, with stage 1 through stage 4 chronic kidney disease, or unspecified chronic kidney disease: Secondary | ICD-10-CM | POA: Diagnosis not present

## 2020-10-19 DIAGNOSIS — I951 Orthostatic hypotension: Secondary | ICD-10-CM | POA: Diagnosis not present

## 2020-10-19 DIAGNOSIS — M160 Bilateral primary osteoarthritis of hip: Secondary | ICD-10-CM | POA: Diagnosis not present

## 2020-10-19 DIAGNOSIS — M170 Bilateral primary osteoarthritis of knee: Secondary | ICD-10-CM | POA: Diagnosis not present

## 2020-10-19 NOTE — Telephone Encounter (Signed)
Rx filled at other pharmacy on file, 10/09/20  #90/0 I spoke to pt's wife and she stated that he did have it.

## 2020-10-24 DIAGNOSIS — I951 Orthostatic hypotension: Secondary | ICD-10-CM | POA: Diagnosis not present

## 2020-10-24 DIAGNOSIS — I77819 Aortic ectasia, unspecified site: Secondary | ICD-10-CM | POA: Diagnosis not present

## 2020-10-24 DIAGNOSIS — M170 Bilateral primary osteoarthritis of knee: Secondary | ICD-10-CM | POA: Diagnosis not present

## 2020-10-24 DIAGNOSIS — M160 Bilateral primary osteoarthritis of hip: Secondary | ICD-10-CM | POA: Diagnosis not present

## 2020-10-24 DIAGNOSIS — M1030 Gout due to renal impairment, unspecified site: Secondary | ICD-10-CM | POA: Diagnosis not present

## 2020-10-24 DIAGNOSIS — I1310 Hypertensive heart and chronic kidney disease without heart failure, with stage 1 through stage 4 chronic kidney disease, or unspecified chronic kidney disease: Secondary | ICD-10-CM | POA: Diagnosis not present

## 2020-10-24 DIAGNOSIS — N184 Chronic kidney disease, stage 4 (severe): Secondary | ICD-10-CM | POA: Diagnosis not present

## 2020-10-24 DIAGNOSIS — I491 Atrial premature depolarization: Secondary | ICD-10-CM | POA: Diagnosis not present

## 2020-10-24 DIAGNOSIS — I472 Ventricular tachycardia: Secondary | ICD-10-CM | POA: Diagnosis not present

## 2020-10-25 ENCOUNTER — Other Ambulatory Visit: Payer: Self-pay

## 2020-10-25 ENCOUNTER — Ambulatory Visit (HOSPITAL_COMMUNITY)
Admission: RE | Admit: 2020-10-25 | Discharge: 2020-10-25 | Disposition: A | Discharge status: Home / Self Care | Payer: Medicare HMO | Source: Ambulatory Visit | Attending: Urology | Admitting: Urology

## 2020-10-25 DIAGNOSIS — K439 Ventral hernia without obstruction or gangrene: Secondary | ICD-10-CM | POA: Diagnosis not present

## 2020-10-25 DIAGNOSIS — K429 Umbilical hernia without obstruction or gangrene: Secondary | ICD-10-CM | POA: Diagnosis not present

## 2020-10-25 DIAGNOSIS — C61 Malignant neoplasm of prostate: Secondary | ICD-10-CM | POA: Insufficient documentation

## 2020-10-25 DIAGNOSIS — K469 Unspecified abdominal hernia without obstruction or gangrene: Secondary | ICD-10-CM | POA: Diagnosis not present

## 2020-10-25 MED ORDER — PIFLIFOLASTAT F 18 (PYLARIFY) INJECTION
9.0000 | Freq: Once | INTRAVENOUS | Status: AC
  Administered 2020-10-25: 8.65 via INTRAVENOUS

## 2020-10-26 DIAGNOSIS — M160 Bilateral primary osteoarthritis of hip: Secondary | ICD-10-CM | POA: Diagnosis not present

## 2020-10-26 DIAGNOSIS — I491 Atrial premature depolarization: Secondary | ICD-10-CM | POA: Diagnosis not present

## 2020-10-26 DIAGNOSIS — I1310 Hypertensive heart and chronic kidney disease without heart failure, with stage 1 through stage 4 chronic kidney disease, or unspecified chronic kidney disease: Secondary | ICD-10-CM | POA: Diagnosis not present

## 2020-10-26 DIAGNOSIS — I472 Ventricular tachycardia: Secondary | ICD-10-CM | POA: Diagnosis not present

## 2020-10-26 DIAGNOSIS — M170 Bilateral primary osteoarthritis of knee: Secondary | ICD-10-CM | POA: Diagnosis not present

## 2020-10-26 DIAGNOSIS — I77819 Aortic ectasia, unspecified site: Secondary | ICD-10-CM | POA: Diagnosis not present

## 2020-10-26 DIAGNOSIS — I951 Orthostatic hypotension: Secondary | ICD-10-CM | POA: Diagnosis not present

## 2020-10-26 DIAGNOSIS — N184 Chronic kidney disease, stage 4 (severe): Secondary | ICD-10-CM | POA: Diagnosis not present

## 2020-10-26 DIAGNOSIS — M1030 Gout due to renal impairment, unspecified site: Secondary | ICD-10-CM | POA: Diagnosis not present

## 2020-10-28 DIAGNOSIS — C61 Malignant neoplasm of prostate: Secondary | ICD-10-CM | POA: Diagnosis not present

## 2020-10-28 DIAGNOSIS — B1920 Unspecified viral hepatitis C without hepatic coma: Secondary | ICD-10-CM | POA: Diagnosis not present

## 2020-10-28 DIAGNOSIS — N189 Chronic kidney disease, unspecified: Secondary | ICD-10-CM | POA: Diagnosis not present

## 2020-10-28 DIAGNOSIS — N184 Chronic kidney disease, stage 4 (severe): Secondary | ICD-10-CM | POA: Diagnosis not present

## 2020-10-28 DIAGNOSIS — N2581 Secondary hyperparathyroidism of renal origin: Secondary | ICD-10-CM | POA: Diagnosis not present

## 2020-10-28 DIAGNOSIS — I129 Hypertensive chronic kidney disease with stage 1 through stage 4 chronic kidney disease, or unspecified chronic kidney disease: Secondary | ICD-10-CM | POA: Diagnosis not present

## 2020-10-28 DIAGNOSIS — D631 Anemia in chronic kidney disease: Secondary | ICD-10-CM | POA: Diagnosis not present

## 2020-10-30 ENCOUNTER — Encounter: Payer: Self-pay | Admitting: Family Medicine

## 2020-10-31 DIAGNOSIS — I472 Ventricular tachycardia: Secondary | ICD-10-CM | POA: Diagnosis not present

## 2020-10-31 DIAGNOSIS — M160 Bilateral primary osteoarthritis of hip: Secondary | ICD-10-CM | POA: Diagnosis not present

## 2020-10-31 DIAGNOSIS — M170 Bilateral primary osteoarthritis of knee: Secondary | ICD-10-CM | POA: Diagnosis not present

## 2020-10-31 DIAGNOSIS — N184 Chronic kidney disease, stage 4 (severe): Secondary | ICD-10-CM | POA: Diagnosis not present

## 2020-10-31 DIAGNOSIS — M1030 Gout due to renal impairment, unspecified site: Secondary | ICD-10-CM | POA: Diagnosis not present

## 2020-10-31 DIAGNOSIS — I1310 Hypertensive heart and chronic kidney disease without heart failure, with stage 1 through stage 4 chronic kidney disease, or unspecified chronic kidney disease: Secondary | ICD-10-CM | POA: Diagnosis not present

## 2020-10-31 DIAGNOSIS — I491 Atrial premature depolarization: Secondary | ICD-10-CM | POA: Diagnosis not present

## 2020-10-31 DIAGNOSIS — I77819 Aortic ectasia, unspecified site: Secondary | ICD-10-CM | POA: Diagnosis not present

## 2020-10-31 DIAGNOSIS — I951 Orthostatic hypotension: Secondary | ICD-10-CM | POA: Diagnosis not present

## 2020-11-01 ENCOUNTER — Encounter: Payer: Self-pay | Admitting: Family Medicine

## 2020-11-01 DIAGNOSIS — R413 Other amnesia: Secondary | ICD-10-CM

## 2020-11-01 NOTE — Telephone Encounter (Signed)
Please see message and advise.  Thank you. ° °

## 2020-11-02 ENCOUNTER — Telehealth: Payer: Self-pay | Admitting: Family Medicine

## 2020-11-02 NOTE — Progress Notes (Signed)
  Chronic Care Management   Note  11/02/2020 Name: Casey Greene MRN: [id number] DOB: Sep 14, 1951  Casey Greene is a 69 y.o. year old male who is a primary care patient of Libby Maw, MD. I reached out to Casey Greene by phone today in response to a referral sent by Mr. Mcadoo Ortman's PCP, Libby Maw, MD.   Mr. Feigel was given information about Chronic Care Management services today including:  1. CCM service includes personalized support from designated clinical staff supervised by his physician, including individualized plan of care and coordination with other care providers 2. 24/7 contact phone numbers for assistance for urgent and routine care needs. 3. Service will only be billed when office clinical staff spend 20 minutes or more in a month to coordinate care. 4. Only one practitioner may furnish and bill the service in a calendar month. 5. The patient may stop CCM services at any time (effective at the end of the month) by phone call to the office staff.   Patient agreed to services and verbal consent obtained.   Follow up plan:   Carley Perdue [employer] Scheduler

## 2020-11-03 MED ORDER — HYDRALAZINE HCL 50 MG PO TABS
50.0000 mg | ORAL_TABLET | Freq: Three times a day (TID) | ORAL | 1 refills | Status: DC
Start: 2020-11-03 — End: 2020-11-17

## 2020-11-04 ENCOUNTER — Other Ambulatory Visit (HOSPITAL_COMMUNITY): Payer: Self-pay | Admitting: Nephrology

## 2020-11-04 ENCOUNTER — Other Ambulatory Visit: Payer: Self-pay | Admitting: Nephrology

## 2020-11-07 DIAGNOSIS — M160 Bilateral primary osteoarthritis of hip: Secondary | ICD-10-CM | POA: Diagnosis not present

## 2020-11-07 DIAGNOSIS — M170 Bilateral primary osteoarthritis of knee: Secondary | ICD-10-CM | POA: Diagnosis not present

## 2020-11-07 DIAGNOSIS — I1310 Hypertensive heart and chronic kidney disease without heart failure, with stage 1 through stage 4 chronic kidney disease, or unspecified chronic kidney disease: Secondary | ICD-10-CM | POA: Diagnosis not present

## 2020-11-07 DIAGNOSIS — I472 Ventricular tachycardia: Secondary | ICD-10-CM | POA: Diagnosis not present

## 2020-11-07 DIAGNOSIS — M1030 Gout due to renal impairment, unspecified site: Secondary | ICD-10-CM | POA: Diagnosis not present

## 2020-11-07 DIAGNOSIS — I951 Orthostatic hypotension: Secondary | ICD-10-CM | POA: Diagnosis not present

## 2020-11-07 DIAGNOSIS — N184 Chronic kidney disease, stage 4 (severe): Secondary | ICD-10-CM | POA: Diagnosis not present

## 2020-11-07 DIAGNOSIS — I77819 Aortic ectasia, unspecified site: Secondary | ICD-10-CM | POA: Diagnosis not present

## 2020-11-07 DIAGNOSIS — I491 Atrial premature depolarization: Secondary | ICD-10-CM | POA: Diagnosis not present

## 2020-11-08 ENCOUNTER — Telehealth: Payer: Medicare HMO

## 2020-11-10 ENCOUNTER — Telehealth: Payer: Medicare HMO

## 2020-11-10 DIAGNOSIS — I491 Atrial premature depolarization: Secondary | ICD-10-CM | POA: Diagnosis not present

## 2020-11-10 DIAGNOSIS — I472 Ventricular tachycardia: Secondary | ICD-10-CM | POA: Diagnosis not present

## 2020-11-10 DIAGNOSIS — M160 Bilateral primary osteoarthritis of hip: Secondary | ICD-10-CM | POA: Diagnosis not present

## 2020-11-10 DIAGNOSIS — M170 Bilateral primary osteoarthritis of knee: Secondary | ICD-10-CM | POA: Diagnosis not present

## 2020-11-10 DIAGNOSIS — I951 Orthostatic hypotension: Secondary | ICD-10-CM | POA: Diagnosis not present

## 2020-11-10 DIAGNOSIS — M1030 Gout due to renal impairment, unspecified site: Secondary | ICD-10-CM | POA: Diagnosis not present

## 2020-11-10 DIAGNOSIS — N184 Chronic kidney disease, stage 4 (severe): Secondary | ICD-10-CM | POA: Diagnosis not present

## 2020-11-10 DIAGNOSIS — I1310 Hypertensive heart and chronic kidney disease without heart failure, with stage 1 through stage 4 chronic kidney disease, or unspecified chronic kidney disease: Secondary | ICD-10-CM | POA: Diagnosis not present

## 2020-11-10 DIAGNOSIS — I77819 Aortic ectasia, unspecified site: Secondary | ICD-10-CM | POA: Diagnosis not present

## 2020-11-11 ENCOUNTER — Encounter: Payer: Self-pay | Admitting: Family Medicine

## 2020-11-14 DIAGNOSIS — N184 Chronic kidney disease, stage 4 (severe): Secondary | ICD-10-CM | POA: Diagnosis not present

## 2020-11-14 DIAGNOSIS — I1310 Hypertensive heart and chronic kidney disease without heart failure, with stage 1 through stage 4 chronic kidney disease, or unspecified chronic kidney disease: Secondary | ICD-10-CM | POA: Diagnosis not present

## 2020-11-14 DIAGNOSIS — I491 Atrial premature depolarization: Secondary | ICD-10-CM | POA: Diagnosis not present

## 2020-11-14 DIAGNOSIS — I77819 Aortic ectasia, unspecified site: Secondary | ICD-10-CM | POA: Diagnosis not present

## 2020-11-14 DIAGNOSIS — I472 Ventricular tachycardia: Secondary | ICD-10-CM | POA: Diagnosis not present

## 2020-11-14 DIAGNOSIS — M170 Bilateral primary osteoarthritis of knee: Secondary | ICD-10-CM | POA: Diagnosis not present

## 2020-11-14 DIAGNOSIS — M160 Bilateral primary osteoarthritis of hip: Secondary | ICD-10-CM | POA: Diagnosis not present

## 2020-11-14 DIAGNOSIS — M1030 Gout due to renal impairment, unspecified site: Secondary | ICD-10-CM | POA: Diagnosis not present

## 2020-11-14 DIAGNOSIS — I951 Orthostatic hypotension: Secondary | ICD-10-CM | POA: Diagnosis not present

## 2020-11-15 ENCOUNTER — Encounter: Payer: Self-pay | Admitting: Psychology

## 2020-11-17 ENCOUNTER — Telehealth: Payer: Self-pay

## 2020-11-17 MED ORDER — HYDRALAZINE HCL 50 MG PO TABS
50.0000 mg | ORAL_TABLET | Freq: Three times a day (TID) | ORAL | 1 refills | Status: DC
Start: 2020-11-17 — End: 2020-12-27

## 2020-11-17 NOTE — Telephone Encounter (Signed)
What pharmacy am I supposed to send it to?

## 2020-11-17 NOTE — Telephone Encounter (Signed)
Pt's wife calling to ask for refill for Hydralazine for pt.  I informed wife that medication was sent out on 11/03/20.  Wife said that is was sent to the wrong pharmacy.  Please advise.

## 2020-11-17 NOTE — Telephone Encounter (Signed)
[facility] pharmacy.  Sorry about that.  I had changed it in his chart.

## 2020-11-22 ENCOUNTER — Other Ambulatory Visit: Payer: Self-pay

## 2020-11-22 ENCOUNTER — Ambulatory Visit (HOSPITAL_COMMUNITY)
Admission: RE | Admit: 2020-11-22 | Discharge: 2020-11-22 | Disposition: A | Discharge status: Home / Self Care | Payer: Medicare HMO | Source: Ambulatory Visit | Attending: Nephrology | Admitting: Nephrology

## 2020-11-22 ENCOUNTER — Telehealth: Payer: Self-pay

## 2020-11-22 DIAGNOSIS — E041 Nontoxic single thyroid nodule: Secondary | ICD-10-CM | POA: Diagnosis not present

## 2020-11-22 DIAGNOSIS — Z8546 Personal history of malignant neoplasm of prostate: Secondary | ICD-10-CM | POA: Diagnosis not present

## 2020-11-22 MED ORDER — TECHNETIUM TC 99M SESTAMIBI - CARDIOLITE
25.8000 | Freq: Once | INTRAVENOUS | Status: AC | PRN
  Administered 2020-11-22: 11:00:00 25.8 via INTRAVENOUS

## 2020-11-22 NOTE — Progress Notes (Signed)
  Left Voice message to do initial question prior to patient appointment on12/29/2021  for CCM at 8:15 am  with Junius Argyle the Clinical pharmacist.    [place] Pharmacist Assistant [phone]

## 2020-11-24 ENCOUNTER — Telehealth: Payer: Medicare HMO

## 2020-11-24 DIAGNOSIS — I951 Orthostatic hypotension: Secondary | ICD-10-CM | POA: Diagnosis not present

## 2020-11-24 DIAGNOSIS — I1310 Hypertensive heart and chronic kidney disease without heart failure, with stage 1 through stage 4 chronic kidney disease, or unspecified chronic kidney disease: Secondary | ICD-10-CM | POA: Diagnosis not present

## 2020-11-24 DIAGNOSIS — M1030 Gout due to renal impairment, unspecified site: Secondary | ICD-10-CM | POA: Diagnosis not present

## 2020-11-24 DIAGNOSIS — I472 Ventricular tachycardia: Secondary | ICD-10-CM | POA: Diagnosis not present

## 2020-11-24 DIAGNOSIS — I77819 Aortic ectasia, unspecified site: Secondary | ICD-10-CM | POA: Diagnosis not present

## 2020-11-24 DIAGNOSIS — I491 Atrial premature depolarization: Secondary | ICD-10-CM | POA: Diagnosis not present

## 2020-11-24 DIAGNOSIS — M160 Bilateral primary osteoarthritis of hip: Secondary | ICD-10-CM | POA: Diagnosis not present

## 2020-11-24 DIAGNOSIS — N184 Chronic kidney disease, stage 4 (severe): Secondary | ICD-10-CM | POA: Diagnosis not present

## 2020-11-24 DIAGNOSIS — M170 Bilateral primary osteoarthritis of knee: Secondary | ICD-10-CM | POA: Diagnosis not present

## 2020-11-28 ENCOUNTER — Ambulatory Visit: Payer: Medicare HMO

## 2020-11-28 DIAGNOSIS — E782 Mixed hyperlipidemia: Secondary | ICD-10-CM

## 2020-11-28 DIAGNOSIS — I10 Essential (primary) hypertension: Secondary | ICD-10-CM

## 2020-11-28 NOTE — Chronic Care Management (AMB) (Signed)
Chronic Care Management Pharmacy  Name: Casey Greene  MRN: [id number] DOB: 1951-04-13   Chief Complaint/ HPI  Casey Greene,  70 y.o. , male presents for his Initial CCM visit with the clinical pharmacist via telephone.  PCP : Libby Maw, MD Patient Care Team: Libby Maw, MD as PCP - General (Family Medicine) Park Liter, MD as PCP - Cardiology (Cardiology) Germaine Pomfret, [facility] as Pharmacist (Pharmacist) Festus Aloe, MD as Consulting Physician (Urology) Rosita Fire, MD as Consulting Physician (Nephrology)  Patient's chronic conditions include: Hypertension, Hyperlipidemia, Chronic Kidney Disease, Depression and Peripheral Vascular Disease, Chronic Hepatits C   Office Visits: 10/12/20: Patient presented to Dr. Bryan Lemma for hospital follow-up. Blood pressure much improved since discharge.  06/17/20: Patient presented to Dr. Ethelene Hal for follow-up.   Consult Visit: 10/18/20: Patient presented to Dr. Agustin Cree (Cardiology) for follow-up. Patient skipping doses of his medications.  10/14/20: Patient presented to Dr. Smitty Cords (Ortho) for osteoarthritis.  10/07/20: Patient hospitalized for hypertensive urgency. Blood pressure in the 200s. Hydralazine 25 mg TID started  09/30/20: Patient presented to Dr. Junious Silk (Urology) for neoplasm follow-up.    Subjective: Today I spoke with Casey Greene and his wife Casey Greene, who helps him manage his medications and checks his blood pressure. 75% of the visit was conducted speaking with Casey Greene. They were concerned because they have been diligent in taking his medications every day, but he has been noticing his blood pressures have been significantly elevated. He is otherwise in good spirits.   Objective: Allergies  Allergen Reactions  . Amlodipine Other (See Comments)    headache  . Tamsulosin Hcl     Medications: Outpatient Encounter Medications as of 11/28/2020  Medication Sig  Note  . Calcium Carbonate-Vit D-Min (CALTRATE 600+D PLUS PO) Take 1 tablet by mouth daily.    . carvedilol (COREG) 6.25 MG tablet TAKE 1 TABLET TWICE DAILY WITH MEALS   . Ferrous Fumarate (HEMOCYTE - 106 MG FE) 324 (106 Fe) MG TABS tablet Take 1 tablet by mouth.   . hydrALAZINE (APRESOLINE) 50 MG tablet Take 1 tablet (50 mg total) by mouth every 8 (eight) hours.   Marland Kitchen losartan (COZAAR) 50 MG tablet Take 1 tablet (50 mg total) by mouth 2 (two) times daily. (Patient taking differently: Take 50 mg by mouth daily.)   . oxybutynin (DITROPAN) 5 MG tablet Take 5 mg by mouth at bedtime. 11/28/2020: Prescribed by Dr. Junious Silk  . [DISCONTINUED] calcitRIOL (ROCALTROL) 0.25 MCG capsule TAKE 1 CAPSULE EVERY DAY    No facility-administered encounter medications on file as of 11/28/2020.    Wt Readings from Last 3 Encounters:  10/18/20 184 lb (83.5 kg)  10/12/20 182 lb 6.4 oz (82.7 kg)  10/09/20 176 lb 2.4 oz (79.9 kg)    Lab Results  Component Value Date   CREATININE 2.37 (H) 10/09/2020   BUN 39 (H) 10/09/2020   GFR 30.42 (L) 10/27/2019   GFRNONAA 29 (L) 10/09/2020   GFRAA 31 04/13/2020   NA 138 10/09/2020   K 3.6 10/09/2020   CALCIUM 10.1 10/09/2020   CO2 27 10/09/2020     Current Diagnosis/Assessment:  SDOH Interventions   Flowsheet Row Most Recent Value  SDOH Interventions   Financial Strain Interventions Intervention Not Indicated  Transportation Interventions Intervention Not Indicated      Goals Addressed            This Visit's Progress   . Chronic Care Management       CARE  PLAN ENTRY (see longitudinal plan of care for additional care plan information)  Current Barriers:  . Chronic Disease Management support, education, and care coordination needs related to Hypertension, Hyperlipidemia, Chronic Kidney Disease, Depression and Peripheral Vascular Disease, Chronic Hepatits C    Hypertension BP Readings from Last 3 Encounters:  10/18/20 (!) 142/100  10/12/20 140/82   10/09/20 (!) 172/97   . Pharmacist Clinical Goal(s): o Over the next 90 days, patient will work with PharmD and providers to achieve BP goal <130/80 . Current regimen:  . Carvedilol 6.25 mg twice daily  . Hydralazine 50 mg three times daily   . Losartan 50 mg daily . Interventions: o Discussed low salt diet and exercising as tolerated extensively o Will initiate blood pressure monitoring plan  . Patient self care activities - Over the next 90 days, patient will: o Check blood pressure daily, document, and provide at future appointments o Ensure daily salt intake < 2300 mg/day  Hyperlipidemia Lab Results  Component Value Date/Time   LDLCALC 116 (H) 03/28/2018 12:06 PM   . Pharmacist Clinical Goal(s): o Over the next 90 days, patient will work with PharmD and providers to achieve LDL goal < 70 . Current regimen:  o None . Interventions: o Discussed low cholesterol diet and exercising as tolerated extensively o Will initiate cholesterol monitoring plan   Medication management . Pharmacist Clinical Goal(s): o Over the next 90 days, patient will work with PharmD and providers to achieve optimal medication adherence . Current pharmacy: [facility] . Interventions o Comprehensive medication review performed. o Continue current medication management strategy . Patient self care activities - Over the next 90 days, patient will: o Take medications as prescribed o Report any questions or concerns to PharmD and/or provider(s)      Hypertension   Patient with history of V.Tach.   BP goal is:  <130/80  Office blood pressures are  BP Readings from Last 3 Encounters:  10/18/20 (!) 142/100  10/12/20 140/82  10/09/20 (!) 172/97   Patient checks BP at home daily Patient home BP readings are ranging:  12/30- 148/40 (PM)  12/29- 194/122 (AM); 186/91 (PM) 12/28- 199/101  12/27- 203/105  12/26- 190/100 (AM); 172/97 (PM) 12/24-163/109 (AM); 180/85 (PM)  Patient has  failed these meds in the past: Amlodipine, doxazosin, furosemide, lisinopril, metoprolol  Patient is currently uncontrolled on the following medications:  . Carvedilol 6.25 mg twice daily (AM,EM)  . Hydralazine 50 mg TID  . Losartan 50 mg twice daily (once daily AM)   We discussed diet and exercise extensively. Denies salting his food. Patient wants to walk more frequently.   He has been sure to take his medications as prescribed every day, but his blood pressure has been very elevated. He denies any symptoms of neurologic symptoms, chest pain, headaches, or symptoms of a stroke.   Patient is concerned that his blood pressure machine might be incorrect, but the readings when his wife checks her blood pressure are normal.   Patient states Nephrology decreased losartan to 50 mg daily and stopped calcitriol on 11/02/20.   Patient with previously listed history of headaches with amlodipine, but patient unable to provide any history to the severity of his symptoms on this medication.   Plan  Recommend restarting losartan 100 mg daily   Given CKD, amlodipine may be a reasonable option if further BP lowering required, but unsure if patient would be willing to retry amlodipine 5 mg daily.  CPA Assessment in one month to  re-assess blood pressures  Hyperlipidemia   LDL goal < 70  Last lipids Lab Results  Component Value Date   CHOL 190 03/28/2018   HDL 61.90 03/28/2018   LDLCALC 116 (H) 03/28/2018   TRIG 61.0 03/28/2018   CHOLHDL 3 03/28/2018   Hepatic Function Latest Ref Rng & Units 04/13/2020 12/23/2019 10/27/2019  Total Protein 6.0 - 8.3 g/dL - - 7.9  Albumin 3.5 - 5.0 4.1 4.3 4.1  AST 0 - 37 U/L - - 15  ALT 0 - 53 U/L - - 6  Alk Phosphatase 39 - 117 U/L - - 63  Total Bilirubin 0.2 - 1.2 mg/dL - - 0.4     The 10-year ASCVD risk score Mikey Bussing DC Jr., et al., 2013) is: 21.8%   Values used to calculate the score:     Age: 8 years     Sex: Male     Is Non-Hispanic African American:  Yes     Diabetic: No     Tobacco smoker: No     Systolic Blood Pressure: 701 mmHg     Is BP treated: Yes     HDL Cholesterol: 61.9 mg/dL     Total Cholesterol: 190 mg/dL   Patient has failed these meds in past: Atorvastatin Patient is currently uncontrolled on the following medications:  . None   We discussed:  diet and exercise extensively. Patient is a candidate for High-intensity statin given history of PVD. Will defer until next visit to discuss further.   Plan  Continue current medications  Overactive Bladder   Patient has failed these meds in past: Tamsulosin Patient is currently uncontrolled on the following medications:  . Oxybutynin 5 mg QHS   We discussed: Patient feels that oxybutynin has not made any difference. Could consider increasing, but will defer until next visit to discuss in depth with patient.   Plan  Continue current medications  Misc / OTC    . Calcium Carbonate + D 600 mg daily (AM) . Ferrous [place] 325 mg daily   We discussed:  Patient reports calcitriol stopped by nephrology.   Plan  Continue current medications  Medication Management   Patient's preferred pharmacy is:  [id number] [address], [address] AT [facility] [address] [place] [address] Phone: [phone] Fax: [phone]  [facility] [place], [address] [place] [address] Phone: [phone] Fax: [phone]  [place], [address], [address] [place], [address] [place] [address] Phone: [phone] Fax: [phone]  Uses pill box? Yes, wife fills for him every week Pt endorses 100% compliance since his hospital discharge  We discussed: Discussed benefits of medication synchronization, packaging and delivery as well as enhanced pharmacist oversight with [employer].  Plan  Will investigate medication  price for patient and follow-up.   Follow up: 2 month phone visit  [place] Pharmacist [facility] at [facility]  [phone]

## 2020-11-28 NOTE — Patient Instructions (Signed)
Visit Information It was great speaking with you today!  Please let me know if you have any questions about our visit.  Goals Addressed            This Visit's Progress   . Chronic Care Management       CARE PLAN ENTRY (see longitudinal plan of care for additional care plan information)  Current Barriers:  . Chronic Disease Management support, education, and care coordination needs related to Hypertension, Hyperlipidemia, Chronic Kidney Disease, Depression and Peripheral Vascular Disease, Chronic Hepatits C    Hypertension BP Readings from Last 3 Encounters:  10/18/20 (!) 142/100  10/12/20 140/82  10/09/20 (!) 172/97   . Pharmacist Clinical Goal(s): o Over the next 90 days, patient will work with PharmD and providers to achieve BP goal <130/80 . Current regimen:  . Carvedilol 6.25 mg twice daily  . Hydralazine 50 mg three times daily   . Losartan 50 mg daily . Interventions: o Discussed low salt diet and exercising as tolerated extensively o Will initiate blood pressure monitoring plan  . Patient self care activities - Over the next 90 days, patient will: o Check blood pressure daily, document, and provide at future appointments o Ensure daily salt intake < 2300 mg/day  Hyperlipidemia Lab Results  Component Value Date/Time   LDLCALC 116 (H) 03/28/2018 12:06 PM   . Pharmacist Clinical Goal(s): o Over the next 90 days, patient will work with PharmD and providers to achieve LDL goal < 70 . Current regimen:  o None . Interventions: o Discussed low cholesterol diet and exercising as tolerated extensively o Will initiate cholesterol monitoring plan   Medication management . Pharmacist Clinical Goal(s): o Over the next 90 days, patient will work with PharmD and providers to achieve optimal medication adherence . Current pharmacy: [facility] . Interventions o Comprehensive medication review performed. o Continue current medication management strategy . Patient  self care activities - Over the next 90 days, patient will: o Take medications as prescribed o Report any questions or concerns to PharmD and/or provider(s)       Casey Greene was given information about Chronic Care Management services today including:  1. CCM service includes personalized support from designated clinical staff supervised by his physician, including individualized plan of care and coordination with other care providers 2. 24/7 contact phone numbers for assistance for urgent and routine care needs. 3. Standard insurance, coinsurance, copays and deductibles apply for chronic care management only during months in which we provide at least 20 minutes of these services. Most insurances cover these services at 100%, however patients may be responsible for any copay, coinsurance and/or deductible if applicable. This service may help you avoid the need for more expensive face-to-face services. 4. Only one practitioner may furnish and bill the service in a calendar month. 5. The patient may stop CCM services at any time (effective at the end of the month) by phone call to the office staff.  Patient agreed to services and verbal consent obtained.   The patient verbalized understanding of instructions, educational materials, and care plan provided today and agreed to receive a mailed copy of patient instructions, educational materials, and care plan.  Telephone follow up appointment with pharmacy team member scheduled for: 01/24/21 at 10:00 AM  [place] Pharmacist [facility]  [phone]

## 2020-12-05 ENCOUNTER — Encounter: Payer: Self-pay | Admitting: Infectious Diseases

## 2020-12-05 ENCOUNTER — Ambulatory Visit (INDEPENDENT_AMBULATORY_CARE_PROVIDER_SITE_OTHER): Payer: Medicare Other | Admitting: Infectious Diseases

## 2020-12-05 ENCOUNTER — Telehealth: Payer: Self-pay

## 2020-12-05 ENCOUNTER — Other Ambulatory Visit: Payer: Self-pay

## 2020-12-05 VITALS — BP 172/102 | HR 60 | Resp 16 | Ht 70.0 in | Wt 186.6 lb

## 2020-12-05 DIAGNOSIS — I10 Essential (primary) hypertension: Secondary | ICD-10-CM | POA: Diagnosis not present

## 2020-12-05 DIAGNOSIS — Z87898 Personal history of other specified conditions: Secondary | ICD-10-CM | POA: Diagnosis not present

## 2020-12-05 DIAGNOSIS — Z8619 Personal history of other infectious and parasitic diseases: Secondary | ICD-10-CM | POA: Diagnosis not present

## 2020-12-05 DIAGNOSIS — Z09 Encounter for follow-up examination after completed treatment for conditions other than malignant neoplasm: Secondary | ICD-10-CM | POA: Diagnosis not present

## 2020-12-05 HISTORY — DX: Personal history of other specified conditions: Z87.898

## 2020-12-05 NOTE — Assessment & Plan Note (Signed)
I am not so sure he is taking his antihypertensives with reviewing the last provider visit in November and the patient's desire to use herbal remedies to manage his blood pressure.  We discussed today that he really should reconsider offering of medication given it was pretty high in clinic today.

## 2020-12-05 NOTE — Assessment & Plan Note (Addendum)
He is now 2-1/2 years out following completion of his therapy.  He does not drink alcohol.  Has not had a liver ultrasound since February 2021.  Given his pretreatment fibrosis assessment indicating advanced fibrosis borderline cirrhosis we will proceed with continuing every 6 months [place] screenings and help him set up another study next couple of weeks.  Follow-up with Dr. Baxter Flattery again in 6 months.  We spent some time discussing the indications for the screenings.  Also reviewed overall liver health. Will give first hepatitis B vaccine today and have him back in 2 months for the second shot.

## 2020-12-05 NOTE — Patient Instructions (Addendum)
Please schedule a repeat Ultrasound of your liver.  FU with Dr. Baxter Flattery again in 6 months.   [facility] - Call them for an appointment to address the urinary frequency at night.  [phone]  For a gym - consider the [facility]  [phone]  [place]  [phone] Open ? Closes 8:30PM

## 2020-12-05 NOTE — Progress Notes (Signed)
RFV: follow up for chronic hep C Patient ID: Casey Greene, male   DOB: 10-Mar-1951, 70 y.o.   MRN: [id number]  HPI Casey Greene is a 70 y.o. male with history of chronic hepatitis C s/p cure following [place] x 12 weeks in 2019 for Genotype 2, F3/4 pre-treatment fibrosis assessment. He has been following up with Dr. Baxter Flattery every 6 moths since cure for [facility] surveillance and is here today for the same. SVR12 and beyond with Hep C RNA < 15 last July 2021.   Main complaint today is frequent bladder emptying at night that has gotten worse since his prostate surgery. Follows with [facility] and does not have an appointment soon. No urinary symptoms associated with frequent emptying, has not changed in quality. Pelvic floor PT has not helped symptoms much.  Has a hernia in the abdomen that does not bother him much. Just "there".   Would like to start exercising but not sure where to go.  Worried about BP today.  No symptoms or concerns for hypertensive crisis and denies headaches or vision changes.   Outpatient Encounter Medications as of 12/05/2020  Medication Sig  . carvedilol (COREG) 6.25 MG tablet TAKE 1 TABLET TWICE DAILY WITH MEALS  . hydrALAZINE (APRESOLINE) 50 MG tablet Take 1 tablet (50 mg total) by mouth every 8 (eight) hours.  Marland Kitchen losartan (COZAAR) 50 MG tablet Take 1 tablet (50 mg total) by mouth 2 (two) times daily. (Patient taking differently: Take 50 mg by mouth daily.)  . oxybutynin (DITROPAN) 5 MG tablet Take 5 mg by mouth at bedtime.  . [DISCONTINUED] Calcium Carbonate-Vit D-Min (CALTRATE 600+D PLUS PO) Take 1 tablet by mouth daily.   . [DISCONTINUED] Ferrous Fumarate (HEMOCYTE - 106 MG FE) 324 (106 Fe) MG TABS tablet Take 1 tablet by mouth.   No facility-administered encounter medications on file as of 12/05/2020.     Patient Active Problem List   Diagnosis Date Noted  . History of urinary frequency 12/05/2020  . Slow heartbeat   . Hypertensive heart disease   .  History of gout   . Foley catheter in place   . Dysrhythmia   . Decreased appetite   . Hypertensive urgency 10/07/2020  . Laryngeal mass 06/17/2020  . DOE (dyspnea on exertion) 06/17/2020  . Localized osteoarthritis of left knee 06/11/2020  . NSVT (nonsustained ventricular tachycardia) ([place])   . Hypertension   . LVH (left ventricular hypertrophy) due to hypertensive disease   . Ascending aorta dilation (HCC) 07/29/2019  . Nonsustained ventricular tachycardia ([place]) 07/23/2019  . Elevated hemoglobin A1c 06/11/2019  . Irregular heart rate 06/11/2019  . Corn or callus 06/11/2019  . Bleeding per rectum 12/09/2018  . Hypokalemia 12/09/2018  . Screen for colon cancer 12/09/2018  . Constipation 12/01/2018  . Mass of thyroid region 03/28/2018  . Anemia of chronic disease 03/28/2018  . Noncompliance by refusing intervention or support 10/22/2016  . Refusal of medication 10/22/2016  . Edema 07/09/2016  . Depression 02/27/2016  . Stage 3b chronic kidney disease ([place]) 10/13/2014  . CKD (chronic kidney disease) stage 3, GFR 30-59 ml/min (HCC) 10/13/2014  . Hepatitis C virus infection cured after antiviral drug therapy 07/05/2014  . Hyperlipidemia 06/29/2014  . Prostate CA ([place]) 06/09/2014  . Essential hypertension 06/09/2014  . Onychomycosis 12/01/2013  . PVD (peripheral vascular disease) ([place]) 12/01/2013     Health Maintenance Due  Topic Date Due  . TETANUS/TDAP  Never done  . INFLUENZA VACCINE  Never done  Review of Systems Review of Systems  Constitutional: Negative for appetite change, chills, fatigue, fever and unexpected weight change.  Eyes: Negative for visual disturbance.  Respiratory: Negative for cough and shortness of breath.   Cardiovascular: Negative for chest pain and leg swelling.  Gastrointestinal: Negative for abdominal pain, diarrhea and nausea.  Endocrine: Positive for polyuria.  Genitourinary: Positive for enuresis. Negative for decreased urine volume,  difficulty urinating, dysuria and urgency.  Musculoskeletal: Negative for joint swelling.  Skin: Negative for color change and rash.  Neurological: Negative for dizziness and headaches.  Hematological: Negative for adenopathy.  Psychiatric/Behavioral: Negative for sleep disturbance. The patient is not nervous/anxious.        Physical Exam   BP (!) 172/102   Pulse 60   Resp 16   Ht 5\' 10"  (1.778 m)   Wt 186 lb 9.6 oz (84.6 kg)   SpO2 99%   BMI 26.77 kg/m    Physical Exam HENT:     Mouth/Throat:     Mouth: No oral lesions.     Dentition: Normal dentition. No dental caries.  Eyes:     General: No scleral icterus. Cardiovascular:     Rate and Rhythm: Normal rate and regular rhythm.     Heart sounds: Normal heart sounds.  Pulmonary:     Effort: Pulmonary effort is normal.     Breath sounds: Normal breath sounds.  Abdominal:     General: There is no distension.     Palpations: Abdomen is soft.     Tenderness: There is no abdominal tenderness.     Comments: Soft reducible hernia in lower epigastric area superior to umbillicus  Musculoskeletal:     Comments: Walking with cane  Lymphadenopathy:     Cervical: No cervical adenopathy.  Skin:    General: Skin is warm and dry.     Capillary Refill: Capillary refill takes less than 2 seconds.     Findings: No rash.  Neurological:     Mental Status: He is alert and oriented to person, place, and time.      Lab Results  Component Value Date   HEPBSAB NON-REACTIVE 06/20/2020   Lab Results  Component Value Date   LABRPR NON REAC 09/06/2008    CBC Lab Results  Component Value Date   WBC 6.5 10/07/2020   RBC 3.95 (L) 10/07/2020   HGB 12.5 (L) 10/07/2020   HCT 37.3 (L) 10/07/2020   PLT 170 10/07/2020   MCV 94.4 10/07/2020   MCH 31.6 10/07/2020   MCHC 33.5 10/07/2020   RDW 13.1 10/07/2020   LYMPHSABS 0.8 10/07/2020   MONOABS 0.4 10/07/2020   EOSABS 0.1 10/07/2020    BMET Lab Results  Component Value Date    NA 138 10/09/2020   K 3.6 10/09/2020   CL 105 10/09/2020   CO2 27 10/09/2020   GLUCOSE 111 (H) 10/09/2020   BUN 39 (H) 10/09/2020   CREATININE 2.37 (H) 10/09/2020   CALCIUM 10.1 10/09/2020   GFRNONAA 29 (L) 10/09/2020   GFRAA 31 04/13/2020      Assessment and Plan Problem List Items Addressed This Visit      Unprioritized   History of urinary frequency    I gave him [facility]'s contact information to schedule a follow-up appointment to review these known problems him.      Hepatitis C virus infection cured after antiviral drug therapy    He is now 2-1/2 years out following completion of his therapy.  He does not drink  alcohol.  Has not had a liver ultrasound since February 2021.  Given his pretreatment fibrosis assessment indicating advanced fibrosis borderline cirrhosis we will proceed with continuing every 6 months [place] screenings and help him set up another study next couple of weeks.  Follow-up with Dr. Baxter Flattery again in 6 months.  We spent some time discussing the indications for the screenings.  Also reviewed overall liver health. Will give first hepatitis B vaccine today and have him back in 2 months for the second shot.      Essential hypertension    I am not so sure he is taking his antihypertensives with reviewing the last provider visit in November and the patient's desire to use herbal remedies to manage his blood pressure.  We discussed today that he really should reconsider offering of medication given it was pretty high in clinic today.       Other Visit Diagnoses    Need for immunization follow-up    -  Primary   Relevant Orders   Heplisav-B (HepB-CPG) Vaccine (Completed)      Janene Madeira, MSN, NP-C [place] [facility] [email or web address] Pager: [phone] Office: [phone] [place]: [phone]

## 2020-12-05 NOTE — Assessment & Plan Note (Signed)
I gave him [facility]'s contact information to schedule a follow-up appointment to review these known problems him.

## 2020-12-05 NOTE — Progress Notes (Signed)
    Chronic Care Management Pharmacy Assistant   Name: Casey Greene  MRN: [id number] DOB: 1951-06-24  Reason for Encounter: Medication Review/medication cost analysis    PCP : Libby Maw, MD  Allergies:   Allergies  Allergen Reactions  . Amlodipine Other (See Comments)    headache  . Tamsulosin Hcl     Medications: Outpatient Encounter Medications as of 12/05/2020  Medication Sig Note  . Calcium Carbonate-Vit D-Min (CALTRATE 600+D PLUS PO) Take 1 tablet by mouth daily.    . carvedilol (COREG) 6.25 MG tablet TAKE 1 TABLET TWICE DAILY WITH MEALS   . Ferrous Fumarate (HEMOCYTE - 106 MG FE) 324 (106 Fe) MG TABS tablet Take 1 tablet by mouth.   . hydrALAZINE (APRESOLINE) 50 MG tablet Take 1 tablet (50 mg total) by mouth every 8 (eight) hours.   Marland Kitchen losartan (COZAAR) 50 MG tablet Take 1 tablet (50 mg total) by mouth 2 (two) times daily. (Patient taking differently: Take 50 mg by mouth daily.)   . oxybutynin (DITROPAN) 5 MG tablet Take 5 mg by mouth at bedtime. 11/28/2020: Prescribed by Dr. Junious Silk   No facility-administered encounter medications on file as of 12/05/2020.    Current Diagnosis: Patient Active Problem List   Diagnosis Date Noted  . Urinary retention   . Slow heartbeat   . Hypertensive heart disease   . History of gout   . Hepatitis C   . Foley catheter in place   . Dysrhythmia   . Decreased appetite   . Hypertensive urgency 10/07/2020  . Laryngeal mass 06/17/2020  . DOE (dyspnea on exertion) 06/17/2020  . Localized osteoarthritis of left knee 06/11/2020  . NSVT (nonsustained ventricular tachycardia) ([place])   . Hypertension   . LVH (left ventricular hypertrophy) due to hypertensive disease   . Ascending aorta dilation (HCC) 07/29/2019  . Nonsustained ventricular tachycardia ([place]) 07/23/2019  . Elevated hemoglobin A1c 06/11/2019  . Irregular heart rate 06/11/2019  . Corn or callus 06/11/2019  . Bleeding per rectum 12/09/2018  . Hypokalemia  12/09/2018  . Screen for colon cancer 12/09/2018  . Constipation 12/01/2018  . Mass of thyroid region 03/28/2018  . Anemia of chronic disease 03/28/2018  . Noncompliance by refusing intervention or support 10/22/2016  . Refusal of medication 10/22/2016  . Edema 07/09/2016  . Depression 02/27/2016  . Stage 3b chronic kidney disease ([place]) 10/13/2014  . CKD (chronic kidney disease) stage 3, GFR 30-59 ml/min (HCC) 10/13/2014  . Chronic hepatitis C without hepatic coma ([place]) 07/05/2014  . Hyperlipidemia 06/29/2014  . Prostate CA ([place]) 06/09/2014  . Essential hypertension 06/09/2014  . Onychomycosis 12/01/2013  . PVD (peripheral vascular disease) ([place]) 12/01/2013    Goals Addressed   None    Performed cost analysis for patient, estimated yearly medication cost of $0.00 at [facility] , $51.81 at [facility] and $87.56 with [facility].I will inform patient and clinical pharmacist.   Inform patient wife, patient wife states they will stay with [facility] for now.   Follow-Up:  Medication Cost Review   Bessie [place] Pharmacist Assistant [phone]

## 2020-12-08 ENCOUNTER — Ambulatory Visit (HOSPITAL_COMMUNITY)
Admission: RE | Admit: 2020-12-08 | Discharge: 2020-12-08 | Disposition: A | Discharge status: Home / Self Care | Payer: Medicare Other | Source: Ambulatory Visit | Attending: Internal Medicine | Admitting: Internal Medicine

## 2020-12-08 ENCOUNTER — Other Ambulatory Visit: Payer: Self-pay

## 2020-12-08 DIAGNOSIS — B182 Chronic viral hepatitis C: Secondary | ICD-10-CM | POA: Diagnosis present

## 2020-12-19 ENCOUNTER — Telehealth: Payer: Self-pay

## 2020-12-19 NOTE — Telephone Encounter (Signed)
Received voicemail from patient's wife. They are able to see the patient's abdominal ultrasound results in MyChart, but would like for Dr. Baxter Flattery to explain it thoroughly and answer their questions. Attempted to call back, no answer and unable to leave voicemail. Will route to provider.   Beryle Flock, RN

## 2020-12-20 ENCOUNTER — Ambulatory Visit: Payer: Medicare HMO | Admitting: Family Medicine

## 2020-12-20 ENCOUNTER — Telehealth: Payer: Self-pay

## 2020-12-20 NOTE — Progress Notes (Signed)
    Chronic Care Management Pharmacy Assistant   Name: Casey Greene  MRN: [id number] DOB: 1951-01-09  Reason for Encounter: Medication Review  PCP : Libby Maw, MD  Allergies:   Allergies  Allergen Reactions  . Amlodipine Other (See Comments)    headache  . Tamsulosin Hcl     Medications: Outpatient Encounter Medications as of 12/20/2020  Medication Sig Note  . carvedilol (COREG) 6.25 MG tablet TAKE 1 TABLET TWICE DAILY WITH MEALS   . hydrALAZINE (APRESOLINE) 50 MG tablet Take 1 tablet (50 mg total) by mouth every 8 (eight) hours.   Marland Kitchen losartan (COZAAR) 50 MG tablet Take 1 tablet (50 mg total) by mouth 2 (two) times daily. (Patient taking differently: Take 50 mg by mouth daily.)   . oxybutynin (DITROPAN) 5 MG tablet Take 5 mg by mouth at bedtime. 11/28/2020: Prescribed by Dr. Junious Silk   No facility-administered encounter medications on file as of 12/20/2020.    Current Diagnosis: Patient Active Problem List   Diagnosis Date Noted  . History of urinary frequency 12/05/2020  . Slow heartbeat   . Hypertensive heart disease   . History of gout   . Foley catheter in place   . Dysrhythmia   . Decreased appetite   . Hypertensive urgency 10/07/2020  . Laryngeal mass 06/17/2020  . DOE (dyspnea on exertion) 06/17/2020  . Localized osteoarthritis of left knee 06/11/2020  . NSVT (nonsustained ventricular tachycardia) ([place])   . Hypertension   . LVH (left ventricular hypertrophy) due to hypertensive disease   . Ascending aorta dilation (HCC) 07/29/2019  . Nonsustained ventricular tachycardia ([place]) 07/23/2019  . Elevated hemoglobin A1c 06/11/2019  . Irregular heart rate 06/11/2019  . Corn or callus 06/11/2019  . Bleeding per rectum 12/09/2018  . Hypokalemia 12/09/2018  . Screen for colon cancer 12/09/2018  . Constipation 12/01/2018  . Mass of thyroid region 03/28/2018  . Anemia of chronic disease 03/28/2018  . Noncompliance by refusing intervention or support  10/22/2016  . Refusal of medication 10/22/2016  . Edema 07/09/2016  . Depression 02/27/2016  . Stage 3b chronic kidney disease ([place]) 10/13/2014  . CKD (chronic kidney disease) stage 3, GFR 30-59 ml/min (HCC) 10/13/2014  . Hepatitis C virus infection cured after antiviral drug therapy 07/05/2014  . Hyperlipidemia 06/29/2014  . Prostate CA ([place]) 06/09/2014  . Essential hypertension 06/09/2014  . Onychomycosis 12/01/2013  . PVD (peripheral vascular disease) ([place]) 12/01/2013    Goals Addressed   None     Follow-Up:  Medication Cost Review   Performed cost analysis for patient, estimated yearly medication cost of $0.00 with [facility].  [place] Pharmacist Assistant [phone]

## 2020-12-26 ENCOUNTER — Emergency Department (HOSPITAL_COMMUNITY): Payer: Medicare Other

## 2020-12-26 ENCOUNTER — Other Ambulatory Visit: Payer: Self-pay

## 2020-12-26 ENCOUNTER — Telehealth: Payer: Self-pay

## 2020-12-26 ENCOUNTER — Encounter (HOSPITAL_COMMUNITY): Payer: Self-pay

## 2020-12-26 ENCOUNTER — Emergency Department (HOSPITAL_COMMUNITY)
Admission: EM | Admit: 2020-12-26 | Discharge: 2020-12-26 | Disposition: A | Discharge status: Home / Self Care | Payer: Medicare Other | Attending: Emergency Medicine | Admitting: Emergency Medicine

## 2020-12-26 DIAGNOSIS — R531 Weakness: Secondary | ICD-10-CM | POA: Diagnosis present

## 2020-12-26 DIAGNOSIS — N184 Chronic kidney disease, stage 4 (severe): Secondary | ICD-10-CM | POA: Diagnosis not present

## 2020-12-26 DIAGNOSIS — Z8546 Personal history of malignant neoplasm of prostate: Secondary | ICD-10-CM | POA: Insufficient documentation

## 2020-12-26 DIAGNOSIS — I129 Hypertensive chronic kidney disease with stage 1 through stage 4 chronic kidney disease, or unspecified chronic kidney disease: Secondary | ICD-10-CM | POA: Insufficient documentation

## 2020-12-26 DIAGNOSIS — Z87891 Personal history of nicotine dependence: Secondary | ICD-10-CM | POA: Diagnosis not present

## 2020-12-26 DIAGNOSIS — Z85038 Personal history of other malignant neoplasm of large intestine: Secondary | ICD-10-CM | POA: Insufficient documentation

## 2020-12-26 DIAGNOSIS — Z79899 Other long term (current) drug therapy: Secondary | ICD-10-CM | POA: Diagnosis not present

## 2020-12-26 LAB — CBC WITH DIFFERENTIAL/PLATELET
Abs Immature Granulocytes: 0.02 10*3/uL (ref 0.00–0.07)
Basophils Absolute: 0 10*3/uL (ref 0.0–0.1)
Basophils Relative: 1 %
Eosinophils Absolute: 0 10*3/uL (ref 0.0–0.5)
Eosinophils Relative: 1 %
HCT: 40.6 % (ref 39.0–52.0)
Hemoglobin: 12.8 g/dL — ABNORMAL LOW (ref 13.0–17.0)
Immature Granulocytes: 0 %
Lymphocytes Relative: 7 %
Lymphs Abs: 0.5 10*3/uL — ABNORMAL LOW (ref 0.7–4.0)
MCH: 31.2 pg (ref 26.0–34.0)
MCHC: 31.5 g/dL (ref 30.0–36.0)
MCV: 99 fL (ref 80.0–100.0)
Monocytes Absolute: 0.4 10*3/uL (ref 0.1–1.0)
Monocytes Relative: 6 %
Neutro Abs: 5.7 10*3/uL (ref 1.7–7.7)
Neutrophils Relative %: 85 %
Platelets: 148 10*3/uL — ABNORMAL LOW (ref 150–400)
RBC: 4.1 MIL/uL — ABNORMAL LOW (ref 4.22–5.81)
RDW: 13.2 % (ref 11.5–15.5)
WBC: 6.7 10*3/uL (ref 4.0–10.5)
nRBC: 0 % (ref 0.0–0.2)

## 2020-12-26 LAB — URINALYSIS, ROUTINE W REFLEX MICROSCOPIC
Bilirubin Urine: NEGATIVE
Glucose, UA: NEGATIVE mg/dL
Hgb urine dipstick: NEGATIVE
Ketones, ur: NEGATIVE mg/dL
Leukocytes,Ua: NEGATIVE
Nitrite: NEGATIVE
Protein, ur: NEGATIVE mg/dL
Specific Gravity, Urine: 1.005 (ref 1.005–1.030)
pH: 7 (ref 5.0–8.0)

## 2020-12-26 LAB — CBG MONITORING, ED: Glucose-Capillary: 111 mg/dL — ABNORMAL HIGH (ref 70–99)

## 2020-12-26 LAB — COMPREHENSIVE METABOLIC PANEL
ALT: 10 U/L (ref 0–44)
AST: 18 U/L (ref 15–41)
Albumin: 4.6 g/dL (ref 3.5–5.0)
Alkaline Phosphatase: 53 U/L (ref 38–126)
Anion gap: 11 (ref 5–15)
BUN: 42 mg/dL — ABNORMAL HIGH (ref 8–23)
CO2: 25 mmol/L (ref 22–32)
Calcium: 10.3 mg/dL (ref 8.9–10.3)
Chloride: 105 mmol/L (ref 98–111)
Creatinine, Ser: 2.26 mg/dL — ABNORMAL HIGH (ref 0.61–1.24)
GFR, Estimated: 30 mL/min — ABNORMAL LOW (ref >60)
Glucose, Bld: 114 mg/dL — ABNORMAL HIGH (ref 70–99)
Potassium: 3.6 mmol/L (ref 3.5–5.1)
Sodium: 141 mmol/L (ref 135–145)
Total Bilirubin: 0.8 mg/dL (ref 0.3–1.2)
Total Protein: 8.2 g/dL — ABNORMAL HIGH (ref 6.5–8.1)

## 2020-12-26 LAB — RAPID URINE DRUG SCREEN, HOSP PERFORMED
Amphetamines: NOT DETECTED
Barbiturates: NOT DETECTED
Benzodiazepines: NOT DETECTED
Cocaine: NOT DETECTED
Opiates: NOT DETECTED
Tetrahydrocannabinol: NOT DETECTED

## 2020-12-26 MED ORDER — HYDRALAZINE HCL 25 MG PO TABS
50.0000 mg | ORAL_TABLET | Freq: Once | ORAL | Status: AC
  Administered 2020-12-26: 50 mg via ORAL
  Filled 2020-12-26: qty 2

## 2020-12-26 MED ORDER — CARVEDILOL 12.5 MG PO TABS
12.5000 mg | ORAL_TABLET | Freq: Once | ORAL | Status: AC
  Administered 2020-12-26: 12.5 mg via ORAL
  Filled 2020-12-26: qty 1

## 2020-12-26 NOTE — ED Triage Notes (Signed)
Emergency Medicine Provider Triage Evaluation Note  Casey Greene , a 70 y.o. male  was evaluated in triage.  Pt complains of not feeling well.   this started today, feel as if something is wrong. He denies any pain at this time, denies rencent head trauma, is not on anticoagulant.  Denies headaches, change in vision, paresthesia or weakness of her lower extremities.  States he has been eating and drink without difficulty, denies recent sick contacts, is been taking his medication as prescribed.  He denies drug or alcohol use.  Spoke with patient's wife who states patient has early onset dementia, states he generally has difficulty with word finding, denies change in behavior, recent falls, or having difficulty with balance.  She endorses that patient just complained that he just felt off today.  Review of Systems  Positive: No pertinent positives Negative: Denies headaches, fevers, chills, nasal congestion, sore throat, cough, chest pain, abdominal pain, nausea, vomiting, diarrhea, pedal edema.  Physical Exam  BP (!) 148/95 (BP Location: Right Arm)   Pulse 78   Temp 97.6 F (36.4 C) (Oral)   Resp 18   SpO2 100%  Gen:   Awake, no distress   HEENT:  Atraumatic  Resp:  Normal effort  Cardiac:  Normal rate  Abd:   Nondistended, nontender MSK:   Moves extremities without difficulty  Neuro:  Speech clear, cranial nerves III through XII intact, no neuro deficits on my exam  Medical Decision Making  Medically screening exam initiated at 12:54 PM.  Appropriate orders placed.  Mylen Mangan was informed that the remainder of the evaluation will be completed by another provider, this initial triage assessment does not replace that evaluation, and the importance of remaining in the ED until their evaluation is complete.  Clinical Impression  Patient will be evaluated for not feeling well.   Marcello Fennel, [name] 12/26/20 1343

## 2020-12-26 NOTE — ED Notes (Signed)
Pt provided with two bottles of water, awaiting pt ability to provide urine sample

## 2020-12-26 NOTE — ED Notes (Signed)
Pt arrived via walk in, c/o generalized feeling "not right" pt not able to articulate exactly what he is feeling, but states he was feeling confused this morning. Does not recall exact time. Aox4, not focal deficits. No facial droop. Grip strengths equal.   Attempted to call pt wife for additional information, did not answer at this time.

## 2020-12-26 NOTE — Telephone Encounter (Signed)
Pt's wife calling to inform office that pt is complaining of things not being right.  Pt has some cognitive dissonance, issues.  Pt's Bp was a little elevated this mooring, 176/88 explains wife.  I informed wife that this office doesn't have any open appointments today and gave her some numbers to other [place] offices to see if she can get pt in today.

## 2020-12-26 NOTE — ED Notes (Signed)
Pt ambulatory to restroom w/ his cane and required no assistance from staff. Gait steady. Pt given urinal to provide urine sample and a brief at his request. Pt was instructed to use call cord in restroom if he needed any assistance.

## 2020-12-26 NOTE — Discharge Instructions (Signed)
Call your primary care doctor or specialist as discussed in the next 2-3 days.   Return immediately back to the ER if:  Your symptoms worsen within the next 12-24 hours. You develop new symptoms such as new fevers, persistent vomiting, new pain, shortness of breath, or new weakness or numbness, or if you have any other concerns.  

## 2020-12-26 NOTE — ED Notes (Signed)
This nurse notified patient left the department, attempted to call both numbers of patient and spouse to request return to department for evaluation but no answer.

## 2020-12-26 NOTE — ED Provider Notes (Signed)
[facility] DEPT Provider Note   CSN: [id number] Arrival date & time: 12/26/20  1208     History Chief Complaint  Patient presents with  . Altered Mental Status    Casey Greene is a 70 y.o. male.  Patient presents to ER with his wife, chief complaint of "feeling off."  He states that this started today, feel as if something is wrong. He denies any pain at this time, denies rencent head trauma.  Denies headaches, change in vision, paresthesia or weakness of her lower extremities.  States he has been eating and drink without difficulty, denies recent sick contacts, is been taking his medication as prescribed.  He denies drug or alcohol use.  Spoke with patient's wife who states patient has early onset dementia, states he generally has difficulty with word finding, denies change in behavior, recent falls, or having difficulty with balance.  She denies any specific changes she has noticed today.        Past Medical History:  Diagnosis Date  . Anemia of chronic disease 03/28/2018  . Ascending aorta dilation ([place]) 07/29/2019   (a) Echo 07/29/19 11mm  . Bleeding per rectum 12/09/2018  . Chronic hepatitis C without hepatic coma ([place]) 07/05/2014   Overview:  Dr. Alice Reichert, [place]:  Working with digestive health, has liver biopsy scheduled  Formatting of this note might be different from the original. Dr. Alice Reichert, [place]:  Formatting of this note might be different from the original. Working with digestive health, has liver biopsy scheduled  . CKD (chronic kidney disease) stage 3, GFR 30-59 ml/min (HCC) 10/13/2014   Last Assessment & Plan:  Formatting of this note might be different from the original. Discussed with patient importance of good BP control, avoiding nephrotoxic meds/supplements in preventing further progression  . CKD (chronic kidney disease), stage IV ([place])   . Constipation 12/01/2018  .  Corn or callus 06/11/2019  . Decreased appetite   . Depression   . DOE (dyspnea on exertion) 06/17/2020  . Dysrhythmia   . Edema 07/09/2016   Last Assessment & Plan:  Will check Cr/K to ensure CKD is stable.  Edema is stable, encouraged using compression stockings.  Discussed with patient likely multifactorial with Chronic Hep C, CKD playing a role with upcoming ECHO scheduled.  Last Assessment & Plan:  Formatting of this note might be different from the original. Will check Cr/K to ensure CKD is stable.  Edema is stable, encouraged usi  . Elevated hemoglobin A1c 06/11/2019  . Essential hypertension 06/09/2014   Overview:  metoprolol caused bradycardia amlodipine causes headache, leg swelling  Last Assessment & Plan:  We had a long discussion regarding goals of treatment of HTN to prevent worsening cardiac and renal disease.  We also discussed options to change his regimen.  He states that he would prefer to seek a practitioner who can recommend herbal remedies as he feels thhis will be a healthier path f  . Foley catheter in place   . Hepatitis C   . History of gout   . Hyperlipidemia 06/29/2014   Last Assessment & Plan:  Was agreeable to restarting at last visit.  Unclear if he is currently taking.  Reports no side effects,  Encouraged to continue  Last Assessment & Plan:  Formatting of this note might be different from the original. Was agreeable to restarting at last visit.  Unclear if he is currently taking.  Reports  no side effects,  Encouraged to continue  . Hypertension   . Hypertensive heart disease   . Hypertensive urgency 10/07/2020  . Hypokalemia 12/09/2018  . Irregular heart rate 06/11/2019  . Laryngeal mass 06/17/2020  . Localized osteoarthritis of left knee 06/11/2020   Formatting of this note is different from the original.  Left knee injection-06/10/2020  . LVH (left ventricular hypertrophy) due to hypertensive disease    (a) echo 07/29/19 severe concentric LVH  . Mass of thyroid region  03/28/2018  . Noncompliance by refusing intervention or support 10/22/2016  . Nonsustained ventricular tachycardia ([place]) 07/23/2019  . NSVT (nonsustained ventricular tachycardia) ([place])    (a) Zio monitor 06/2019. Subsequent echo with severe concentric LVH, but normal EF 55-60%  . Onychomycosis 12/01/2013  . PAC (premature atrial contraction)    (a) ZIO 06/2019. Asymptomatic  . Pain, foot 12/01/2013  . Prostate CA ([place]) 06/09/2014   Formatting of this note might be different from the original. Dx in 1997, never treated, taking herbal supplements. S/p prostatectomy 2017  Last Assessment & Plan:  Formatting of this note might be different from the original. Awaiting path report- patient has urology follow-up next week to discuss next steps.  . Prostate cancer ([place])   . PVD (peripheral vascular disease) ([place]) 12/01/2013  . Refusal of medication 10/22/2016  . Screen for colon cancer 12/09/2018  . Slow heartbeat   . Stage 3b chronic kidney disease ([place]) 10/13/2014   Last Assessment & Plan:  Discussed with patient importance of good BP control, avoiding nephrotoxic meds/supplements in preventing further progression  . Urinary retention     Patient Active Problem List   Diagnosis Date Noted  . History of urinary frequency 12/05/2020  . Slow heartbeat   . Hypertensive heart disease   . History of gout   . Foley catheter in place   . Dysrhythmia   . Decreased appetite   . Hypertensive urgency 10/07/2020  . Laryngeal mass 06/17/2020  . DOE (dyspnea on exertion) 06/17/2020  . Localized osteoarthritis of left knee 06/11/2020  . NSVT (nonsustained ventricular tachycardia) ([place])   . Hypertension   . LVH (left ventricular hypertrophy) due to hypertensive disease   . Ascending aorta dilation (HCC) 07/29/2019  . Nonsustained ventricular tachycardia ([place]) 07/23/2019  . Elevated hemoglobin A1c 06/11/2019  . Irregular heart rate 06/11/2019  . Corn or callus 06/11/2019  . Bleeding per rectum 12/09/2018  .  Hypokalemia 12/09/2018  . Screen for colon cancer 12/09/2018  . Constipation 12/01/2018  . Mass of thyroid region 03/28/2018  . Anemia of chronic disease 03/28/2018  . Noncompliance by refusing intervention or support 10/22/2016  . Refusal of medication 10/22/2016  . Edema 07/09/2016  . Depression 02/27/2016  . Stage 3b chronic kidney disease ([place]) 10/13/2014  . CKD (chronic kidney disease) stage 3, GFR 30-59 ml/min (HCC) 10/13/2014  . Hepatitis C virus infection cured after antiviral drug therapy 07/05/2014  . Hyperlipidemia 06/29/2014  . Prostate CA ([place]) 06/09/2014  . Essential hypertension 06/09/2014  . Onychomycosis 12/01/2013  . PVD (peripheral vascular disease) ([place]) 12/01/2013    Past Surgical History:  Procedure Laterality Date  . ACHILLES TENDON REPAIR    . CERVICAL DISC SURGERY     neck  . COLONOSCOPY     before 2010   . LYMPHADENECTOMY Bilateral 01/26/2016   Procedure: EXTENDED BILATERAL PELVIC LYMPHADENECTOMY;  Surgeon: Raynelle Bring, MD;  Location: [facility];  Service: Urology;  Laterality: Bilateral;  . NOSE SURGERY     in  high school-nasal fracturex2  . ROBOT ASSISTED LAPAROSCOPIC RADICAL PROSTATECTOMY N/A 01/26/2016   Procedure: XI ROBOTIC ASSISTED LAPAROSCOPIC RADICAL PROSTATECTOMY LEVEL 3;  Surgeon: Raynelle Bring, MD;  Location: [facility];  Service: Urology;  Laterality: N/A;       Family History  Problem Relation Age of Onset  . Heart disease Mother   . Obesity Mother   . Cervical cancer Mother   . Heart disease Father   . Diabetes Father   . ALS Sister   . Colon cancer Neg Hx   . Esophageal cancer Neg Hx   . Colon polyps Neg Hx   . Rectal cancer Neg Hx   . Stomach cancer Neg Hx     Social History   Tobacco Use  . Smoking status: Former Smoker    Packs/day: 0.50    Years: 10.00    Pack years: 5.00    Types: Cigarettes    Quit date: 01/11/2006    Years since quitting: 14.9  . Smokeless tobacco: Never Used  Vaping Use  . Vaping Use: Never used   Substance Use Topics  . Alcohol use: No  . Drug use: Not Currently    Home Medications Prior to Admission medications   Medication Sig Start Date End Date Taking? Authorizing Provider  Calcium Carbonate-Vit D-Min (CALCIUM 1200 PO) Take 1,200 mg by mouth daily.   Yes [provider]  carvedilol (COREG) 6.25 MG tablet TAKE 1 TABLET TWICE DAILY WITH MEALS Patient taking differently: Take 6.25 mg by mouth 2 (two) times daily with a meal. 10/09/20  Yes Dutch Quint B, FNP  ferrous sulfate 325 (65 FE) MG tablet Take 325 mg by mouth daily with breakfast.   Yes [provider]  hydrALAZINE (APRESOLINE) 100 MG tablet Take 100 mg by mouth 3 (three) times daily.   Yes [provider]  losartan (COZAAR) 50 MG tablet Take 1 tablet (50 mg total) by mouth 2 (two) times daily. Patient taking differently: Take 50 mg by mouth daily. 10/09/20  Yes Patrecia Pour, MD  oxybutynin (DITROPAN) 5 MG tablet Take 5 mg by mouth at bedtime. 08/05/20  Yes [provider]  cinacalcet (SENSIPAR) 30 MG tablet Take 30 mg by mouth daily. 12/22/20   [provider]  hydrALAZINE (APRESOLINE) 50 MG tablet Take 1 tablet (50 mg total) by mouth every 8 (eight) hours. Patient not taking: No sig reported 11/17/20   Ronnald Nian, DO    Allergies    Amlodipine and Tamsulosin hcl  Review of Systems   Review of Systems  Constitutional: Negative for fever.  HENT: Negative for ear pain and sore throat.   Eyes: Negative for pain.  Respiratory: Negative for cough.   Cardiovascular: Negative for chest pain.  Gastrointestinal: Negative for abdominal pain.  Genitourinary: Negative for flank pain.  Musculoskeletal: Negative for back pain.  Skin: Negative for color change and rash.  Neurological: Negative for syncope.  All other systems reviewed and are negative.   Physical Exam Updated Vital Signs BP (!) 198/104   Pulse 82   Temp 98.4 F (36.9 C) (Oral)   Resp 19   SpO2 100%    Physical Exam Constitutional:      General: He is not in acute distress.    Appearance: He is well-developed.  HENT:     Head: Normocephalic.     Nose: Nose normal.  Eyes:     Extraocular Movements: Extraocular movements intact.  Cardiovascular:     Rate and Rhythm:  Normal rate.  Pulmonary:     Effort: Pulmonary effort is normal.  Skin:    Coloration: Skin is not jaundiced.  Neurological:     General: No focal deficit present.     Mental Status: He is alert. Mental status is at baseline.     Cranial Nerves: No cranial nerve deficit.     Motor: No weakness.     Comments: Patient has no new focal deficit.  He is at his baseline moderately antalgic gait.  Uses a cane at baseline.     ED Results / Procedures / Treatments   Labs (all labs ordered are listed, but only abnormal results are displayed) Labs Reviewed  COMPREHENSIVE METABOLIC PANEL - Abnormal; Notable for the following components:      Result Value   Glucose, Bld 114 (*)    BUN 42 (*)    Creatinine, Ser 2.26 (*)    Total Protein 8.2 (*)    GFR, Estimated 30 (*)    All other components within normal limits  CBC WITH DIFFERENTIAL/PLATELET - Abnormal; Notable for the following components:   RBC 4.10 (*)    Hemoglobin 12.8 (*)    Platelets 148 (*)    Lymphs Abs 0.5 (*)    All other components within normal limits  URINALYSIS, ROUTINE W REFLEX MICROSCOPIC - Abnormal; Notable for the following components:   Color, Urine COLORLESS (*)    All other components within normal limits  CBG MONITORING, ED - Abnormal; Notable for the following components:   Glucose-Capillary 111 (*)    All other components within normal limits  RAPID URINE DRUG SCREEN, HOSP PERFORMED    EKG EKG Interpretation  Date/Time:  Monday December 26 2020 12:49:37 EST Ventricular Rate:  73 PR Interval:    QRS Duration: 101 QT Interval:  399 QTC Calculation: 440 R Axis:   50 Text Interpretation: Sinus rhythm Atrial premature complex , new  since last tracing Borderline repolarization abnormality Baseline wander in lead(s) V1 Confirmed by Dorie Rank [phone]) on 12/26/2020 1:27:37 PM   Radiology CT Head Wo Contrast  Result Date: 12/26/2020 CLINICAL DATA:  Altered mental status EXAM: CT HEAD WITHOUT CONTRAST TECHNIQUE: Contiguous axial images were obtained from the base of the skull through the vertex without intravenous contrast. COMPARISON:  None. FINDINGS: Brain: Normal anatomic configuration. Parenchymal volume loss is commensurate with the patient's age. Mild periventricular white matter changes are present likely reflecting the sequela of small vessel ischemia. Remote lacunar infarcts are noted within the thalami bilaterally as well as within the central pons. No abnormal intra or extra-axial mass lesion or fluid collection. No abnormal mass effect or midline shift. No evidence of acute intracranial hemorrhage or infarct. Ventricular size is normal. Cerebellum unremarkable. Vascular: No asymmetric hyperdense vasculature at the skull base. Skull: Intact Sinuses/Orbits: Paranasal sinuses are clear. Orbits are unremarkable. Other: Mastoid air cells and middle ear cavities are clear. IMPRESSION: Senescent changes. Remote pontine and bilateral thalamic infarcts. No evidence of acute intracranial hemorrhage or infarct. Electronically Signed   By: Fidela Salisbury MD   On: 12/26/2020 15:01    Procedures Procedures   Medications Ordered in ED Medications  hydrALAZINE (APRESOLINE) tablet 50 mg (50 mg Oral Given 12/26/20 2040)    ED Course  I have reviewed the triage vital signs and the nursing notes.  Pertinent labs & imaging results that were available during my care of the patient were reviewed by me and considered in my medical decision making (see chart for details).  MDM Rules/Calculators/A&P                          Show normal white count normal hemoglobin normal chemistry.  Mild persistent renal insufficiency noted.  Blood  pressure initially was within normal limits however during his prolonged ER stay it appears to be creeping upwards.  He did not take his additional blood pressure medication today.  Provider here in the ER now.  CT imaging of the brain shows no acute findings.  Age-indeterminate old infarct noted.  Patient states he has an appointment with his neurologist this week which I advised him to keep.  Urinalysis is otherwise unremarkable, patient discharged home stable condition advised me to return for worsening symptoms fevers vomiting pain or any additional concerns otherwise keep his appointment with neurology within the week.   Final Clinical Impression(s) / ED Diagnoses Final diagnoses:  Generalized weakness    Rx / DC Orders ED Discharge Orders    None       Luna Fuse, MD 12/26/20 2220

## 2020-12-27 ENCOUNTER — Ambulatory Visit (INDEPENDENT_AMBULATORY_CARE_PROVIDER_SITE_OTHER): Payer: Medicare Other | Admitting: Family Medicine

## 2020-12-27 ENCOUNTER — Encounter: Payer: Self-pay | Admitting: Family Medicine

## 2020-12-27 VITALS — BP 118/68 | HR 81 | Temp 97.8°F | Ht 70.0 in | Wt 181.2 lb

## 2020-12-27 DIAGNOSIS — I10 Essential (primary) hypertension: Secondary | ICD-10-CM | POA: Diagnosis not present

## 2020-12-27 DIAGNOSIS — I119 Hypertensive heart disease without heart failure: Secondary | ICD-10-CM

## 2020-12-27 DIAGNOSIS — N183 Chronic kidney disease, stage 3 unspecified: Secondary | ICD-10-CM | POA: Diagnosis not present

## 2020-12-27 DIAGNOSIS — N1832 Chronic kidney disease, stage 3b: Secondary | ICD-10-CM | POA: Diagnosis not present

## 2020-12-27 DIAGNOSIS — I739 Peripheral vascular disease, unspecified: Secondary | ICD-10-CM | POA: Diagnosis not present

## 2020-12-27 DIAGNOSIS — D638 Anemia in other chronic diseases classified elsewhere: Secondary | ICD-10-CM

## 2020-12-27 NOTE — Progress Notes (Signed)
[facility] [address] [place] [address] Dept: [phone] Dept Fax: [phone]  Acute Office Visit  Subjective:    Patient ID: Casey Greene, male    DOB: [birth year], 70 y.o..   MRN: [id number]  Chief Complaint  Patient presents with  . Follow-up    Follow up from ED visit yesterday seen for not feeling like himself like something was wrong mentally. Also concerns about elevated BP.     History of Present Illness:  Patient is in today for reassessment after an ED visit yesterday. He notes that he had just not been feeling his usual and could not be more specific about that. In the ER he was noted to have an elevated BP. He had missed a dose of his usual BP meds during the long wait for the ED and this did improve once he received his meds. Today, he relates that he feels he has returned to normal. Casey Greene has a history of hypertension, hypertensive heart disease and Stage 3 chronic kidney disease. His nephrologist, Dr. Abbe Amsterdam, recent made some changes to his blood pressure meds. He has upcoming appointments with his cardiologist and a neurologist.  Casey Greene wife prompted him to remember that he noted some mild "trouble breathing" yesterday. It is not clear if this was more lung or cardiac related. He has not had recent swelling in his legs. He is not having cough.  Past Medical History: Patient Active Problem List   Diagnosis Date Noted  . History of urinary frequency 12/05/2020  . Slow heartbeat   . Hypertensive heart disease   . History of gout   . Foley catheter in place   . Dysrhythmia   . Decreased appetite   . Hypertensive urgency 10/07/2020  . Laryngeal mass 06/17/2020  . DOE (dyspnea on exertion) 06/17/2020  . Localized osteoarthritis of left knee 06/11/2020  . NSVT (nonsustained ventricular tachycardia) ([place])   . Hypertension   . LVH (left ventricular hypertrophy) due to hypertensive disease   .  Ascending aorta dilation (HCC) 07/29/2019  . Nonsustained ventricular tachycardia ([place]) 07/23/2019  . Elevated hemoglobin A1c 06/11/2019  . Irregular heart rate 06/11/2019  . Corn or callus 06/11/2019  . Bleeding per rectum 12/09/2018  . Hypokalemia 12/09/2018  . Screen for colon cancer 12/09/2018  . Constipation 12/01/2018  . Mass of thyroid region 03/28/2018  . Anemia of chronic disease 03/28/2018  . Noncompliance by refusing intervention or support 10/22/2016  . Refusal of medication 10/22/2016  . Edema 07/09/2016  . Depression 02/27/2016  . Stage 3b chronic kidney disease ([place]) 10/13/2014  . CKD (chronic kidney disease) stage 3, GFR 30-59 ml/min (HCC) 10/13/2014  . Hepatitis C virus infection cured after antiviral drug therapy 07/05/2014  . Hyperlipidemia 06/29/2014  . Prostate CA ([place]) 06/09/2014  . Essential hypertension 06/09/2014  . Onychomycosis 12/01/2013  . PVD (peripheral vascular disease) ([place]) 12/01/2013   Past Surgical History:  Procedure Laterality Date  . ACHILLES TENDON REPAIR    . CERVICAL DISC SURGERY     neck  . COLONOSCOPY     before 2010   . LYMPHADENECTOMY Bilateral 01/26/2016   Procedure: EXTENDED BILATERAL PELVIC LYMPHADENECTOMY;  Surgeon: Raynelle Bring, MD;  Location: [facility];  Service: Urology;  Laterality: Bilateral;  . NOSE SURGERY     in high school-nasal fracturex2  . ROBOT ASSISTED LAPAROSCOPIC RADICAL PROSTATECTOMY N/A 01/26/2016   Procedure: XI ROBOTIC ASSISTED LAPAROSCOPIC RADICAL PROSTATECTOMY LEVEL 3;  Surgeon: Raynelle Bring, MD;  Location: [facility];  Service: Urology;  Laterality: N/A;   Family History  Problem Relation Age of Onset  . Heart disease Mother   . Obesity Mother   . Cervical cancer Mother   . Heart disease Father   . Diabetes Father   . ALS Sister   . Colon cancer Neg Hx   . Esophageal cancer Neg Hx   . Colon polyps Neg Hx   . Rectal cancer Neg Hx   . Stomach cancer Neg Hx    Outpatient Medications Prior to Visit   Medication Sig Dispense Refill  . aspirin EC 81 MG tablet Take 81 mg by mouth daily. Swallow whole.    . carvedilol (COREG) 6.25 MG tablet TAKE 1 TABLET TWICE DAILY WITH MEALS (Patient taking differently: Take 6.25 mg by mouth 2 (two) times daily with a meal.) 90 tablet 0  . ferrous sulfate 325 (65 FE) MG tablet Take 325 mg by mouth daily with breakfast.    . hydrALAZINE (APRESOLINE) 100 MG tablet Take 100 mg by mouth 3 (three) times daily.    Marland Kitchen losartan (COZAAR) 50 MG tablet Take 1 tablet (50 mg total) by mouth 2 (two) times daily. (Patient taking differently: Take 50 mg by mouth daily.)    . oxybutynin (DITROPAN) 5 MG tablet Take 5 mg by mouth at bedtime.    . cinacalcet (SENSIPAR) 30 MG tablet Take 30 mg by mouth daily.    . Calcium Carbonate-Vit D-Min (CALCIUM 1200 PO) Take 1,200 mg by mouth daily. (Patient not taking: Reported on 12/27/2020)    . hydrALAZINE (APRESOLINE) 50 MG tablet Take 1 tablet (50 mg total) by mouth every 8 (eight) hours. (Patient not taking: No sig reported) 270 tablet 1   No facility-administered medications prior to visit.   Allergies  Allergen Reactions  . Amlodipine Other (See Comments)    headache  . Tamsulosin Hcl      Objective:   Today's Vitals   12/27/20 1613  BP: 118/68  Pulse: 81  Temp: 97.8 F (36.6 C)  TempSrc: Temporal  SpO2: 97%  Weight: 181 lb 3.2 oz (82.2 kg)  Height: 5\' 10"  (1.778 m)   Body mass index is 26 kg/m.   General: Well developed, well nourished. No acute distress. Neck: No JVD. Lungs: Clear to auscultation bilaterally. CV: RRR without murmurs or rubs. Pulses 2+ bilaterally. Extremities: No edema noted.  Health Maintenance Due  Topic Date Due  . TETANUS/TDAP  Never done  . INFLUENZA VACCINE  Never done   LABS   Lab Results  Component Value Date   WBC 6.7 12/26/2020   HGB 12.8 (L) 12/26/2020   HCT 40.6 12/26/2020   MCV 99.0 12/26/2020   PLT 148 (L) 12/26/2020   Assessment & Plan:   1. Essential  hypertension Blood pressure is in normal range today. The episode yesterday may have related to some adjustment due to the recent medication change. Recommend we monitor this for now.  2. Stage 3b chronic kidney disease ([place]) We did discuss how the kidney disease may be impacting both blood pressure control and the mild anemia seen int he ED.  3. Hypertensive heart disease, unspecified whether heart failure present Stable. No sign of decompensation.  4. Anemia of chronic disease Mild anemia seen in the ED. Recommend we check iron indices. The kidney disease may be precipitating the aanemia due to low erythropoietin levels. Recommend continued follow-up with nephrology.   - Iron, TIBC and Ferritin Panel    Annie Main  Freddie Apley, MD

## 2020-12-28 LAB — IRON,TIBC AND FERRITIN PANEL
%SAT: 29 % (calc) (ref 20–48)
Ferritin: 55 ng/mL (ref 24–380)
Iron: 82 ug/dL (ref 50–180)
TIBC: 286 mcg/dL (calc) (ref 250–425)

## 2020-12-30 ENCOUNTER — Telehealth: Payer: Self-pay

## 2020-12-30 ENCOUNTER — Encounter: Payer: Self-pay | Admitting: Psychology

## 2020-12-30 ENCOUNTER — Other Ambulatory Visit: Payer: Self-pay

## 2020-12-30 ENCOUNTER — Ambulatory Visit (INDEPENDENT_AMBULATORY_CARE_PROVIDER_SITE_OTHER): Payer: Medicare Other | Admitting: Psychology

## 2020-12-30 ENCOUNTER — Ambulatory Visit: Payer: Medicare Other | Admitting: Psychology

## 2020-12-30 DIAGNOSIS — G3184 Mild cognitive impairment, so stated: Secondary | ICD-10-CM

## 2020-12-30 DIAGNOSIS — I491 Atrial premature depolarization: Secondary | ICD-10-CM | POA: Insufficient documentation

## 2020-12-30 DIAGNOSIS — I639 Cerebral infarction, unspecified: Secondary | ICD-10-CM

## 2020-12-30 DIAGNOSIS — R4189 Other symptoms and signs involving cognitive functions and awareness: Secondary | ICD-10-CM

## 2020-12-30 DIAGNOSIS — F0670 Mild neurocognitive disorder due to known physiological condition without behavioral disturbance: Secondary | ICD-10-CM

## 2020-12-30 HISTORY — DX: Mild cognitive impairment, so stated: G31.84

## 2020-12-30 HISTORY — DX: Mild neurocognitive disorder due to known physiological condition without behavioral disturbance: F06.70

## 2020-12-30 NOTE — Progress Notes (Signed)
NEUROPSYCHOLOGICAL EVALUATION [place]  Date of Evaluation: December 30, 2020  Reason for Referral:   Casey Greene is a 70 y.o. right-handed African-American male referred by Letta Median, D.O., to characterize his current cognitive functioning and assist with diagnostic clarity and treatment planning in the context of memory loss and numerous cardiovascular ailments.   Assessment and Plan:   Clinical Impression(s): [name] of performance is suggestive of fairly diffuse cognitive dysfunction with primary impairments surrounding executive functioning, receptive language, semantic fluency, confrontation naming, and visuospatial abilities. Verbal memory also represented a notable weakness, while variability was exhibited across processing speed and attention/concentration. Performance was adequate across phonemic fluency, safety/judgment, and his retention of a previously learned complex figure. Casey Greene wife manages his medications and their personal finances and expressed concerns that Casey Greene would be unable to perform these tasks independently. Casey Greene, however, reported that he would be able to perform these actions independently and rather is appreciative that his wife performs these actions for him. Overall, he is likely on the cusp between mild and major neurocognitive disorder distinctions. However, at the present time, he is perhaps best characterized as being towards the severe end of the mild neurocognitive disorder ("mild cognitive impairment") spectrum.  The culprit for cognitive dysfunction is unclear, largely due to the diffuse nature of impairment. I believe it very likely that there is a significant vascular etiology given his numerous cardiovascular ailments and neuroimaging suggesting remote pontine and bilateral thalamic infarcts. However, while a vascular cause would certainly explain deficits in  processing speed, attention/concentration, executive functioning, verbal fluency, and encoding/retrieval aspects of memory, the extent of dysfunction in receptive language, confrontation naming, and visuospatial abilities would be surprising. As such, there remains the possibility for an underlying neurodegenerative condition exacerbated by cerebrovascular changes (i.e., a "mixed dementia" presentation). Alzheimer's disease represents the most common neurodegenerative illness and could explain deficits in confrontation naming and visuospatial abilities. However, while Casey Greene had trouble learning and later recalling verbal information, he was not fully amnestic across these tasks and his retention of visual information remained relatively strong, which would be inconsistent with this condition. Lewy body dementia represents the second most common neurodegenerative illness and would certainly explain a majority of his cognitive deficits outside of confrontation naming and receptive language. However, he does not display common behavioral characteristics of this condition, namely parkinsonian features, frequent fluctuations in alertness, fully-formed visual hallucinations, or REM sleep behaviors, making this condition less likely. I do not have concerns surrounding frontotemporal dementia. Overall, more firm conclusions cannot be made at the present time. Continued medical monitoring will be important moving forward.   Recommendations: A repeat neuropsychological evaluation in 18 months (or sooner if functional decline is noted) is recommended to assess the trajectory of future cognitive decline should it occur. This will also aid in future efforts towards improved diagnostic clarity.  He may wish to discuss medication options for memory loss and cognitive dysfunction with his PCP/medical team. A referral to a neurologist may be prudent if he is not already followed by one. It is important to highlight that  memory-focused medications have been shown to slow functional decline in some individuals. However, if a neurodegenerative condition is indeed present, no current treatment is able to fully stop or reverse cognitive decline.   He should also discuss the pros and cons of additional neuroimaging (brain MRI) with his medical team as this would provide greater detail surrounding anatomical changes.  Should there be a progression of his current deficits over time, Casey Greene is unlikely to regain any independent living skills lost. Therefore, it is recommended that he remain as involved as possible in all aspects of household chores, finances, and medication management, with supervision to ensure adequate performance. He will likely benefit from the establishment and maintenance of a routine in order to maximize his functional abilities over time.  If not already done, Casey Greene and his family may want to discuss his wishes regarding durable power of attorney and medical decision making, so that he can have input into these choices. Additionally, they may wish to discuss future plans for caretaking and seek out community options for in home/residential care should they become necessary.  Casey Greene is encouraged to attend to lifestyle factors for brain health (e.g., regular physical exercise, good nutrition habits, regular participation in cognitively-stimulating activities, and general stress management techniques), which are likely to have benefits for both emotional adjustment and cognition. Optimal control of vascular risk factors (including safe cardiovascular exercise and adherence to dietary recommendations) is encouraged.   Performance across neurocognitive testing is not a strong predictor of an individual's safety operating a motor vehicle. However, he did exhibit deficits across processing speed, complex attention, executive functioning, and visuospatial abilities, all of which could certainly  compromise driving abilities. I would recommend that he undergo a formalized driving evaluation. He would be encouraged to contact [facility] [employer] in [place], [place] at [phone]. Another option would be through [facility]; however, the latter would likely require a referral from a medical doctor. [facility] can be reached directly at [phone].   When learning new information, he would benefit from information being broken up into small, manageable pieces. He may also find it helpful to articulate the material in his own words and in a context to promote encoding at the onset of a new task. This material may need to be repeated multiple times to promote encoding.  Memory can be improved using internal strategies such as rehearsal, repetition, chunking, mnemonics, association, and imagery. External strategies such as written notes in a consistently used memory journal, visual and nonverbal auditory cues such as a calendar on the refrigerator or appointments with alarm, such as on a cell phone, can also help maximize recall.    To address problems with processing speed, he may wish to consider:   -Ensuring that he is alerted when essential material or instructions are being presented   -Adjusting the speed at which new information is presented   -Allowing for more time in comprehending, processing, and responding in conversation  To address problems with fluctuating attention, he may wish to consider:   -Avoiding external distractions when needing to concentrate   -Limiting exposure to fast paced environments with multiple sensory demands   -Writing down complicated information and using checklists   -Attempting and completing one task at a time (i.e., no multi-tasking)   -Verbalizing aloud each step of a task to maintain focus   -Reducing the amount of information considered at one time  Review of Records:   Mr. Ludlam was referred for a neuropsychological  evaluation on 11/03/2020 by family medicine Letta Median, D.O.) due to a report of slowly progressive memory decline. I am unable to locate any notes authored by Dr. Bryan Lemma around that time. It appears that Mr. Heymann most recently met with her on 10/12/2020 for follow-up of numerous cardiovascular ailments. Memory loss was not reported during that visit.  There is a phone encounter on 11/01/2020 which Mr. Parenteau expressed concerns surrounding an "increase in my dementia symptoms." No further details were provided. Ultimately, Mr. Ida was referred for a comprehensive neuropsychological evaluation to characterize his cognitive abilities and to assist with diagnostic clarity and treatment planning.   Mr. Bartholomew was seen briefly in the ED on 12/26/2020 with a chief complaint of "feeling off." He was seen by family medicine Arlester Marker, M.D.) on 12/27/2020 for follow-up. During his ED visit, his blood pressure was noted to be elevated. Dr. Gena Fray felt that his ED episode may have related to some adjustment due to a recent medication change. Monitoring was recommended at that time.  Head CT on 12/26/2020 revealed volume loss commensurate with Mr. Getter age, as well as mild small vessel ischemia. Remote pontine and bilateral thalamic infarcts were also reported.   Past Medical History:  Diagnosis Date  . Anemia of chronic disease 03/28/2018  . Ascending aorta dilation 07/29/2019   (a) Echo 07/29/19 22m  . Bleeding per rectum 12/09/2018  . CKD (chronic kidney disease) stage 3, GFR 30-59 ml/min (HCC) 10/13/2014   Discussed with patient importance of good BP control, avoiding nephrotoxic meds/supplements in preventing further progression  . Constipation 12/01/2018  . Corn or callus 06/11/2019  . Decreased appetite   . DOE (dyspnea on exertion) 06/17/2020  . Dysrhythmia   . Edema 07/09/2016  . Elevated hemoglobin A1c 06/11/2019  . Essential hypertension 06/09/2014   Overview:  metoprolol caused  bradycardia amlodipine causes headache, leg swelling  Last Assessment & Plan:  We had a long discussion regarding goals of treatment of HTN to prevent worsening cardiac and renal disease.  We also discussed options to change his regimen.  He states that he would prefer to seek a practitioner who can recommend herbal remedies as he feels thhis will be a healthier path f  . Hepatitis C virus infection cured after antiviral drug therapy 07/05/2014  . History of gout   . History of urinary frequency 12/05/2020  . Hyperlipidemia 06/29/2014  . Hypokalemia 12/09/2018  . Laryngeal mass 06/17/2020  . Localized osteoarthritis of left knee 06/11/2020   Left knee injection-06/10/2020  . LVH (left ventricular hypertrophy) due to hypertensive disease    (a) echo 07/29/19 severe concentric LVH  . Major depressive disorder 02/27/2016  . Mass of thyroid region 03/28/2018  . Nonsustained ventricular tachycardia 07/23/2019   (a) Zio monitor 06/2019. Subsequent echo with severe concentric LVH, but normal EF 55-60%  . Onychomycosis 12/01/2013  . PAC (premature atrial contraction)    (a) ZIO 06/2019. Asymptomatic  . Prostate cancer 1997   Dx in 1997, never treated, taking herbal supplements. S/p prostatectomy 2017  . PVD (peripheral vascular disease) 12/01/2013  . Stroke    Per 12/26/20 MRI - remote pontine and bilateral thalamic infarcts   . Urinary retention     Past Surgical History:  Procedure Laterality Date  . ACHILLES TENDON REPAIR    . CERVICAL DISC SURGERY     neck  . COLONOSCOPY     before 2010   . LYMPHADENECTOMY Bilateral 01/26/2016   Procedure: EXTENDED BILATERAL PELVIC LYMPHADENECTOMY;  Surgeon: LRaynelle Bring MD;  Location: [facility];  Service: Urology;  Laterality: Bilateral;  . NOSE SURGERY     in high school-nasal fracturex2  . ROBOT ASSISTED LAPAROSCOPIC RADICAL PROSTATECTOMY N/A 01/26/2016   Procedure: XI ROBOTIC ASSISTED LAPAROSCOPIC RADICAL PROSTATECTOMY LEVEL 3;  Surgeon: LRaynelle Bring MD;   Location: [facility];  Service: Urology;  Laterality: N/A;    Current Outpatient Medications:  .  aspirin EC 81 MG tablet, Take 81 mg by mouth daily. Swallow whole., Disp: , Rfl:  .  carvedilol (COREG) 6.25 MG tablet, TAKE 1 TABLET TWICE DAILY WITH MEALS (Patient taking differently: Take 6.25 mg by mouth 2 (two) times daily with a meal.), Disp: 90 tablet, Rfl: 0 .  cinacalcet (SENSIPAR) 30 MG tablet, Take 30 mg by mouth daily., Disp: , Rfl:  .  ferrous sulfate 325 (65 FE) MG tablet, Take 325 mg by mouth daily with breakfast., Disp: , Rfl:  .  hydrALAZINE (APRESOLINE) 100 MG tablet, Take 100 mg by mouth 3 (three) times daily., Disp: , Rfl:  .  losartan (COZAAR) 50 MG tablet, Take 1 tablet (50 mg total) by mouth 2 (two) times daily. (Patient taking differently: Take 50 mg by mouth daily.), Disp: , Rfl:  .  oxybutynin (DITROPAN) 5 MG tablet, Take 5 mg by mouth at bedtime., Disp: , Rfl:   Clinical Interview:   The following information was obtained during a clinical interview with Mr. Mehring and his wife.  Cognitive Symptoms: Decreased short-term memory: Endorsed. Mr. Agresta reported largely generalized difficulties with memory, stating that he "just forgets things sometimes." His wife reported him having trouble recalling previous conversations and names of familiar individuals, as well as misplacing things around their home. She stated that memory deficits have gradually worsened over the past three years.  Decreased long-term memory: Denied. Decreased attention/concentration: Denied. Reduced processing speed: Endorsed. He reported processing speed being "slowed down a little bit."  Difficulties with executive functions: Denied. He also denied trouble with impulsivity and the presence of overt personality changes. His wife reported longstanding and unchanged difficulties with organization.  Difficulties with emotion regulation: Denied. Difficulties with receptive language: Denied. Difficulties with  word finding: Endorsed "sometimes."  Decreased visuoperceptual ability: Denied.  Difficulties completing ADLs: Unclear. His wife manages his medications and their personal finances. Mr. Romey stated his belief that he could perform these actions independently if the need arose. However, his wife did not appear to agree with this belief. She further noted that prior to her taking over medication management, he was becoming ill due to improper medication adherence. He continues to drive without issue. His wife described an instance this past Christmas where he was unable to locate his car in a large [place] parking lot. She also described a remote instance where he appeared "panicky" and was unable to drive for a brief period of time while they were on vacation.   Additional Medical History: History of traumatic brain injury/concussion: Denied. History of stroke: Recent neuroimaging suggested remote pontine and bilateral thalamic infarcts. These were likely asymptomatic as Mr. Cuffee and his wife denied him ever experiencing classic stroke symptoms. It was unclear when these would have occurred.  History of seizure activity: Denied. History of known exposure to toxins: Denied. Symptoms of chronic pain: He reported ongoing left knee pain for the past six months. Per his report, these symptoms were attributed to a fall. Medical records report osteoarthritis in his left knee dating back to around that time. No other chronic pain symptoms were reported.  Experience of frequent headaches/migraines: Denied. His wife reported a remote history of frequent headache symptoms. However, these largely subsided on their own.  Frequent instances of dizziness/vertigo: Denied.  Sensory changes: He wears glasses with positive effect. His wife reported the likelihood of ongoing hearing loss. She stated that Mr. Niblack has been resistant to undergoing a  formal hearing evaluation. Other sensory changes/difficulties (e.g.,  taste or smell) were denied.  Balance/coordination difficulties: Denied. Since his knee injury about six months ago, he does ambulate with a cane with positive effect. Outside of the fall that led to his knee injury, no other falls were reported.  Other motor difficulties: Denied. However, his wife reported sporadic tremulous behaviors in his upper extremities bilaterally which were said to seemingly occur at random.   Sleep History: Estimated hours obtained each night: 4 hours.  Difficulties falling asleep: Denied. Difficulties staying asleep: Endorsed. Mr. Schinke reported a prior history of prostate surgery (related to his history of prostate cancer), causing him to frequently wake throughout the night in order to use the restroom. Because of this, he described his sleep as very broken.  Feels rested and refreshed upon awakening: Denied.  History of snoring: Endorsed per his wife.  History of waking up gasping for air: Denied. Witnessed breath cessation while asleep: Denied.  History of vivid dreaming: Denied. Excessive movement while asleep: Denied. Instances of acting out his dreams: Denied.  Psychiatric/Behavioral Health History: Depression: He alluded to a history of depression in the past, largely attributed to his previous working environment. 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 Mr. Lichty denied ever taking mood-related medications or engaging in psychotherapy. He described his current mood as "okay." His wife reported a potential period of depression where Mr. Tigert was very quiet this past November around the time he became unemployed. This was said to last for a few weeks. Current or remote suicidal ideation, intent, or plan was denied.  Anxiety: Denied. Mania: Denied. Trauma History: Denied. Visual/auditory  hallucinations: Denied. Delusional thoughts: Denied.  Tobacco: Denied. He reported quitting in the distant past.  Alcohol: He denied current alcohol consumption as well as a history of problematic alcohol abuse or dependence.  Recreational drugs: Denied. He did acknowledge remote marijuana use socially. His wife added that he went through a "cocaine situation" approximately 12 years prior. He admitted that he developed a Greene of cocaine use and likely abuse, attributing at least some of this to coping with work-related mood issues. He has maintained his sobriety since.   Family History: Problem Relation Age of Onset  . Heart disease Mother   . Obesity Mother   . Cervical cancer Mother   . Dementia Mother        unspecified type; symptom onset in late 60s/early 56s  . Heart disease Father   . Diabetes Father   . Stroke Father   . ALS Sister   . Stroke Paternal Grandmother   . Stroke Paternal Grandfather   . Colon cancer Neg Hx   . Esophageal cancer Neg Hx   . Colon polyps Neg Hx   . Rectal cancer Neg Hx   . Stomach cancer Neg Hx    This information was confirmed by Mr. Rahn.  Academic/Vocational History: Highest level of educational attainment: 15 years. He graduated from high school and completed an additional three years of college. He described himself as an average (C) student in academic settings. Math was noted as a likely relative weakness.  History of developmental delay: Denied. History of grade repetition: Endorsed. He reported repeating the 3rd grade. However, he was unable to provide a specific reason why other than stating that a classmate of his was "distracting" and that  he was likely not paying well enough attention while in school.  Enrollment in special education courses: Denied. History of LD/ADHD: Denied.  Employment: Unemployed. He previously worked many years for the [employer] After that, he worked in [place]. He reported currently  seeking employment.   Evaluation Results:   Behavioral Observations: Mr. Haynesworth was accompanied by his wife and was appropriately dressed and groomed. He appeared alert and oriented. He ambulated slowly with the assistance of a cane, favoring his left leg/knee. Gross motor functioning appeared intact upon informal observation and no abnormal movements (e.g., tremors) were noted. His affect was generally relaxed and positive, but did range appropriately given the subject being discussed during the clinical interview or the task at hand during testing procedures. Spontaneous speech was fluent and word finding difficulties were not observed during the clinical interview. Thought processes were coherent, organized, and normal in content. Insight into his cognitive difficulties appeared adequate. During testing, he exhibited a noted response latency, often requiring additional prompts by the psychometrist to answer questions asked of him. Sustained attention was appropriate. Task engagement was adequate and he persisted when challenged. He did exhibit some difficulties understanding task instructions, especially across more complex tasks (D-KEFS Color-Word, Verbal Fluency). He was also noted to appear quite fatigued while being tested. Overall, Mr. Theiler was cooperative with the clinical interview and subsequent testing procedures.   Adequacy of Effort: The validity of neuropsychological testing is limited by the extent to which the individual being tested may be assumed to have exerted adequate effort during testing. Mr. Danese expressed his intention to perform to the best of his abilities and exhibited adequate task engagement and persistence. Scores across stand-alone and embedded performance validity measures were variable. However, variability is likely related to true cognitive dysfunction rather than poor engagement or attempts to perform poorly. As such, the results of the current evaluation are  believed to be a valid representation of Mr. Vane's current cognitive functioning.  Test Results: Mr. Simington was largely oriented at the time of the current evaluation.  Intellectual abilities based upon educational and vocational attainment were estimated to be in the below average to average range. Premorbid abilities were estimated to be within the lower limits of the average range based upon a single-word reading test.   Processing speed was exceptionally low to below average. Basic attention was well below average to average. More complex attention (e.g., working memory) was well below average to below average. Executive functioning was average across a visuomotor cognitive flexibility task but exceptionally low to well below average across all other related tasks. He scored in the below average range across a task assessing safety and judgment.  Assessed receptive language abilities were exceptionally low. He appeared to have trouble with sequencing commands, following multi-step commands, and understanding more complex sentence structure. He also did have difficulty understanding instructions across more complex tasks throughout the evaluation. Assessed expressive language was mildly variable. Phonemic fluency was below average, while semantic fluency and confrontation naming were exceptionally low.   Outside of his drawing of a clock, assessed visuospatial/visuoconstructional abilities were exceptionally low to well below average. Points were lost on his copy of a complex figure due to very poor attention to detail where he omitted several internal aspects of the larger figure altogether.    Learning (i.e., encoding) of novel verbal information was exceptionally low. Spontaneous delayed recall (i.e., retrieval) of previously learned information was well below average to average. Retention rates were 100% (raw score  of 4) across a story learning task, 40% (raw score of 2) across a list learning  task, and 85% across a complex figure drawing task. Performance across recognition tasks was exceptionally low to average, suggesting some evidence for information consolidation.   Results of emotional screening instruments suggested that recent symptoms of generalized anxiety were in the minimal range, while symptoms of depression were within normal limits. A screening instrument assessing recent sleep quality suggested the presence of minimal sleep dysfunction.  Tables of Scores:   Note: This summary of test scores accompanies the interpretive report and should not be considered in isolation without reference to the appropriate sections in the text. Descriptors are based on appropriate normative data and may be adjusted based on clinical judgment. The terms "impaired" and "within normal limits (WNL)" are used when a more specific level of functioning cannot be determined.       Effort Testing:   DESCRIPTOR       Dot Counting Test: --- --- Within Expectation  RBANS Effort Index: --- --- Below Expectation  WAIS-IV Reliable Digit Span: --- --- Within Expectation       Orientation:      Raw Score Percentile   NAB Orientation, Form 1 28/29 --- ---       Cognitive Screening:           Raw Score Percentile   SLUMS: 20/30 --- ---       RBANS, Form A: Standard Score/ Scaled Score Percentile   Total Score 53 <1 Exceptionally Low  Immediate Memory 53 <1 Exceptionally Low    List Learning 3 1 Exceptionally Low    Story Memory 2 <1 Exceptionally Low  Visuospatial/Constructional 60 <1 Exceptionally Low    Figure Copy 4 2 Well Below Average    Line Orientation 6/20 <2 Exceptionally Low  Language 60 <1 Exceptionally Low    Picture Naming 8/10 3-9 Well Below Average    Semantic Fluency 2 <1 Exceptionally Low  Attention 68 2 Exceptionally Low    Digit Span 4 2 Well Below Average    Coding 6 9 Below Average  Delayed Memory 68 2 Exceptionally Low    List Recall 2/10 10-16 Below Average    List  Recognition 15/20 <2 Exceptionally Low    Story Recall 5 5 Well Below Average    Story Recognition 8/12 8-15 Below Average    Figure Recall 9 37 Average    Figure Recognition 7/8 53-69 Average       Intellectual Functioning:           Standard Score Percentile   Test of Premorbid Functioning: 91 27 Average       Attention/Executive Function:          Trail Making Test (TMT): Raw Score (T Score) Percentile     Part A 78 secs.,  0 errors (35) 7 Well Below Average    Part B 144 secs.,  1 error (49) 46 Average         Scaled Score Percentile   WAIS-IV Digit Span: 4 2 Well Below Average    Forward 8 25 Average    Backward 6 9 Below Average    Sequencing 4 2 Well Below Average        Scaled Score Percentile   WAIS-IV Similarities: 5 5 Well Below Average       D-KEFS Color-Word Interference Test: Raw Score (Scaled Score) Percentile     Color Naming 51 secs. (3) 1 Exceptionally Low  Word Reading 52 secs. (1) <1 Exceptionally Low    Inhibition 104 secs. (5) 5 Well Below Average      Total Errors 5 errors (8) 25 Average    Inhibition/Switching 118 secs. (5) 5 Well Below Average      Total Errors 5 errors (8) 25 Average       D-KEFS Verbal Fluency Test: Raw Score (Scaled Score) Percentile     Letter Total Correct 24 (7) 16 Below Average    Category Total Correct 22 (6) 9 Below Average    Category Switching Total Correct 4 (1) <1 Exceptionally Low    Category Switching Accuracy 2 (1) <1 Exceptionally Low      Total Set Loss Errors 4 (8) 25 Average      Total Repetition Errors 3 (10) 50 Average       NAB Executive Functions Module, Form 1: T Score Percentile     Judgment 39 14 Below Average       Language:          Verbal Fluency Test: Raw Score (T Score) Percentile     Phonemic Fluency (FAS) 24 (41) 18 Below Average    Animal Fluency 7 (27) 1 Exceptionally Low        NAB Language Module, Form 1: T Score Percentile     Auditory Comprehension 21 <1 Exceptionally Low     Naming 21/31 (19) <1 Exceptionally Low       Visuospatial/Visuoconstruction:      Raw Score Percentile   Clock Drawing: 10/10 --- Within Normal Limits       Mood and Personality:      Raw Score Percentile   Geriatric Depression Scale: 3 --- Within Normal Limits  Geriatric Anxiety Scale: 7 --- Minimal    Somatic 1 --- Minimal    Cognitive 5 --- Mild    Affective 1 --- Minimal       Additional Questionnaires:      Raw Score Percentile   PROMIS Sleep Disturbance Questionnaire: 17 --- None to Slight   Informed Consent and Coding/Compliance:   Mr. Favor was provided with a verbal description of the nature and purpose of the present neuropsychological evaluation. Also reviewed were the foreseeable risks and/or discomforts and benefits of the procedure, limits of confidentiality, and mandatory reporting requirements of this provider. The patient was given the opportunity to ask questions and receive answers about the evaluation. Oral consent to participate was provided by the patient.   This evaluation was conducted by Christia Reading, Ph.D., licensed clinical neuropsychologist. Mr. Tozzi completed a comprehensive clinical interview with Dr. Melvyn Novas, billed as one unit [phone], and 120 minutes of cognitive testing and scoring, billed as one unit [phone] and three additional units 96139. Psychometrist Milana Kidney, B.S., assisted Dr. Melvyn Novas with test administration and scoring procedures. As a separate and discrete service, Dr. Melvyn Novas spent a total of 160 minutes in interpretation and report writing billed as one unit [phone] and two units 96133.

## 2020-12-30 NOTE — Progress Notes (Signed)
   Psychometrician Note   Cognitive testing was administered to Casey Greene by Milana Kidney, B.S. (psychometrist) under the supervision of Dr. Christia Reading, Ph.D., licensed psychologist on 12/30/20. Casey Greene did not appear overtly distressed by the testing session per behavioral observation or responses across self-report questionnaires. Dr. Christia Reading, Ph.D. checked in with Casey Greene as needed to manage any distress related to testing procedures (if applicable). Rest breaks were offered.    The battery of tests administered was selected by Dr. Christia Reading, Ph.D. with consideration to Casey Greene current level of functioning, the nature of his symptoms, emotional and behavioral responses during interview, level of literacy, observed level of motivation/effort, and the nature of the referral question. This battery was communicated to the psychometrist. Communication between Dr. Christia Reading, Ph.D. and the psychometrist was ongoing throughout the evaluation and Dr. Christia Reading, Ph.D. was immediately accessible at all times. Dr. Christia Reading, Ph.D. provided supervision to the psychometrist on the date of this service to the extent necessary to assure the quality of all services provided.    Casey Greene will return within approximately 1-2 weeks for an interactive feedback session with Dr. Melvyn Novas at which time his test performances, clinical impressions, and treatment recommendations will be reviewed in detail. Casey Greene understands he can contact our office should he require our assistance before this time.  A total of 120 minutes of billable time were spent face-to-face with Casey Greene by the psychometrist. This includes both test administration and scoring time. Billing for these services is reflected in the clinical report generated by Dr. Christia Reading, Ph.D..  This note reflects time spent with the psychometrician and does not include test scores or any clinical  interpretations made by Dr. Melvyn Novas. The full report will follow in a separate note.

## 2020-12-30 NOTE — Progress Notes (Signed)
Chronic Care Management Pharmacy Assistant   Name: Casey Greene  MRN: [id number] DOB: 1951-06-14  Reason for Encounter:Hypertension Disease State Call.  Patient Questions:  1.  Have you seen any other providers since your last visit? Yes, 12/26/2020 ED Weakness, 12/27/2020 [id number]  2.  Any changes in your medicines or health? No   PCP : Libby Maw, MD  Allergies:   Allergies  Allergen Reactions  . Amlodipine Other (See Comments)    headache  . Tamsulosin Hcl     Medications: Outpatient Encounter Medications as of 12/30/2020  Medication Sig Note  . aspirin EC 81 MG tablet Take 81 mg by mouth daily. Swallow whole.   . carvedilol (COREG) 6.25 MG tablet TAKE 1 TABLET TWICE DAILY WITH MEALS (Patient taking differently: Take 6.25 mg by mouth 2 (two) times daily with a meal.)   . cinacalcet (SENSIPAR) 30 MG tablet Take 30 mg by mouth daily. 12/26/2020: Not started yet - can't afford  . ferrous sulfate 325 (65 FE) MG tablet Take 325 mg by mouth daily with breakfast.   . hydrALAZINE (APRESOLINE) 100 MG tablet Take 100 mg by mouth 3 (three) times daily.   Marland Kitchen losartan (COZAAR) 50 MG tablet Take 1 tablet (50 mg total) by mouth 2 (two) times daily. (Patient taking differently: Take 50 mg by mouth daily.)   . oxybutynin (DITROPAN) 5 MG tablet Take 5 mg by mouth at bedtime. 11/28/2020: Prescribed by Dr. Junious Silk   No facility-administered encounter medications on file as of 12/30/2020.    Current Diagnosis: Patient Active Problem List   Diagnosis Date Noted  . PAC (premature atrial contraction)   . Stroke   . History of urinary frequency 12/05/2020  . History of gout   . Dysrhythmia   . Decreased appetite   . Laryngeal mass 06/17/2020  . DOE (dyspnea on exertion) 06/17/2020  . Localized osteoarthritis of left knee 06/11/2020  . LVH (left ventricular hypertrophy) due to hypertensive disease   . Ascending aorta dilation 07/29/2019  . Nonsustained ventricular tachycardia  07/23/2019  . Elevated hemoglobin A1c 06/11/2019  . Bleeding per rectum 12/09/2018  . Hypokalemia 12/09/2018  . Constipation 12/01/2018  . Mass of thyroid region 03/28/2018  . Anemia of chronic disease 03/28/2018  . Edema 07/09/2016  . Major depressive disorder 02/27/2016  . CKD (chronic kidney disease) stage 3, GFR 30-59 ml/min 10/13/2014  . Hepatitis C virus infection cured after antiviral drug therapy 07/05/2014  . Hyperlipidemia 06/29/2014  . Prostate cancer 06/09/2014  . Essential hypertension 06/09/2014  . Onychomycosis 12/01/2013  . PVD (peripheral vascular disease) ([facility]) 12/01/2013    Goals Addressed   None    Reviewed chart prior to disease state call. Spoke with patient regarding BP  Recent Office Vitals: BP Readings from Last 3 Encounters:  12/27/20 118/68  12/26/20 (!) 198/104  12/05/20 (!) 172/102   Pulse Readings from Last 3 Encounters:  12/27/20 81  12/26/20 82  12/05/20 60    Wt Readings from Last 3 Encounters:  12/27/20 181 lb 3.2 oz (82.2 kg)  12/05/20 186 lb 9.6 oz (84.6 kg)  10/18/20 184 lb (83.5 kg)     Kidney Function Lab Results  Component Value Date/Time   CREATININE 2.26 (H) 12/26/2020 12:59 PM   CREATININE 2.37 (H) 10/09/2020 08:18 AM   CREATININE 2.66 (H) 06/17/2020 03:35 PM   CREATININE 2.29 (H) 06/10/2018 03:47 PM   GFR 30.42 (L) 10/27/2019 01:28 PM   GFRNONAA 30 (L) 12/26/2020 12:59 PM  GFRNONAA 24 (L) 05/01/2018 02:54 PM   GFRAA 31 04/13/2020 12:00 AM   GFRAA 28 (L) 05/01/2018 02:54 PM    BMP Latest Ref Rng & Units 12/26/2020 10/09/2020 10/08/2020  Glucose 70 - 99 mg/dL 114(H) 111(H) 101(H)  BUN 8 - 23 mg/dL 42(H) 39(H) 33(H)  Creatinine 0.61 - 1.24 mg/dL 2.26(H) 2.37(H) 1.99(H)  BUN/Creat Ratio 6 - 22 (calc) - - -  Sodium 135 - 145 mmol/L 141 138 141  Potassium 3.5 - 5.1 mmol/L 3.6 3.6 2.8(L)  Chloride 98 - 111 mmol/L 105 105 104  CO2 22 - 32 mmol/L 25 27 28   Calcium 8.9 - 10.3 mg/dL 10.3 10.1 10.3    . Current  antihypertensive regimen:   Carvedilol 6.25 mg twice daily   Hydralazine 50 mg three times daily    Losartan 50 mg daily  . How often are you checking your Blood Pressure? daily . Current home BP readings:  o Patient states his blood pressure machine is 72 years old ,and does not give correct readings. o On 12/30/2020 it was 172/85 o Patient states his"blood pressure ranges around 150/80 at home but is normal at his doctor office". . What recent interventions/DTPs have been made by any provider to improve Blood Pressure control since last CPP Visit: None ID  . Any recent hospitalizations or ED visits since last visit with CPP? Yes  o 12/26/2020 ED Weakness  . What diet changes have been made to improve Blood Pressure Control?  o Patient states he cooks at home without salt and has plenty of vegetables.  . What exercise is being done to improve your Blood Pressure Control?  o Patient reports he barley exercise due to using a cane to support his balance.  Adherence Review: Is the patient currently on ACE/ARB medication? Yes Does the patient have >5 day gap between last estimated fill dates? Yes   Casey Greene  Follow-Up:  Pharmacist Review   [place] Clinical Pharmacist Assistant [phone]

## 2021-01-06 ENCOUNTER — Encounter: Payer: Self-pay | Admitting: Neurology

## 2021-01-06 ENCOUNTER — Other Ambulatory Visit: Payer: Self-pay

## 2021-01-06 ENCOUNTER — Ambulatory Visit (INDEPENDENT_AMBULATORY_CARE_PROVIDER_SITE_OTHER): Payer: Medicare Other | Admitting: Psychology

## 2021-01-06 DIAGNOSIS — I639 Cerebral infarction, unspecified: Secondary | ICD-10-CM | POA: Diagnosis not present

## 2021-01-06 DIAGNOSIS — F0670 Mild neurocognitive disorder due to known physiological condition without behavioral disturbance: Secondary | ICD-10-CM

## 2021-01-06 DIAGNOSIS — G3184 Mild cognitive impairment, so stated: Secondary | ICD-10-CM

## 2021-01-06 NOTE — Patient Instructions (Signed)
A repeat neuropsychological evaluation in 18 months (or sooner if functional decline is noted) is recommended to assess the trajectory of future cognitive decline should it occur. This will also aid in future efforts towards improved diagnostic clarity.  He may wish to discuss medication options for memory loss and cognitive dysfunction with his PCP/medical team. A referral to a neurologist may be prudent if he is not already followed by one. It is important to highlight that memory-focused medications have been shown to slow functional decline in some individuals. However, if a neurodegenerative condition is indeed present, no current treatment is able to fully stop or reverse cognitive decline.   He should also discuss the pros and cons of additional neuroimaging (brain MRI) with his medical team as this would provide greater detail surrounding anatomical changes.   Should there be a progression of his current deficits over time, Casey Greene is unlikely to regain any independent living skills lost. Therefore, it is recommended that he remain as involved as possible in all aspects of household chores, finances, and medication management, with supervision to ensure adequate performance. He will likely benefit from the establishment and maintenance of a routine in order to maximize his functional abilities over time.  If not already done, Casey Greene and his family may want to discuss his wishes regarding durable power of attorney and medical decision making, so that he can have input into these choices. Additionally, they may wish to discuss future plans for caretaking and seek out community options for in home/residential care should they become necessary.  Casey Greene is encouraged to attend to lifestyle factors for brain health (e.g., regular physical exercise, good nutrition habits, regular participation in cognitively-stimulating activities, and general stress management techniques), which are  likely to have benefits for both emotional adjustment and cognition. Optimal control of vascular risk factors (including safe cardiovascular exercise and adherence to dietary recommendations) is encouraged.   Performance across neurocognitive testing is not a strong predictor of an individual's safety operating a motor vehicle. However, he did exhibit deficits across processing speed, complex attention, executive functioning, and visuospatial abilities, all of which could certainly compromise driving abilities. I would recommend that he undergo a formalized driving evaluation. He would be encouraged to [employer] [facility] in [place], [place] at [phone].Another option would be through [facility]; however, the latter would likely require a referral from a medical doctor. [facility] can be reached directly at [phone].  When learning new information, he would benefit from information being broken up into small, manageable pieces. He may also find it helpful to articulate the material in his own words and in a context to promote encoding at the onset of a new task. This material may need to be repeated multiple times to promote encoding.  Memory can be improved using internal strategies such as rehearsal, repetition, chunking, mnemonics, association, and imagery. External strategies such as written notes in a consistently used memory journal, visual and nonverbal auditory cues such as a calendar on the refrigerator or appointments with alarm, such as on a cell phone, can also help maximize recall.    To address problems with processing speed, he may wish to consider:   -Ensuring that he is alerted when essential material or instructions are being presented   -Adjusting the speed at which new information is presented   -Allowing for more time in comprehending, processing, and responding in conversation  To address problems with fluctuating attention, he may wish to  consider:   -  Avoiding external distractions when needing to concentrate   -Limiting exposure to fast paced environments with multiple sensory demands   -Writing down complicated information and using checklists   -Attempting and completing one task at a time (i.e., no multi-tasking)   -Verbalizing aloud each step of a task to maintain focus   -Reducing the amount of information considered at one time

## 2021-01-06 NOTE — Progress Notes (Signed)
   Neuropsychology Feedback Session Casey Greene. [place] Department of Neurology  Reason for Referral:   Casey Greene a 70 y.o. right-handed African-American male referred by Letta Median, D.O.,to characterize hiscurrent cognitive functioning and assist with diagnostic clarity and treatment planning in the context of memory loss and numerous cardiovascular ailments.   Feedback:   Casey Greene completed a comprehensive neuropsychological evaluation on 12/30/2020. Please refer to that encounter for the full report and recommendations. Briefly, results suggested fairly diffuse cognitive dysfunction with primary impairments surrounding executive functioning, receptive language, semantic fluency, confrontation naming, and visuospatial abilities. Verbal memory also represented a notable weakness, while variability was exhibited across processing speed and attention/concentration. The culprit for cognitive dysfunction is unclear, largely due to the diffuse nature of impairment. I believe it likely that there is a significant vascular etiology given his numerous cardiovascular ailments and neuroimaging suggesting remote pontine and bilateral thalamic infarcts. However, while a vascular cause would certainly explain deficits in processing speed, attention/concentration, executive functioning, verbal fluency, and encoding/retrieval aspects of memory, the extent of dysfunction in receptive language, confrontation naming, and visuospatial abilities would be surprising. As such, there remains the possibility for an underlying neurodegenerative condition exacerbated by cerebrovascular changes (i.e., a "mixed dementia" presentation).   Casey Greene was accompanied by his wife during the current feedback appointment. Content of the current session focused on the results of his neuropsychological evaluation. Casey Greene and his wife were given the opportunity to ask questions and their questions  were answered. They were encouraged to reach out should additional questions arise. A copy of his report was provided at the conclusion of the visit.      26 minutes were spent conducting the current feedback session with Casey Greene, billed as one unit [phone].

## 2021-01-20 ENCOUNTER — Ambulatory Visit: Payer: Medicare Other | Admitting: Family Medicine

## 2021-01-21 ENCOUNTER — Encounter: Payer: Self-pay | Admitting: Family Medicine

## 2021-01-21 DIAGNOSIS — I10 Essential (primary) hypertension: Secondary | ICD-10-CM

## 2021-01-23 ENCOUNTER — Telehealth: Payer: Self-pay

## 2021-01-23 MED ORDER — HYDRALAZINE HCL 100 MG PO TABS: 100.0000 mg | ORAL_TABLET | Freq: Three times a day (TID) | ORAL | 3 refills | Status: DC

## 2021-01-23 NOTE — Progress Notes (Signed)
Left Voice message to  confirmed patient telephone appointment on 01/24/2021 for CCM at 10:00 am with Junius Argyle the Clinical pharmacist.    [place] Pharmacist Assistant [phone]

## 2021-01-24 ENCOUNTER — Ambulatory Visit (INDEPENDENT_AMBULATORY_CARE_PROVIDER_SITE_OTHER): Payer: Medicare Other

## 2021-01-24 DIAGNOSIS — I10 Essential (primary) hypertension: Secondary | ICD-10-CM

## 2021-01-24 DIAGNOSIS — E782 Mixed hyperlipidemia: Secondary | ICD-10-CM | POA: Diagnosis not present

## 2021-01-24 NOTE — Patient Instructions (Signed)
Visit Information It was great speaking with you today!  Please let me know if you have any questions about our visit.  Goals Addressed            This Visit's Progress   . Track and Manage My Blood Pressure-Hypertension       Timeframe:  Long-Range Goal Priority:  High Start Date:  01/24/2021                           Expected End Date: 07/27/2021                      Follow Up Date 03/25/2021    - purchase a new blood pressure monitor  - check blood pressure 3 times per week - write blood pressure results in a log or diary    Why is this important?    You won't feel high blood pressure, but it can still hurt your blood vessels.   High blood pressure can cause heart or kidney problems. It can also cause a stroke.   Making lifestyle changes like losing a little weight or eating less salt will help.   Checking your blood pressure at home and at different times of the day can help to control blood pressure.   If the doctor prescribes medicine remember to take it the way the doctor ordered.   Call the office if you cannot afford the medicine or if there are questions about it.     Notes:        Patient Care Plan: General Pharmacy (Adult)    Problem Identified: Hypertension, Hyperlipidemia, Chronic Kidney Disease, Overactive Bladder and Peripheral Vascular Disease, Mild cognitive impairment   Priority: High    Long-Range Goal: Patient-Specific Goal   Start Date: 01/24/2021  Expected End Date: 07/27/2021  This Visit's Progress: On track  Priority: High  Note:   Current Barriers:  . Unable to achieve control of blood pressure    Pharmacist Clinical Goal(s):  Marland Kitchen Over the next 90 days, patient will achieve control of blood pressure as evidenced by BP less than 130/80 through collaboration with PharmD and provider.   Interventions: . 1:1 collaboration with Libby Maw, MD regarding development and update of comprehensive plan of care as evidenced by provider  attestation and co-signature . Inter-disciplinary care team collaboration (see longitudinal plan of care) . Comprehensive medication review performed; medication list updated in electronic medical record  Hypertension (BP goal <130/80) -Not ideally controlled -Current treatment: . Carvedilol 6.25 mg twice daily  . Hydralazine 100 mg three times daily  . Losartan 50 mg twice daily  -Medications previously tried: Amlodipine   -Current home readings: 139/82 (patient had a 41+ year old monitor)   -Current dietary habits: Patient does not follow any specific dietary regimen  -Current exercise habits: patient does not follow any specific exercise regimen  -Denies hypotensive/hypertensive symptoms -Educated on Daily salt intake goal < 2300 mg; Importance of home blood pressure monitoring; Proper BP monitoring technique; -Counseled to monitor BP at home 2-3 times weekly, document, and provide log at future appointments -Recommended patient purchase new blood pressure monitor to provide more accurate home readings  Hyperlipidemia: (LDL goal < 70) -History of stroke, PVD  -Uncontrolled -Current treatment: . None -Medications previously tried: NA -Educated on Benefits of statin for ASCVD risk reduction;  -Patient is a candidate for High-intensity statin given history of PVD and stroke. Patient prefers to avoid adding  additional medication agents at this time.  -Recommended rechecking fasting lipid panel  -Recommend starting atorvastatin 40 mg daily for secondary prevention of stroke.   Overactive Bladder (Goal: maintain symptom relief of urinary medications) -Controlled -Current treatment  . Oxybutynin 5 mg nightly  -Medications previously tried: NA  -Recommended to continue current medication  Mild Cognitive Impairment (Goal: Prevent decline in memory) -Controlled, per patient -Current treatment  . None -Medications previously tried: NA - Patient feels his memory has been stable at  this time. We discussed the possibility of Donepezil 5 mg daily to prevent or slow the progression of his cognitive impairment. Patient prefers to avoid adding additional medication agents at this time.   -Counseled on diet and exercise extensively  Patient Goals/Self-Care Activities . Over the next 90 days, patient will:  - check blood pressure 2-3 times weekly, document, and provide at future appointments  Follow Up Plan: Telephone follow up appointment with care management team member scheduled for: 05/01/2021 at 10:00 AM       Patient agreed to services and verbal consent obtained.   The patient verbalized understanding of instructions, educational materials, and care plan provided today and declined offer to receive copy of patient instructions, educational materials, and care plan.   Doristine Section Clinical Pharmacist [facility]  [phone]

## 2021-01-24 NOTE — Progress Notes (Signed)
Chronic Care Management Pharmacy Note  01/24/2021 Name:  Casey Greene MRN:  [id number] DOB:  03/22/1951  Subjective: Casey Greene is an 70 y.o. year old male who is a primary patient of Libby Maw, MD.  The CCM team was consulted for assistance with disease management and care coordination needs.    Engaged with patient by telephone for follow up visit in response to provider referral for pharmacy case management and/or care coordination services.   Consent to Services:  The patient was given information about Chronic Care Management services, agreed to services, and gave verbal consent prior to initiation of services.  Please see initial visit note for detailed documentation.   Patient Care Team: Libby Maw, MD as PCP - General (Family Medicine) Park Liter, MD as PCP - Cardiology (Cardiology) Germaine Pomfret, [facility] as Pharmacist (Pharmacist) Festus Aloe, MD as Consulting Physician (Urology) Rosita Fire, MD as Consulting Physician (Nephrology)  Recent office visits: 12/27/20: Patient presented to Dr. Gena Fray for follow-up. Hydralazine increased to 100 mg three times daily. Calcium stopped.   Recent consult visits: 12/27/20: Patient presented to Dr. Melvyn Novas (neurology) for follow-up. No medication changes made.  12/30/20: Patient presented to Dr. Melvyn Novas (neurology) for follow-up. Patient with mild cognitive impairment.    Hospital visits: 12/26/20: Patient presented to ED for generalized weakness.  Objective:  Lab Results  Component Value Date   CREATININE 2.26 (H) 12/26/2020   BUN 42 (H) 12/26/2020   GFR 30.42 (L) 10/27/2019   GFRNONAA 30 (L) 12/26/2020   GFRAA 31 04/13/2020   NA 141 12/26/2020   K 3.6 12/26/2020   CALCIUM 10.3 12/26/2020   CO2 25 12/26/2020    Lab Results  Component Value Date/Time   HGBA1C 5.8 06/11/2019 10:38 AM   HGBA1C 11.2 04/23/2019 12:00 AM   GFR 30.42 (L) 10/27/2019 01:28 PM   GFR 26.45 (L) 06/11/2019  10:38 AM   MICROALBUR 13.8 (H) 03/28/2018 12:06 PM   MICROALBUR 3.40 (H) 05/24/2010 10:08 PM    Last diabetic Eye exam: No results found for: HMDIABEYEEXA  Last diabetic Foot exam: No results found for: HMDIABFOOTEX   Lab Results  Component Value Date   CHOL 190 03/28/2018   HDL 61.90 03/28/2018   LDLCALC 116 (H) 03/28/2018   TRIG 61.0 03/28/2018   CHOLHDL 3 03/28/2018    Hepatic Function Latest Ref Rng & Units 12/26/2020 04/13/2020 12/23/2019  Total Protein 6.5 - 8.1 g/dL 8.2(H) - -  Albumin 3.5 - 5.0 g/dL 4.6 4.1 4.3  AST 15 - 41 U/L 18 - -  ALT 0 - 44 U/L 10 - -  Alk Phosphatase 38 - 126 U/L 53 - -  Total Bilirubin 0.3 - 1.2 mg/dL 0.8 - -    Lab Results  Component Value Date/Time   TSH 2.72 03/28/2018 12:06 PM   TSH 1.748 05/24/2010 10:08 PM    CBC Latest Ref Rng & Units 12/26/2020 10/07/2020 06/17/2020  WBC 4.0 - 10.5 K/uL 6.7 6.5 6.6  Hemoglobin 13.0 - 17.0 g/dL 12.8(L) 12.5(L) 12.0(L)  Hematocrit 39.0 - 52.0 % 40.6 37.3(L) 36.1(L)  Platelets 150 - 400 K/uL 148(L) 170 169    Lab Results  Component Value Date/Time   VD25OH 24.7 04/13/2020 12:00 AM   VD25OH 55 12/23/2019 12:00 AM    Clinical ASCVD: Yes  The ASCVD Risk score Mikey Bussing DC Jr., et al., 2013) failed to calculate for the following reasons:   The patient has a prior MI or stroke diagnosis  Depression screen [facility] 2/9 12/27/2020 12/03/2019 01/05/2019  Decreased Interest 0 0 1  Down, Depressed, Hopeless 0 0 1  PHQ - 2 Score 0 0 2  Altered sleeping - - 0  Tired, decreased energy - - 0  Change in appetite - - 0  Feeling bad or failure about yourself  - - 0  Trouble concentrating - - 0  Moving slowly or fidgety/restless - - 0  Suicidal thoughts - - 0  PHQ-9 Score - - 2  Difficult doing work/chores - - Not difficult at all     Social History   Tobacco Use  Smoking Status Former Smoker  . Packs/day: 0.50  . Years: 10.00  . Pack years: 5.00  . Types: Cigarettes  . Quit date: 01/11/2006  . Years since  quitting: 15.0  Smokeless Tobacco Never Used   BP Readings from Last 3 Encounters:  12/27/20 118/68  12/26/20 (!) 198/104  12/05/20 (!) 172/102   Pulse Readings from Last 3 Encounters:  12/27/20 81  12/26/20 82  12/05/20 60   Wt Readings from Last 3 Encounters:  12/27/20 181 lb 3.2 oz (82.2 kg)  12/05/20 186 lb 9.6 oz (84.6 kg)  10/18/20 184 lb (83.5 kg)    Assessment/Interventions: Review of patient past medical history, allergies, medications, health status, including review of consultants reports, laboratory and other test data, was performed as part of comprehensive evaluation and provision of chronic care management services.   SDOH:  (Social Determinants of Health) assessments and interventions performed: Yes SDOH Interventions   Flowsheet Row Most Recent Value  SDOH Interventions   Financial Strain Interventions Intervention Not Indicated      CCM Care Plan  Allergies  Allergen Reactions  . Amlodipine Other (See Comments)    headache  . Tamsulosin Hcl     Medications Reviewed Today    Reviewed by [name], MD (Physician) on 12/27/20 at [place] List Status: <None>  Medication Order Taking? Sig Documenting Provider Last Dose Status Informant  aspirin EC 81 MG tablet [id number] Yes Take 81 mg by mouth daily. Swallow whole. [provider] Taking Active        Patient not taking:      Discontinued 12/27/20 1643 (Discontinued by provider)   carvedilol (COREG) 6.25 MG tablet [id number] Yes TAKE 1 TABLET TWICE DAILY WITH MEALS  Patient taking differently: Take 6.25 mg by mouth 2 (two) times daily with a meal.   Dutch Quint B, FNP Taking Active   cinacalcet (SENSIPAR) 30 MG tablet [id number] No Take 30 mg by mouth daily. [provider] Unknown Active Spouse/Significant Other           Med Note Fransico Meadow Dec 26, 2020  8:37 PM) Not started yet - can't afford  ferrous sulfate 325 (65 FE) MG tablet [id number] Yes Take 325 mg by  mouth daily with breakfast. [provider] Taking Active Spouse/Significant Other  hydrALAZINE (APRESOLINE) 100 MG tablet [id number] Yes Take 100 mg by mouth 3 (three) times daily. [provider] Taking Active Spouse/Significant Other       Patient not taking:      Discontinued 12/27/20 1644 (Discontinued by provider)   losartan (COZAAR) 50 MG tablet [id number] Yes Take 1 tablet (50 mg total) by mouth 2 (two) times daily.  Patient taking differently: Take 50 mg by mouth daily.   Patrecia Pour, MD Taking Active   oxybutynin (DITROPAN) 5 MG tablet [id number] Yes Take 5 mg by  mouth at bedtime. [provider] Taking Active Spouse/Significant Other           Med Note Ronette Deter Nov 28, 2020  9:27 AM) Prescribed by Dr. Mena Goes          Patient Active Problem List   Diagnosis Date Noted  . Mild neurocognitive disorder due to multiple etiologies 12/30/2020  . PAC (premature atrial contraction)   . Stroke   . History of urinary frequency 12/05/2020  . History of gout   . Dysrhythmia   . Decreased appetite   . Laryngeal mass 06/17/2020  . DOE (dyspnea on exertion) 06/17/2020  . Localized osteoarthritis of left knee 06/11/2020  . LVH (left ventricular hypertrophy) due to hypertensive disease   . Ascending aorta dilation 07/29/2019  . Nonsustained ventricular tachycardia 07/23/2019  . Elevated hemoglobin A1c 06/11/2019  . Bleeding per rectum 12/09/2018  . Hypokalemia 12/09/2018  . Constipation 12/01/2018  . Mass of thyroid region 03/28/2018  . Anemia of chronic disease 03/28/2018  . Edema 07/09/2016  . Major depressive disorder 02/27/2016  . CKD (chronic kidney disease) stage 3, GFR 30-59 ml/min 10/13/2014  . Hepatitis C virus infection cured after antiviral drug therapy 07/05/2014  . Hyperlipidemia 06/29/2014  . Prostate cancer 06/09/2014  . Essential hypertension 06/09/2014  . Onychomycosis 12/01/2013  . PVD (peripheral vascular disease)  (HCC) 12/01/2013    Immunization History  Administered Date(s) Administered  . Hepatitis A, Adult 05/01/2018, 01/05/2019  . Hepatitis B, adult 05/01/2018, 06/10/2018, 01/05/2019  . Hepb-cpg 12/05/2020  . Janssen (J&J) SARS-COV-2 Vaccination 02/29/2020, 11/11/2020  . Pneumococcal Polysaccharide-23 10/08/2020    Conditions to be addressed/monitored:  Hypertension, Hyperlipidemia, Chronic Kidney Disease, Overactive Bladder and Peripheral Vascular Disease, Mild cognitive impairment  Care Plan : General Pharmacy (Adult)  Updates made by Gaspar Cola, RPH since 01/24/2021 12:00 AM    Problem: Hypertension, Hyperlipidemia, Chronic Kidney Disease, Overactive Bladder and Peripheral Vascular Disease, Mild cognitive impairment   Priority: High    Long-Range Goal: Patient-Specific Goal   Start Date: 01/24/2021  Expected End Date: 07/27/2021  This Visit's Progress: On track  Priority: High  Note:   Current Barriers:  . Unable to achieve control of blood pressure    Pharmacist Clinical Goal(s):  Marland Kitchen Over the next 90 days, patient will achieve control of blood pressure as evidenced by BP less than 130/80 through collaboration with PharmD and provider.   Interventions: . 1:1 collaboration with Mliss Sax, MD regarding development and update of comprehensive plan of care as evidenced by provider attestation and co-signature . Inter-disciplinary care team collaboration (see longitudinal plan of care) . Comprehensive medication review performed; medication list updated in electronic medical record  Hypertension (BP goal <130/80) -Not ideally controlled -Current treatment: . Carvedilol 6.25 mg twice daily  . Hydralazine 100 mg three times daily  . Losartan 50 mg twice daily  -Medications previously tried: Amlodipine   -Current home readings: 139/82 (patient had a 45+ year old monitor)   -Current dietary habits: Patient does not follow any specific dietary regimen  -Current  exercise habits: patient does not follow any specific exercise regimen  -Denies hypotensive/hypertensive symptoms -Educated on Daily salt intake goal < 2300 mg; Importance of home blood pressure monitoring; Proper BP monitoring technique; -Counseled to monitor BP at home 2-3 times weekly, document, and provide log at future appointments -Recommended patient purchase new blood pressure monitor to provide more accurate home readings  Hyperlipidemia: (LDL goal < 70) -History of stroke,  PVD  -Uncontrolled -Current treatment: . None -Medications previously tried: NA -Educated on Benefits of statin for ASCVD risk reduction;  -Patient is a candidate for High-intensity statin given history of PVD and stroke. Patient prefers to avoid adding additional medication agents at this time.  -Recommended rechecking fasting lipid panel  -Recommend starting atorvastatin 40 mg daily for secondary prevention of stroke.   Overactive Bladder (Goal: maintain symptom relief of urinary medications) -Controlled -Current treatment  . Oxybutynin 5 mg nightly  -Medications previously tried: NA  -Recommended to continue current medication  Mild Cognitive Impairment (Goal: Prevent decline in memory) -Controlled, per patient -Current treatment  . None -Medications previously tried: NA - Patient feels his memory has been stable at this time. We discussed the possibility of Donepezil 5 mg daily to prevent or slow the progression of his cognitive impairment. Patient prefers to avoid adding additional medication agents at this time.   -Counseled on diet and exercise extensively  Patient Goals/Self-Care Activities . Over the next 90 days, patient will:  - check blood pressure 2-3 times weekly, document, and provide at future appointments  Follow Up Plan: Telephone follow up appointment with care management team member scheduled for: 05/01/2021 at 10:00 AM       Medication Assistance: None required.  Patient  affirms current coverage meets needs.  Patient's preferred pharmacy is:  [id number] [address], [address] AT [facility] [address] [place] [address] Phone: [phone] Fax: [phone]  Uses pill box? Yes Pt endorses 100% compliance, although he has expressed interest in weaning off medications in favor of herbal supplements   We discussed: Current pharmacy is preferred with insurance plan and patient is satisfied with pharmacy services Patient decided to: Continue current medication management strategy  Care Plan and Follow Up Patient Decision:  Patient agrees to Care Plan and Follow-up.  Plan: Telephone follow up appointment with care management team member scheduled for:  05/01/2021 at 10:00 AM  [facility]  [phone]

## 2021-01-27 DIAGNOSIS — D351 Benign neoplasm of parathyroid gland: Secondary | ICD-10-CM | POA: Diagnosis not present

## 2021-01-27 DIAGNOSIS — I129 Hypertensive chronic kidney disease with stage 1 through stage 4 chronic kidney disease, or unspecified chronic kidney disease: Secondary | ICD-10-CM | POA: Diagnosis not present

## 2021-01-27 DIAGNOSIS — N184 Chronic kidney disease, stage 4 (severe): Secondary | ICD-10-CM | POA: Diagnosis not present

## 2021-01-27 DIAGNOSIS — N189 Chronic kidney disease, unspecified: Secondary | ICD-10-CM | POA: Diagnosis not present

## 2021-01-27 DIAGNOSIS — D631 Anemia in chronic kidney disease: Secondary | ICD-10-CM | POA: Diagnosis not present

## 2021-01-31 DIAGNOSIS — R339 Retention of urine, unspecified: Secondary | ICD-10-CM | POA: Insufficient documentation

## 2021-02-02 ENCOUNTER — Other Ambulatory Visit: Payer: Self-pay

## 2021-02-02 DIAGNOSIS — I10 Essential (primary) hypertension: Secondary | ICD-10-CM

## 2021-02-02 MED ORDER — LOSARTAN POTASSIUM 50 MG PO TABS: 50.0000 mg | ORAL_TABLET | Freq: Two times a day (BID) | ORAL | Status: DC

## 2021-02-03 ENCOUNTER — Encounter: Payer: Self-pay | Admitting: Cardiology

## 2021-02-03 ENCOUNTER — Other Ambulatory Visit: Payer: Self-pay

## 2021-02-03 ENCOUNTER — Ambulatory Visit (INDEPENDENT_AMBULATORY_CARE_PROVIDER_SITE_OTHER): Payer: Medicare Other | Admitting: Cardiology

## 2021-02-03 VITALS — BP 110/72 | HR 73 | Ht 70.0 in | Wt 173.0 lb

## 2021-02-03 DIAGNOSIS — I77810 Thoracic aortic ectasia: Secondary | ICD-10-CM

## 2021-02-03 DIAGNOSIS — I10 Essential (primary) hypertension: Secondary | ICD-10-CM | POA: Diagnosis not present

## 2021-02-03 DIAGNOSIS — R0600 Dyspnea, unspecified: Secondary | ICD-10-CM | POA: Diagnosis not present

## 2021-02-03 DIAGNOSIS — I5032 Chronic diastolic (congestive) heart failure: Secondary | ICD-10-CM | POA: Diagnosis not present

## 2021-02-03 DIAGNOSIS — R0609 Other forms of dyspnea: Secondary | ICD-10-CM

## 2021-02-03 DIAGNOSIS — I5030 Unspecified diastolic (congestive) heart failure: Secondary | ICD-10-CM

## 2021-02-03 HISTORY — DX: Unspecified diastolic (congestive) heart failure: I50.30

## 2021-02-03 NOTE — Patient Instructions (Signed)
Medication Instructions:  Your physician recommends that you continue on your current medications as directed. Please refer to the Current Medication list given to you today.  *If you need a refill on your cardiac medications before your next appointment, please call your pharmacy*   Lab Work: Your physician recommends that you return for lab work today: bmp, pro bnp   If you have labs (blood work) drawn today and your tests are completely normal, you will receive your results only by: . MyChart Message (if you have MyChart) OR . A paper copy in the mail If you have any lab test that is abnormal or we need to change your treatment, we will call you to review the results.   Testing/Procedures: None.    Follow-Up: At [facility], you and your health needs are our priority.  As part of our continuing mission to provide you with exceptional heart care, we have created designated Provider Care Teams.  These Care Teams include your primary Cardiologist (physician) and Advanced Practice Providers (APPs -  Physician Assistants and Nurse Practitioners) who all work together to provide you with the care you need, when you need it.  We recommend signing up for the patient portal called "MyChart".  Sign up information is provided on this After Visit Summary.  MyChart is used to connect with patients for Virtual Visits (Telemedicine).  Patients are able to view lab/test results, encounter notes, upcoming appointments, etc.  Non-urgent messages can be sent to your provider as well.   To learn more about what you can do with MyChart, go to [email or web address].    Your next appointment:   2 month(s)  The format for your next appointment:   In Person  Provider:   Robert Krasowski, MD   Other Instructions    

## 2021-02-03 NOTE — Progress Notes (Signed)
\ Cardiology Office Note:    Date:  02/03/2021   ID:  Casey Greene, DOB Aug 07, 1951, MRN [id number]  PCP:  Libby Maw, MD  Cardiologist:  [name], MD    Referring MD: Libby Maw,*   Chief Complaint  Patient presents with  . Shortness of Breath    History of Present Illness:    Casey Greene is a 70 y.o. male with past medical history significant for essential hypertension, ascending aortic aneurysm, chronic kidney failure with creatinine of 2.26 in January of this year, left ventricle hypertrophy on echocardiogram.  He comes today 2 months for regular follow-up he can 41st time with his wife and she said that he has been having progressive shortness of breath over years s.  He tells me that he does not do much.  He sits in front of TV and does not do much.  He does complain of having some shortness of breath, no proximal nocturnal dyspnea no swelling of lower extremities no pain no palpitations dizziness.  Past Medical History:  Diagnosis Date  . Anemia of chronic disease 03/28/2018  . Ascending aorta dilation 07/29/2019   (a) Echo 07/29/19 33mm  . Bleeding per rectum 12/09/2018  . CKD (chronic kidney disease) stage 3, GFR 30-59 ml/min (HCC) 10/13/2014   Discussed with patient importance of good BP control, avoiding nephrotoxic meds/supplements in preventing further progression  . Constipation 12/01/2018  . Corn or callus 06/11/2019  . Decreased appetite   . Diastolic congestive heart failure ([place]) 02/03/2021  . DOE (dyspnea on exertion) 06/17/2020  . Dysrhythmia   . Edema 07/09/2016  . Elevated hemoglobin A1c 06/11/2019  . Essential hypertension 06/09/2014   Overview:  metoprolol caused bradycardia amlodipine causes headache, leg swelling  Last Assessment & Plan:  We had a long discussion regarding goals of treatment of HTN to prevent worsening cardiac and renal disease.  We also discussed options to change his regimen.  He states that he would prefer  to seek a practitioner who can recommend herbal remedies as he feels thhis will be a healthier path f  . Hepatitis C virus infection cured after antiviral drug therapy 07/05/2014  . History of gout   . History of urinary frequency 12/05/2020  . Hyperlipidemia 06/29/2014  . Hypokalemia 12/09/2018  . Laryngeal mass 06/17/2020  . Localized osteoarthritis of left knee 06/11/2020   Left knee injection-06/10/2020  . LVH (left ventricular hypertrophy) due to hypertensive disease    (a) echo 07/29/19 severe concentric LVH  . Major depressive disorder 02/27/2016  . Mass of thyroid region 03/28/2018  . Mild neurocognitive disorder due to multiple etiologies 12/30/2020  . Nonsustained ventricular tachycardia 07/23/2019   (a) Zio monitor 06/2019. Subsequent echo with severe concentric LVH, but normal EF 55-60%  . Onychomycosis 12/01/2013  . PAC (premature atrial contraction)    (a) ZIO 06/2019. Asymptomatic  . Prostate cancer 1997   Dx in 1997, never treated, taking herbal supplements. S/p prostatectomy 2017  . PVD (peripheral vascular disease) 12/01/2013  . Stroke    Per 12/26/20 MRI - remote pontine and bilateral thalamic infarcts   . Urinary retention     Past Surgical History:  Procedure Laterality Date  . ACHILLES TENDON REPAIR    . CERVICAL DISC SURGERY     neck  . COLONOSCOPY     before 2010   . LYMPHADENECTOMY Bilateral 01/26/2016   Procedure: EXTENDED BILATERAL PELVIC LYMPHADENECTOMY;  Surgeon: Raynelle Bring, MD;  Location: [facility];  Service: Urology;  Laterality: Bilateral;  . NOSE SURGERY     in high school-nasal fracturex2  . ROBOT ASSISTED LAPAROSCOPIC RADICAL PROSTATECTOMY N/A 01/26/2016   Procedure: XI ROBOTIC ASSISTED LAPAROSCOPIC RADICAL PROSTATECTOMY LEVEL 3;  Surgeon: Raynelle Bring, MD;  Location: [facility];  Service: Urology;  Laterality: N/A;    Current Medications: Current Meds  Medication Sig  . aspirin EC 81 MG tablet Take 81 mg by mouth daily. Swallow whole.  . carvedilol  (COREG) 6.25 MG tablet TAKE 1 TABLET TWICE DAILY WITH MEALS  . ferrous sulfate 325 (65 FE) MG tablet Take 325 mg by mouth daily with breakfast.  . hydrALAZINE (APRESOLINE) 100 MG tablet Take 1 tablet (100 mg total) by mouth 3 (three) times daily.  Marland Kitchen losartan (COZAAR) 50 MG tablet Take 1 tablet (50 mg total) by mouth 2 (two) times daily.  . Multiple Vitamin (MULTIVITAMIN ADULT PO) Take by mouth.  . oxybutynin (DITROPAN) 5 MG tablet Take 5 mg by mouth at bedtime.     Allergies:   Amlodipine and Tamsulosin hcl   Social History   Socioeconomic History  . Marital status: Married    Spouse name: Not on file  . Number of children: Not on file  . Years of education: 30  . Highest education level: Some college, no degree  Occupational History  . Occupation: Unemployed  Tobacco Use  . Smoking status: Former Smoker    Packs/day: 0.50    Years: 10.00    Pack years: 5.00    Types: Cigarettes    Quit date: 01/11/2006    Years since quitting: 15.0  . Smokeless tobacco: Never Used  Vaping Use  . Vaping Use: Never used  Substance and Sexual Activity  . Alcohol use: No  . Drug use: Not Currently    Types: Cocaine, Marijuana    Comment: hx of cocaine abuse; stopped approximately 12 years prior  . Sexual activity: Not Currently  Other Topics Concern  . Not on file  Social History Narrative  . Not on file   Social Determinants of Health   Financial Resource Strain: Low Risk   . Difficulty of Paying Living Expenses: Not hard at all  Food Insecurity: Not on file  Transportation Needs: No Transportation Needs  . Lack of Transportation (Medical): No  . Lack of Transportation (Non-Medical): No  Physical Activity: Not on file  Stress: Not on file  Social Connections: Not on file     Family History: The patient's family history includes ALS in his sister; Cervical cancer in his mother; Dementia in his mother; Diabetes in his father; Heart disease in his father and mother; Obesity in his  mother; Stroke in his father, paternal grandfather, and paternal grandmother. There is no history of Colon cancer, Esophageal cancer, Colon polyps, Rectal cancer, or Stomach cancer. ROS:   Please see the history of present illness.    All 14 point review of systems negative except as described per history of present illness  EKGs/Labs/Other Studies Reviewed:      Recent Labs: 12/26/2020: ALT 10; BUN 42; Creatinine, Ser 2.26; Hemoglobin 12.8; Platelets 148; Potassium 3.6; Sodium 141  Recent Lipid Panel    Component Value Date/Time   CHOL 190 03/28/2018 1206   TRIG 61.0 03/28/2018 1206   HDL 61.90 03/28/2018 1206   CHOLHDL 3 03/28/2018 1206   VLDL 12.2 03/28/2018 1206   LDLCALC 116 (H) 03/28/2018 1206    Physical Exam:    VS:  BP 110/72 (BP Location: Right Arm, Patient Position:  Sitting)   Pulse 73   Ht 5\' 10"  (1.778 m)   Wt 173 lb (78.5 kg)   SpO2 95%   BMI 24.82 kg/m     Wt Readings from Last 3 Encounters:  02/03/21 173 lb (78.5 kg)  12/27/20 181 lb 3.2 oz (82.2 kg)  12/05/20 186 lb 9.6 oz (84.6 kg)     GEN:  Well nourished, well developed in no acute distress HEENT: Normal NECK: No JVD; No carotid bruits LYMPHATICS: No lymphadenopathy CARDIAC: RRR, no murmurs, no rubs, no gallops RESPIRATORY:  Clear to auscultation without rales, wheezing or rhonchi  ABDOMEN: Soft, non-tender, non-distended MUSCULOSKELETAL:  No edema; No deformity  SKIN: Warm and dry LOWER EXTREMITIES: no swelling NEUROLOGIC:  Alert and oriented x 3 PSYCHIATRIC:  Normal affect   ASSESSMENT:    1. Dyspnea on exertion   2. Chronic diastolic congestive heart failure ([place])   3. Essential hypertension   4. Ascending aorta dilation   5. DOE (dyspnea on exertion)    PLAN:    In order of problems listed above:  1. Dyspnea on exertion probably multifactorial I will check proBNP as well as Chem-7.  He does have significant left ventricle hypertrophy I warned about diastolic dysfunctions.  Easiest  maneuver will be to put him on diuretics however because of his kidney dysfunction on the left him to do it.  Will wait for results of the test before committing him to some medications. 2. Diastolic congestive heart failure likely his blood pressure is well controlled we will continue present management future recommendation will be made based on results of the test. 3. Essential hypertension excellently controlled now.  Continue present management. 4. Ascending aortic dilatation continue monitoring managing his blood pressure.   Medication Adjustments/Labs and Tests Ordered: Current medicines are reviewed at length with the patient today.  Concerns regarding medicines are outlined above.  Orders Placed This Encounter  Procedures  . Basic metabolic panel  . Pro b natriuretic peptide (BNP)  . EKG 12-Lead   Medication changes: No orders of the defined types were placed in this encounter.   Signed, Park Liter, MD, [facility] 02/03/2021 10:54 AM    [place]

## 2021-02-04 ENCOUNTER — Encounter: Payer: Self-pay | Admitting: Family Medicine

## 2021-02-04 ENCOUNTER — Other Ambulatory Visit: Payer: Self-pay | Admitting: Family Medicine

## 2021-02-04 DIAGNOSIS — I10 Essential (primary) hypertension: Secondary | ICD-10-CM

## 2021-02-04 LAB — BASIC METABOLIC PANEL
BUN/Creatinine Ratio: 17 (ref 10–24)
BUN: 42 mg/dL — ABNORMAL HIGH (ref 8–27)
CO2: 24 mmol/L (ref 20–29)
Calcium: 10.6 mg/dL — ABNORMAL HIGH (ref 8.6–10.2)
Chloride: 105 mmol/L (ref 96–106)
Creatinine, Ser: 2.42 mg/dL — ABNORMAL HIGH (ref 0.76–1.27)
Glucose: 94 mg/dL (ref 65–99)
Potassium: 4.1 mmol/L (ref 3.5–5.2)
Sodium: 146 mmol/L — ABNORMAL HIGH (ref 134–144)
eGFR: 28 mL/min/{1.73_m2} — ABNORMAL LOW (ref >59)

## 2021-02-04 LAB — PRO B NATRIURETIC PEPTIDE: NT-Pro BNP: 635 pg/mL — ABNORMAL HIGH (ref 0–376)

## 2021-02-06 ENCOUNTER — Telehealth: Payer: Self-pay | Admitting: Emergency Medicine

## 2021-02-06 ENCOUNTER — Other Ambulatory Visit: Payer: Self-pay

## 2021-02-06 DIAGNOSIS — I10 Essential (primary) hypertension: Secondary | ICD-10-CM

## 2021-02-06 MED ORDER — LOSARTAN POTASSIUM 50 MG PO TABS: 50.0000 mg | ORAL_TABLET | Freq: Two times a day (BID) | ORAL | Status: DC

## 2021-02-06 NOTE — Telephone Encounter (Signed)
-----   Message from Park Liter, MD sent at 02/04/2021  4:04 PM EST ----- Lets put him on 20 mg of furosemide daily, he need to have a Chem-7 done within the next week

## 2021-02-06 NOTE — Telephone Encounter (Signed)
Called patient. Informed him of results. He has a noted allergy on his chart to tamsulosin. He is unsure what happens when he takes this he is unaware of it as allergy. Will check to make sure Dr. Agustin Cree still wants him to take lasix since a warning is coming up due to his noted allergy to tamsulosin.

## 2021-02-07 NOTE — Telephone Encounter (Signed)
Casey Greene is calling back due to not receiving a callback with Dr. Wendy Poet recommendation on medication. I advised Casey Greene we are still waiting on a response from him and assured her we will be contacting her as soon as they do. She states if no one answers when calling back to please leave a VM. Please advise.

## 2021-02-07 NOTE — Telephone Encounter (Signed)
The problem is that I was hoping to be able to increase dose of diuretic, however he is kidney infection which deteriorated somewhat I am afraid that something option.  That may need to be directed by nephrologist.

## 2021-02-07 NOTE — Telephone Encounter (Signed)
Called patient informed him that Dr. Agustin Cree feels it is best for a kidney doctor to address given his kidney function. Patient informed he states he already has a kidney doctor and will reach out to them. He will let us know if he needs anything further.

## 2021-02-10 ENCOUNTER — Ambulatory Visit (INDEPENDENT_AMBULATORY_CARE_PROVIDER_SITE_OTHER): Payer: Medicare Other | Admitting: Family Medicine

## 2021-02-10 ENCOUNTER — Encounter: Payer: Self-pay | Admitting: Family Medicine

## 2021-02-10 ENCOUNTER — Other Ambulatory Visit: Payer: Self-pay

## 2021-02-10 VITALS — BP 142/80 | HR 62 | Temp 97.8°F | Ht 70.0 in | Wt 177.6 lb

## 2021-02-10 DIAGNOSIS — I10 Essential (primary) hypertension: Secondary | ICD-10-CM | POA: Diagnosis not present

## 2021-02-10 DIAGNOSIS — H6122 Impacted cerumen, left ear: Secondary | ICD-10-CM

## 2021-02-10 MED ORDER — CARBAMIDE PEROXIDE 6.5 % OT SOLN
5.0000 [drp] | Freq: Two times a day (BID) | OTIC | 0 refills | Status: DC
Start: 2021-02-10 — End: 2021-04-03

## 2021-02-10 NOTE — Progress Notes (Signed)
Established Patient Office Visit  Subjective:  Patient ID: Casey Greene, male    DOB: [birth year]  Age: 70 y.o. MRN: [id number]  CC:  Chief Complaint  Patient presents with  . Follow-up    Follow up on BP, concerns about fluid build up around heart would like medications to help with this swollen ankles. Would like left ear checked, would like left knee checked. Not sure if patient should go back to work with illnesses, lost job in November 2021.     HPI Casey Greene presents for follow-up of hypertension and left ear congestion.  Blood pressure improved with increase in hydralazine to 100 mg 3 times daily.  B natriuretic peptide elevated at cardiology.  History of LVH with normal ventricular function at 55% ef.  Patient pends swelling in his legs.  Ongoing history of DOE.  Wife tells me that nephrology will be directing therapy Lasix.  Past Medical History:  Diagnosis Date  . Anemia of chronic disease 03/28/2018  . Ascending aorta dilation 07/29/2019   (a) Echo 07/29/19 93mm  . Bleeding per rectum 12/09/2018  . CKD (chronic kidney disease) stage 3, GFR 30-59 ml/min (HCC) 10/13/2014   Discussed with patient importance of good BP control, avoiding nephrotoxic meds/supplements in preventing further progression  . Constipation 12/01/2018  . Corn or callus 06/11/2019  . Decreased appetite   . Diastolic congestive heart failure ([place]) 02/03/2021  . DOE (dyspnea on exertion) 06/17/2020  . Dysrhythmia   . Edema 07/09/2016  . Elevated hemoglobin A1c 06/11/2019  . Essential hypertension 06/09/2014   Overview:  metoprolol caused bradycardia amlodipine causes headache, leg swelling  Last Assessment & Plan:  We had a long discussion regarding goals of treatment of HTN to prevent worsening cardiac and renal disease.  We also discussed options to change his regimen.  He states that he would prefer to seek a practitioner who can recommend herbal remedies as he feels thhis will be a healthier path f   . Hepatitis C virus infection cured after antiviral drug therapy 07/05/2014  . History of gout   . History of urinary frequency 12/05/2020  . Hyperlipidemia 06/29/2014  . Hypokalemia 12/09/2018  . Laryngeal mass 06/17/2020  . Localized osteoarthritis of left knee 06/11/2020   Left knee injection-06/10/2020  . LVH (left ventricular hypertrophy) due to hypertensive disease    (a) echo 07/29/19 severe concentric LVH  . Major depressive disorder 02/27/2016  . Mass of thyroid region 03/28/2018  . Mild neurocognitive disorder due to multiple etiologies 12/30/2020  . Nonsustained ventricular tachycardia 07/23/2019   (a) Zio monitor 06/2019. Subsequent echo with severe concentric LVH, but normal EF 55-60%  . Onychomycosis 12/01/2013  . PAC (premature atrial contraction)    (a) ZIO 06/2019. Asymptomatic  . Prostate cancer 1997   Dx in 1997, never treated, taking herbal supplements. S/p prostatectomy 2017  . PVD (peripheral vascular disease) 12/01/2013  . Stroke    Per 12/26/20 MRI - remote pontine and bilateral thalamic infarcts   . Urinary retention     Past Surgical History:  Procedure Laterality Date  . ACHILLES TENDON REPAIR    . CERVICAL DISC SURGERY     neck  . COLONOSCOPY     before 2010   . LYMPHADENECTOMY Bilateral 01/26/2016   Procedure: EXTENDED BILATERAL PELVIC LYMPHADENECTOMY;  Surgeon: Raynelle Bring, MD;  Location: [facility];  Service: Urology;  Laterality: Bilateral;  . NOSE SURGERY     in high school-nasal fracturex2  . ROBOT ASSISTED LAPAROSCOPIC RADICAL  PROSTATECTOMY N/A 01/26/2016   Procedure: XI ROBOTIC ASSISTED LAPAROSCOPIC RADICAL PROSTATECTOMY LEVEL 3;  Surgeon: Raynelle Bring, MD;  Location: [facility];  Service: Urology;  Laterality: N/A;    Family History  Problem Relation Age of Onset  . Heart disease Mother   . Obesity Mother   . Cervical cancer Mother   . Dementia Mother        unspecified type; symptom onset in late 60s/early 65s  . Heart disease Father   . Diabetes  Father   . Stroke Father   . ALS Sister   . Stroke Paternal Grandmother   . Stroke Paternal Grandfather   . Colon cancer Neg Hx   . Esophageal cancer Neg Hx   . Colon polyps Neg Hx   . Rectal cancer Neg Hx   . Stomach cancer Neg Hx     Social History   Socioeconomic History  . Marital status: Married    Spouse name: Not on file  . Number of children: Not on file  . Years of education: 103  . Highest education level: Some college, no degree  Occupational History  . Occupation: Unemployed  Tobacco Use  . Smoking status: Former Smoker    Packs/day: 0.50    Years: 10.00    Pack years: 5.00    Types: Cigarettes    Quit date: 01/11/2006    Years since quitting: 15.0  . Smokeless tobacco: Never Used  Vaping Use  . Vaping Use: Never used  Substance and Sexual Activity  . Alcohol use: No  . Drug use: Not Currently    Types: Cocaine, Marijuana    Comment: hx of cocaine abuse; stopped approximately 12 years prior  . Sexual activity: Not Currently  Other Topics Concern  . Not on file  Social History Narrative  . Not on file   Social Determinants of Health   Financial Resource Strain: Low Risk   . Difficulty of Paying Living Expenses: Not hard at all  Food Insecurity: Not on file  Transportation Needs: No Transportation Needs  . Lack of Transportation (Medical): No  . Lack of Transportation (Non-Medical): No  Physical Activity: Not on file  Stress: Not on file  Social Connections: Not on file  Intimate Partner Violence: Not on file    Outpatient Medications Prior to Visit  Medication Sig Dispense Refill  . aspirin EC 81 MG tablet Take 81 mg by mouth daily. Swallow whole.    . carvedilol (COREG) 6.25 MG tablet TAKE 1 TABLET TWICE DAILY WITH MEALS 90 tablet 0  . ferrous sulfate 325 (65 FE) MG tablet Take 325 mg by mouth daily with breakfast.    . hydrALAZINE (APRESOLINE) 100 MG tablet Take 1 tablet (100 mg total) by mouth 3 (three) times daily. 90 tablet 3  . losartan  (COZAAR) 50 MG tablet Take 1 tablet (50 mg total) by mouth 2 (two) times daily.    Marland Kitchen oxybutynin (DITROPAN) 5 MG tablet Take 5 mg by mouth at bedtime.    . vitamin C (ASCORBIC ACID) 500 MG tablet     . cinacalcet (SENSIPAR) 30 MG tablet Take 30 mg by mouth daily.    . furosemide (LASIX) 40 MG tablet Take 40 mg by mouth daily. (Patient not taking: Reported on 02/10/2021)    . Multiple Vitamin (MULTIVITAMIN ADULT PO) Take by mouth.     No facility-administered medications prior to visit.    Allergies  Allergen Reactions  . Amlodipine Other (See Comments)    headache  .  Tamsulosin Hcl     ROS Review of Systems  Constitutional: Negative.   HENT: Positive for hearing loss. Negative for ear discharge and ear pain.   Eyes: Negative for photophobia and visual disturbance.  Respiratory: Negative for chest tightness and shortness of breath.   Cardiovascular: Positive for leg swelling. Negative for chest pain and palpitations.  Gastrointestinal: Negative.   Musculoskeletal: Positive for joint swelling. Negative for gait problem.  Skin: Negative for color change and pallor.  Psychiatric/Behavioral: Negative.       Objective:    Physical Exam Constitutional:      General: He is not in acute distress.    Appearance: Normal appearance. He is not ill-appearing, toxic-appearing or diaphoretic.  HENT:     Head: Normocephalic and atraumatic.     Right Ear: Tympanic membrane, ear canal and external ear normal.     Left Ear: There is impacted cerumen.     Mouth/Throat:     Mouth: Mucous membranes are moist.     Pharynx: Oropharynx is clear. No oropharyngeal exudate or posterior oropharyngeal erythema.  Eyes:     General: No scleral icterus.    Extraocular Movements: Extraocular movements intact.     Conjunctiva/sclera: Conjunctivae normal.     Pupils: Pupils are equal, round, and reactive to light.  Cardiovascular:     Rate and Rhythm: Normal rate and regular rhythm.  Pulmonary:      Effort: Pulmonary effort is normal.     Breath sounds: Normal breath sounds. No wheezing or rhonchi.  Abdominal:     General: Bowel sounds are normal.  Musculoskeletal:     Cervical back: Normal range of motion and neck supple.     Right lower leg: Edema present.     Left lower leg: Edema present.  Skin:    General: Skin is warm and dry.  Neurological:     Mental Status: He is alert and oriented to person, place, and time.  Psychiatric:        Mood and Affect: Mood normal.        Behavior: Behavior normal.    Subjective:    Casey Greene is a 70 y.o. male whom I am asked to see for evaluation of diminished hearing in the left ear for the past 2 months. There is a prior history of cerumen impaction. The patient has not been using ear drops to loosen wax immediately prior to this visit. The patient denies ear pain.  The patient's history has been marked as reviewed and updated as appropriate.  Review of Systems Pertinent items are noted in HPI.    Objective:    Auditory canal(s) of the left ear are completely obstructed with cerumen.   Cerumen was removed using gentle irrigation. Tympanic membranes are intact following the procedure.  Auditory canals are normal.    Assessment:    Cerumen Impaction without otitis externa.    Plan:    1. Care instructions given. 2. Home treatment: ceruminex. 3. Follow-up as needed.  BP (!) 142/80   Pulse 62   Temp 97.8 F (36.6 C) (Temporal)   Ht 5\' 10"  (1.778 m)   Wt 177 lb 9.6 oz (80.6 kg)   SpO2 99%   BMI 25.48 kg/m  Wt Readings from Last 3 Encounters:  02/10/21 177 lb 9.6 oz (80.6 kg)  02/03/21 173 lb (78.5 kg)  12/27/20 181 lb 3.2 oz (82.2 kg)     Health Maintenance Due  Topic Date Due  . TETANUS/TDAP  Never  done  . INFLUENZA VACCINE  Never done    There are no preventive care reminders to display for this patient.  Lab Results  Component Value Date   TSH 2.72 03/28/2018   Lab Results  Component Value Date   WBC  6.7 12/26/2020   HGB 12.8 (L) 12/26/2020   HCT 40.6 12/26/2020   MCV 99.0 12/26/2020   PLT 148 (L) 12/26/2020   Lab Results  Component Value Date   NA 146 (H) 02/03/2021   K 4.1 02/03/2021   CO2 24 02/03/2021   GLUCOSE 94 02/03/2021   BUN 42 (H) 02/03/2021   CREATININE 2.42 (H) 02/03/2021   BILITOT 0.8 12/26/2020   ALKPHOS 53 12/26/2020   AST 18 12/26/2020   ALT 10 12/26/2020   PROT 8.2 (H) 12/26/2020   ALBUMIN 4.6 12/26/2020   CALCIUM 10.6 (H) 02/03/2021   ANIONGAP 11 12/26/2020   GFR 30.42 (L) 10/27/2019   Lab Results  Component Value Date   CHOL 190 03/28/2018   Lab Results  Component Value Date   HDL 61.90 03/28/2018   Lab Results  Component Value Date   LDLCALC 116 (H) 03/28/2018   Lab Results  Component Value Date   TRIG 61.0 03/28/2018   Lab Results  Component Value Date   CHOLHDL 3 03/28/2018   Lab Results  Component Value Date   HGBA1C 5.8 06/11/2019      Assessment & Plan:   Problem List Items Addressed This Visit      Cardiovascular and Mediastinum   Essential hypertension   Relevant Medications   furosemide (LASIX) 40 MG tablet     Nervous and Auditory   Ceruminosis, left - Primary   Relevant Medications   carbamide peroxide (DEBROX) 6.5 % OTIC solution   Other Relevant Orders   Ear Lavage      Meds ordered this encounter  Medications  . carbamide peroxide (DEBROX) 6.5 % OTIC solution    Sig: Place 5 drops into the left ear 2 (two) times daily.    Dispense:  15 mL    Refill:  0    Follow-up: Return in about 2 weeks (around 02/24/2021), or use debrox and follow up in 2 weeks..  Please use Cerumenex regularly to help keep ears clean.  Follow-up with nephrology for Lasix dosing.  Libby Maw, MD

## 2021-02-10 NOTE — Patient Instructions (Signed)

## 2021-02-17 DIAGNOSIS — D631 Anemia in chronic kidney disease: Secondary | ICD-10-CM | POA: Diagnosis not present

## 2021-02-17 DIAGNOSIS — D351 Benign neoplasm of parathyroid gland: Secondary | ICD-10-CM | POA: Diagnosis not present

## 2021-02-17 DIAGNOSIS — N183 Chronic kidney disease, stage 3 unspecified: Secondary | ICD-10-CM | POA: Diagnosis not present

## 2021-02-17 DIAGNOSIS — I129 Hypertensive chronic kidney disease with stage 1 through stage 4 chronic kidney disease, or unspecified chronic kidney disease: Secondary | ICD-10-CM | POA: Diagnosis not present

## 2021-02-24 ENCOUNTER — Telehealth: Payer: Self-pay

## 2021-02-24 ENCOUNTER — Telehealth: Payer: Self-pay | Admitting: Family Medicine

## 2021-02-24 NOTE — Telephone Encounter (Signed)
Patients wife called stating that patient would like to do a TOC to Dr. Gena Fray. Called patient to schedule, but no answer. Okay to schedule.

## 2021-02-24 NOTE — Progress Notes (Signed)
Chronic Care Management Pharmacy Assistant   Name: Casey Greene  MRN: [id number] DOB: 1951/07/05  Reason for Encounter:Hypertension Disease State Call.   Recent office visits:  02/10/2021 PCP Abelino Derrick started   carbamide peroxide (DEBROX) 6.5 % OTIC solution    Sig: Place 5 drops into the left ear 2 (two) times daily.     Recent consult visits:  02/03/2021 Cardiology [facility] visits:  None in previous 6 months  Medications: Outpatient Encounter Medications as of 02/24/2021  Medication Sig Note  . aspirin EC 81 MG tablet Take 81 mg by mouth daily. Swallow whole.   . carbamide peroxide (DEBROX) 6.5 % OTIC solution Place 5 drops into the left ear 2 (two) times daily.   . carvedilol (COREG) 6.25 MG tablet TAKE 1 TABLET TWICE DAILY WITH MEALS   . ferrous sulfate 325 (65 FE) MG tablet Take 325 mg by mouth daily with breakfast.   . hydrALAZINE (APRESOLINE) 100 MG tablet Take 1 tablet (100 mg total) by mouth 3 (three) times daily.   Marland Kitchen losartan (COZAAR) 50 MG tablet Take 1 tablet (50 mg total) by mouth 2 (two) times daily.   Marland Kitchen oxybutynin (DITROPAN) 5 MG tablet Take 5 mg by mouth at bedtime. 11/28/2020: Prescribed by Dr. Junious Silk  . vitamin C (ASCORBIC ACID) 500 MG tablet     No facility-administered encounter medications on file as of 02/24/2021.    Star Rating Drugs:losartan 50 mg Dispensed on 10/11/2020 from [facility] for 90 Day Supply  Reviewed chart prior to disease state call. Spoke with patient regarding BP  Recent Office Vitals: BP Readings from Last 3 Encounters:  02/10/21 (!) 142/80  02/03/21 110/72  12/27/20 118/68   Pulse Readings from Last 3 Encounters:  02/10/21 62  02/03/21 73  12/27/20 81    Wt Readings from Last 3 Encounters:  02/10/21 177 lb 9.6 oz (80.6 kg)  02/03/21 173 lb (78.5 kg)  12/27/20 181 lb 3.2 oz (82.2 kg)     Kidney Function Lab Results  Component Value Date/Time   CREATININE 2.42 (H) 02/03/2021 11:00 AM    CREATININE 2.26 (H) 12/26/2020 12:59 PM   CREATININE 2.66 (H) 06/17/2020 03:35 PM   CREATININE 2.29 (H) 06/10/2018 03:47 PM   GFR 30.42 (L) 10/27/2019 01:28 PM   GFRNONAA 30 (L) 12/26/2020 12:59 PM   GFRNONAA 24 (L) 05/01/2018 02:54 PM   GFRAA 31 04/13/2020 12:00 AM   GFRAA 28 (L) 05/01/2018 02:54 PM    BMP Latest Ref Rng & Units 02/03/2021 12/26/2020 10/09/2020  Glucose 65 - 99 mg/dL 94 114(H) 111(H)  BUN 8 - 27 mg/dL 42(H) 42(H) 39(H)  Creatinine 0.76 - 1.27 mg/dL 2.42(H) 2.26(H) 2.37(H)  BUN/Creat Ratio 10 - 24 17 - -  Sodium 134 - 144 mmol/L 146(H) 141 138  Potassium 3.5 - 5.2 mmol/L 4.1 3.6 3.6  Chloride 96 - 106 mmol/L 105 105 105  CO2 20 - 29 mmol/L 24 25 27   Calcium 8.6 - 10.2 mg/dL 10.6(H) 10.3 10.1    . Current antihypertensive regimen:   Carvedilol 6.25 mg twice daily   Hydralazine 100 mg three times daily   Losartan 50 mg twice daily  . How often are you checking your Blood Pressure? Patient states he needs to purchase a new blood pressure cuff . Marland Kitchen Current home BP readings: None ID . What recent interventions/DTPs have been made by any provider to improve Blood Pressure control since last CPP Visit: None ID .  Any recent hospitalizations or ED visits since last visit with CPP? No . What diet changes have been made to improve Blood Pressure Control?  o Patient states he likes tomatoes,bananas,cantaloupe,and all kinds of vegetables. . What exercise is being done to improve your Blood Pressure Control?  o Patient reports he does not exercise.   Adherence Review: Is the patient currently on ACE/ARB medication? Yes Does the patient have >5 day gap between last estimated fill dates? Yes   [place] Pharmacist Assistant [phone]

## 2021-02-27 DIAGNOSIS — N183 Chronic kidney disease, stage 3 unspecified: Secondary | ICD-10-CM | POA: Diagnosis not present

## 2021-02-28 ENCOUNTER — Ambulatory Visit (INDEPENDENT_AMBULATORY_CARE_PROVIDER_SITE_OTHER): Payer: Medicare Other

## 2021-02-28 VITALS — Ht 70.0 in | Wt 177.0 lb

## 2021-02-28 DIAGNOSIS — Z0000 Encounter for general adult medical examination without abnormal findings: Secondary | ICD-10-CM | POA: Diagnosis not present

## 2021-02-28 NOTE — Progress Notes (Signed)
Subjective:   Casey Greene is a 70 y.o. male who presents for an Initial Medicare Annual Wellness Visit.  I connected with Casey Greene today by telephone and verified that I am speaking with the correct person using two identifiers. Location patient: home Location provider: work Persons participating in the virtual visit: patient, Marine scientist.    I discussed the limitations, risks, security and privacy concerns of performing an evaluation and management service by telephone and the availability of in person appointments. I also discussed with the patient that there may be a patient responsible charge related to this service. The patient expressed understanding and verbally consented to this telephonic visit.    Interactive audio and video telecommunications were attempted between this provider and patient, however failed, due to patient having technical difficulties OR patient did not have access to video capability.  We continued and completed visit with audio only.  Some vital signs may be absent or patient reported.   Time Spent with patient on telephone encounter: 25 minutes   Review of Systems     Cardiac Risk Factors include: advanced age (>44men, >25 women);dyslipidemia;hypertension     Objective:    Today's Vitals   02/28/21 0946 02/28/21 0947  Weight: 177 lb (80.3 kg)   Height: 5\' 10"  (1.778 m)   PainSc:  5    Body mass index is 25.4 kg/m.  Advanced Directives 02/28/2021 10/07/2020 10/07/2020 01/15/2019 07/03/2018 09/08/2016 09/07/2016  Does Patient Have a Medical Advance Directive? No No No Yes Yes No No  Type of Advance Directive - - - Living will [place];Living will - -  Does patient want to make changes to medical advance directive? Yes (MAU/Ambulatory/Procedural Areas - Information given) - - - - - -  Would patient like information on creating a medical advance directive? - No - Patient declined - - - No - patient declined information No - patient declined  information    Current Medications (verified) Outpatient Encounter Medications as of 02/28/2021  Medication Sig  . aspirin EC 81 MG tablet Take 81 mg by mouth daily. Swallow whole.  . carbamide peroxide (DEBROX) 6.5 % OTIC solution Place 5 drops into the left ear 2 (two) times daily.  . carvedilol (COREG) 6.25 MG tablet TAKE 1 TABLET TWICE DAILY WITH MEALS  . ferrous sulfate 325 (65 FE) MG tablet Take 325 mg by mouth daily with breakfast.  . hydrALAZINE (APRESOLINE) 100 MG tablet Take 1 tablet (100 mg total) by mouth 3 (three) times daily.  Marland Kitchen losartan (COZAAR) 50 MG tablet Take 1 tablet (50 mg total) by mouth 2 (two) times daily.  Marland Kitchen oxybutynin (DITROPAN) 5 MG tablet Take 5 mg by mouth at bedtime.  . vitamin C (ASCORBIC ACID) 500 MG tablet    No facility-administered encounter medications on file as of 02/28/2021.    Allergies (verified) Amlodipine and Tamsulosin hcl   History: Past Medical History:  Diagnosis Date  . Anemia of chronic disease 03/28/2018  . Ascending aorta dilation 07/29/2019   (a) Echo 07/29/19 23mm  . Bleeding per rectum 12/09/2018  . CKD (chronic kidney disease) stage 3, GFR 30-59 ml/min (HCC) 10/13/2014   Discussed with patient importance of good BP control, avoiding nephrotoxic meds/supplements in preventing further progression  . Constipation 12/01/2018  . Corn or callus 06/11/2019  . Decreased appetite   . Diastolic congestive heart failure ([place]) 02/03/2021  . DOE (dyspnea on exertion) 06/17/2020  . Dysrhythmia   . Edema 07/09/2016  . Elevated hemoglobin A1c  06/11/2019  . Essential hypertension 06/09/2014   Overview:  metoprolol caused bradycardia amlodipine causes headache, leg swelling  Last Assessment & Plan:  We had a long discussion regarding goals of treatment of HTN to prevent worsening cardiac and renal disease.  We also discussed options to change his regimen.  He states that he would prefer to seek a practitioner who can recommend herbal remedies as he  feels thhis will be a healthier path f  . Hepatitis C virus infection cured after antiviral drug therapy 07/05/2014  . History of gout   . History of urinary frequency 12/05/2020  . Hyperlipidemia 06/29/2014  . Hypokalemia 12/09/2018  . Laryngeal mass 06/17/2020  . Localized osteoarthritis of left knee 06/11/2020   Left knee injection-06/10/2020  . LVH (left ventricular hypertrophy) due to hypertensive disease    (a) echo 07/29/19 severe concentric LVH  . Major depressive disorder 02/27/2016  . Mass of thyroid region 03/28/2018  . Mild neurocognitive disorder due to multiple etiologies 12/30/2020  . Nonsustained ventricular tachycardia 07/23/2019   (a) Zio monitor 06/2019. Subsequent echo with severe concentric LVH, but normal EF 55-60%  . Onychomycosis 12/01/2013  . PAC (premature atrial contraction)    (a) ZIO 06/2019. Asymptomatic  . Prostate cancer 1997   Dx in 1997, never treated, taking herbal supplements. S/p prostatectomy 2017  . PVD (peripheral vascular disease) 12/01/2013  . Stroke    Per 12/26/20 MRI - remote pontine and bilateral thalamic infarcts   . Urinary retention    Past Surgical History:  Procedure Laterality Date  . ACHILLES TENDON REPAIR    . CERVICAL DISC SURGERY     neck  . COLONOSCOPY     before 2010   . LYMPHADENECTOMY Bilateral 01/26/2016   Procedure: EXTENDED BILATERAL PELVIC LYMPHADENECTOMY;  Surgeon: Raynelle Bring, MD;  Location: [facility];  Service: Urology;  Laterality: Bilateral;  . NOSE SURGERY     in high school-nasal fracturex2  . ROBOT ASSISTED LAPAROSCOPIC RADICAL PROSTATECTOMY N/A 01/26/2016   Procedure: XI ROBOTIC ASSISTED LAPAROSCOPIC RADICAL PROSTATECTOMY LEVEL 3;  Surgeon: Raynelle Bring, MD;  Location: [facility];  Service: Urology;  Laterality: N/A;   Family History  Problem Relation Age of Onset  . Heart disease Mother   . Obesity Mother   . Cervical cancer Mother   . Dementia Mother        unspecified type; symptom onset in late 60s/early 97s  .  Heart disease Father   . Diabetes Father   . Stroke Father   . ALS Sister   . Stroke Paternal Grandmother   . Stroke Paternal Grandfather   . Colon cancer Neg Hx   . Esophageal cancer Neg Hx   . Colon polyps Neg Hx   . Rectal cancer Neg Hx   . Stomach cancer Neg Hx    Social History   Socioeconomic History  . Marital status: Married    Spouse name: Not on file  . Number of children: Not on file  . Years of education: 23  . Highest education level: Some college, no degree  Occupational History  . Occupation: retired  Tobacco Use  . Smoking status: Former Smoker    Packs/day: 0.50    Years: 10.00    Pack years: 5.00    Types: Cigarettes    Quit date: 01/11/2006    Years since quitting: 15.1  . Smokeless tobacco: Never Used  Vaping Use  . Vaping Use: Never used  Substance and Sexual Activity  . Alcohol use:  No  . Drug use: Not Currently    Types: Cocaine, Marijuana    Comment: hx of cocaine abuse; stopped approximately 12 years prior  . Sexual activity: Not Currently  Other Topics Concern  . Not on file  Social History Narrative  . Not on file   Social Determinants of Health   Financial Resource Strain: Low Risk   . Difficulty of Paying Living Expenses: Not hard at all  Food Insecurity: No Food Insecurity  . Worried About Charity fundraiser in the Last Year: Never true  . Ran Out of Food in the Last Year: Never true  Transportation Needs: No Transportation Needs  . Lack of Transportation (Medical): No  . Lack of Transportation (Non-Medical): No  Physical Activity: Insufficiently Active  . Days of Exercise per Week: 7 days  . Minutes of Exercise per Session: 20 min  Stress: No Stress Concern Present  . Feeling of Stress : Not at all  Social Connections: Moderately Integrated  . Frequency of Communication with Friends and Family: More than three times a week  . Frequency of Social Gatherings with Friends and Family: Once a week  . Attends Religious Services: 1  to 4 times per year  . Active Member of Clubs or Organizations: No  . Attends Archivist Meetings: Never  . Marital Status: Married    Tobacco Counseling Counseling given: Not Answered   Clinical Intake:  Pre-visit preparation completed: Yes  Pain : 0-10 Pain Score: 5  Pain Type: Chronic pain Pain Location: Knee Pain Orientation: Left Pain Onset: More than a month ago Pain Frequency: Intermittent     Nutritional Status: BMI 25 -29 Overweight Nutritional Risks: None Diabetes: No  How often do you need to have someone help you when you read instructions, pamphlets, or other written materials from your doctor or pharmacy?: 1 - Never  Diabetic?No  Interpreter Needed?: No  Information entered by :: Caroleen Hamman LPN   Activities of Daily Living In your present state of health, do you have any difficulty performing the following activities: 02/28/2021 10/07/2020  Hearing? N N  Vision? N N  Difficulty concentrating or making decisions? Y Y  Comment occasionally forgets names slight  Walking or climbing stairs? N Y  Dressing or bathing? N N  Doing errands, shopping? N N  Preparing Food and eating ? N -  Using the Toilet? N -  In the past six months, have you accidently leaked urine? N -  Do you have problems with loss of bowel control? N -  Managing your Medications? N -  Managing your Finances? N -  Housekeeping or managing your Housekeeping? N -  Some recent data might be hidden    Patient Care Team: Libby Maw, MD as PCP - General (Family Medicine) Park Liter, MD as PCP - Cardiology (Cardiology) Germaine Pomfret, [facility] as Pharmacist (Pharmacist) Festus Aloe, MD as Consulting Physician (Urology) Rosita Fire, MD as Consulting Physician (Nephrology)  Indicate any recent Medical Services you may have received from other than [facility] providers in the past year (date may be approximate).     Assessment:   This is a  routine wellness examination for [place].  Hearing/Vision screen  Hearing Screening   125Hz  250Hz  500Hz  1000Hz  2000Hz  3000Hz  4000Hz  6000Hz  8000Hz   Right ear:           Left ear:           Comments: No issues  Vision Screening Comments:  Wears glasses Last eye exam-2020-[facility]  Dietary issues and exercise activities discussed: Current Exercise Habits: Home exercise routine, Type of exercise: stretching, Time (Minutes): 20, Frequency (Times/Week): 7, Weekly Exercise (Minutes/Week): 140, Exercise limited by: orthopedic condition(s)  Goals    . Patient Stated     Increase activity, drink more water & eat healthier    . Track and Manage My Blood Pressure-Hypertension     Timeframe:  Long-Range Goal Priority:  High Start Date:  01/24/2021                           Expected End Date: 07/27/2021                      Follow Up Date 03/25/2021    - purchase a new blood pressure monitor  - check blood pressure 3 times per week - write blood pressure results in a log or diary    Why is this important?    You won't feel high blood pressure, but it can still hurt your blood vessels.   High blood pressure can cause heart or kidney problems. It can also cause a stroke.   Making lifestyle changes like losing a little weight or eating less salt will help.   Checking your blood pressure at home and at different times of the day can help to control blood pressure.   If the doctor prescribes medicine remember to take it the way the doctor ordered.   Call the office if you cannot afford the medicine or if there are questions about it.     Notes:       Depression Screen PHQ 2/9 Scores 02/28/2021 02/10/2021 12/27/2020 12/03/2019 01/05/2019 10/08/2018 05/01/2018  PHQ - 2 Score 0 0 0 0 2 0 0  PHQ- 9 Score - - - - 2 - -    Fall Risk Fall Risk  02/28/2021 02/10/2021 12/27/2020 12/03/2019 01/05/2019  Falls in the past year? 0 0 0 0 0  Number falls in past yr: 0 - - - 0  Injury with Fall? 0 - - - 0  Follow up  Falls prevention discussed - - - -    FALL RISK PREVENTION PERTAINING TO THE HOME:  Any stairs in or around the home? No  Home free of loose throw rugs in walkways, pet beds, electrical cords, etc? Yes  Adequate lighting in your home to reduce risk of falls? Yes   ASSISTIVE DEVICES UTILIZED TO PREVENT FALLS:  Life alert? No  Use of a cane, walker or w/c? Yes  Grab bars in the bathroom? Yes  Shower chair or bench in shower? No  Elevated toilet seat or a handicapped toilet? No   TIMED UP AND GO:  Was the test performed? No . Phone visit   Cognitive Function:Normal cognitive status assessed by this Nurse Health Advisor. No abnormalities found.       6CIT Screen 02/28/2021  What Year? 0 points  What month? 0 points  What time? 0 points  Count back from 20 0 points  Months in reverse 4 points  Repeat phrase 0 points  Total Score 4    Immunizations Immunization History  Administered Date(s) Administered  . Hepatitis A, Adult 05/01/2018, 01/05/2019  . Hepatitis B, adult 05/01/2018, 06/10/2018, 01/05/2019  . Hepb-cpg 12/05/2020  . Janssen (J&J) SARS-COV-2 Vaccination 02/29/2020, 11/11/2020  . Pneumococcal Polysaccharide-23 10/08/2020    TDAP status: Due, Education has been provided regarding the  importance of this vaccine. Advised may receive this vaccine at local pharmacy or Health Dept. Aware to provide a copy of the vaccination record if obtained from local pharmacy or Health Dept. Verbalized acceptance and understanding.  Flu Vaccine status: Due, Education has been provided regarding the importance of this vaccine. Advised may receive this vaccine at local pharmacy or Health Dept. Aware to provide a copy of the vaccination record if obtained from local pharmacy or Health Dept. Verbalized acceptance and understanding.  Pneumococcal vaccine status: Up to date  Covid-19 vaccine status: Completed vaccines  Qualifies for Shingles Vaccine? Yes   Zostavax completed No    Shingrix Completed?: No.    Education has been provided regarding the importance of this vaccine. Patient has been advised to call insurance company to determine out of pocket expense if they have not yet received this vaccine. Advised may also receive vaccine at local pharmacy or Health Dept. Verbalized acceptance and understanding.  Screening Tests Health Maintenance  Topic Date Due  . TETANUS/TDAP  Never done  . INFLUENZA VACCINE  06/26/2021  . PNA vac Low Risk Adult (2 of 2 - PCV13) 10/08/2021  . COLONOSCOPY (Pts 45-80yrs Insurance coverage will need to be confirmed)  01/27/2029  . COVID-19 Vaccine  Completed  . Hepatitis C Screening  Completed  . HPV VACCINES  Aged Out    Health Maintenance  Health Maintenance Due  Topic Date Due  . TETANUS/TDAP  Never done    Colorectal cancer screening: Type of screening: Colonoscopy. Completed 01/28/2019. Repeat every 10 years  Lung Cancer Screening: (Low Dose CT Chest recommended if Age 10-80 years, 30 pack-year currently smoking OR have quit w/in 15years.) does not qualify.    Additional Screening:  Hepatitis C Screening:Completed 12/08/2020  Vision Screening: Recommended annual ophthalmology exams for early detection of glaucoma and other disorders of the eye. Is the patient up to date with their annual eye exam?  No  Who is the provider or what is the name of the office in which the patient attends annual eye exams? [place] Patient advised to make an appt soon  Dental Screening: Recommended annual dental exams for proper oral hygiene  Community Resource Referral / Chronic Care Management: CRR required this visit?  No   CCM required this visit?  No      Plan:     I have personally reviewed and noted the following in the patient's chart:   . Medical and social history . Use of alcohol, tobacco or illicit drugs  . Current medications and supplements . Functional ability and status . Nutritional status . Physical  activity . Advanced directives . List of other physicians . Hospitalizations, surgeries, and ER visits in previous 12 months . Vitals . Screenings to include cognitive, depression, and falls . Referrals and appointments  In addition, I have reviewed and discussed with patient certain preventive protocols, quality metrics, and best practice recommendations. A written personalized care plan for preventive services as well as general preventive health recommendations were provided to patient.   Due to this being a telephonic visit, the after visit summary with patients personalized plan was offered to patient via mail or my-chart.  Patient would like to access on my-chart.    Marta Antu, LPN   12/30/4626  Nurse Health Advisor  Nurse Notes: None

## 2021-02-28 NOTE — Patient Instructions (Signed)
[name] , Thank you for taking time to complete your Medicare Wellness Visit. I appreciate your ongoing commitment to your health goals. Please review the following plan we discussed and let me know if I can assist you in the future.   Screening recommendations/referrals: Colonoscopy: Completed 01/28/2019-Not required after age 72 Recommended yearly ophthalmology/optometry visit for glaucoma screening and checkup Recommended yearly dental visit for hygiene and checkup  Vaccinations: Influenza vaccine: Declined Pneumococcal vaccine: Up to date Tdap vaccine: Discuss with pharmacy Shingles vaccine: Discuss with pharmacy  Covid-19: Completed vaccines  Advanced directives: Information mailed today  Conditions/risks identified: See problem list  Next appointment: Follow up in one year for your annual wellness visit. 03/06/2022 @ 9:45  Preventive Care 72 Years and Older, Male Preventive care refers to lifestyle choices and visits with your health care provider that can promote health and wellness. What does preventive care include?  A yearly physical exam. This is also called an annual well check.  Dental exams once or twice a year.  Routine eye exams. Ask your health care provider how often you should have your eyes checked.  Personal lifestyle choices, including:  Daily care of your teeth and gums.  Regular physical activity.  Eating a healthy diet.  Avoiding tobacco and drug use.  Limiting alcohol use.  Practicing safe sex.  Taking low doses of aspirin every day.  Taking vitamin and mineral supplements as recommended by your health care provider. What happens during an annual well check? The services and screenings done by your health care provider during your annual well check will depend on your age, overall health, lifestyle risk factors, and family history of disease. Counseling  Your health care provider may ask you questions about your:  Alcohol use.  Tobacco  use.  Drug use.  Emotional well-being.  Home and relationship well-being.  Sexual activity.  Eating habits.  History of falls.  Memory and ability to understand (cognition).  Work and work Statistician. Screening  You may have the following tests or measurements:  Height, weight, and BMI.  Blood pressure.  Lipid and cholesterol levels. These may be checked every 5 years, or more frequently if you are over 44 years old.  Skin check.  Lung cancer screening. You may have this screening every year starting at age 72 if you have a 30-pack-year history of smoking and currently smoke or have quit within the past 15 years.  Fecal occult blood test (FOBT) of the stool. You may have this test every year starting at age 72.  Flexible sigmoidoscopy or colonoscopy. You may have a sigmoidoscopy every 5 years or a colonoscopy every 10 years starting at age 72.  Prostate cancer screening. Recommendations will vary depending on your family history and other risks.  Hepatitis C blood test.  Hepatitis B blood test.  Sexually transmitted disease (STD) testing.  Diabetes screening. This is done by checking your blood sugar (glucose) after you have not eaten for a while (fasting). You may have this done every 1-3 years.  Abdominal aortic aneurysm (AAA) screening. You may need this if you are a current or former smoker.  Osteoporosis. You may be screened starting at age 72 if you are at high risk. Talk with your health care provider about your test results, treatment options, and if necessary, the need for more tests. Vaccines  Your health care provider may recommend certain vaccines, such as:  Influenza vaccine. This is recommended every year.  Tetanus, diphtheria, and acellular pertussis (Tdap, Td)  vaccine. You may need a Td booster every 10 years.  Zoster vaccine. You may need this after age 72.  Pneumococcal 13-valent conjugate (PCV13) vaccine. One dose is recommended after age  72.  Pneumococcal polysaccharide (PPSV23) vaccine. One dose is recommended after age 72. Talk to your health care provider about which screenings and vaccines you need and how often you need them. This information is not intended to replace advice given to you by your health care provider. Make sure you discuss any questions you have with your health care provider. Document Released: 12/09/2015 Document Revised: 08/01/2016 Document Reviewed: 09/13/2015 Elsevier Interactive Patient Education  2017 [place] Prevention in the Home Falls can cause injuries. They can happen to people of all ages. There are many things you can do to make your home safe and to help prevent falls. What can I do on the outside of my home?  Regularly fix the edges of walkways and driveways and fix any cracks.  Remove anything that might make you trip as you walk through a door, such as a raised step or threshold.  Trim any bushes or trees on the path to your home.  Use bright outdoor lighting.  Clear any walking paths of anything that might make someone trip, such as rocks or tools.  Regularly check to see if handrails are loose or broken. Make sure that both sides of any steps have handrails.  Any raised decks and porches should have guardrails on the edges.  Have any leaves, snow, or ice cleared regularly.  Use sand or salt on walking paths during winter.  Clean up any spills in your garage right away. This includes oil or grease spills. What can I do in the bathroom?  Use night lights.  Install grab bars by the toilet and in the tub and shower. Do not use towel bars as grab bars.  Use non-skid mats or decals in the tub or shower.  If you need to sit down in the shower, use a plastic, non-slip stool.  Keep the floor dry. Clean up any water that spills on the floor as soon as it happens.  Remove soap buildup in the tub or shower regularly.  Attach bath mats securely with double-sided  non-slip rug tape.  Do not have throw rugs and other things on the floor that can make you trip. What can I do in the bedroom?  Use night lights.  Make sure that you have a light by your bed that is easy to reach.  Do not use any sheets or blankets that are too big for your bed. They should not hang down onto the floor.  Have a firm chair that has side arms. You can use this for support while you get dressed.  Do not have throw rugs and other things on the floor that can make you trip. What can I do in the kitchen?  Clean up any spills right away.  Avoid walking on wet floors.  Keep items that you use a lot in easy-to-reach places.  If you need to reach something above you, use a strong step stool that has a grab bar.  Keep electrical cords out of the way.  Do not use floor polish or wax that makes floors slippery. If you must use wax, use non-skid floor wax.  Do not have throw rugs and other things on the floor that can make you trip. What can I do with my stairs?  Do not leave  any items on the stairs.  Make sure that there are handrails on both sides of the stairs and use them. Fix handrails that are broken or loose. Make sure that handrails are as long as the stairways.  Check any carpeting to make sure that it is firmly attached to the stairs. Fix any carpet that is loose or worn.  Avoid having throw rugs at the top or bottom of the stairs. If you do have throw rugs, attach them to the floor with carpet tape.  Make sure that you have a light switch at the top of the stairs and the bottom of the stairs. If you do not have them, ask someone to add them for you. What else can I do to help prevent falls?  Wear shoes that:  Do not have high heels.  Have rubber bottoms.  Are comfortable and fit you well.  Are closed at the toe. Do not wear sandals.  If you use a stepladder:  Make sure that it is fully opened. Do not climb a closed stepladder.  Make sure that both  sides of the stepladder are locked into place.  Ask someone to hold it for you, if possible.  Clearly mark and make sure that you can see:  Any grab bars or handrails.  First and last steps.  Where the edge of each step is.  Use tools that help you move around (mobility aids) if they are needed. These include:  Canes.  Walkers.  Scooters.  Crutches.  Turn on the lights when you go into a dark area. Replace any light bulbs as soon as they burn out.  Set up your furniture so you have a clear path. Avoid moving your furniture around.  If any of your floors are uneven, fix them.  If there are any pets around you, be aware of where they are.  Review your medicines with your doctor. Some medicines can make you feel dizzy. This can increase your chance of falling. Ask your doctor what other things that you can do to help prevent falls. This information is not intended to replace advice given to you by your health care provider. Make sure you discuss any questions you have with your health care provider. Document Released: 09/08/2009 Document Revised: 04/19/2016 Document Reviewed: 12/17/2014 Elsevier Interactive Patient Education  2017 [employer].

## 2021-03-04 ENCOUNTER — Encounter: Payer: Self-pay | Admitting: Family Medicine

## 2021-03-06 ENCOUNTER — Other Ambulatory Visit: Payer: Self-pay

## 2021-03-06 MED ORDER — CARVEDILOL 6.25 MG PO TABS: 6.2500 mg | ORAL_TABLET | Freq: Two times a day (BID) | ORAL | 0 refills | Status: DC

## 2021-03-08 ENCOUNTER — Encounter: Payer: Self-pay | Admitting: Family Medicine

## 2021-03-09 NOTE — Telephone Encounter (Signed)
Please advise on message from patient below:  I would like to inquire if I am eligible for a 2nd COVID booster due to my age and current medical History.  I will schedule an appointment once I receive your  reply.  Thanks. dmc

## 2021-03-20 ENCOUNTER — Telehealth: Payer: Self-pay

## 2021-03-20 DIAGNOSIS — I129 Hypertensive chronic kidney disease with stage 1 through stage 4 chronic kidney disease, or unspecified chronic kidney disease: Secondary | ICD-10-CM | POA: Diagnosis not present

## 2021-03-20 DIAGNOSIS — D351 Benign neoplasm of parathyroid gland: Secondary | ICD-10-CM | POA: Diagnosis not present

## 2021-03-20 DIAGNOSIS — N183 Chronic kidney disease, stage 3 unspecified: Secondary | ICD-10-CM | POA: Diagnosis not present

## 2021-03-20 DIAGNOSIS — D631 Anemia in chronic kidney disease: Secondary | ICD-10-CM | POA: Diagnosis not present

## 2021-03-20 NOTE — Telephone Encounter (Signed)
pts wife called about the [employer] booster. They located a place that had it but not the one he needs. They have [employer]. She is asking if he can use either of those. Thank you    (I wasn't sure how to add this to existing MyChart message)

## 2021-03-20 NOTE — Telephone Encounter (Signed)
Spoke to patient's wife and advise of CDC guidelines that you can get the [facility] or Pfiser booster if you have had the J&J first. She will go ahead and schedule him an appt to get the booster shot then.  No further questions.  Dm/cma

## 2021-03-26 ENCOUNTER — Encounter: Payer: Self-pay | Admitting: Family Medicine

## 2021-03-26 ENCOUNTER — Other Ambulatory Visit: Payer: Self-pay | Admitting: Family Medicine

## 2021-03-26 DIAGNOSIS — Z87898 Personal history of other specified conditions: Secondary | ICD-10-CM

## 2021-03-26 DIAGNOSIS — I10 Essential (primary) hypertension: Secondary | ICD-10-CM

## 2021-03-26 DIAGNOSIS — R339 Retention of urine, unspecified: Secondary | ICD-10-CM

## 2021-03-27 MED ORDER — LOSARTAN POTASSIUM 50 MG PO TABS: 50.0000 mg | ORAL_TABLET | Freq: Two times a day (BID) | ORAL | 2 refills | Status: DC

## 2021-03-27 MED ORDER — OXYBUTYNIN CHLORIDE 5 MG PO TABS
5.0000 mg | ORAL_TABLET | Freq: Every day | ORAL | 0 refills | Status: DC
Start: 2021-03-27 — End: 2021-05-28

## 2021-03-27 NOTE — Telephone Encounter (Signed)
Last OV 02/10/21 Last fill 02/06/21 # unknown

## 2021-04-03 ENCOUNTER — Ambulatory Visit (INDEPENDENT_AMBULATORY_CARE_PROVIDER_SITE_OTHER): Payer: Medicare Other | Admitting: Neurology

## 2021-04-03 ENCOUNTER — Encounter: Payer: Self-pay | Admitting: Neurology

## 2021-04-03 ENCOUNTER — Other Ambulatory Visit: Payer: Self-pay

## 2021-04-03 VITALS — BP 162/92 | HR 76 | Ht 70.0 in | Wt 162.6 lb

## 2021-04-03 DIAGNOSIS — F0670 Mild neurocognitive disorder due to known physiological condition without behavioral disturbance: Secondary | ICD-10-CM

## 2021-04-03 DIAGNOSIS — G3184 Mild cognitive impairment, so stated: Secondary | ICD-10-CM

## 2021-04-03 MED ORDER — DONEPEZIL HCL 10 MG PO TABS: ORAL_TABLET | ORAL | 11 refills | Status: DC

## 2021-04-03 NOTE — Progress Notes (Signed)
NEUROLOGY CONSULTATION NOTE  Casey Greene MRN: [id number] DOB: 1951/04/24  Referring provider: Dr. Hazle Coca Primary care provider: Dr. Abelino Derrick  Reason for consult:  Mild Neurocognitive Disorder due to multiple etiologies  Dear Dr Melvyn Novas:  Thank you for your kind referral of Casey Greene for consultation of the above symptoms. Although his history is well known to you, please allow me to reiterate it for the purpose of our medical record. The patient was accompanied to the clinic by his wife who also provides collateral information. Records and images were personally reviewed where available.   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 He underwent Neuropsychological testing in 12/2020, results reviewed, there was note of "fairly diffuse cognitive dysfunction with primary impairments surrounding executive functioning, receptive language, semantic fluency, confrontation naming, and visuospatial abilities. Verbal memory also represented a notable weakness, while variability was exhibited across processing speed and attention/concentration. Overall, he is likely on the cusp between mild and major neurocognitive disorder distinctions. However, at the present time, he is perhaps best characterized as being towards the severe end of the mild neurocognitive disorder ("mild cognitive impairment") spectrum. I believe it very likely that there is a significant vascular etiology given his numerous cardiovascular ailments and neuroimaging suggesting remote pontine and bilateral thalamic infarcts. However, while a vascular cause would certainly explain deficits in processing speed, attention/concentration, executive functioning, verbal fluency, and encoding/retrieval aspects of memory, the extent  of dysfunction in receptive language, confrontation naming, and visuospatial abilities would be surprising. As such, there remains the possibility for an underlying neurodegenerative condition exacerbated by cerebrovascular changes (i.e., a "mixed dementia" presentation). Alzheimer's disease represents the most common neurodegenerative illness and could explain deficits in confrontation naming and visuospatial abilities."  He thinks his memory is good. He lives with his wife and son. His wife started noticing changes around November 2021. His blood pressure kept going up, he said he was taking his medications but they found out he had not been taking them. His wife has started managing medications since then. He stopped driving a couple of months ago due to left knee issues, he got lost driving in December but otherwise his wife denies any driving concerns. His wife manages finances. He is independent with dressing and bathing. His wife has noticed that he is "worried about getting things exact," which is new. He says this was a one time incident, but she reminds him this is not, when he is talking about something, he wants to make sure everything is exact and wants to keep clarifying things. His wife denies any personality changes, paranoia, hallucinations. He feels his mood is good. Sleep is fair, no REM behaviors. No daytime drowsiness. He denies any headaches, dizziness, diplopia, dysarthria/dysphagia, neck/back pain, focal numbness/tingling/weakness, bowel incontinence, anosmia, or tremors. His tongue stays dry despite keeping hydrated. He has urinary incontinence and wears Depends. Last fall was 9 months ago, he has been using a cane since then due to left knee problems.   I personally reviewed head CT without contrast done 11/2020 which did not show any acute changes. There was mild diffuse atrophy and chronic microvascular disease, remote lacunar infarcts in the bilateral thalami and central pons. They  deny any prior history of stroke symptoms.  Laboratory Data: Lab Results  Component Value Date   TSH 2.72 03/28/2018   Lab Results  Component Value Date   [id number] 852 06/17/2020     PAST MEDICAL HISTORY: Past Medical History:  Diagnosis Date  . Anemia of chronic disease 03/28/2018  . Ascending aorta dilation 07/29/2019   (a) Echo 07/29/19 23mm  . Bleeding per rectum 12/09/2018  . CKD (chronic kidney disease) stage 3, GFR 30-59 ml/min (HCC) 10/13/2014   Discussed with patient importance of good BP control, avoiding nephrotoxic meds/supplements in preventing further progression  . Constipation 12/01/2018  . Corn or callus 06/11/2019  . Decreased appetite   . Diastolic congestive heart failure ([place]) 02/03/2021  . DOE (dyspnea on exertion) 06/17/2020  . Dysrhythmia   . Edema 07/09/2016  . Elevated hemoglobin A1c 06/11/2019  . Essential hypertension 06/09/2014   Overview:  metoprolol caused bradycardia amlodipine causes headache, leg swelling  Last Assessment & Plan:  We had a long discussion regarding goals of treatment of HTN to prevent worsening cardiac and renal disease.  We also discussed options to change his regimen.  He states that he would prefer to seek a practitioner who can recommend herbal remedies as he feels thhis will be a healthier path f  . Hepatitis C virus infection cured after antiviral drug therapy 07/05/2014  . History of gout   . History of urinary frequency 12/05/2020  . Hyperlipidemia 06/29/2014  . Hypokalemia 12/09/2018  . Laryngeal mass 06/17/2020  . Localized osteoarthritis of left knee 06/11/2020   Left knee injection-06/10/2020  . LVH (left ventricular hypertrophy) due to hypertensive disease    (a) echo 07/29/19 severe concentric LVH  . Major depressive disorder 02/27/2016  . Mass of thyroid region 03/28/2018  . Mild neurocognitive disorder due to multiple etiologies 12/30/2020  . Nonsustained ventricular tachycardia 07/23/2019   (a) Zio monitor  06/2019. Subsequent echo with severe concentric LVH, but normal EF 55-60%  . Onychomycosis 12/01/2013  . PAC (premature atrial contraction)    (a) ZIO 06/2019. Asymptomatic  . Prostate cancer 1997   Dx in 1997, never treated, taking herbal supplements. S/p prostatectomy 2017  . PVD (peripheral vascular disease) 12/01/2013  . Stroke    Per 12/26/20 MRI - remote pontine and bilateral thalamic infarcts   . Urinary retention     PAST SURGICAL HISTORY: Past Surgical History:  Procedure Laterality Date  . ACHILLES TENDON REPAIR    . CERVICAL DISC SURGERY     neck  . COLONOSCOPY     before 2010   . LYMPHADENECTOMY Bilateral 01/26/2016   Procedure: EXTENDED BILATERAL PELVIC LYMPHADENECTOMY;  Surgeon: Raynelle Bring, MD;  Location: [facility];  Service: Urology;  Laterality: Bilateral;  . NOSE SURGERY     in high school-nasal fracturex2  . ROBOT ASSISTED LAPAROSCOPIC RADICAL PROSTATECTOMY N/A 01/26/2016   Procedure: XI ROBOTIC ASSISTED LAPAROSCOPIC RADICAL PROSTATECTOMY LEVEL 3;  Surgeon: Raynelle Bring, MD;  Location: [facility];  Service: Urology;  Laterality: N/A;    MEDICATIONS: Current Outpatient Medications on File Prior to Visit  Medication Sig Dispense Refill  . aspirin EC 81 MG tablet Take 81 mg by mouth daily. Swallow whole.    . carbamide peroxide (DEBROX) 6.5 % OTIC solution Place 5 drops into the left ear 2 (two) times daily. 15 mL 0  . carvedilol (COREG) 6.25 MG tablet Take 1 tablet (6.25 mg total) by mouth 2 (two) times daily with a meal. 180 tablet 0  . ferrous sulfate 325 (65 FE) MG tablet Take 325 mg by mouth daily with breakfast.    . hydrALAZINE (APRESOLINE) 100 MG tablet Take 1  tablet (100 mg total) by mouth 3 (three) times daily. 90 tablet 3  . losartan (COZAAR) 50 MG tablet Take 1 tablet (50 mg total) by mouth 2 (two) times daily. 90 tablet 2  . oxybutynin (DITROPAN) 5 MG tablet Take 1 tablet (5 mg total) by mouth at bedtime. 90 tablet 0  . vitamin C (ASCORBIC ACID) 500 MG tablet       No current facility-administered medications on file prior to visit.    ALLERGIES: Allergies  Allergen Reactions  . Amlodipine Other (See Comments)    headache  . Tamsulosin Hcl     FAMILY HISTORY: Family History  Problem Relation Age of Onset  . Heart disease Mother   . Obesity Mother   . Cervical cancer Mother   . Dementia Mother        unspecified type; symptom onset in late 60s/early 69s  . Heart disease Father   . Diabetes Father   . Stroke Father   . ALS Sister   . Stroke Paternal Grandmother   . Stroke Paternal Grandfather   . Colon cancer Neg Hx   . Esophageal cancer Neg Hx   . Colon polyps Neg Hx   . Rectal cancer Neg Hx   . Stomach cancer Neg Hx     SOCIAL HISTORY: Social History   Socioeconomic History  . Marital status: Married    Spouse name: Not on file  . Number of children: Not on file  . Years of education: 54  . Highest education level: Some college, no degree  Occupational History  . Occupation: retired  Tobacco Use  . Smoking status: Former Smoker    Packs/day: 0.50    Years: 10.00    Pack years: 5.00    Types: Cigarettes    Quit date: 01/11/2006    Years since quitting: 15.2  . Smokeless tobacco: Never Used  Vaping Use  . Vaping Use: Never used  Substance and Sexual Activity  . Alcohol use: No  . Drug use: Not Currently    Types: Cocaine, Marijuana    Comment: hx of cocaine abuse; stopped approximately 12 years prior  . Sexual activity: Not Currently  Other Topics Concern  . Not on file  Social History Narrative  . Not on file   Social Determinants of Health   Financial Resource Strain: Low Risk   . Difficulty of Paying Living Expenses: Not hard at all  Food Insecurity: No Food Insecurity  . Worried About Charity fundraiser in the Last Year: Never true  . Ran Out of Food in the Last Year: Never true  Transportation Needs: No Transportation Needs  . Lack of Transportation (Medical): No  . Lack of Transportation  (Non-Medical): No  Physical Activity: Insufficiently Active  . Days of Exercise per Week: 7 days  . Minutes of Exercise per Session: 20 min  Stress: No Stress Concern Present  . Feeling of Stress : Not at all  Social Connections: Moderately Integrated  . Frequency of Communication with Friends and Family: More than three times a week  . Frequency of Social Gatherings with Friends and Family: Once a week  . Attends Religious Services: 1 to 4 times per year  . Active Member of Clubs or Organizations: No  . Attends Archivist Meetings: Never  . Marital Status: Married  Human resources officer Violence: Not At Risk  . Fear of Current or Ex-Partner: No  . Emotionally Abused: No  . Physically Abused: No  . Sexually  Abused: No     PHYSICAL EXAM: Vitals:   04/03/21 1005  BP: (!) 162/92  Pulse: 76  SpO2: 97%   General: No acute distress Head:  Normocephalic/atraumatic Skin/Extremities: No rash, no edema Neurological Exam: Mental status: alert and awake, no dysarthria or aphasia, Fund of knowledge is appropriate.  Attention and concentration are normal.     Cranial nerves: CN I: not tested CN II: pupils equal, round and reactive to light, visual fields intact CN III, IV, VI:  full range of motion, no nystagmus, no ptosis CN V: facial sensation intact CN VII: upper and lower face symmetric CN VIII: hearing intact to conversation CN XI: sternocleidomastoid and trapezius muscles intact CN XII: tongue midline Bulk & Tone: normal, no fasciculations. Motor: 5/5 throughout with no pronator drift. Sensation: intact to light touch, cold, pin, vibration sense.  No extinction to double simultaneous stimulation.  Romberg test negative Deep Tendon Reflexes: +2 on both UE, +1 both LE Cerebellar: no incoordination on finger to nose testing Gait: slow and cautious with cane, favoring left knee, no ataxia Tremor: none   IMPRESSION: This is a 70 year old right-handed man with a history of  hypertension, hyperlipidemia, prostate cancer, presenting for evaluation of Mild Neurocognitive Disorder, possibly mixed etiology (vascular and Alzheimer's disease). Findings discussed with the patient and his wife. Discussed recommendations, including MRI brain without contrast to assess for underlying structural abnormality and assess vascular load. Discussed starting Donepezil, including side effects and expectations from medication. Repeat Neuropsychological evaluation will be scheduled for August 2023 to assess trajectory. We discussed the importance of control of vascular risk factors, physical exercise, and brain stimulation exercises for brain health. Follow-up with our Memory Disorders PA Sharene Butters in 6 months, they know to call for any changes.   Thank you for allowing me to participate in the care of this patient. Please do not hesitate to call for any questions or concerns.   Ellouise Newer, M.D.  CC: Dr. Ethelene Hal, Dr. Melvyn Novas

## 2021-04-03 NOTE — Patient Instructions (Signed)
1. Schedule MRI brain without contrast  2. Start Donepezil 10mg : take 1/2 tablet daily for 2 weeks, then increase to 1 tablet daily  3. Follow-up in 6 months, call for any changes   FALL PRECAUTIONS: Be cautious when walking. Scan the area for obstacles that may increase the risk of trips and falls. When getting up in the mornings, sit up at the edge of the bed for a few minutes before getting out of bed. Consider elevating the bed at the head end to avoid drop of blood pressure when getting up. Walk always in a well-lit room (use night lights in the walls). Avoid area rugs or power cords from appliances in the middle of the walkways. Use a walker or a cane if necessary and consider physical therapy for balance exercise. Get your eyesight checked regularly.  HOME SAFETY: Consider the safety of the kitchen when operating appliances like stoves, microwave oven, and blender. Consider having supervision and share cooking responsibilities until no longer able to participate in those. Accidents with firearms and other hazards in the house should be identified and addressed as well.  DRIVING: Regarding driving, in patients with progressive memory problems, driving will be impaired. We advise to have someone else do the driving if trouble finding directions or if minor accidents are reported. Independent driving assessment is available to determine safety of driving.  ABILITY TO BE LEFT ALONE: If patient is unable to contact 911 operator, consider using LifeLine, or when the need is there, arrange for someone to stay with patients. Smoking is a fire hazard, consider supervision or cessation. Risk of wandering should be assessed by caregiver and if detected at any point, supervision and safe proof recommendations should be instituted.  RECOMMENDATIONS FOR ALL PATIENTS WITH MEMORY PROBLEMS: 1. Continue to exercise (Recommend 30 minutes of walking everyday, or 3 hours every week) 2. Increase social  interactions - continue going to [place] and enjoy social gatherings with friends and family 3. Eat healthy, avoid fried foods and eat more fruits and vegetables 4. Maintain adequate blood pressure, blood sugar, and blood cholesterol level. Reducing the risk of stroke and cardiovascular disease also helps promoting better memory. 5. Avoid stressful situations. Live a simple life and avoid aggravations. Organize your time and prepare for the next day in anticipation. 6. Sleep well, avoid any interruptions of sleep and avoid any distractions in the bedroom that may interfere with adequate sleep quality 7. Avoid sugar, avoid sweets as there is a strong link between excessive sugar intake, diabetes, and cognitive impairment We discussed the Mediterranean diet, which has been shown to help patients reduce the risk of progressive memory disorders and reduces cardiovascular risk. This includes eating fish, eat fruits and green leafy vegetables, nuts like almonds and hazelnuts, walnuts, and also use olive oil. Avoid fast foods and fried foods as much as possible. Avoid sweets and sugar as sugar use has been linked to worsening of memory function.  There is always a concern of gradual progression of memory problems. If this is the case, then we may need to adjust level of care according to patient needs. Support, both to the patient and caregiver, should then be put into place.

## 2021-04-06 NOTE — Progress Notes (Signed)
NO PA required

## 2021-04-14 ENCOUNTER — Telehealth: Payer: Self-pay | Admitting: Family Medicine

## 2021-04-14 ENCOUNTER — Other Ambulatory Visit: Payer: Self-pay

## 2021-04-14 DIAGNOSIS — I10 Essential (primary) hypertension: Secondary | ICD-10-CM

## 2021-04-14 MED ORDER — LOSARTAN POTASSIUM 50 MG PO TABS: 50.0000 mg | ORAL_TABLET | Freq: Every day | ORAL | 1 refills | Status: DC

## 2021-04-14 MED ORDER — LOSARTAN POTASSIUM 50 MG PO TABS: 50.0000 mg | ORAL_TABLET | Freq: Every day | ORAL | 0 refills | Status: DC

## 2021-04-14 NOTE — Telephone Encounter (Signed)
What is the name of the medication? Casey Greene (COZAAR) 50 MG tablet [[id number]]    Have you contacted your pharmacy to request a refill? [facility] is sending [place] a request over, I let her know we sent the script over on 03/27/21. They are saying they do not have it. He is down to his last 4 pills, he is needing 5-7 pills to last him until he gets the script from [facility].   Which pharmacy would you like this sent to? [facility], [place], [place] [address]   Patient notified that their request is being sent to the clinical staff for review and that they should receive a call once it is complete. If they do not receive a call within 72 hours they can check with their pharmacy or our office.

## 2021-04-14 NOTE — Telephone Encounter (Signed)
Spoke to pt told him Rx for Losartan was sent to local and mail order pharmacy. Pt verbalized understanding.

## 2021-04-15 ENCOUNTER — Ambulatory Visit
Admission: RE | Admit: 2021-04-15 | Discharge: 2021-04-15 | Disposition: A | Discharge status: Home / Self Care | Payer: Medicare Other | Source: Ambulatory Visit | Attending: Neurology | Admitting: Neurology

## 2021-04-15 ENCOUNTER — Other Ambulatory Visit: Payer: Self-pay

## 2021-04-15 DIAGNOSIS — I6782 Cerebral ischemia: Secondary | ICD-10-CM | POA: Diagnosis not present

## 2021-04-15 DIAGNOSIS — F0670 Mild neurocognitive disorder due to known physiological condition without behavioral disturbance: Secondary | ICD-10-CM

## 2021-04-15 DIAGNOSIS — R413 Other amnesia: Secondary | ICD-10-CM | POA: Diagnosis not present

## 2021-04-15 DIAGNOSIS — L720 Epidermal cyst: Secondary | ICD-10-CM | POA: Diagnosis not present

## 2021-04-15 DIAGNOSIS — G3184 Mild cognitive impairment, so stated: Secondary | ICD-10-CM

## 2021-04-15 DIAGNOSIS — I639 Cerebral infarction, unspecified: Secondary | ICD-10-CM | POA: Diagnosis not present

## 2021-04-17 ENCOUNTER — Telehealth: Payer: Self-pay | Admitting: Neurology

## 2021-04-17 MED ORDER — CLOPIDOGREL BISULFATE 75 MG PO TABS: 75.0000 mg | ORAL_TABLET | Freq: Every day | ORAL | 11 refills | Status: DC

## 2021-04-17 NOTE — Telephone Encounter (Signed)
Tried to call patient/wife with MRI results. No answer on home phone, unable to leave VM. Called both cell numbers of patient and wife, went straight to VM on both.

## 2021-04-17 NOTE — Telephone Encounter (Signed)
Spoke to patient and wife about MRI brain showing subacute infarcts in the right frontal and occipital lobes. There is moderate chronic microvascular disease. Etiology of stroke unclear, he denies missing aspirin. Discussed doing stroke workup, check lipid panel, HbA1c. Order MRA head without contrast, MRA neck with and without contrast, and echocardiogram. Advised to start Plavix 75mg  daily, stop aspirin. Side effects discussed. Advised to go to ER for any sudden change in symptoms, they expressed understanding. All their questions were answered.   Heather, pls order: 1. Lipid panel, HbA1c (pls let them know which lab to go)  2. MRA head without contrast, MRA neck with and without contrast (dx: stroke)  3. Complete echocardiogram with bubble study  Thanks!

## 2021-04-18 ENCOUNTER — Other Ambulatory Visit: Payer: Self-pay

## 2021-04-18 DIAGNOSIS — I639 Cerebral infarction, unspecified: Secondary | ICD-10-CM

## 2021-04-18 NOTE — Telephone Encounter (Signed)
Orders placed in epic

## 2021-04-18 NOTE — Telephone Encounter (Signed)
Pt called no answer per DPR left a voice mail stating that lab work can be done at our office they will need to come to [address] to check in and then go to [address] to have labs done, also MRA of head and neck has been ordered they will be done at [facility] they will get a call from [facility] to get those test scheduled lastly the echocardiogram has been ordered they will get another call to get that test scheduled if they have any questions they can call the office back at [phone].

## 2021-04-21 ENCOUNTER — Ambulatory Visit: Payer: Medicare Other | Admitting: Cardiology

## 2021-04-21 NOTE — Progress Notes (Unsigned)
No prior needed for cpt [phone].

## 2021-04-25 ENCOUNTER — Encounter: Payer: Self-pay | Admitting: Family Medicine

## 2021-04-25 DIAGNOSIS — K921 Melena: Secondary | ICD-10-CM | POA: Insufficient documentation

## 2021-04-28 ENCOUNTER — Telehealth: Payer: Self-pay | Admitting: Family Medicine

## 2021-04-28 ENCOUNTER — Telehealth: Payer: Self-pay

## 2021-04-28 NOTE — Chronic Care Management (AMB) (Signed)
    Chronic Care Management Pharmacy Assistant   Name: Casey Greene  MRN: [id number] DOB: [birth year]  Date- Patient called to remind of appointment with  Junius Argyle, CPP  on 05/01/2021 @ 1000 am.  Patient aware of appointment date, time, and type of appointment (either telephone or in person). Patient aware to have/bring all medications, supplements, blood pressure and/or blood sugar logs to visit.  Questions: Have you had any recent office visit or specialist visit outside of [facility]? Patient reported that his last visit was with Nephrology.   Are there any concerns you would like to discuss during your office visit? Patient does have a concern of swelling X 6 months on and off. He states that he does elevate with 3 pillows, watch his sodium intake, and take his water pills as directed but nothing is helping. He denies any pain upon standing or sitting, not hot to the touch, no discoloration, and no weeping of his ankles. He stated he does use a cane or walker when standing , and his last fall was a year ago , but he is still having issues with his knees. He does have an upcoming appointment  wiith Dr. Gena Fray (PCP) on 05/05/2021 for rectal bleeding. I will call the PCP's office today to inform them about his concerns of swelling. Junius Argyle, CPP also notified.  Contacted the providers office and spoke with Barnett Applebaum and she is going to send a message to Dr. Ethelene Hal and his assistant to contact the patient today.   Are you having any problems obtaining your medications? (Whether it pharmacy issues or cost) If patient has any PAP medications ask if they are having any problems getting their PAP medication or refill? No his wife assist him with all medications no issues with obtaining.  Star Rating Drug: Losartan Potassium 50 mg last filled 04/14/2021 for a 10 day supply at [facility]  Any gaps in medications fill history? Hydralazine 100 mg last filled 01/24/2021 for a 90-Day  Supply- patient reports he just picked up his prescription.   [place] Pharmacist Assistant [phone]

## 2021-04-28 NOTE — Telephone Encounter (Signed)
The pharmacist's assistant is calling in for the pt. He is experiencing ankle swelling for the past 3 months, but something has changed and they are swelling more. He has cut back on his salt intake, takes his meds and elevates his legs with 3 pillows in the morning and at night. Please advise pt, he is very concerned.

## 2021-04-28 NOTE — Telephone Encounter (Signed)
Returned call no answer LMTCB 

## 2021-04-30 ENCOUNTER — Ambulatory Visit
Admission: RE | Admit: 2021-04-30 | Discharge: 2021-04-30 | Disposition: A | Discharge status: Home / Self Care | Payer: Medicare Other | Source: Ambulatory Visit | Attending: Neurology | Admitting: Neurology

## 2021-04-30 ENCOUNTER — Other Ambulatory Visit: Payer: Self-pay

## 2021-04-30 DIAGNOSIS — I639 Cerebral infarction, unspecified: Secondary | ICD-10-CM

## 2021-04-30 MED ORDER — GADOBENATE DIMEGLUMINE 529 MG/ML IV SOLN
13.0000 mL | Freq: Once | INTRAVENOUS | Status: AC | PRN
  Administered 2021-04-30: 13 mL via INTRAVENOUS

## 2021-05-01 ENCOUNTER — Ambulatory Visit (INDEPENDENT_AMBULATORY_CARE_PROVIDER_SITE_OTHER): Payer: Medicare Other

## 2021-05-01 DIAGNOSIS — E782 Mixed hyperlipidemia: Secondary | ICD-10-CM | POA: Diagnosis not present

## 2021-05-01 DIAGNOSIS — N184 Chronic kidney disease, stage 4 (severe): Secondary | ICD-10-CM | POA: Diagnosis not present

## 2021-05-01 DIAGNOSIS — I10 Essential (primary) hypertension: Secondary | ICD-10-CM | POA: Diagnosis not present

## 2021-05-01 NOTE — Progress Notes (Signed)
Chronic Care Management Pharmacy Note  05/01/2021 Name:  Casey Greene MRN:  [id number] DOB:  06-08-1951  Summary: Patient has had multiple medication changes since his last visit. He reports he is tolerating donepezil and furosemide well. Patient continues to have significant ankle swelling which has not improved since starting furosemide as well as struggling with blood in his stool which he attributes to constipation. He recently received a new blood pressure wrist monitor, but has not started using it yet.   Recommendations/Changes made from today's visit: Recommend rechecking fasting lipid panel and starting Atorvastatin 40 mg daily for secondary stroke prevention  Recommend compression stockings for ankle swelling  Recommend home blood pressure monitoring twice weekly  Subjective: Casey Greene is an 70 y.o. year old male who is a primary patient of Ethelene Hal Mortimer Fries, MD.  The CCM team was consulted for assistance with disease management and care coordination needs.    Engaged with patient by telephone for follow up visit in response to provider referral for pharmacy case management and/or care coordination services.   Consent to Services:  The patient was given information about Chronic Care Management services, agreed to services, and gave verbal consent prior to initiation of services.  Please see initial visit note for detailed documentation.   Patient Care Team: Libby Maw, MD as PCP - General (Family Medicine) Park Liter, MD as PCP - Cardiology (Cardiology) Germaine Pomfret, [facility] as Pharmacist (Pharmacist) Festus Aloe, MD as Consulting Physician (Urology) Rosita Fire, MD as Consulting Physician (Nephrology) [name], MD as Consulting Physician (Neurology)  Recent office visits: 02/28/21: Patient presented to Caroleen Hamman, LPN for AWV.  0/81/44: Patient presented to Dr. Ethelene Hal for Ceuminosis. Cinacalcet, furosemide stopped.   12/27/20: Patient presented to Dr. Gena Fray for follow-up. Hydralazine increased to 100 mg three times daily. Calcium stopped.   Recent consult visits: 04/03/21: Patient presented to [name] (Neurology) for follow-up. Donepezil 10 mg started.  02/03/21: Patient presented to Dr. Agustin Cree (Cardiology) for follow-up. BNP elevated, patient started on furosemide 20 mg daily.  12/27/20: Patient presented to Dr. Melvyn Novas (neurology) for follow-up. No medication changes made.  12/30/20: Patient presented to Dr. Melvyn Novas (neurology) for follow-up. Patient with mild cognitive impairment.    Hospital visits: 12/26/20: Patient presented to ED for generalized weakness.  Objective:  Lab Results  Component Value Date   CREATININE 2.42 (H) 02/03/2021   BUN 42 (H) 02/03/2021   GFR 30.42 (L) 10/27/2019   GFRNONAA 30 (L) 12/26/2020   GFRAA 31 04/13/2020   NA 146 (H) 02/03/2021   K 4.1 02/03/2021   CALCIUM 10.6 (H) 02/03/2021   CO2 24 02/03/2021    Lab Results  Component Value Date/Time   HGBA1C 5.8 06/11/2019 10:38 AM   HGBA1C 11.2 04/23/2019 12:00 AM   GFR 30.42 (L) 10/27/2019 01:28 PM   GFR 26.45 (L) 06/11/2019 10:38 AM   MICROALBUR 13.8 (H) 03/28/2018 12:06 PM   MICROALBUR 3.40 (H) 05/24/2010 10:08 PM    Last diabetic Eye exam: No results found for: HMDIABEYEEXA  Last diabetic Foot exam: No results found for: HMDIABFOOTEX   Lab Results  Component Value Date   CHOL 190 03/28/2018   HDL 61.90 03/28/2018   LDLCALC 116 (H) 03/28/2018   TRIG 61.0 03/28/2018   CHOLHDL 3 03/28/2018    Hepatic Function Latest Ref Rng & Units 12/26/2020 04/13/2020 12/23/2019  Total Protein 6.5 - 8.1 g/dL 8.2(H) - -  Albumin 3.5 - 5.0 g/dL 4.6 4.1 4.3  AST 15 - 41 U/L 18 - -  ALT 0 - 44 U/L 10 - -  Alk Phosphatase 38 - 126 U/L 53 - -  Total Bilirubin 0.3 - 1.2 mg/dL 0.8 - -    Lab Results  Component Value Date/Time   TSH 2.72 03/28/2018 12:06 PM   TSH 1.748 05/24/2010 10:08 PM    CBC Latest Ref Rng & Units 12/26/2020  10/07/2020 06/17/2020  WBC 4.0 - 10.5 K/uL 6.7 6.5 6.6  Hemoglobin 13.0 - 17.0 g/dL 12.8(L) 12.5(L) 12.0(L)  Hematocrit 39.0 - 52.0 % 40.6 37.3(L) 36.1(L)  Platelets 150 - 400 K/uL 148(L) 170 169    Lab Results  Component Value Date/Time   VD25OH 24.7 04/13/2020 12:00 AM   VD25OH 55 12/23/2019 12:00 AM    Clinical ASCVD: Yes  The ASCVD Risk score Mikey Bussing DC Jr., et al., 2013) failed to calculate for the following reasons:   The patient has a prior MI or stroke diagnosis    Depression screen [facility] 2/9 02/28/2021 02/10/2021 12/27/2020  Decreased Interest 0 0 0  Down, Depressed, Hopeless 0 0 0  PHQ - 2 Score 0 0 0  Altered sleeping - - -  Tired, decreased energy - - -  Change in appetite - - -  Feeling bad or failure about yourself  - - -  Trouble concentrating - - -  Moving slowly or fidgety/restless - - -  Suicidal thoughts - - -  PHQ-9 Score - - -  Difficult doing work/chores - - -  Some recent data might be hidden     Social History   Tobacco Use  Smoking Status Former Smoker  . Packs/day: 0.50  . Years: 10.00  . Pack years: 5.00  . Types: Cigarettes  . Quit date: 01/11/2006  . Years since quitting: 15.3  Smokeless Tobacco Never Used   BP Readings from Last 3 Encounters:  04/03/21 (!) 162/92  02/10/21 (!) 142/80  02/03/21 110/72   Pulse Readings from Last 3 Encounters:  04/03/21 76  02/10/21 62  02/03/21 73   Wt Readings from Last 3 Encounters:  04/03/21 162 lb 9.6 oz (73.8 kg)  02/28/21 177 lb (80.3 kg)  02/10/21 177 lb 9.6 oz (80.6 kg)    Assessment/Interventions: Review of patient past medical history, allergies, medications, health status, including review of consultants reports, laboratory and other test data, was performed as part of comprehensive evaluation and provision of chronic care management services.   SDOH:  (Social Determinants of Health) assessments and interventions performed: Yes SDOH Interventions   Flowsheet Row Most Recent Value  SDOH  Interventions   Financial Strain Interventions Intervention Not Indicated      CCM Care Plan  Allergies  Allergen Reactions  . Amlodipine Other (See Comments)    headache  . Tamsulosin Hcl     Medications Reviewed Today    Reviewed by Germaine Pomfret, [facility] (Pharmacist) on 05/01/21 at 1003  Med List Status: <None>  Medication Order Taking? Sig Documenting Provider Last Dose Status Informant  carvedilol (COREG) 6.25 MG tablet [id number]  Take 1 tablet (6.25 mg total) by mouth 2 (two) times daily with a meal. Libby Maw, MD  Active   clopidogrel (PLAVIX) 75 MG tablet [id number]  Take 1 tablet (75 mg total) by mouth daily. [name], MD  Active   donepezil (ARICEPT) 10 MG tablet [id number]  Take 1/2 tablet daily for 2 weeks, then increase to 1 tablet daily [name], MD  Active  ferrous sulfate 325 (65 FE) MG tablet [id number]  Take 325 mg by mouth daily with breakfast. [provider]  Active Spouse/Significant Other  hydrALAZINE (APRESOLINE) 100 MG tablet [id number]  Take 1 tablet (100 mg total) by mouth 3 (three) times daily. [name], MD  Active   losartan (COZAAR) 50 MG tablet [id number]  Take 1 tablet (50 mg total) by mouth daily. Libby Maw, MD  Active   oxybutynin (DITROPAN) 5 MG tablet [id number]  Take 1 tablet (5 mg total) by mouth at bedtime. Libby Maw, MD  Active   vitamin C (ASCORBIC ACID) 500 MG tablet [id number]   [provider]  Active           Patient Active Problem List   Diagnosis Date Noted  . Ceruminosis, left 02/10/2021  . Diastolic congestive heart failure ([place]) 02/03/2021  . Urinary retention   . Mild neurocognitive disorder due to multiple etiologies 12/30/2020  . PAC (premature atrial contraction)   . Stroke   . History of urinary frequency 12/05/2020  . History of gout   . Dysrhythmia   . Decreased appetite   . Laryngeal mass 06/17/2020  . DOE (dyspnea on exertion) 06/17/2020  .  Localized osteoarthritis of left knee 06/11/2020  . LVH (left ventricular hypertrophy) due to hypertensive disease   . Ascending aorta dilation 07/29/2019  . Nonsustained ventricular tachycardia 07/23/2019  . Elevated hemoglobin A1c 06/11/2019  . Corn or callus 06/11/2019  . Bleeding per rectum 12/09/2018  . Hypokalemia 12/09/2018  . Constipation 12/01/2018  . Mass of thyroid region 03/28/2018  . Anemia of chronic disease 03/28/2018  . Edema 07/09/2016  . Major depressive disorder 02/27/2016  . CKD (chronic kidney disease) stage 3, GFR 30-59 ml/min 10/13/2014  . Hepatitis C virus infection cured after antiviral drug therapy 07/05/2014  . Hyperlipidemia 06/29/2014  . Prostate cancer 06/09/2014  . Essential hypertension 06/09/2014  . Onychomycosis 12/01/2013  . PVD (peripheral vascular disease) ([place]) 12/01/2013    Immunization History  Administered Date(s) Administered  . Hepatitis A, Adult 05/01/2018, 01/05/2019  . Hepatitis B, adult 05/01/2018, 06/10/2018, 01/05/2019  . Hepb-cpg 12/05/2020  . Janssen (J&J) SARS-COV-2 Vaccination 02/29/2020, 11/11/2020  . Pneumococcal Polysaccharide-23 10/08/2020    Conditions to be addressed/monitored:  Hypertension, Hyperlipidemia, Chronic Kidney Disease, Overactive Bladder and Peripheral Vascular Disease, Mild cognitive impairment  Care Plan : General Pharmacy (Adult)  Updates made by Germaine Pomfret, RPH since 05/01/2021 12:00 AM    Problem: Hypertension, Hyperlipidemia, Chronic Kidney Disease, Overactive Bladder and Peripheral Vascular Disease, Mild cognitive impairment   Priority: High    Long-Range Goal: Patient-Specific Goal   Start Date: 01/24/2021  Expected End Date: 07/27/2021  This Visit's Progress: On track  Recent Progress: On track  Priority: High  Note:   Current Barriers:  . Unable to achieve control of blood pressure    Pharmacist Clinical Goal(s):  Marland Kitchen Over the next 90 days, patient will achieve control of blood  pressure as evidenced by BP less than 130/80 through collaboration with PharmD and provider.   Interventions: . 1:1 collaboration with Libby Maw, MD regarding development and update of comprehensive plan of care as evidenced by provider attestation and co-signature . Inter-disciplinary care team collaboration (see longitudinal plan of care) . Comprehensive medication review performed; medication list updated in electronic medical record  Hypertension (BP goal <130/80) -Not ideally controlled -Current treatment: . Carvedilol 6.25 mg twice daily  . Furosemide 40 mg daily  . Hydralazine 100 mg  three times daily  . Losartan 50 mg twice daily  -Medications previously tried: Amlodipine   -Home BP Readings: Wrist monitor.  -Weight stable around 165 pounds. Eating less meat.  -Swelling in both ankles. Has been going on for months. Elevates his feet when laying down or sitting. Does have compression  -Counseled to monitor BP at home 2-3 times weekly, document, and provide log at future appointments -Recommended patient purchase new blood pressure monitor to provide more accurate home readings  Hyperlipidemia: (LDL goal < 70) -History of stroke, PVD  -Uncontrolled -Current treatment: . None Current treatment: . Clopidogrel 75 mg daily  -Medications previously tried: NA -Educated on Benefits of statin for ASCVD risk reduction;  -Blood in stool. Once weekly  Thinks related to constipated. Has been happening prior to starting clopidogrel  -Patient is a candidate for High-intensity statin given history of PVD and stroke. Patient prefers to avoid adding additional medication agents at this time.  -Recommended rechecking fasting lipid panel  -Recommend starting atorvastatin 40 mg daily for secondary prevention of stroke.   Overactive Bladder (Goal: maintain symptom relief of urinary medications) -Controlled -Current treatment  . Oxybutynin 5 mg nightly  -Medications previously  tried: NA  -Recommended to continue current medication  Mild Cognitive Impairment (Goal: Prevent decline in memory) -Controlled, per patient -Current treatment  . Donepezil 10 mg daily  -Medications previously tried: NA - Patient feels his memory has been stable at this time. We discussed the possibility of Donepezil 5 mg daily to prevent or slow the progression of his cognitive impairment. Patient prefers to avoid adding additional medication agents at this time.   -Counseled on diet and exercise extensively  Chronic Kidney Disease Stage 4  -All medications assessed for renal dosing and appropriateness in chronic kidney disease. -Recommended to continue current medication   Patient Goals/Self-Care Activities . Over the next 90 days, patient will:  - check blood pressure 2-3 times weekly, document, and provide at future appointments  Follow Up Plan: Telephone follow up appointment with care management team member scheduled for:  08/28/2021 at 9:00 AM      Medication Assistance: None required.  Patient affirms current coverage meets needs.  Patient's preferred pharmacy is:  [facility] - [place], [address], [address] [place], [place] [address] Phone: [phone] Fax: [facility] #[id number] - [address] AT [facility] [place] [address] Phone: [phone] Fax: [phone]  Uses pill box? Yes Pt endorses 100% compliance, although he has expressed interest in weaning off medications in favor of herbal supplements   We discussed: Current pharmacy is preferred with insurance plan and patient is satisfied with pharmacy services Patient decided to: Continue current medication management strategy  Care Plan and Follow Up Patient Decision:  Patient agrees to Care Plan and Follow-up.  Plan: Telephone follow up appointment with  care management team member scheduled for:  08/28/2021 at 9:00 AM  [place] Pharmacist [facility]  [phone]

## 2021-05-01 NOTE — Patient Instructions (Signed)
Visit Information It was great speaking with you today!  Please let me know if you have any questions about our visit.  Goals Addressed            This Visit's Progress   . Track and Manage My Blood Pressure-Hypertension   On track    Timeframe:  Long-Range Goal Priority:  High Start Date:  01/24/2021                           Expected End Date: 10/26/2021                      Follow Up Date 06/24/2021    - purchase a new blood pressure monitor  - check blood pressure 3 times per week - write blood pressure results in a log or diary    Why is this important?    You won't feel high blood pressure, but it can still hurt your blood vessels.   High blood pressure can cause heart or kidney problems. It can also cause a stroke.   Making lifestyle changes like losing a little weight or eating less salt will help.   Checking your blood pressure at home and at different times of the day can help to control blood pressure.   If the doctor prescribes medicine remember to take it the way the doctor ordered.   Call the office if you cannot afford the medicine or if there are questions about it.     Notes:        Patient Care Plan: General Pharmacy (Adult)    Problem Identified: Hypertension, Hyperlipidemia, Chronic Kidney Disease, Overactive Bladder and Peripheral Vascular Disease, Mild cognitive impairment   Priority: High    Long-Range Goal: Patient-Specific Goal   Start Date: 01/24/2021  Expected End Date: 07/27/2021  This Visit's Progress: On track  Recent Progress: On track  Priority: High  Note:   Current Barriers:  . Unable to achieve control of blood pressure    Pharmacist Clinical Goal(s):  Marland Kitchen Over the next 90 days, patient will achieve control of blood pressure as evidenced by BP less than 130/80 through collaboration with PharmD and provider.   Interventions: . 1:1 collaboration with Libby Maw, MD regarding development and update of comprehensive plan of  care as evidenced by provider attestation and co-signature . Inter-disciplinary care team collaboration (see longitudinal plan of care) . Comprehensive medication review performed; medication list updated in electronic medical record  Hypertension (BP goal <130/80) -Not ideally controlled -Current treatment: . Carvedilol 6.25 mg twice daily  . Furosemide 40 mg daily  . Hydralazine 100 mg three times daily  . Losartan 50 mg twice daily  -Medications previously tried: Amlodipine   -Home BP Readings: Wrist monitor.  -Weight stable around 165 pounds. Eating less meat.  -Swelling in both ankles. Has been going on for months. Elevates his feet when laying down or sitting. Does have compression  -Counseled to monitor BP at home 2-3 times weekly, document, and provide log at future appointments -Recommended patient purchase new blood pressure monitor to provide more accurate home readings  Hyperlipidemia: (LDL goal < 70) -History of stroke, PVD  -Uncontrolled -Current treatment: . None Current treatment: . Clopidogrel 75 mg daily  -Medications previously tried: NA -Educated on Benefits of statin for ASCVD risk reduction;  -Blood in stool. Once weekly  Thinks related to constipated. Has been happening prior to starting clopidogrel  -Patient is  a candidate for High-intensity statin given history of PVD and stroke. Patient prefers to avoid adding additional medication agents at this time.  -Recommended rechecking fasting lipid panel  -Recommend starting atorvastatin 40 mg daily for secondary prevention of stroke.   Overactive Bladder (Goal: maintain symptom relief of urinary medications) -Controlled -Current treatment  . Oxybutynin 5 mg nightly  -Medications previously tried: NA  -Recommended to continue current medication  Mild Cognitive Impairment (Goal: Prevent decline in memory) -Controlled, per patient -Current treatment  . Donepezil 10 mg daily  -Medications previously tried:  NA - Patient feels his memory has been stable at this time. We discussed the possibility of Donepezil 5 mg daily to prevent or slow the progression of his cognitive impairment. Patient prefers to avoid adding additional medication agents at this time.   -Counseled on diet and exercise extensively  Chronic Kidney Disease Stage 4  -All medications assessed for renal dosing and appropriateness in chronic kidney disease. -Recommended to continue current medication   Patient Goals/Self-Care Activities . Over the next 90 days, patient will:  - check blood pressure 2-3 times weekly, document, and provide at future appointments  Follow Up Plan: Telephone follow up appointment with care management team member scheduled for:  08/28/2021 at 9:00 AM      Patient agreed to services and verbal consent obtained.   The patient verbalized understanding of instructions, educational materials, and care plan provided today and declined offer to receive copy of patient instructions, educational materials, and care plan.   Junius Argyle, PharmD, CPP Clinical Pharmacist [facility] at [facility]  [phone]

## 2021-05-03 NOTE — Telephone Encounter (Signed)
Patient has appointment to see Dr. Gena Fray for evaluation

## 2021-05-05 ENCOUNTER — Encounter (HOSPITAL_COMMUNITY): Payer: Self-pay

## 2021-05-05 ENCOUNTER — Inpatient Hospital Stay (HOSPITAL_COMMUNITY): Payer: Medicare Other

## 2021-05-05 ENCOUNTER — Other Ambulatory Visit: Payer: Self-pay

## 2021-05-05 ENCOUNTER — Emergency Department (HOSPITAL_COMMUNITY): Payer: Medicare Other

## 2021-05-05 ENCOUNTER — Ambulatory Visit: Payer: Medicare Other | Admitting: Family Medicine

## 2021-05-05 ENCOUNTER — Telehealth: Payer: Self-pay | Admitting: Family Medicine

## 2021-05-05 ENCOUNTER — Inpatient Hospital Stay (HOSPITAL_COMMUNITY)
Admission: EM | Admit: 2021-05-05 | Discharge: 2021-05-09 | DRG: 689 | Disposition: A | Discharge status: Home / Self Care | Payer: Medicare Other | Attending: Internal Medicine | Admitting: Internal Medicine

## 2021-05-05 DIAGNOSIS — I77810 Thoracic aortic ectasia: Secondary | ICD-10-CM | POA: Diagnosis not present

## 2021-05-05 DIAGNOSIS — R5381 Other malaise: Secondary | ICD-10-CM | POA: Diagnosis present

## 2021-05-05 DIAGNOSIS — E1122 Type 2 diabetes mellitus with diabetic chronic kidney disease: Secondary | ICD-10-CM | POA: Diagnosis present

## 2021-05-05 DIAGNOSIS — E1151 Type 2 diabetes mellitus with diabetic peripheral angiopathy without gangrene: Secondary | ICD-10-CM | POA: Diagnosis not present

## 2021-05-05 DIAGNOSIS — Z20822 Contact with and (suspected) exposure to covid-19: Secondary | ICD-10-CM | POA: Diagnosis present

## 2021-05-05 DIAGNOSIS — N390 Urinary tract infection, site not specified: Secondary | ICD-10-CM | POA: Diagnosis not present

## 2021-05-05 DIAGNOSIS — N1832 Chronic kidney disease, stage 3b: Secondary | ICD-10-CM | POA: Diagnosis not present

## 2021-05-05 DIAGNOSIS — B9620 Unspecified Escherichia coli [E. coli] as the cause of diseases classified elsewhere: Secondary | ICD-10-CM | POA: Diagnosis present

## 2021-05-05 DIAGNOSIS — Z79899 Other long term (current) drug therapy: Secondary | ICD-10-CM

## 2021-05-05 DIAGNOSIS — I160 Hypertensive urgency: Secondary | ICD-10-CM | POA: Diagnosis not present

## 2021-05-05 DIAGNOSIS — Z0289 Encounter for other administrative examinations: Secondary | ICD-10-CM

## 2021-05-05 DIAGNOSIS — I5032 Chronic diastolic (congestive) heart failure: Secondary | ICD-10-CM | POA: Diagnosis not present

## 2021-05-05 DIAGNOSIS — I10 Essential (primary) hypertension: Secondary | ICD-10-CM | POA: Diagnosis not present

## 2021-05-05 DIAGNOSIS — Z8673 Personal history of transient ischemic attack (TIA), and cerebral infarction without residual deficits: Secondary | ICD-10-CM | POA: Diagnosis not present

## 2021-05-05 DIAGNOSIS — G9340 Encephalopathy, unspecified: Secondary | ICD-10-CM | POA: Diagnosis not present

## 2021-05-05 DIAGNOSIS — I493 Ventricular premature depolarization: Secondary | ICD-10-CM | POA: Diagnosis not present

## 2021-05-05 DIAGNOSIS — E869 Volume depletion, unspecified: Secondary | ICD-10-CM | POA: Diagnosis not present

## 2021-05-05 DIAGNOSIS — M1712 Unilateral primary osteoarthritis, left knee: Secondary | ICD-10-CM | POA: Diagnosis not present

## 2021-05-05 DIAGNOSIS — G9341 Metabolic encephalopathy: Secondary | ICD-10-CM | POA: Diagnosis present

## 2021-05-05 DIAGNOSIS — Z8546 Personal history of malignant neoplasm of prostate: Secondary | ICD-10-CM

## 2021-05-05 DIAGNOSIS — I130 Hypertensive heart and chronic kidney disease with heart failure and stage 1 through stage 4 chronic kidney disease, or unspecified chronic kidney disease: Secondary | ICD-10-CM | POA: Diagnosis present

## 2021-05-05 DIAGNOSIS — R410 Disorientation, unspecified: Secondary | ICD-10-CM | POA: Diagnosis not present

## 2021-05-05 DIAGNOSIS — Z8249 Family history of ischemic heart disease and other diseases of the circulatory system: Secondary | ICD-10-CM

## 2021-05-05 DIAGNOSIS — E876 Hypokalemia: Secondary | ICD-10-CM | POA: Diagnosis present

## 2021-05-05 DIAGNOSIS — Z833 Family history of diabetes mellitus: Secondary | ICD-10-CM

## 2021-05-05 DIAGNOSIS — Z7902 Long term (current) use of antithrombotics/antiplatelets: Secondary | ICD-10-CM

## 2021-05-05 DIAGNOSIS — G319 Degenerative disease of nervous system, unspecified: Secondary | ICD-10-CM | POA: Diagnosis not present

## 2021-05-05 DIAGNOSIS — Z823 Family history of stroke: Secondary | ICD-10-CM

## 2021-05-05 DIAGNOSIS — E785 Hyperlipidemia, unspecified: Secondary | ICD-10-CM | POA: Diagnosis not present

## 2021-05-05 DIAGNOSIS — Z9079 Acquired absence of other genital organ(s): Secondary | ICD-10-CM

## 2021-05-05 DIAGNOSIS — Z7982 Long term (current) use of aspirin: Secondary | ICD-10-CM

## 2021-05-05 DIAGNOSIS — Z8049 Family history of malignant neoplasm of other genital organs: Secondary | ICD-10-CM

## 2021-05-05 DIAGNOSIS — Z888 Allergy status to other drugs, medicaments and biological substances status: Secondary | ICD-10-CM

## 2021-05-05 DIAGNOSIS — I639 Cerebral infarction, unspecified: Secondary | ICD-10-CM | POA: Diagnosis not present

## 2021-05-05 DIAGNOSIS — N179 Acute kidney failure, unspecified: Secondary | ICD-10-CM | POA: Diagnosis not present

## 2021-05-05 DIAGNOSIS — D631 Anemia in chronic kidney disease: Secondary | ICD-10-CM | POA: Diagnosis present

## 2021-05-05 DIAGNOSIS — Z9114 Patient's other noncompliance with medication regimen: Secondary | ICD-10-CM

## 2021-05-05 DIAGNOSIS — N3000 Acute cystitis without hematuria: Principal | ICD-10-CM | POA: Diagnosis present

## 2021-05-05 DIAGNOSIS — Z87891 Personal history of nicotine dependence: Secondary | ICD-10-CM

## 2021-05-05 DIAGNOSIS — R4182 Altered mental status, unspecified: Secondary | ICD-10-CM

## 2021-05-05 DIAGNOSIS — Z8619 Personal history of other infectious and parasitic diseases: Secondary | ICD-10-CM

## 2021-05-05 LAB — URINALYSIS, ROUTINE W REFLEX MICROSCOPIC
Bilirubin Urine: NEGATIVE
Glucose, UA: 50 mg/dL — AB
Hgb urine dipstick: NEGATIVE
Ketones, ur: NEGATIVE mg/dL
Nitrite: NEGATIVE
Protein, ur: 30 mg/dL — AB
Specific Gravity, Urine: 1.009 (ref 1.005–1.030)
pH: 7 (ref 5.0–8.0)

## 2021-05-05 LAB — CBC WITH DIFFERENTIAL/PLATELET
Abs Immature Granulocytes: 0.01 10*3/uL (ref 0.00–0.07)
Basophils Absolute: 0 10*3/uL (ref 0.0–0.1)
Basophils Relative: 1 %
Eosinophils Absolute: 0 10*3/uL (ref 0.0–0.5)
Eosinophils Relative: 1 %
HCT: 31 % — ABNORMAL LOW (ref 39.0–52.0)
Hemoglobin: 10.3 g/dL — ABNORMAL LOW (ref 13.0–17.0)
Immature Granulocytes: 0 %
Lymphocytes Relative: 10 %
Lymphs Abs: 0.6 10*3/uL — ABNORMAL LOW (ref 0.7–4.0)
MCH: 32 pg (ref 26.0–34.0)
MCHC: 33.2 g/dL (ref 30.0–36.0)
MCV: 96.3 fL (ref 80.0–100.0)
Monocytes Absolute: 0.5 10*3/uL (ref 0.1–1.0)
Monocytes Relative: 8 %
Neutro Abs: 5 10*3/uL (ref 1.7–7.7)
Neutrophils Relative %: 80 %
Platelets: 174 10*3/uL (ref 150–400)
RBC: 3.22 MIL/uL — ABNORMAL LOW (ref 4.22–5.81)
RDW: 12.9 % (ref 11.5–15.5)
WBC: 6.2 10*3/uL (ref 4.0–10.5)
nRBC: 0 % (ref 0.0–0.2)

## 2021-05-05 LAB — RAPID URINE DRUG SCREEN, HOSP PERFORMED
Amphetamines: NOT DETECTED
Barbiturates: NOT DETECTED
Benzodiazepines: NOT DETECTED
Cocaine: NOT DETECTED
Opiates: NOT DETECTED
Tetrahydrocannabinol: NOT DETECTED

## 2021-05-05 LAB — COMPREHENSIVE METABOLIC PANEL
ALT: 11 U/L (ref 0–44)
AST: 22 U/L (ref 15–41)
Albumin: 4 g/dL (ref 3.5–5.0)
Alkaline Phosphatase: 41 U/L (ref 38–126)
Anion gap: 10 (ref 5–15)
BUN: 53 mg/dL — ABNORMAL HIGH (ref 8–23)
CO2: 27 mmol/L (ref 22–32)
Calcium: 10.6 mg/dL — ABNORMAL HIGH (ref 8.9–10.3)
Chloride: 103 mmol/L (ref 98–111)
Creatinine, Ser: 3.35 mg/dL — ABNORMAL HIGH (ref 0.61–1.24)
GFR, Estimated: 19 mL/min — ABNORMAL LOW (ref >60)
Glucose, Bld: 104 mg/dL — ABNORMAL HIGH (ref 70–99)
Potassium: 3.2 mmol/L — ABNORMAL LOW (ref 3.5–5.1)
Sodium: 140 mmol/L (ref 135–145)
Total Bilirubin: 0.7 mg/dL (ref 0.3–1.2)
Total Protein: 7.4 g/dL (ref 6.5–8.1)

## 2021-05-05 LAB — MAGNESIUM: Magnesium: 2.1 mg/dL (ref 1.7–2.4)

## 2021-05-05 LAB — RESP PANEL BY RT-PCR (FLU A&B, COVID) ARPGX2
Influenza A by PCR: NEGATIVE
Influenza B by PCR: NEGATIVE
SARS Coronavirus 2 by RT PCR: NEGATIVE

## 2021-05-05 LAB — PROTIME-INR
INR: 1 (ref 0.8–1.2)
Prothrombin Time: 13.1 seconds (ref 11.4–15.2)

## 2021-05-05 LAB — ETHANOL: Alcohol, Ethyl (B): <10 mg/dL (ref <10)

## 2021-05-05 LAB — APTT: aPTT: 31 seconds (ref 24–36)

## 2021-05-05 LAB — LACTIC ACID, PLASMA: Lactic Acid, Venous: 1.1 mmol/L (ref 0.5–1.9)

## 2021-05-05 MED ORDER — LACTATED RINGERS IV SOLN
INTRAVENOUS | Status: DC
  Administered 2021-05-06 – 2021-05-07 (×2): 100 mL via INTRAVENOUS

## 2021-05-05 MED ORDER — DONEPEZIL HCL 10 MG PO TABS
10.0000 mg | ORAL_TABLET | Freq: Every day | ORAL | Status: DC
  Administered 2021-05-06 – 2021-05-09 (×4): 10 mg via ORAL
  Filled 2021-05-05 (×4): qty 1

## 2021-05-05 MED ORDER — ACETAMINOPHEN 650 MG RE SUPP: 650.0000 mg | Freq: Four times a day (QID) | RECTAL | Status: DC | PRN

## 2021-05-05 MED ORDER — HYDRALAZINE HCL 25 MG PO TABS
100.0000 mg | ORAL_TABLET | Freq: Once | ORAL | Status: AC
  Administered 2021-05-05: 100 mg via ORAL
  Filled 2021-05-05: qty 4

## 2021-05-05 MED ORDER — CARVEDILOL 12.5 MG PO TABS
25.0000 mg | ORAL_TABLET | Freq: Two times a day (BID) | ORAL | Status: DC
  Administered 2021-05-05: 25 mg via ORAL
  Filled 2021-05-05: qty 2

## 2021-05-05 MED ORDER — SODIUM CHLORIDE 0.9 % IV SOLN
1.0000 g | Freq: Once | INTRAVENOUS | Status: AC
  Administered 2021-05-05: 1 g via INTRAVENOUS
  Filled 2021-05-05: qty 10

## 2021-05-05 MED ORDER — SODIUM CHLORIDE 0.9 % IV SOLN
1.0000 g | INTRAVENOUS | Status: DC
  Administered 2021-05-06 – 2021-05-07 (×2): 1 g via INTRAVENOUS
  Filled 2021-05-05: qty 10
  Filled 2021-05-05: qty 1

## 2021-05-05 MED ORDER — ACETAMINOPHEN 325 MG PO TABS
650.0000 mg | ORAL_TABLET | Freq: Four times a day (QID) | ORAL | Status: DC | PRN
  Administered 2021-05-08 – 2021-05-09 (×2): 650 mg via ORAL
  Filled 2021-05-05 (×2): qty 2

## 2021-05-05 MED ORDER — FERROUS SULFATE 325 (65 FE) MG PO TABS
325.0000 mg | ORAL_TABLET | Freq: Every day | ORAL | Status: DC
  Administered 2021-05-06 – 2021-05-09 (×4): 325 mg via ORAL
  Filled 2021-05-05 (×4): qty 1

## 2021-05-05 MED ORDER — ASPIRIN EC 81 MG PO TBEC
81.0000 mg | DELAYED_RELEASE_TABLET | Freq: Every day | ORAL | Status: DC
  Administered 2021-05-06 – 2021-05-09 (×4): 81 mg via ORAL
  Filled 2021-05-05 (×4): qty 1

## 2021-05-05 MED ORDER — CARVEDILOL 6.25 MG PO TABS
6.2500 mg | ORAL_TABLET | Freq: Two times a day (BID) | ORAL | Status: DC
  Administered 2021-05-05 – 2021-05-09 (×8): 6.25 mg via ORAL
  Filled 2021-05-05 (×4): qty 1
  Filled 2021-05-05: qty 2
  Filled 2021-05-05 (×2): qty 1
  Filled 2021-05-05: qty 2

## 2021-05-05 MED ORDER — HYDRALAZINE HCL 25 MG PO TABS
50.0000 mg | ORAL_TABLET | Freq: Three times a day (TID) | ORAL | Status: DC
  Administered 2021-05-05 – 2021-05-09 (×9): 50 mg via ORAL
  Filled 2021-05-05 (×9): qty 2

## 2021-05-05 MED ORDER — SODIUM CHLORIDE 0.9 % IV SOLN: Freq: Once | INTRAVENOUS | Status: DC

## 2021-05-05 MED ORDER — CLOPIDOGREL BISULFATE 75 MG PO TABS
75.0000 mg | ORAL_TABLET | Freq: Every day | ORAL | Status: DC
  Administered 2021-05-06 – 2021-05-09 (×4): 75 mg via ORAL
  Filled 2021-05-05 (×4): qty 1

## 2021-05-05 NOTE — Progress Notes (Signed)
Responded to consult for IV. Pt out of room.
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
## 2021-05-05 NOTE — ED Notes (Signed)
Patient transported to MRI 

## 2021-05-05 NOTE — ED Notes (Signed)
At MRI 

## 2021-05-05 NOTE — ED Notes (Signed)
Patient returned from MRI.

## 2021-05-05 NOTE — ED Notes (Signed)
Patient returned from MRI. IV team at bedside.
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
## 2021-05-05 NOTE — ED Notes (Signed)
Multiple unsuccessful attempts by ED staff for IV access. IV team consulted at this time.
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
## 2021-05-05 NOTE — ED Notes (Signed)
Attempted IV placement x2 unsuccessfully. Pt requests not to be stuck in [facility].

## 2021-05-05 NOTE — ED Notes (Signed)
Per MRI, they will transport pt within 30 minutes for scan.

## 2021-05-05 NOTE — ED Notes (Signed)
Shawn, Dustin notified of patient's condition.

## 2021-05-05 NOTE — ED Notes (Signed)
Per MRI, they will transport pt within 10 minutes to scan.

## 2021-05-05 NOTE — H&P (Signed)
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 Lasix for now, with close ensuing volume status as well as renal function to assist with determination of timing versus indication of resumption of home Lasix.  Plan: Hold home Lasix for now, as above.  Monitor strict I's and O's and daily weights.  Add on serum magnesium level.      #) Essential hypertension: Outpatient antihypertensive regimen includes Coreg, Lasix, hydralazine, and losartan.  The patient is with the patient takes a lower dose of hydralazine than that which is prescribed for him, as further quantified above.    Plan: Hold home Lasix for now presenting AKI.  We will hold losartan as well based upon the same rationale.  Ordered resumption of home Coreg and hydralazine starting tomorrow morning, with close  monitoring of ensuing blood pressure via routine vital signs.      #) Urinary incontinence: On oxybutynin as an outpatient.  In the setting of presenting acute encephalopathy as well as acute kidney injury, will hold home  oxybutynin for now to contribution to patient's altered mental status as a result of anticholinergic side effects of this medication as well as to reduce risk for post renal contributions to presenting AKI in the setting of associated risk for urinary retention as result of this medication.  Plan: Hold home oxybutynin for now.  Monitor strict I's and O's and daily weights.  Repeat BMP in the morning.     DVT prophylaxis: SCDs Code Status: Full code Family Communication: The patient's case was discussed with his wife, who was present at bedside Disposition Plan: Per Rounding Team Consults called: none  Admission status: Inpatient; med telemetry     Of note, this patient was added by me to the following Admit List/Treatment Team: [email or web address].      PLEASE NOTE THAT DRAGON DICTATION SOFTWARE WAS USED IN THE CONSTRUCTION OF THIS NOTE.   [place] [facility] Pager [phone] From [place]  Otherwise, please contact night-coverage  [email or web address] Password [facility]   05/05/2021, 7:38 PM
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
## 2021-05-06 DIAGNOSIS — N179 Acute kidney failure, unspecified: Secondary | ICD-10-CM | POA: Diagnosis present

## 2021-05-06 DIAGNOSIS — I639 Cerebral infarction, unspecified: Secondary | ICD-10-CM

## 2021-05-06 DIAGNOSIS — N3000 Acute cystitis without hematuria: Secondary | ICD-10-CM | POA: Diagnosis present

## 2021-05-06 LAB — CBC WITH DIFFERENTIAL/PLATELET
Abs Immature Granulocytes: 0.01 10*3/uL (ref 0.00–0.07)
Basophils Absolute: 0 10*3/uL (ref 0.0–0.1)
Basophils Relative: 1 %
Eosinophils Absolute: 0.1 10*3/uL (ref 0.0–0.5)
Eosinophils Relative: 2 %
HCT: 28.4 % — ABNORMAL LOW (ref 39.0–52.0)
Hemoglobin: 9.3 g/dL — ABNORMAL LOW (ref 13.0–17.0)
Immature Granulocytes: 0 %
Lymphocytes Relative: 14 %
Lymphs Abs: 0.7 10*3/uL (ref 0.7–4.0)
MCH: 32.1 pg (ref 26.0–34.0)
MCHC: 32.7 g/dL (ref 30.0–36.0)
MCV: 97.9 fL (ref 80.0–100.0)
Monocytes Absolute: 0.5 10*3/uL (ref 0.1–1.0)
Monocytes Relative: 10 %
Neutro Abs: 4 10*3/uL (ref 1.7–7.7)
Neutrophils Relative %: 73 %
Platelets: 144 10*3/uL — ABNORMAL LOW (ref 150–400)
RBC: 2.9 MIL/uL — ABNORMAL LOW (ref 4.22–5.81)
RDW: 13 % (ref 11.5–15.5)
WBC: 5.4 10*3/uL (ref 4.0–10.5)
nRBC: 0 % (ref 0.0–0.2)

## 2021-05-06 LAB — COMPREHENSIVE METABOLIC PANEL
ALT: 10 U/L (ref 0–44)
AST: 18 U/L (ref 15–41)
Albumin: 3.7 g/dL (ref 3.5–5.0)
Alkaline Phosphatase: 38 U/L (ref 38–126)
Anion gap: 10 (ref 5–15)
BUN: 47 mg/dL — ABNORMAL HIGH (ref 8–23)
CO2: 29 mmol/L (ref 22–32)
Calcium: 10.3 mg/dL (ref 8.9–10.3)
Chloride: 102 mmol/L (ref 98–111)
Creatinine, Ser: 2.71 mg/dL — ABNORMAL HIGH (ref 0.61–1.24)
GFR, Estimated: 24 mL/min — ABNORMAL LOW (ref >60)
Glucose, Bld: 101 mg/dL — ABNORMAL HIGH (ref 70–99)
Potassium: 3.4 mmol/L — ABNORMAL LOW (ref 3.5–5.1)
Sodium: 141 mmol/L (ref 135–145)
Total Bilirubin: 0.7 mg/dL (ref 0.3–1.2)
Total Protein: 6.8 g/dL (ref 6.5–8.1)

## 2021-05-06 LAB — TSH: TSH: 2.944 u[IU]/mL (ref 0.350–4.500)

## 2021-05-06 LAB — LIPID PANEL
Cholesterol: 154 mg/dL (ref 0–200)
HDL: 46 mg/dL (ref >40)
LDL Cholesterol: 101 mg/dL — ABNORMAL HIGH (ref 0–99)
Total CHOL/HDL Ratio: 3.3 RATIO
Triglycerides: 33 mg/dL (ref <150)
VLDL: 7 mg/dL (ref 0–40)

## 2021-05-06 LAB — BLOOD GAS, VENOUS
Acid-Base Excess: 4.7 mmol/L — ABNORMAL HIGH (ref 0.0–2.0)
Bicarbonate: 29.5 mmol/L — ABNORMAL HIGH (ref 20.0–28.0)
FIO2: 21
O2 Saturation: 76.6 %
Patient temperature: 98.6
pCO2, Ven: 47 mmHg (ref 44.0–60.0)
pH, Ven: 7.414 (ref 7.250–7.430)
pO2, Ven: 44.9 mmHg (ref 32.0–45.0)

## 2021-05-06 LAB — AMMONIA: Ammonia: 11 umol/L (ref 9–35)

## 2021-05-06 LAB — SODIUM, URINE, RANDOM: Sodium, Ur: 74 mmol/L

## 2021-05-06 LAB — PHOSPHORUS: Phosphorus: 3 mg/dL (ref 2.5–4.6)

## 2021-05-06 LAB — CREATININE, URINE, RANDOM: Creatinine, Urine: 74.05 mg/dL

## 2021-05-06 LAB — MAGNESIUM: Magnesium: 1.7 mg/dL (ref 1.7–2.4)

## 2021-05-06 LAB — BRAIN NATRIURETIC PEPTIDE: B Natriuretic Peptide: 78.4 pg/mL (ref 0.0–100.0)

## 2021-05-06 MED ORDER — ATORVASTATIN CALCIUM 40 MG PO TABS
40.0000 mg | ORAL_TABLET | Freq: Every day | ORAL | Status: DC
  Administered 2021-05-06 – 2021-05-08 (×3): 40 mg via ORAL
  Filled 2021-05-06 (×3): qty 1

## 2021-05-06 NOTE — ED Notes (Signed)
Not able to obtain 2nd set of blood cultures due to pt being a difficult stick.

## 2021-05-06 NOTE — Plan of Care (Signed)
Pt admitted late last night; neuro check negative; pt AAOx4; no s/s of acute distress or pain reported or observed; call light within reach and bed at lowest position for safety.

## 2021-05-06 NOTE — Progress Notes (Signed)
   05/06/21 1702  Provider Notification  Provider Name/Title Dr. Dana Allan  Date Provider Notified 05/06/21  Time Provider Notified 3912  Notification Type Page  Notification Reason Other (Comment) (BP 104/62- give or hold hydralazine?)  Provider response Other (Comment) (Hold Dose)  Date of Provider Response 05/06/21  Time of Provider Response 1702
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
## 2021-05-06 NOTE — ED Notes (Signed)
ED TO INPATIENT HANDOFF REPORT  Name/Age/Gender Selmer Dominion 70 y.o. male  Code Status    Code Status Orders  (From admission, onward)         Start     Ordered   05/05/21 1937  Full code  Continuous        05/05/21 1937        Code Status History    Date Active Date Inactive Code Status Order ID Comments User Context   10/07/2020 2303 10/09/2020 2000 Full Code [id number]  Chotiner, Yevonne Aline, MD ED      Home/SNF/Other Home  Chief Complaint Acute encephalopathy [G93.40]  Level of Care/Admitting Diagnosis ED Disposition    ED Disposition  Admit   Condition  --   [place]: [facility] [[id number]]  Level of Care: Med-Surg [16]  May admit patient to [facility] or [facility] if equivalent level of care is available:: No  Covid Evaluation: Confirmed COVID Negative  Diagnosis: Acute encephalopathy [644034]  Admitting Physician: Rhetta Mura [[id number]]  Attending Physician: Rhetta Mura [[id number]]  Estimated length of stay: past midnight tomorrow  Certification:: I certify this patient will need inpatient services for at least 2 midnights         Medical History Past Medical History:  Diagnosis Date  . Anemia of chronic disease 03/28/2018  . Ascending aorta dilation 07/29/2019   (a) Echo 07/29/19 46mm  . Bleeding per rectum 12/09/2018  . CKD (chronic kidney disease) stage 3, GFR 30-59 ml/min (HCC) 10/13/2014   Discussed with patient importance of good BP control, avoiding nephrotoxic meds/supplements in preventing further progression  . Constipation 12/01/2018  . Corn or callus 06/11/2019  . Decreased appetite   . Diastolic congestive heart failure ([place]) 02/03/2021  . DOE (dyspnea on exertion) 06/17/2020  . Dysrhythmia   . Edema 07/09/2016  . Elevated hemoglobin A1c 06/11/2019  . Essential hypertension 06/09/2014   Overview:  metoprolol caused bradycardia amlodipine causes headache, leg swelling  Last Assessment &  Plan:  We had a long discussion regarding goals of treatment of HTN to prevent worsening cardiac and renal disease.  We also discussed options to change his regimen.  He states that he would prefer to seek a practitioner who can recommend herbal remedies as he feels thhis will be a healthier path f  . Hepatitis C virus infection cured after antiviral drug therapy 07/05/2014  . History of gout   . History of urinary frequency 12/05/2020  . Hyperlipidemia 06/29/2014  . Hypokalemia 12/09/2018  . Laryngeal mass 06/17/2020  . Localized osteoarthritis of left knee 06/11/2020   Left knee injection-06/10/2020  . LVH (left ventricular hypertrophy) due to hypertensive disease    (a) echo 07/29/19 severe concentric LVH  . Major depressive disorder 02/27/2016  . Mass of thyroid region 03/28/2018  . Mild neurocognitive disorder due to multiple etiologies 12/30/2020  . Nonsustained ventricular tachycardia 07/23/2019   (a) Zio monitor 06/2019. Subsequent echo with severe concentric LVH, but normal EF 55-60%  . Onychomycosis 12/01/2013  . PAC (premature atrial contraction)    (a) ZIO 06/2019. Asymptomatic  . Prostate cancer 1997   Dx in 1997, never treated, taking herbal supplements. S/p prostatectomy 2017  . PVD (peripheral vascular disease) 12/01/2013  . Stroke    Per 12/26/20 MRI - remote pontine and bilateral thalamic infarcts   . Urinary retention     Allergies Allergies  Allergen Reactions  . Amlodipine Other (See Comments)    headache  .  Tamsulosin Hcl     IV Location/Drains/Wounds Patient Lines/Drains/Airways Status    Active Line/Drains/Airways    Name Placement date Placement time Site Days   Peripheral IV 05/05/21 22 G 1.75" Anterior;Right Forearm 05/05/21  2152  Forearm  1          Labs/Imaging Results for orders placed or performed during the hospital encounter of 05/05/21 (from the past 48 hour(s))  Resp Panel by RT-PCR (Flu A&B, Covid) Nasopharyngeal Swab     Status: None    Collection Time: 05/05/21  2:30 PM   Specimen: Nasopharyngeal Swab; Nasopharyngeal(NP) swabs in vial transport medium  Result Value Ref Range   SARS Coronavirus 2 by RT PCR NEGATIVE NEGATIVE    Comment: (NOTE) SARS-CoV-2 target nucleic acids are NOT DETECTED.  The SARS-CoV-2 RNA is generally detectable in upper respiratory specimens during the acute phase of infection. The lowest concentration of SARS-CoV-2 viral copies this assay can detect is 138 copies/mL. A negative result does not preclude SARS-Cov-2 infection and should not be used as the sole basis for treatment or other patient management decisions. A negative result may occur with  improper specimen collection/handling, submission of specimen other than nasopharyngeal swab, presence of viral mutation(s) within the areas targeted by this assay, and inadequate number of viral copies(<138 copies/mL). A negative result must be combined with clinical observations, patient history, and epidemiological information. The expected result is Negative.  Fact Sheet for Patients:  EntrepreneurPulse.com.au  Fact Sheet for Healthcare Providers:  IncredibleEmployment.be  This test is no t yet approved or cleared by the [place] FDA and  has been authorized for detection and/or diagnosis of SARS-CoV-2 by FDA under an Emergency Use Authorization (EUA). This EUA will remain  in effect (meaning this test can be used) for the duration of the COVID-19 declaration under Section 564(b)(1) of the Act, 21 U.S.C.section 360bbb-3(b)(1), unless the authorization is terminated  or revoked sooner.       Influenza A by PCR NEGATIVE NEGATIVE   Influenza B by PCR NEGATIVE NEGATIVE    Comment: (NOTE) The Xpert Xpress SARS-CoV-2/FLU/RSV plus assay is intended as an aid in the diagnosis of influenza from Nasopharyngeal swab specimens and should not be used as a sole basis for treatment. Nasal washings  and aspirates are unacceptable for Xpert Xpress SARS-CoV-2/FLU/RSV testing.  Fact Sheet for Patients: EntrepreneurPulse.com.au  Fact Sheet for Healthcare Providers: IncredibleEmployment.be  This test is not yet approved or cleared by the [place] FDA and has been authorized for detection and/or diagnosis of SARS-CoV-2 by FDA under an Emergency Use Authorization (EUA). This EUA will remain in effect (meaning this test can be used) for the duration of the COVID-19 declaration under Section 564(b)(1) of the Act, 21 U.S.C. section 360bbb-3(b)(1), unless the authorization is terminated or revoked.  Performed at [facility], [place] [address], [place], [place] [address]   Urine rapid drug screen (hosp performed)     Status: None   Collection Time: 05/05/21  2:30 PM  Result Value Ref Range   Opiates NONE DETECTED NONE DETECTED   Cocaine NONE DETECTED NONE DETECTED   Benzodiazepines NONE DETECTED NONE DETECTED   Amphetamines NONE DETECTED NONE DETECTED   Tetrahydrocannabinol NONE DETECTED NONE DETECTED   Barbiturates NONE DETECTED NONE DETECTED    Comment: (NOTE) DRUG SCREEN FOR MEDICAL PURPOSES ONLY.  IF CONFIRMATION IS NEEDED FOR ANY PURPOSE, NOTIFY LAB WITHIN 5 DAYS.  LOWEST DETECTABLE LIMITS FOR URINE DRUG SCREEN Drug Class  Cutoff (ng/mL) Amphetamine and metabolites    1000 Barbiturate and metabolites    200 Benzodiazepine                 979 Tricyclics and metabolites     300 Opiates and metabolites        300 Cocaine and metabolites        300 THC                            50 Performed at [facility], [place] [address], [place], [place] [address]   Urinalysis, Routine w reflex microscopic     Status: Abnormal   Collection Time: 05/05/21  2:30 PM  Result Value Ref Range   Color, Urine STRAW (A) YELLOW   APPearance CLEAR CLEAR   Specific Gravity, Urine 1.009 1.005 - 1.030    pH 7.0 5.0 - 8.0   Glucose, UA 50 (A) NEGATIVE mg/dL   Hgb urine dipstick NEGATIVE NEGATIVE   Bilirubin Urine NEGATIVE NEGATIVE   Ketones, ur NEGATIVE NEGATIVE mg/dL   Protein, ur 30 (A) NEGATIVE mg/dL   Nitrite NEGATIVE NEGATIVE   Leukocytes,Ua MODERATE (A) NEGATIVE   RBC / HPF 0-5 0 - 5 RBC/hpf   WBC, UA 21-50 0 - 5 WBC/hpf   Bacteria, UA FEW (A) NONE SEEN   Squamous Epithelial / LPF 0-5 0 - 5    Comment: Performed at [facility], [place] [address], [place], [place] [address]  Creatinine, urine, random     Status: None   Collection Time: 05/05/21  2:30 PM  Result Value Ref Range   Creatinine, Urine 74.05 mg/dL    Comment: Performed at [facility], [place] [address], [place], [place] [address]  Sodium, urine, random     Status: None   Collection Time: 05/05/21  2:30 PM  Result Value Ref Range   Sodium, Ur 74 mmol/L    Comment: Performed at [facility], [place] [address], [place], [place] [address]  Ethanol     Status: None   Collection Time: 05/05/21  2:41 PM  Result Value Ref Range   Alcohol, Ethyl (B) <10 <10 mg/dL    Comment: (NOTE) Lowest detectable limit for serum alcohol is 10 mg/dL.  For medical purposes only. Performed at [facility], [place] [address] [place], [place] [address]   Protime-INR     Status: None   Collection Time: 05/05/21  2:41 PM  Result Value Ref Range   Prothrombin Time 13.1 11.4 - 15.2 seconds   INR 1.0 0.8 - 1.2    Comment: (NOTE) INR goal varies based on device and disease states. Performed at [facility], [place] [address] [place], [place] [address]   APTT     Status: None   Collection Time: 05/05/21  2:41 PM  Result Value Ref Range   aPTT 31 24 - 36 seconds    Comment: Performed at [facility], [place] [address], [place], [place] [address]  Comprehensive metabolic panel     Status: Abnormal   Collection Time: 05/05/21  2:41 PM   Result Value Ref Range   Sodium 140 135 - 145 mmol/L   Potassium 3.2 (L) 3.5 - 5.1 mmol/L   Chloride 103 98 - 111 mmol/L   CO2 27 22 - 32 mmol/L   Glucose, Bld 104 (H) 70 - 99 mg/dL    Comment: Glucose reference  range applies only to samples taken after fasting for at least 8 hours.   BUN 53 (H) 8 - 23 mg/dL   Creatinine, Ser 3.35 (H) 0.61 - 1.24 mg/dL   Calcium 10.6 (H) 8.9 - 10.3 mg/dL   Total Protein 7.4 6.5 - 8.1 g/dL   Albumin 4.0 3.5 - 5.0 g/dL   AST 22 15 - 41 U/L   ALT 11 0 - 44 U/L   Alkaline Phosphatase 41 38 - 126 U/L   Total Bilirubin 0.7 0.3 - 1.2 mg/dL   GFR, Estimated 19 (L) >60 mL/min    Comment: (NOTE) Calculated using the CKD-EPI Creatinine Equation (2021)    Anion gap 10 5 - 15    Comment: Performed at [facility], [place] [address] [place], [place] [address]  CBC WITH DIFFERENTIAL     Status: Abnormal   Collection Time: 05/05/21  2:41 PM  Result Value Ref Range   WBC 6.2 4.0 - 10.5 K/uL   RBC 3.22 (L) 4.22 - 5.81 MIL/uL   Hemoglobin 10.3 (L) 13.0 - 17.0 g/dL   HCT 31.0 (L) 39.0 - 52.0 %   MCV 96.3 80.0 - 100.0 fL   MCH 32.0 26.0 - 34.0 pg   MCHC 33.2 30.0 - 36.0 g/dL   RDW 12.9 11.5 - 15.5 %   Platelets 174 150 - 400 K/uL   nRBC 0.0 0.0 - 0.2 %   Neutrophils Relative % 80 %   Neutro Abs 5.0 1.7 - 7.7 K/uL   Lymphocytes Relative 10 %   Lymphs Abs 0.6 (L) 0.7 - 4.0 K/uL   Monocytes Relative 8 %   Monocytes Absolute 0.5 0.1 - 1.0 K/uL   Eosinophils Relative 1 %   Eosinophils Absolute 0.0 0.0 - 0.5 K/uL   Basophils Relative 1 %   Basophils Absolute 0.0 0.0 - 0.1 K/uL   Immature Granulocytes 0 %   Abs Immature Granulocytes 0.01 0.00 - 0.07 K/uL    Comment: Performed at [facility], [place] [address] [place] [address], [place], [place] [address]  Magnesium     Status: None   Collection Time: 05/05/21  9:50 PM  Result Value Ref Range   Magnesium 2.1 1.7 - 2.4 mg/dL    Comment: Performed at [facility], [place]  [address] [place], [place] [address]  TSH     Status: None   Collection Time: 05/05/21  9:50 PM  Result Value Ref Range   TSH 2.944 0.350 - 4.500 uIU/mL    Comment: Performed by a 3rd Generation assay with a functional sensitivity of <=0.01 uIU/mL. Performed at [facility], [place] [address] [place], [place] [address]   Lactic acid, plasma     Status: None   Collection Time: 05/05/21 10:00 PM  Result Value Ref Range   Lactic Acid, Venous 1.1 0.5 - 1.9 mmol/L    Comment: Performed at [facility], [place], [place] [address], [place], [place] [address]   CT Head Wo Contrast  Result Date: 05/05/2021 CLINICAL DATA:  Mental status changes. EXAM: CT HEAD WITHOUT CONTRAST TECHNIQUE: Contiguous axial images were obtained from the base of the skull through the vertex without intravenous contrast. COMPARISON:  12/26/2020 FINDINGS: Brain: There is no evidence for acute hemorrhage, hydrocephalus, mass lesion, or abnormal extra-axial fluid collection. No definite CT evidence for acute infarction. Stable appearance of lacunar infarcts in the thalamic nuclei bilaterally. Vascular: No hyperdense vessel or unexpected calcification. Skull: No evidence for fracture. No worrisome  lytic or sclerotic lesion. Sinuses/Orbits: The visualized paranasal sinuses and mastoid air cells are clear. Visualized portions of the globes and intraorbital fat are unremarkable. Other: 2.2 cm subcutaneous lesion left occipital region is stable, likely a sebaceous cyst. IMPRESSION: Stable.  No acute intracranial abnormality. Electronically Signed   By: Misty Stanley M.D.   On: 05/05/2021 15:10   MR ANGIO HEAD WO CONTRAST  Result Date: 05/05/2021 CLINICAL DATA:  Stroke, follow-up EXAM: MRA HEAD WITHOUT CONTRAST TECHNIQUE: Angiographic images of the Circle of Willis were acquired using MRA technique without intravenous contrast. COMPARISON:  04/30/2021 FINDINGS: Anterior circulation: Intracranial internal carotid  arteries are patent. Anterior and middle cerebral arteries are patent. Posterior circulation: Partially imaged intracranial vertebral arteries are patent. Basilar artery is patent. Posterior cerebral arteries are patent. Bilateral posterior communicating arteries are present. Other: None. IMPRESSION: No proximal intracranial vessel occlusion. No change from recent prior study. Electronically Signed   By: Macy Mis M.D.   On: 05/05/2021 19:07   MR BRAIN WO CONTRAST  Result Date: 05/05/2021 CLINICAL DATA:  Initial evaluation for acute TIA, abrupt onset confusion. EXAM: MRI HEAD WITHOUT CONTRAST TECHNIQUE: Multiplanar, multiecho pulse sequences of the brain and surrounding structures were obtained without intravenous contrast. COMPARISON:  Prior CT from earlier the same day as well as recent MRI from 04/15/2020. FINDINGS: Brain: Generalized age-related cerebral atrophy. Patchy and confluent T2/FLAIR hyperintensity seen within the periventricular and deep white matter both cerebral hemispheres as well as the pons, most consistent with chronic small vessel ischemic disease. Multiple scattered superimposed remote lacunar infarcts present about the hemispheric cerebral white matter, deep gray nuclei, and pons. Mild residual diffusion abnormality seen about the recently identified too small subacute infarcts that were previously seen on recent MRI from 04/15/2021 (series 5, images 84, 68). Now seen are several additional new subcentimeter foci of diffusion abnormality involving the splenium (series 5, image 75), left posterior periatrial and subcortical white matter (series 5, images 75, 69). These foci measure up to approximately 4 mm. No associated hemorrhage or mass effect. Multiple additional apparent foci of subtle diffusion abnormality about the basal ganglia and thalami favored to likely be related to adjacent susceptibility artifact, similar to previous. Otherwise, gray-white matter differentiation  maintained. No other areas of remote cortical infarction. No evidence for acute intracranial hemorrhage. Again seen are innumerable scattered chronic micro hemorrhages clustered about the cerebellum and deep gray nuclei, with most pronounced involvement about the thalami. Findings consistent with chronic microhemorrhages, and most likely related to chronic poorly controlled hypertension given distribution. No mass lesion, midline shift or mass effect. Ventricles normal size without hydrocephalus. No extra-axial fluid collection. Pituitary gland suprasellar region normal. Midline structures intact. Vascular: Major intracranial vascular flow voids are maintained. Skull and upper cervical spine: Craniocervical junction within normal limits. Bone marrow signal intensity normal. 2.2 cm epidermal inclusion cyst noted at the left suboccipital region. Scalp soft tissues otherwise unremarkable. Sinuses/Orbits: Globes and orbital soft tissues demonstrate no acute finding. Scattered mucosal thickening noted within the ethmoidal air cells and maxillary sinuses. Paranasal sinuses are otherwise clear. No mastoid effusion. Inner ear structures grossly normal. Other: None. IMPRESSION: 1. Few new subcentimeter foci of diffusion abnormality involving the splenium as well as the left posterior periatrial and subcortical white matter as above, suspicious for acute to subacute small vessel type infarcts. No associated hemorrhage or mass effect. 2. Mild residual diffusion abnormality about the recently identified two small subacute infarcts that were previously seen on recent MRI from 04/15/2021. 3. Underlying  age-related cerebral atrophy with chronic microvascular ischemic disease with multiple scattered remote lacunar infarcts involving the hemispheric cerebral white matter, deep gray nuclei, and pons. 4. Innumerable chronic micro hemorrhages clustered about the cerebellum and deep gray nuclei, most consistent with chronic poorly  controlled hypertension. Electronically Signed   By: Jeannine Boga M.D.   On: 05/05/2021 21:46   DG CHEST PORT 1 VIEW  Result Date: 05/05/2021 CLINICAL DATA:  Acute encephalopathy EXAM: PORTABLE CHEST 1 VIEW COMPARISON:  10/07/2020 FINDINGS: Heart is borderline in size. Lungs clear. No effusions or edema. No acute bony abnormality. IMPRESSION: No active disease. Electronically Signed   By: Rolm Baptise M.D.   On: 05/05/2021 22:36    Pending Labs Unresulted Labs (From admission, onward)    Start     Ordered   05/06/21 0500  Magnesium  Tomorrow morning,   R       Question:  Specimen collection method  Answer:  Lab=Lab collect   05/05/21 1938   05/06/21 0500  Comprehensive metabolic panel  Tomorrow morning,   R       Question:  Specimen collection method  Answer:  Lab=Lab collect   05/05/21 1938   05/06/21 0500  CBC with Differential/Platelet  Tomorrow morning,   R       Question:  Specimen collection method  Answer:  Lab=Lab collect   05/05/21 1938   05/06/21 0500  Blood gas, venous  Tomorrow morning,   R        05/05/21 2024   05/06/21 0500  Lipid panel  Tomorrow morning,   R        05/05/21 2026   05/06/21 0500  Brain natriuretic peptide  Tomorrow morning,   R       Question:  Specimen collection method  Answer:  Lab=Lab collect   05/05/21 2035   05/06/21 0500  Phosphorus  Tomorrow morning,   R       Question:  Specimen collection method  Answer:  Lab=Lab collect   05/05/21 2035   05/05/21 2357  Calcium, ionized  Once,   R        05/05/21 2357   05/05/21 2026  Hemoglobin A1c  Add-on,   AD        05/05/21 2026   05/05/21 2024  Ammonia  Add-on,   AD        05/05/21 2024   05/05/21 2024  Methylmalonic acid, serum  Add-on,   AD        05/05/21 2024   05/05/21 1914  Urine culture  ONCE - STAT,   STAT        05/05/21 1914   05/05/21 1914  Culture, blood (routine x 2)  BLOOD CULTURE X 2,   STAT      05/05/21 1914   05/05/21 1430  CBC  (Stroke Panel (PNL))  ONCE - STAT,   STAT         05/05/21 1430   05/05/21 1430  Differential  (Stroke Panel (PNL))  ONCE - STAT,   STAT        05/05/21 1430          Vitals/Pain Today's Vitals   05/06/21 0000 05/06/21 0030 05/06/21 0100 05/06/21 0130  BP: 123/77 (!) 127/92 117/68 115/63  Pulse: 63  66 69  Resp: 16  16 18   Temp:      TempSrc:      SpO2: 100%  100% 98%  Weight:      Height:  PainSc:        Isolation Precautions No active isolations  Medications Medications  acetaminophen (TYLENOL) tablet 650 mg (has no administration in time range)    Or  acetaminophen (TYLENOL) suppository 650 mg (has no administration in time range)  lactated ringers infusion ( Intravenous New Bag/Given 05/05/21 2223)  cefTRIAXone (ROCEPHIN) 1 g in sodium chloride 0.9 % 100 mL IVPB (has no administration in time range)  aspirin EC tablet 81 mg (has no administration in time range)  carvedilol (COREG) tablet 6.25 mg (6.25 mg Oral Given 05/05/21 2224)  clopidogrel (PLAVIX) tablet 75 mg (has no administration in time range)  donepezil (ARICEPT) tablet 10 mg (has no administration in time range)  ferrous sulfate tablet 325 mg (has no administration in time range)  hydrALAZINE (APRESOLINE) tablet 50 mg (50 mg Oral Given 05/05/21 2224)  hydrALAZINE (APRESOLINE) tablet 100 mg (100 mg Oral Given 05/05/21 1543)  cefTRIAXone (ROCEPHIN) 1 g in sodium chloride 0.9 % 100 mL IVPB (0 g Intravenous Stopped 05/05/21 2222)    Mobility walks
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
## 2021-05-07 ENCOUNTER — Inpatient Hospital Stay (HOSPITAL_COMMUNITY): Payer: Medicare Other

## 2021-05-07 DIAGNOSIS — I10 Essential (primary) hypertension: Secondary | ICD-10-CM

## 2021-05-07 LAB — CBC WITH DIFFERENTIAL/PLATELET
Abs Immature Granulocytes: 0.02 10*3/uL (ref 0.00–0.07)
Basophils Absolute: 0 10*3/uL (ref 0.0–0.1)
Basophils Relative: 1 %
Eosinophils Absolute: 0.1 10*3/uL (ref 0.0–0.5)
Eosinophils Relative: 1 %
HCT: 28.9 % — ABNORMAL LOW (ref 39.0–52.0)
Hemoglobin: 9.5 g/dL — ABNORMAL LOW (ref 13.0–17.0)
Immature Granulocytes: 0 %
Lymphocytes Relative: 9 %
Lymphs Abs: 0.5 10*3/uL — ABNORMAL LOW (ref 0.7–4.0)
MCH: 31.8 pg (ref 26.0–34.0)
MCHC: 32.9 g/dL (ref 30.0–36.0)
MCV: 96.7 fL (ref 80.0–100.0)
Monocytes Absolute: 0.5 10*3/uL (ref 0.1–1.0)
Monocytes Relative: 9 %
Neutro Abs: 4.2 10*3/uL (ref 1.7–7.7)
Neutrophils Relative %: 80 %
Platelets: 145 10*3/uL — ABNORMAL LOW (ref 150–400)
RBC: 2.99 MIL/uL — ABNORMAL LOW (ref 4.22–5.81)
RDW: 12.9 % (ref 11.5–15.5)
WBC: 5.2 10*3/uL (ref 4.0–10.5)
nRBC: 0 % (ref 0.0–0.2)

## 2021-05-07 LAB — ECHOCARDIOGRAM COMPLETE
Area-P 1/2: 3.85 cm2
Height: 70 in
S' Lateral: 3 cm
Weight: 2687.85 oz

## 2021-05-07 LAB — MAGNESIUM: Magnesium: 1.6 mg/dL — ABNORMAL LOW (ref 1.7–2.4)

## 2021-05-07 LAB — RENAL FUNCTION PANEL
Albumin: 3.1 g/dL — ABNORMAL LOW (ref 3.5–5.0)
Anion gap: 6 (ref 5–15)
BUN: 36 mg/dL — ABNORMAL HIGH (ref 8–23)
CO2: 29 mmol/L (ref 22–32)
Calcium: 10.3 mg/dL (ref 8.9–10.3)
Chloride: 104 mmol/L (ref 98–111)
Creatinine, Ser: 2.23 mg/dL — ABNORMAL HIGH (ref 0.61–1.24)
GFR, Estimated: 31 mL/min — ABNORMAL LOW (ref >60)
Glucose, Bld: 125 mg/dL — ABNORMAL HIGH (ref 70–99)
Phosphorus: 2.6 mg/dL (ref 2.5–4.6)
Potassium: 3.2 mmol/L — ABNORMAL LOW (ref 3.5–5.1)
Sodium: 139 mmol/L (ref 135–145)

## 2021-05-07 LAB — CALCIUM, IONIZED: Calcium, Ionized, Serum: 5.6 mg/dL (ref 4.5–5.6)

## 2021-05-07 MED ORDER — POTASSIUM CHLORIDE CRYS ER 20 MEQ PO TBCR
40.0000 meq | EXTENDED_RELEASE_TABLET | ORAL | Status: AC
  Administered 2021-05-07 (×2): 40 meq via ORAL
  Filled 2021-05-07 (×2): qty 2

## 2021-05-07 MED ORDER — ENSURE ENLIVE PO LIQD
237.0000 mL | ORAL | Status: DC
  Administered 2021-05-07 – 2021-05-08 (×2): 237 mL via ORAL

## 2021-05-07 MED ORDER — MAGNESIUM SULFATE 2 GM/50ML IV SOLN
2.0000 g | Freq: Once | INTRAVENOUS | Status: AC
  Administered 2021-05-07: 2 g via INTRAVENOUS
  Filled 2021-05-07: qty 50

## 2021-05-07 NOTE — Plan of Care (Addendum)
Brief neurology note: NO CHARGE.   NP called to discuss patient's diagnosis from a neurological stand point and what work up has revealed.   NP informed wife MRI brain results. NP explained that there were old strokes but same as May's MRI. Explained new finding of one tiny infarct that is subacute. NP informed wife that the new stroke since May is likely not the cause of his episode of confusion, but rather, associated with his dementia.   NP discussed dementia with wife. She says Dr. Charlotte Crumb has given her literature. Also, informed wife about support groups. Asked her to f/up with Dr. Charlotte Crumb sooner thatn 6 month appointment. Patient has appointment with PCP soon, and NP encouraged her to keep that appointment.   Wife states that patient has agreed not to drive any more.   Informed wife that given stroke, our LDL goal is < 70, so he would go home on Lipitor. He should continue Plavix and ASA as before hospitalization.   All questions were asked and answered.   Echo/EEG still pending.   Clance Boll, MSN, APN-BC Neurology  Addendum: Patient to have Echo and have EEG as an outpatient if he is ready to be discharged by medicine.

## 2021-05-07 NOTE — Clinical Social Work Note (Signed)
CSW notified patient's wife was requesting to speak with SW. CSW spoke with wife and she is requesting to do the [facility] paperwork. CSW explained to wife she will need to file the [facility]  paperwork with the court.

## 2021-05-07 NOTE — Plan of Care (Addendum)
Pt very weak with difficulty standing up; needs two persons assist; large amount of urine in "Depends" and urinal. Was still able to rest through the night; no s/s of acute distress or pain reported or observed. Call light within reach and bed at lowest position with alarm on for safety. Condom cath applied, pt shows signs of incontinence.

## 2021-05-07 NOTE — Progress Notes (Signed)
  Echocardiogram 2D Echocardiogram has been performed.  Casey Greene 05/07/2021, 3:23 PM
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
## 2021-05-07 NOTE — Progress Notes (Addendum)
PROGRESS NOTE    Casey Greene  GYF:[id number] DOB: [birth year] DOA: 05/05/2021 PCP: [name], MD  Outpatient Specialists:    Brief Narrative:  Patient is a 70 year old African-American male with past medical history significant for stage IIIb chronic kidney disease with baseline creatinine 9.1-6.3, chronic diastolic heart failure, essential hypertension, hypercalcemia, chronic anemia with baseline hemoglobin 10-13 and urinary incontinence.  Of note, the patient has a documented history of acute ischemic CVA in January 2022, at which time MRI demonstrated pontine and bilateral thalamic ischemic infarcts.  Patient's wife reports that he was also recently diagnosed with a TIA prompting pursuit of MRI brain as well as MRA head and neck PE last week of May 2022 and the first week of June 2022, although she conveys presenting symptom at the time of TIA diagnosis was not confusion.  Patient is known to a local neurologist, [name].  He has also been concern for possible dementing process.     Patient was admitted with acute encephalopathy/confusion.  On presentation to the hospital, patient was volume depleted and was also thought to have possible UTI.  Urine culture is growing E. coli.  Patient has been volume resuscitated.  Patient is currently on IV Rocephin.  Patient had an MRI of the brain on days presentation that revealed small vessel infarct and subacute infarct.  Neurology team has been directing care.  Patient is currently on aspirin and Plavix.  Neurology team does not think that the current MRI brain finding is responsible for patient's presentation.  EEG is pending.  Echocardiogram is pending.     Assessment & Plan:   Principal Problem:   Acute encephalopathy Active Problems:   Essential hypertension   Hypokalemia   Chronic diastolic CHF (congestive heart failure) (HCC)   AKI (acute kidney injury) ([place])   Acute cystitis   Acute confusion: -Work-up is in progress. -MRI  brain revealed subacute infarct and small vessel infarct.  MRI brain finding is not thought to be responsible for current presentation.   -Work-up for possible UTI is in progress.  Urine culture grew E. coli. -Patient is currently on IV Rocephin.  Follow final urine culture result.   -Patient is on IV fluids for volume depletion.  Will discontinue IV fluid.   -Acute kidney injury has resolved with volume resuscitation.   -Baseline chronic kidney disease stage IIIb is noted.   -Patient is currently on aspirin and Plavix. -Acute confusion has resolved. -Patient was already being worked up for possible dementia on outpatient basis. -Neurology input is appreciated. -Awaiting echocardiogram, EEG and final urine culture.  Possible baseline dementia: -Patient is known to the local neurologist, [name]. -Work-up is currently in progress.  Volume depletion: -Diuretics have been on hold. -Patient has been hydrated. -Discontinue IV fluid.    Accelerated hypertension/hypertensive urgency: -Blood pressures currently at goal. -Continue to monitor closely. -Avoid hypotension.  Acute kidney injury on chronic kidney disease stage IIIb: -Acute kidney injury is likely secondary to volume depletion.   -AKI has resolved.   -Patient has baseline chronic kidney disease stage IIIb.  Hypokalemia: -Likely secondary to diuretics. -We will continue to monitor and replete. -Potassium is 3.2 today. -K-Dur 40 M EQ p.o. every 4 hours x2 doses. -IV magnesium 2 g x 1 dose.    Hypomagnesemia: -See above.  Hypercalcemia: -Resolved, likely secondary to volume depletion.  Chronic diastolic congestive heart failure: -Compensated. -No symptoms.  Urinary incontinence: -On oxybutynin on outpatient basis. -Hopefully, this not contributing to the confusion.  DVT prophylaxis: SCD Code Status: Full code Family Communication: Wife Disposition Plan: Will depend on hospital course   Consultants:   Neurology  Procedures:  None  Antimicrobials:  IV Rocephin   Subjective: Confusion has resolved. Patient moves all extremities.  Objective: Vitals:   05/06/21 1351 05/06/21 1702 05/06/21 2048 05/07/21 0512  BP: 103/67 104/62 122/75 (!) 157/90  Pulse: 60  65 (!) 59  Resp: 16  16 14   Temp: 98 F (36.7 C)  98.3 F (36.8 C) 97.8 F (36.6 C)  TempSrc: Oral  Oral Oral  SpO2: 100%  100% 100%  Weight:    76.2 kg  Height:        Intake/Output Summary (Last 24 hours) at 05/07/2021 1540 Last data filed at 05/07/2021 1400 Gross per 24 hour  Intake 3516.73 ml  Output 900 ml  Net 2616.73 ml    Filed Weights   05/05/21 1410 05/06/21 0354 05/07/21 0512  Weight: 77.1 kg 72.5 kg 76.2 kg    Examination:  General exam: Appears calm and comfortable.  Confusion seems to have resolved significantly. Respiratory system: Clear to auscultation. Respiratory effort normal. Cardiovascular system: S1 & S2 heard, Gastrointestinal system: Abdomen is nondistended, soft and nontender. No organomegaly or masses felt. Normal bowel sounds heard. Central nervous system: Awake and alert.  Patient moves all extremities.   Extremities: No leg edema.  Data Reviewed: I have personally reviewed following labs and imaging studies  CBC: Recent Labs  Lab 05/05/21 1441 05/06/21 0706 05/07/21 0708  WBC 6.2 5.4 5.2  NEUTROABS 5.0 4.0 4.2  HGB 10.3* 9.3* 9.5*  HCT 31.0* 28.4* 28.9*  MCV 96.3 97.9 96.7  PLT 174 144* 145*    Basic Metabolic Panel: Recent Labs  Lab 05/05/21 1441 05/05/21 2150 05/06/21 0706 05/07/21 0708  NA 140  --  141 139  K 3.2*  --  3.4* 3.2*  CL 103  --  102 104  CO2 27  --  29 29  GLUCOSE 104*  --  101* 125*  BUN 53*  --  47* 36*  CREATININE 3.35*  --  2.71* 2.23*  CALCIUM 10.6*  --  10.3 10.3  MG  --  2.1 1.7 1.6*  PHOS  --   --  3.0 2.6    GFR: Estimated Creatinine Clearance: 30.9 mL/min (A) (by C-G formula based on SCr of 2.23 mg/dL (H)). Liver Function  Tests: Recent Labs  Lab 05/05/21 1441 05/06/21 0706 05/07/21 0708  AST 22 18  --   ALT 11 10  --   ALKPHOS 41 38  --   BILITOT 0.7 0.7  --   PROT 7.4 6.8  --   ALBUMIN 4.0 3.7 3.1*    No results for input(s): LIPASE, AMYLASE in the last 168 hours. Recent Labs  Lab 05/06/21 0706  AMMONIA 11    Coagulation Profile: Recent Labs  Lab 05/05/21 1441  INR 1.0    Cardiac Enzymes: No results for input(s): CKTOTAL, CKMB, CKMBINDEX, TROPONINI in the last 168 hours. BNP (last 3 results) Recent Labs    02/03/21 1100  PROBNP 635*    HbA1C: No results for input(s): HGBA1C in the last 72 hours. CBG: No results for input(s): GLUCAP in the last 168 hours. Lipid Profile: Recent Labs    05/06/21 0706  CHOL 154  HDL 46  LDLCALC 101*  TRIG 33  CHOLHDL 3.3    Thyroid Function Tests: Recent Labs    05/05/21 2150  TSH 2.944  Anemia Panel: No results for input(s): VITAMINB12, FOLATE, FERRITIN, TIBC, IRON, RETICCTPCT in the last 72 hours. Urine analysis:    Component Value Date/Time   COLORURINE STRAW (A) 05/05/2021 1430   APPEARANCEUR CLEAR 05/05/2021 1430   LABSPEC 1.009 05/05/2021 1430   PHURINE 7.0 05/05/2021 1430   GLUCOSEU 50 (A) 05/05/2021 1430   GLUCOSEU NEGATIVE 03/28/2018 1206   HGBUR NEGATIVE 05/05/2021 1430   BILIRUBINUR NEGATIVE 05/05/2021 1430   KETONESUR NEGATIVE 05/05/2021 1430   PROTEINUR 30 (A) 05/05/2021 1430   UROBILINOGEN 0.2 03/28/2018 1206   NITRITE NEGATIVE 05/05/2021 1430   LEUKOCYTESUR MODERATE (A) 05/05/2021 1430   Sepsis Labs: @LABRCNTIP (procalcitonin:4,lacticidven:4)  ) Recent Results (from the past 240 hour(s))  Resp Panel by RT-PCR (Flu A&B, Covid) Nasopharyngeal Swab     Status: None   Collection Time: 05/05/21  2:30 PM   Specimen: Nasopharyngeal Swab; Nasopharyngeal(NP) swabs in vial transport medium  Result Value Ref Range Status   SARS Coronavirus 2 by RT PCR NEGATIVE NEGATIVE Final    Comment: (NOTE) SARS-CoV-2 target  nucleic acids are NOT DETECTED.  The SARS-CoV-2 RNA is generally detectable in upper respiratory specimens during the acute phase of infection. The lowest concentration of SARS-CoV-2 viral copies this assay can detect is 138 copies/mL. A negative result does not preclude SARS-Cov-2 infection and should not be used as the sole basis for treatment or other patient management decisions. A negative result may occur with  improper specimen collection/handling, submission of specimen other than nasopharyngeal swab, presence of viral mutation(s) within the areas targeted by this assay, and inadequate number of viral copies(<138 copies/mL). A negative result must be combined with clinical observations, patient history, and epidemiological information. The expected result is Negative.  Fact Sheet for Patients:  EntrepreneurPulse.com.au  Fact Sheet for Healthcare Providers:  IncredibleEmployment.be  This test is no t yet approved or cleared by the [place] FDA and  has been authorized for detection and/or diagnosis of SARS-CoV-2 by FDA under an Emergency Use Authorization (EUA). This EUA will remain  in effect (meaning this test can be used) for the duration of the COVID-19 declaration under Section 564(b)(1) of the Act, 21 U.S.C.section 360bbb-3(b)(1), unless the authorization is terminated  or revoked sooner.       Influenza A by PCR NEGATIVE NEGATIVE Final   Influenza B by PCR NEGATIVE NEGATIVE Final    Comment: (NOTE) The Xpert Xpress SARS-CoV-2/FLU/RSV plus assay is intended as an aid in the diagnosis of influenza from Nasopharyngeal swab specimens and should not be used as a sole basis for treatment. Nasal washings and aspirates are unacceptable for Xpert Xpress SARS-CoV-2/FLU/RSV testing.  Fact Sheet for Patients: EntrepreneurPulse.com.au  Fact Sheet for Healthcare  Providers: IncredibleEmployment.be  This test is not yet approved or cleared by the [place] FDA and has been authorized for detection and/or diagnosis of SARS-CoV-2 by FDA under an Emergency Use Authorization (EUA). This EUA will remain in effect (meaning this test can be used) for the duration of the COVID-19 declaration under Section 564(b)(1) of the Act, 21 U.S.C. section 360bbb-3(b)(1), unless the authorization is terminated or revoked.  Performed at [facility], [place] [address], [place], [place] [address]   Urine culture     Status: Abnormal (Preliminary result)   Collection Time: 05/05/21  2:30 PM   Specimen: Urine, Random  Result Value Ref Range Status   Specimen Description   Final    URINE, RANDOM Performed at [facility] [address]  [place], [place] [address]    Special Requests   Final    NONE Performed at [facility], [place] [address], [place], [place] [address]    Culture (A)  Final    >=100,000 COLONIES/mL ESCHERICHIA COLI SUSCEPTIBILITIES TO FOLLOW Performed at [facility], [place] [address], [place], [place] [address]    Report Status PENDING  Incomplete  Culture, blood (routine x 2)     Status: None (Preliminary result)   Collection Time: 05/05/21  9:50 PM   Specimen: BLOOD RIGHT FOREARM  Result Value Ref Range Status   Specimen Description   Final    BLOOD RIGHT FOREARM Performed at [facility], [place] [address] [place], [place] [address]    Special Requests   Final    BOTTLES DRAWN AEROBIC AND ANAEROBIC Blood Culture adequate volume Performed at [place] [facility] [address], [place], [place] [address]    Culture   Final    NO GROWTH 1 DAY Performed at [facility], [place] [address] [place], [place] [address]    Report Status PENDING  Incomplete  Culture, blood (routine x 2)     Status: None (Preliminary result)   Collection Time:  05/06/21  7:06 AM   Specimen: BLOOD  Result Value Ref Range Status   Specimen Description   Final    BLOOD SITE NOT SPECIFIED Performed at [place] [address], [place], [place] [address]    Special Requests   Final    BOTTLES DRAWN AEROBIC ONLY Blood Culture adequate volume Performed at [facility] [address], [place], [place] [address]    Culture   Final    NO GROWTH < 24 HOURS Performed at [facility] [address], [place], [place] [address]    Report Status PENDING  Incomplete         Radiology Studies: MR ANGIO HEAD WO CONTRAST  Result Date: 05/05/2021 CLINICAL DATA:  Stroke, follow-up EXAM: MRA HEAD WITHOUT CONTRAST TECHNIQUE: Angiographic images of the Circle of Willis were acquired using MRA technique without intravenous contrast. COMPARISON:  04/30/2021 FINDINGS: Anterior circulation: Intracranial internal carotid arteries are patent. Anterior and middle cerebral arteries are patent. Posterior circulation: Partially imaged intracranial vertebral arteries are patent. Basilar artery is patent. Posterior cerebral arteries are patent. Bilateral posterior communicating arteries are present. Other: None. IMPRESSION: No proximal intracranial vessel occlusion. No change from recent prior study. Electronically Signed   By: Macy Mis M.D.   On: 05/05/2021 19:07   MR BRAIN WO CONTRAST  Result Date: 05/05/2021 CLINICAL DATA:  Initial evaluation for acute TIA, abrupt onset confusion. EXAM: MRI HEAD WITHOUT CONTRAST TECHNIQUE: Multiplanar, multiecho pulse sequences of the brain and surrounding structures were obtained without intravenous contrast. COMPARISON:  Prior CT from earlier the same day as well as recent MRI from 04/15/2020. FINDINGS: Brain: Generalized age-related cerebral atrophy. Patchy and confluent T2/FLAIR hyperintensity seen within the periventricular and deep white matter both cerebral hemispheres as well as  the pons, most consistent with chronic small vessel ischemic disease. Multiple scattered superimposed remote lacunar infarcts present about the hemispheric cerebral white matter, deep gray nuclei, and pons. Mild residual diffusion abnormality seen about the recently identified too small subacute infarcts that were previously seen on recent MRI from 04/15/2021 (series 5, images 84, 68). Now seen are several additional new subcentimeter foci of diffusion abnormality involving the splenium (series 5, image 75), left posterior periatrial and subcortical  white matter (series 5, images 75, 69). These foci measure up to approximately 4 mm. No associated hemorrhage or mass effect. Multiple additional apparent foci of subtle diffusion abnormality about the basal ganglia and thalami favored to likely be related to adjacent susceptibility artifact, similar to previous. Otherwise, gray-white matter differentiation maintained. No other areas of remote cortical infarction. No evidence for acute intracranial hemorrhage. Again seen are innumerable scattered chronic micro hemorrhages clustered about the cerebellum and deep gray nuclei, with most pronounced involvement about the thalami. Findings consistent with chronic microhemorrhages, and most likely related to chronic poorly controlled hypertension given distribution. No mass lesion, midline shift or mass effect. Ventricles normal size without hydrocephalus. No extra-axial fluid collection. Pituitary gland suprasellar region normal. Midline structures intact. Vascular: Major intracranial vascular flow voids are maintained. Skull and upper cervical spine: Craniocervical junction within normal limits. Bone marrow signal intensity normal. 2.2 cm epidermal inclusion cyst noted at the left suboccipital region. Scalp soft tissues otherwise unremarkable. Sinuses/Orbits: Globes and orbital soft tissues demonstrate no acute finding. Scattered mucosal thickening noted within the ethmoidal  air cells and maxillary sinuses. Paranasal sinuses are otherwise clear. No mastoid effusion. Inner ear structures grossly normal. Other: None. IMPRESSION: 1. Few new subcentimeter foci of diffusion abnormality involving the splenium as well as the left posterior periatrial and subcortical white matter as above, suspicious for acute to subacute small vessel type infarcts. No associated hemorrhage or mass effect. 2. Mild residual diffusion abnormality about the recently identified two small subacute infarcts that were previously seen on recent MRI from 04/15/2021. 3. Underlying age-related cerebral atrophy with chronic microvascular ischemic disease with multiple scattered remote lacunar infarcts involving the hemispheric cerebral white matter, deep gray nuclei, and pons. 4. Innumerable chronic micro hemorrhages clustered about the cerebellum and deep gray nuclei, most consistent with chronic poorly controlled hypertension. Electronically Signed   By: Jeannine Boga M.D.   On: 05/05/2021 21:46   DG CHEST PORT 1 VIEW  Result Date: 05/05/2021 CLINICAL DATA:  Acute encephalopathy EXAM: PORTABLE CHEST 1 VIEW COMPARISON:  10/07/2020 FINDINGS: Heart is borderline in size. Lungs clear. No effusions or edema. No acute bony abnormality. IMPRESSION: No active disease. Electronically Signed   By: Rolm Baptise M.D.   On: 05/05/2021 22:36   ECHOCARDIOGRAM COMPLETE  Result Date: 05/07/2021    ECHOCARDIOGRAM REPORT   Patient Name:   OLE LAFON Date of Exam: 05/07/2021 Medical Rec #:  [id number]     Height:       70.0 in Accession #:    [id number]    Weight:       168.0 lb Date of Birth:  Jul 07, 1951     BSA:          1.938 m Patient Age:    53 years      BP:           157/90 mmHg Patient Gender: M             HR:           59 bpm. Exam Location:  Inpatient Procedure: 2D Echo, Cardiac Doppler and Color Doppler Indications:    Stroke I63.9  History:        Patient has prior history of Echocardiogram examinations, most                  recent 07/29/2019. CHF, Stroke; Risk Factors:Hypertension and                 Dyslipidemia. CKD.  Sonographer:    Jonelle Sidle Dance Referring Phys: [place]  1. Left ventricular ejection fraction, by estimation, is 65 to 70%. The left ventricle has normal function. The left ventricle has no regional wall motion abnormalities. There is mild concentric left ventricular hypertrophy. Left ventricular diastolic parameters are consistent with Grade II diastolic dysfunction (pseudonormalization). Elevated left ventricular end-diastolic pressure.  2. Right ventricular systolic function is normal. The right ventricular size is normal. Tricuspid regurgitation signal is inadequate for assessing PA pressure.  3. Left atrial size was severely dilated.  4. There is a small bright target on the anterior MV leaflet tip that likely represents calcific MV disease. . The mitral valve is degenerative. Mild mitral valve regurgitation. No evidence of mitral stenosis.  5. The aortic valve is tricuspid. Aortic valve regurgitation is not visualized. Mild aortic valve sclerosis is present, with no evidence of aortic valve stenosis.  6. The inferior vena cava is dilated in size with >50% respiratory variability, suggesting right atrial pressure of 8 mmHg. FINDINGS  Left Ventricle: Left ventricular ejection fraction, by estimation, is 65 to 70%. The left ventricle has normal function. The left ventricle has no regional wall motion abnormalities. The left ventricular internal cavity size was normal in size. There is  mild concentric left ventricular hypertrophy. Left ventricular diastolic parameters are consistent with Grade II diastolic dysfunction (pseudonormalization). Elevated left ventricular end-diastolic pressure. Right Ventricle: The right ventricular size is normal. No increase in right ventricular wall thickness. Right ventricular systolic function is normal. Tricuspid regurgitation signal is  inadequate for assessing PA pressure. Left Atrium: Left atrial size was severely dilated. Right Atrium: Right atrial size was normal in size. Pericardium: There is no evidence of pericardial effusion. Mitral Valve: There is a small bright target on the anterior MV leaflet tip that likely represents calcific MV disease. The mitral valve is degenerative in appearance. There is mild calcification of the anterior mitral valve leaflet(s). Mild mitral valve  regurgitation. No evidence of mitral valve stenosis. Tricuspid Valve: The tricuspid valve is normal in structure. Tricuspid valve regurgitation is trivial. No evidence of tricuspid stenosis. Aortic Valve: The aortic valve is tricuspid. Aortic valve regurgitation is not visualized. Mild aortic valve sclerosis is present, with no evidence of aortic valve stenosis. Pulmonic Valve: The pulmonic valve was normal in structure. Pulmonic valve regurgitation is not visualized. No evidence of pulmonic stenosis. Aorta: The aortic root is normal in size and structure. Venous: The inferior vena cava is dilated in size with greater than 50% respiratory variability, suggesting right atrial pressure of 8 mmHg. IAS/Shunts: No atrial level shunt detected by color flow Doppler.  LEFT VENTRICLE PLAX 2D LVIDd:         4.60 cm  Diastology LVIDs:         3.00 cm  LV e' medial:    6.20 cm/s LV PW:         1.50 cm  LV E/e' medial:  15.1 LV IVS:        1.30 cm  LV e' lateral:   6.42 cm/s LVOT diam:     2.00 cm  LV E/e' lateral: 14.6 LV SV:         69 LV SV Index:   35 LVOT Area:     3.14 cm  RIGHT VENTRICLE             IVC RV Basal diam:  2.90 cm     IVC diam: 2.10 cm RV S prime:  13.70 cm/s TAPSE (M-mode): 2.4 cm LEFT ATRIUM              Index       RIGHT ATRIUM           Index LA diam:        3.30 cm  1.70 cm/m  RA Area:     21.20 cm LA Vol (A2C):   104.0 ml 53.66 ml/m RA Volume:   57.30 ml  29.56 ml/m LA Vol (A4C):   69.0 ml  35.60 ml/m LA Biplane Vol: 94.5 ml  48.76 ml/m  AORTIC  VALVE LVOT Vmax:   87.80 cm/s LVOT Vmean:  59.100 cm/s LVOT VTI:    0.218 m  AORTA Ao Root diam: 3.60 cm Ao Asc diam:  3.40 cm MITRAL VALVE MV Area (PHT): 3.85 cm    SHUNTS MV Decel Time: 197 msec    Systemic VTI:  0.22 m MV E velocity: 93.60 cm/s  Systemic Diam: 2.00 cm MV A velocity: 70.40 cm/s MV E/A ratio:  1.33 Fransico Him MD Electronically signed by Fransico Him MD Signature Date/Time: 05/07/2021/3:30:59 PM    Final         Scheduled Meds:  aspirin EC  81 mg Oral Daily   atorvastatin  40 mg Oral QHS   carvedilol  6.25 mg Oral BID   clopidogrel  75 mg Oral Daily   donepezil  10 mg Oral Daily   feeding supplement  237 mL Oral Q24H   ferrous sulfate  325 mg Oral Q breakfast   hydrALAZINE  50 mg Oral TID   potassium chloride  40 mEq Oral Q4H   Continuous Infusions:  cefTRIAXone (ROCEPHIN)  IV 1 g (05/06/21 2030)   magnesium sulfate bolus IVPB       LOS: 2 days    Time spent: 25 minutes.    Dana Allan, MD  [facility] Pager #: [phone] 7PM-7AM contact night coverage as above

## 2021-05-08 ENCOUNTER — Inpatient Hospital Stay (HOSPITAL_COMMUNITY)
Admit: 2021-05-08 | Discharge: 2021-05-08 | Disposition: A | Discharge status: Home / Self Care | Payer: Medicare Other | Attending: Internal Medicine | Admitting: Internal Medicine

## 2021-05-08 ENCOUNTER — Telehealth: Payer: Self-pay | Admitting: Neurology

## 2021-05-08 ENCOUNTER — Other Ambulatory Visit: Payer: Self-pay

## 2021-05-08 DIAGNOSIS — G9340 Encephalopathy, unspecified: Secondary | ICD-10-CM

## 2021-05-08 LAB — RENAL FUNCTION PANEL
Albumin: 3.2 g/dL — ABNORMAL LOW (ref 3.5–5.0)
Anion gap: 7 (ref 5–15)
BUN: 34 mg/dL — ABNORMAL HIGH (ref 8–23)
CO2: 30 mmol/L (ref 22–32)
Calcium: 10.1 mg/dL (ref 8.9–10.3)
Chloride: 105 mmol/L (ref 98–111)
Creatinine, Ser: 2.09 mg/dL — ABNORMAL HIGH (ref 0.61–1.24)
GFR, Estimated: 33 mL/min — ABNORMAL LOW
Glucose, Bld: 101 mg/dL — ABNORMAL HIGH (ref 70–99)
Phosphorus: 2.2 mg/dL — ABNORMAL LOW (ref 2.5–4.6)
Potassium: 3.4 mmol/L — ABNORMAL LOW (ref 3.5–5.1)
Sodium: 142 mmol/L (ref 135–145)

## 2021-05-08 LAB — URINE CULTURE: Culture: >=100000 — AB

## 2021-05-08 LAB — HEMOGLOBIN A1C
Hgb A1c MFr Bld: 5.2 % (ref 4.8–5.6)
Mean Plasma Glucose: 103 mg/dL

## 2021-05-08 LAB — MAGNESIUM: Magnesium: 2 mg/dL (ref 1.7–2.4)

## 2021-05-08 MED ORDER — POTASSIUM CHLORIDE CRYS ER 20 MEQ PO TBCR
40.0000 meq | EXTENDED_RELEASE_TABLET | ORAL | Status: AC
  Administered 2021-05-08 (×2): 40 meq via ORAL
  Filled 2021-05-08 (×2): qty 2

## 2021-05-08 MED ORDER — CEPHALEXIN 500 MG PO CAPS
500.0000 mg | ORAL_CAPSULE | Freq: Two times a day (BID) | ORAL | Status: DC
  Administered 2021-05-08 – 2021-05-09 (×3): 500 mg via ORAL
  Filled 2021-05-08 (×3): qty 1

## 2021-05-08 NOTE — Procedures (Addendum)
Patient Name: Casey Greene  MRN: [id number]  Epilepsy Attending: Lora Havens  Referring Physician/Provider: Clance Boll, NP Date: 05/08/2021 Duration: 23.07 mins  Patient history: 70 yo male who presented to [facility] yesterday after a 45 min to 1 hr episode of confusion while driving. EEG to evaluate for seizure  Level of alertness: Awake  AEDs during EEG study: None  Technical aspects: This EEG study was done with scalp electrodes positioned according to the 10-20 International system of electrode placement. Electrical activity was acquired at a sampling rate of 500Hz  and reviewed with a high frequency filter of 70Hz  and a low frequency filter of 1Hz . EEG data were recorded continuously and digitally stored.   Description: The posterior dominant rhythm consists of 9 Hz activity of moderate voltage (25-35 uV) seen predominantly in posterior head regions, symmetric and reactive to eye opening and eye closing. EEG showed intermittent left>right temporal independent 3 to 6 Hz theta-delta slowing. Hyperventilation and photic stimulation were not performed.     ABNORMALITY - Intermittent slow, left>right temporal region  IMPRESSION: This study is suggestive of non specific cortical dysfunction arising from left> right temporal region.  No seizures or epileptiform discharges were seen throughout the recording.  Tobenna Needs Barbra Sarks

## 2021-05-08 NOTE — Progress Notes (Signed)
EEG Completed; Results Pending  

## 2021-05-08 NOTE — Progress Notes (Signed)
PROGRESS NOTE  Yash Cacciola TDV:[id number] DOB: 17-Nov-1951 DOA: 05/05/2021 PCP: [name], MD   LOS: 3 days   Brief narrative:  Patient is a 70 year old African-American male with past medical history significant for stage IIIb chronic kidney disease with baseline creatinine 0.6-2.6, chronic diastolic heart failure, essential hypertension, hypercalcemia, chronic anemia with baseline hemoglobin 10-13 and urinary incontinence presented to the hospital with acute encephalopathy and confusion.  He was noted to be volume depleted and urine culture showed E. coli.  He was volume resuscitated and his mentation started improving.  He did have a recent MRI of the brain which revealed schertz subacute infarct.  Neurology was consulted.  At this time patient had undergone EKG, echocardiogram and EEG is pending at this time.  Assessment/Plan:  Principal Problem:   Acute encephalopathy Active Problems:   Essential hypertension   Hypokalemia   Chronic diastolic CHF (congestive heart failure) (HCC)   AKI (acute kidney injury) ([place])   Acute cystitis   Acute confusion possible metabolic encephalopathy.: -MRI brain revealed subacute infarct and small vessel infarct.  However MRI findings were not thought to be the reason for this presentation.    Urine culture grew E. coli.  Continue IV Rocephin. Neurology on board.  2D echocardiogram within normal limits.  EEG pending at this time.  Possible baseline dementia: -Patient is known to the local neurologist, [name].  Work-up in progress.  Volume depletion: -Improved after volume replacement.  Diuretics on hold   Accelerated hypertension/hypertensive urgency: Latest blood pressure of 161/83.  Continue to monitor closely.  Avoid hypotension.   Acute kidney injury on chronic kidney disease stage IIIb: Improved after volume repletion.  Currently at baseline.   Hypokalemia: Likely secondary to diuretic.  Potassium of 3.4 today.  We will  continue to replenish orally.  Check levels in a.m.   Hypomagnesemia: Improved after replacement.  Latest magnesium of 2.0.   Hypercalcemia: Improved after volume replacement.   Chronic diastolic congestive heart failure: Currently compensated.   Urinary incontinence: On oxybutynin as outpatient.  Weakness debility.  Patient has been seen by physical therapy recommended home PT on discharge.    DVT prophylaxis: SCDs Start: 05/05/21 1937   Code Status: Full code  Family Communication: None today  Status is: Inpatient  Remains inpatient appropriate because:Ongoing diagnostic testing needed not appropriate for outpatient work up, IV treatments appropriate due to intensity of illness or inability to take PO, and Inpatient level of care appropriate due to severity of illness  Dispo: The patient is from: Home              Anticipated d/c is to:  Home with home PT , awaiting for EEG.  Likely home tomorrow              Patient currently is not medically stable to d/c.   Difficult to place patient No   Consultants: Neurology  Procedures: None  Anti-infectives:  Keflex  Anti-infectives (From admission, onward)    Start     Dose/Rate Route Frequency Ordered Stop   05/08/21 1200  cephALEXin (KEFLEX) capsule 500 mg        500 mg Oral Every 12 hours 05/08/21 0938     05/06/21 2000  cefTRIAXone (ROCEPHIN) 1 g in sodium chloride 0.9 % 100 mL IVPB  Status:  Discontinued        1 g 200 mL/hr over 30 Minutes Intravenous Every 24 hours 05/05/21 2015 05/08/21 0938   05/05/21 1915  cefTRIAXone (ROCEPHIN) 1  g in sodium chloride 0.9 % 100 mL IVPB        1 g 200 mL/hr over 30 Minutes Intravenous  Once 05/05/21 1914 05/05/21 2222        Subjective: Today, patient was seen and examined at bedside.  Patient states that he had some trouble eating with his hands.  Overall feels okay.  Denies any urinary urgency frequency or dysuria.  Denies dizziness  lightheadedness.  Objective: Vitals:   05/08/21 0508 05/08/21 1409  BP: (!) 145/79 (!) 161/83  Pulse: 63 66  Resp: 14 16  Temp: 98.8 F (37.1 C) 98.7 F (37.1 C)  SpO2: 99% 96%    Intake/Output Summary (Last 24 hours) at 05/08/2021 1617 Last data filed at 05/08/2021 1330 Gross per 24 hour  Intake 700 ml  Output 2050 ml  Net -1350 ml   Filed Weights   05/06/21 0354 05/07/21 0512 05/08/21 0520  Weight: 72.5 kg 76.2 kg 76.1 kg   Body mass index is 24.07 kg/m.   Physical Exam: GENERAL: Patient is alert awake and oriented. Not in obvious distress. HENT: No scleral pallor or icterus. Pupils equally reactive to light. Oral mucosa is moist NECK: is supple, no gross swelling noted. CHEST: Clear to auscultation. No crackles or wheezes.  Diminished breath sounds bilaterally. CVS: S1 and S2 heard, no murmur. Regular rate and rhythm.  ABDOMEN: Soft, non-tender, bowel sounds are present. EXTREMITIES: No edema. CNS: Cranial nerves are intact. No focal motor deficits. SKIN: warm and dry without rashes.  Data Review: I have personally reviewed the following laboratory data and studies,  CBC: Recent Labs  Lab 05/05/21 1441 05/06/21 0706 05/07/21 0708  WBC 6.2 5.4 5.2  NEUTROABS 5.0 4.0 4.2  HGB 10.3* 9.3* 9.5*  HCT 31.0* 28.4* 28.9*  MCV 96.3 97.9 96.7  PLT 174 144* 161*   Basic Metabolic Panel: Recent Labs  Lab 05/05/21 1441 05/05/21 2150 05/06/21 0706 05/07/21 0708 05/08/21 0548  NA 140  --  141 139 142  K 3.2*  --  3.4* 3.2* 3.4*  CL 103  --  102 104 105  CO2 27  --  29 29 30   GLUCOSE 104*  --  101* 125* 101*  BUN 53*  --  47* 36* 34*  CREATININE 3.35*  --  2.71* 2.23* 2.09*  CALCIUM 10.6*  --  10.3 10.3 10.1  MG  --  2.1 1.7 1.6* 2.0  PHOS  --   --  3.0 2.6 2.2*   Liver Function Tests: Recent Labs  Lab 05/05/21 1441 05/06/21 0706 05/07/21 0708 05/08/21 0548  AST 22 18  --   --   ALT 11 10  --   --   ALKPHOS 41 38  --   --   BILITOT 0.7 0.7  --   --    PROT 7.4 6.8  --   --   ALBUMIN 4.0 3.7 3.1* 3.2*   No results for input(s): LIPASE, AMYLASE in the last 168 hours. Recent Labs  Lab 05/06/21 0706  AMMONIA 11   Cardiac Enzymes: No results for input(s): CKTOTAL, CKMB, CKMBINDEX, TROPONINI in the last 168 hours. BNP (last 3 results) Recent Labs    05/06/21 0706  BNP 78.4    ProBNP (last 3 results) Recent Labs    02/03/21 1100  PROBNP 635*    CBG: No results for input(s): GLUCAP in the last 168 hours. Recent Results (from the past 240 hour(s))  Resp Panel by RT-PCR (Flu A&B, Covid) Nasopharyngeal  Swab     Status: None   Collection Time: 05/05/21  2:30 PM   Specimen: Nasopharyngeal Swab; Nasopharyngeal(NP) swabs in vial transport medium  Result Value Ref Range Status   SARS Coronavirus 2 by RT PCR NEGATIVE NEGATIVE Final    Comment: (NOTE) SARS-CoV-2 target nucleic acids are NOT DETECTED.  The SARS-CoV-2 RNA is generally detectable in upper respiratory specimens during the acute phase of infection. The lowest concentration of SARS-CoV-2 viral copies this assay can detect is 138 copies/mL. A negative result does not preclude SARS-Cov-2 infection and should not be used as the sole basis for treatment or other patient management decisions. A negative result may occur with  improper specimen collection/handling, submission of specimen other than nasopharyngeal swab, presence of viral mutation(s) within the areas targeted by this assay, and inadequate number of viral copies(<138 copies/mL). A negative result must be combined with clinical observations, patient history, and epidemiological information. The expected result is Negative.  Fact Sheet for Patients:  EntrepreneurPulse.com.au  Fact Sheet for Healthcare Providers:  IncredibleEmployment.be  This test is no t yet approved or cleared by the [place] FDA and  has been authorized for detection and/or diagnosis of SARS-CoV-2  by FDA under an Emergency Use Authorization (EUA). This EUA will remain  in effect (meaning this test can be used) for the duration of the COVID-19 declaration under Section 564(b)(1) of the Act, 21 U.S.C.section 360bbb-3(b)(1), unless the authorization is terminated  or revoked sooner.       Influenza A by PCR NEGATIVE NEGATIVE Final   Influenza B by PCR NEGATIVE NEGATIVE Final    Comment: (NOTE) The Xpert Xpress SARS-CoV-2/FLU/RSV plus assay is intended as an aid in the diagnosis of influenza from Nasopharyngeal swab specimens and should not be used as a sole basis for treatment. Nasal washings and aspirates are unacceptable for Xpert Xpress SARS-CoV-2/FLU/RSV testing.  Fact Sheet for Patients: EntrepreneurPulse.com.au  Fact Sheet for Healthcare Providers: IncredibleEmployment.be  This test is not yet approved or cleared by the [place] FDA and has been authorized for detection and/or diagnosis of SARS-CoV-2 by FDA under an Emergency Use Authorization (EUA). This EUA will remain in effect (meaning this test can be used) for the duration of the COVID-19 declaration under Section 564(b)(1) of the Act, 21 U.S.C. section 360bbb-3(b)(1), unless the authorization is terminated or revoked.  Performed at [facility], [place] [address], [place], [place] [address]   Urine culture     Status: Abnormal   Collection Time: 05/05/21  2:30 PM   Specimen: Urine, Random  Result Value Ref Range Status   Specimen Description   Final    URINE, RANDOM Performed at [facility] [address], [place], [place] [address]    Special Requests   Final    NONE Performed at [facility], [place] [address], [place], [place] [address]    Culture >=100,000 COLONIES/mL ESCHERICHIA COLI (A)  Final   Report Status 05/08/2021 FINAL  Final   Organism ID, Bacteria ESCHERICHIA COLI (A)  Final      Susceptibility    Escherichia coli - MIC*    AMPICILLIN <=2 SENSITIVE Sensitive     CEFAZOLIN <=4 SENSITIVE Sensitive     CEFEPIME <=0.12 SENSITIVE Sensitive     CEFTRIAXONE <=0.25 SENSITIVE Sensitive     CIPROFLOXACIN <=0.25 SENSITIVE Sensitive     GENTAMICIN <=1 SENSITIVE Sensitive     IMIPENEM <=0.25 SENSITIVE Sensitive     NITROFURANTOIN <=16 SENSITIVE Sensitive  TRIMETH/SULFA <=20 SENSITIVE Sensitive     AMPICILLIN/SULBACTAM <=2 SENSITIVE Sensitive     PIP/TAZO <=4 SENSITIVE Sensitive     * >=100,000 COLONIES/mL ESCHERICHIA COLI  Culture, blood (routine x 2)     Status: None (Preliminary result)   Collection Time: 05/05/21  9:50 PM   Specimen: BLOOD RIGHT FOREARM  Result Value Ref Range Status   Specimen Description   Final    BLOOD RIGHT FOREARM Performed at [facility], [place] [address], [place], [place] [address]    Special Requests   Final    BOTTLES DRAWN AEROBIC AND ANAEROBIC Blood Culture adequate volume Performed at [facility] [address] [place], [place] [address]    Culture   Final    NO GROWTH 2 DAYS Performed at [facility] [address], [place], [place] [address]    Report Status PENDING  Incomplete  Culture, blood (routine x 2)     Status: None (Preliminary result)   Collection Time: 05/06/21  7:06 AM   Specimen: BLOOD  Result Value Ref Range Status   Specimen Description   Final    BLOOD SITE NOT SPECIFIED Performed at [facility] [address], [place], [place] [address]    Special Requests   Final    BOTTLES DRAWN AEROBIC ONLY Blood Culture adequate volume Performed at [facility] [address] [place], [place] [address]    Culture   Final    NO GROWTH 2 DAYS Performed at [facility] [address], [place],  [address]    Report Status PENDING  Incomplete     Studies: ECHOCARDIOGRAM COMPLETE  Result Date: 05/07/2021    ECHOCARDIOGRAM REPORT   Patient Name:    VENKAT ANKNEY Date of Exam: 05/07/2021 Medical Rec #:  [id number]     Height:       70.0 in Accession #:    [id number]    Weight:       168.0 lb Date of Birth:  Aug 27, 1951     BSA:          1.938 m Patient Age:    74 years      BP:           157/90 mmHg Patient Gender: M             HR:           59 bpm. Exam Location:  Inpatient Procedure: 2D Echo, Cardiac Doppler and Color Doppler Indications:    Stroke I63.9  History:        Patient has prior history of Echocardiogram examinations, most                 recent 07/29/2019. CHF, Stroke; Risk Factors:Hypertension and                 Dyslipidemia. CKD.  Sonographer:    Jonelle Sidle Dance Referring Phys: [place]  1. Left ventricular ejection fraction, by estimation, is 65 to 70%. The left ventricle has normal function. The left ventricle has no regional wall motion abnormalities. There is mild concentric left ventricular hypertrophy. Left ventricular diastolic parameters are consistent with Grade II diastolic dysfunction (pseudonormalization). Elevated left ventricular end-diastolic pressure.  2. Right ventricular systolic function is normal. The right ventricular size is normal. Tricuspid regurgitation signal is inadequate for assessing PA pressure.  3. Left atrial size was severely dilated.  4. There is a small  bright target on the anterior MV leaflet tip that likely represents calcific MV disease. . The mitral valve is degenerative. Mild mitral valve regurgitation. No evidence of mitral stenosis.  5. The aortic valve is tricuspid. Aortic valve regurgitation is not visualized. Mild aortic valve sclerosis is present, with no evidence of aortic valve stenosis.  6. The inferior vena cava is dilated in size with >50% respiratory variability, suggesting right atrial pressure of 8 mmHg. FINDINGS  Left Ventricle: Left ventricular ejection fraction, by estimation, is 65 to 70%. The left ventricle has normal function. The left ventricle has no regional  wall motion abnormalities. The left ventricular internal cavity size was normal in size. There is  mild concentric left ventricular hypertrophy. Left ventricular diastolic parameters are consistent with Grade II diastolic dysfunction (pseudonormalization). Elevated left ventricular end-diastolic pressure. Right Ventricle: The right ventricular size is normal. No increase in right ventricular wall thickness. Right ventricular systolic function is normal. Tricuspid regurgitation signal is inadequate for assessing PA pressure. Left Atrium: Left atrial size was severely dilated. Right Atrium: Right atrial size was normal in size. Pericardium: There is no evidence of pericardial effusion. Mitral Valve: There is a small bright target on the anterior MV leaflet tip that likely represents calcific MV disease. The mitral valve is degenerative in appearance. There is mild calcification of the anterior mitral valve leaflet(s). Mild mitral valve  regurgitation. No evidence of mitral valve stenosis. Tricuspid Valve: The tricuspid valve is normal in structure. Tricuspid valve regurgitation is trivial. No evidence of tricuspid stenosis. Aortic Valve: The aortic valve is tricuspid. Aortic valve regurgitation is not visualized. Mild aortic valve sclerosis is present, with no evidence of aortic valve stenosis. Pulmonic Valve: The pulmonic valve was normal in structure. Pulmonic valve regurgitation is not visualized. No evidence of pulmonic stenosis. Aorta: The aortic root is normal in size and structure. Venous: The inferior vena cava is dilated in size with greater than 50% respiratory variability, suggesting right atrial pressure of 8 mmHg. IAS/Shunts: No atrial level shunt detected by color flow Doppler.  LEFT VENTRICLE PLAX 2D LVIDd:         4.60 cm  Diastology LVIDs:         3.00 cm  LV e' medial:    6.20 cm/s LV PW:         1.50 cm  LV E/e' medial:  15.1 LV IVS:        1.30 cm  LV e' lateral:   6.42 cm/s LVOT diam:     2.00 cm   LV E/e' lateral: 14.6 LV SV:         69 LV SV Index:   35 LVOT Area:     3.14 cm  RIGHT VENTRICLE             IVC RV Basal diam:  2.90 cm     IVC diam: 2.10 cm RV S prime:     13.70 cm/s TAPSE (M-mode): 2.4 cm LEFT ATRIUM              Index       RIGHT ATRIUM           Index LA diam:        3.30 cm  1.70 cm/m  RA Area:     21.20 cm LA Vol (A2C):   104.0 ml 53.66 ml/m RA Volume:   57.30 ml  29.56 ml/m LA Vol (A4C):   69.0 ml  35.60 ml/m LA Biplane Vol: 94.5 ml  48.76 ml/m  AORTIC VALVE LVOT Vmax:   87.80 cm/s LVOT Vmean:  59.100 cm/s LVOT VTI:    0.218 m  AORTA Ao Root diam: 3.60 cm Ao Asc diam:  3.40 cm MITRAL VALVE MV Area (PHT): 3.85 cm    SHUNTS MV Decel Time: 197 msec    Systemic VTI:  0.22 m MV E velocity: 93.60 cm/s  Systemic Diam: 2.00 cm MV A velocity: 70.40 cm/s MV E/A ratio:  1.33 Fransico Him MD Electronically signed by Fransico Him MD Signature Date/Time: 05/07/2021/3:30:59 PM    Final       Flora Lipps, MD  [facility] 05/08/2021  If 7PM-7AM, please contact night-coverage

## 2021-05-08 NOTE — Telephone Encounter (Signed)
Cancel our order, already done in hospital, thanks

## 2021-05-08 NOTE — Progress Notes (Signed)
TTE reveals no mural thrombus. There is a small bright target on the anterior MV leaflet tip that  likely represents calcific MV disease.   EEG is pending.   Electronically signed: Dr. Kerney Elbe
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
## 2021-05-08 NOTE — Telephone Encounter (Signed)
Wife called in to let Delice Lesch know Casey Greene was admitted to the hospital. They ran test on him and was told to ask doctor about the test that she wanted done. She would like a call back. [phone]

## 2021-05-08 NOTE — Telephone Encounter (Signed)
Pt wife called and informed that the Echocardiogram can be canceled we can not cancel the test in epic because it has been scheduled she stated that she will cancel it,

## 2021-05-08 NOTE — Telephone Encounter (Signed)
Pt had an echocardiogram done in the hospital can we DC the one we have ordered? He is still in the hospital,

## 2021-05-09 ENCOUNTER — Telehealth: Payer: Self-pay

## 2021-05-09 LAB — BASIC METABOLIC PANEL
Anion gap: 8 (ref 5–15)
BUN: 38 mg/dL — ABNORMAL HIGH (ref 8–23)
CO2: 28 mmol/L (ref 22–32)
Calcium: 10.1 mg/dL (ref 8.9–10.3)
Chloride: 104 mmol/L (ref 98–111)
Creatinine, Ser: 2.09 mg/dL — ABNORMAL HIGH (ref 0.61–1.24)
GFR, Estimated: 33 mL/min — ABNORMAL LOW (ref >60)
Glucose, Bld: 104 mg/dL — ABNORMAL HIGH (ref 70–99)
Potassium: 4 mmol/L (ref 3.5–5.1)
Sodium: 140 mmol/L (ref 135–145)

## 2021-05-09 LAB — MAGNESIUM: Magnesium: 2 mg/dL (ref 1.7–2.4)

## 2021-05-09 MED ORDER — ATORVASTATIN CALCIUM 40 MG PO TABS: 40.0000 mg | ORAL_TABLET | Freq: Every day | ORAL | 2 refills | Status: DC

## 2021-05-09 MED ORDER — CEPHALEXIN 500 MG PO CAPS: 500.0000 mg | ORAL_CAPSULE | Freq: Two times a day (BID) | ORAL | 0 refills | Status: AC

## 2021-05-09 MED ORDER — FUROSEMIDE 40 MG PO TABS: 40.0000 mg | ORAL_TABLET | ORAL | Status: DC

## 2021-05-09 NOTE — Care Management Important Message (Signed)
Important Message  Patient Details IM Letter given to the Patient. Name: Casey Greene MRN: [id number] Date of Birth: 1951-09-30   Medicare Important Message Given:  Yes     Kerin Salen 05/09/2021, 9:39 AM

## 2021-05-09 NOTE — Telephone Encounter (Signed)
Patients wife would like a call back regarding husbands Donepezil. She is unsure of strength.she also would like doctor to read notes from discharge paper.

## 2021-05-09 NOTE — Telephone Encounter (Signed)
Spoke with pt wife pt scheduled for hospital f/u tomorrow at 3pm

## 2021-05-09 NOTE — Progress Notes (Signed)
EEG reveals intermittent slowing in the left > right temporal regions. The findings are suggestive of non specific cortical dysfunction arising from left > right temporal region. No seizures or epileptiform discharges were seen throughout the recording.  Images from MRI brain personally reviewed. The chronic small vessel ischemic changes, chronic microhemorrhages and cerebral atrophy seen on MRI would make it more likely for the patient to have EEG findings of nonspecific cortical dysfunction as noted above. The DWI findings mentioned in the report more likely reflect T2 shine through artifact rather than acute strokes.   Neurohospitalist service will sign off. The patient will need outpatient Neurology follow up.   Electronically signed: Dr. Kerney Elbe

## 2021-05-09 NOTE — Discharge Summary (Signed)
Physician Discharge Summary  Casey Greene FXO:[id number] DOB: Apr 27, 1951 DOA: 05/05/2021  PCP: [name], MD  Admit date: 05/05/2021 Discharge date: 05/09/2021  Admitted From: Home  Discharge disposition: Home    Recommendations for Outpatient Follow-Up:   Follow up with your primary care provider as has been scheduled. Check CBC, BMP, magnesium in the next visit   Discharge Diagnosis:   Principal Problem:   Acute encephalopathy Active Problems:   Essential hypertension   Hypokalemia   Chronic diastolic CHF (congestive heart failure) (HCC)   AKI (acute kidney injury) ([place])   Acute cystitis   Discharge Condition: Improved.  Diet recommendation: Low sodium, heart healthy.    Wound care: None.  Code status: Full.   History of Present Illness:   Patient is a 70 year old African-American male with past medical history significant for stage IIIb chronic kidney disease with baseline creatinine 6.0-0.4, chronic diastolic heart failure, essential hypertension, hypercalcemia, chronic anemia with baseline hemoglobin 10-13 and urinary incontinence presented to the hospital with acute encephalopathy and confusion.  He was noted to be volume depleted and urine culture showed E. coli.  He was volume resuscitated and his mentation started improving.  He did have a recent MRI of the brain which revealed subacute infarct.  Neurology was consulted.    Hospital Course:   Following conditions were addressed during hospitalization as listed below,  Acute confusion possible metabolic encephalopathy.:  MRI brain revealed subacute infarct and small vessel infarct.  However MRI findings were not thought to be the reason for this presentation.    Urine culture grew E. coli.  Patient received IV Rocephin during hospitalization.Marland Kitchen  Possible baseline dementia: Patient is known to the local neurologist, [name].  Work-up in progress.  Volume depletion: Improved after volume  replacement.  Diuretics were initially kept on hold.  This will be changed to every other day on discharge   Accelerated hypertension/hypertensive urgency: Improved during hospitalization.  Acute kidney injury on chronic kidney disease stage IIIb: Improved after volume repletion.  Creatinine at 2.0.   Hypokalemia: Likely secondary to diuretic.  Potassium of 3.4 today.  We will continue to replenish orally.  Check levels in a.m.   Hypomagnesemia:  Latest magnesium of 2.0.   Hypercalcemia: Improved after volume replacement.   Chronic diastolic congestive heart failure: Currently compensated.   Urinary incontinence: On oxybutynin as outpatient.   Weakness, debility.  Patient has been seen by physical therapy, recommended home PT on discharge.   Disposition.  At this time, patient is stable for disposition home with outpatient PCP and neurology follow-up.  Medical Consultants:   Neurology.  Procedures:    None Subjective:   Today, patient was seen and examined at bedside. Denies interval complains. No fever, chills or rigor. No dizziness, lightheadedness.   Discharge Exam:   Vitals:   05/08/21 2318 05/09/21 0550  BP: (!) 161/93 119/68  Pulse:  62  Resp:  16  Temp:  98.2 F (36.8 C)  SpO2:  98%   Vitals:   05/08/21 2030 05/08/21 2318 05/09/21 0500 05/09/21 0550  BP: (!) 168/83 (!) 161/93  119/68  Pulse: 71   62  Resp: 18   16  Temp:    98.2 F (36.8 C)  TempSrc:    Oral  SpO2: 97%   98%  Weight:   76.8 kg   Height:        General: Alert awake, not in obvious distress HENT: pupils equally reacting to light,  No scleral pallor  or icterus noted. Oral mucosa is moist.  Chest:  Clear breath sounds.  Diminished breath sounds bilaterally. No crackles or wheezes.  CVS: S1 &S2 heard. No murmur.  Regular rate and rhythm. Abdomen: Soft, nontender, nondistended.  Bowel sounds are heard.   Extremities: No cyanosis, clubbing or edema.  Peripheral pulses are  palpable. Psych: Alert, awake and oriented, normal mood CNS:  No cranial nerve deficits.  Power equal in all extremities.   Skin: Warm and dry.  No rashes noted.  The results of significant diagnostics from this hospitalization (including imaging, microbiology, ancillary and laboratory) are listed below for reference.     Diagnostic Studies:   EEG adult  Result Date: 05/08/2021 Lora Havens, MD     05/08/2021  6:10 PM Patient Name: Casey Greene MRN: [id number] Epilepsy Attending: Lora Havens Referring Physician/Provider: Clance Boll, NP Date: 05/08/2021 Duration: 23.07 mins Patient history: 70 yo male who presented to [facility] yesterday after a 45 min to 1 hr episode of confusion while driving. EEG to evaluate for seizure Level of alertness: Awake AEDs during EEG study: None Technical aspects: This EEG study was done with scalp electrodes positioned according to the 10-20 International system of electrode placement. Electrical activity was acquired at a sampling rate of 500Hz  and reviewed with a high frequency filter of 70Hz  and a low frequency filter of 1Hz . EEG data were recorded continuously and digitally stored. Description: The posterior dominant rhythm consists of 9 Hz activity of moderate voltage (25-35 uV) seen predominantly in posterior head regions, symmetric and reactive to eye opening and eye closing. EEG showed intermittent left>right temporal independent 3 to 6 Hz theta-delta slowing. Hyperventilation and photic stimulation were not performed.   ABNORMALITY - Intermittent slow, left>right temporal region IMPRESSION: This study is suggestive of non specific cortical dysfunction arising from left> right temporal region.  No seizures or epileptiform discharges were seen throughout the recording. [place]     Labs:   Basic Metabolic Panel: Recent Labs  Lab 05/05/21 1441 05/05/21 2150 05/06/21 0706 05/07/21 0708 05/08/21 0548 05/09/21 0531  NA 140  --  141 139 142  140  K 3.2*  --  3.4* 3.2* 3.4* 4.0  CL 103  --  102 104 105 104  CO2 27  --  29 29 30 28   GLUCOSE 104*  --  101* 125* 101* 104*  BUN 53*  --  47* 36* 34* 38*  CREATININE 3.35*  --  2.71* 2.23* 2.09* 2.09*  CALCIUM 10.6*  --  10.3 10.3 10.1 10.1  MG  --  2.1 1.7 1.6* 2.0 2.0  PHOS  --   --  3.0 2.6 2.2*  --    GFR Estimated Creatinine Clearance: 33 mL/min (A) (by C-G formula based on SCr of 2.09 mg/dL (H)). Liver Function Tests: Recent Labs  Lab 05/05/21 1441 05/06/21 0706 05/07/21 0708 05/08/21 0548  AST 22 18  --   --   ALT 11 10  --   --   ALKPHOS 41 38  --   --   BILITOT 0.7 0.7  --   --   PROT 7.4 6.8  --   --   ALBUMIN 4.0 3.7 3.1* 3.2*   No results for input(s): LIPASE, AMYLASE in the last 168 hours. Recent Labs  Lab 05/06/21 0706  AMMONIA 11   Coagulation profile Recent Labs  Lab 05/05/21 1441  INR 1.0    CBC: Recent Labs  Lab 05/05/21 1441 05/06/21 0706  05/07/21 0708  WBC 6.2 5.4 5.2  NEUTROABS 5.0 4.0 4.2  HGB 10.3* 9.3* 9.5*  HCT 31.0* 28.4* 28.9*  MCV 96.3 97.9 96.7  PLT 174 144* 145*   Cardiac Enzymes: No results for input(s): CKTOTAL, CKMB, CKMBINDEX, TROPONINI in the last 168 hours. BNP: Invalid input(s): POCBNP CBG: No results for input(s): GLUCAP in the last 168 hours. D-Dimer No results for input(s): DDIMER in the last 72 hours. Hgb A1c No results for input(s): HGBA1C in the last 72 hours. Lipid Profile No results for input(s): CHOL, HDL, LDLCALC, TRIG, CHOLHDL, LDLDIRECT in the last 72 hours. Thyroid function studies No results for input(s): TSH, T4TOTAL, T3FREE, THYROIDAB in the last 72 hours.  Invalid input(s): FREET3 Anemia work up No results for input(s): VITAMINB12, FOLATE, FERRITIN, TIBC, IRON, RETICCTPCT in the last 72 hours. Microbiology Recent Results (from the past 240 hour(s))  Resp Panel by RT-PCR (Flu A&B, Covid) Nasopharyngeal Swab     Status: None   Collection Time: 05/05/21  2:30 PM   Specimen:  Nasopharyngeal Swab; Nasopharyngeal(NP) swabs in vial transport medium  Result Value Ref Range Status   SARS Coronavirus 2 by RT PCR NEGATIVE NEGATIVE Final    Comment: (NOTE) SARS-CoV-2 target nucleic acids are NOT DETECTED.  The SARS-CoV-2 RNA is generally detectable in upper respiratory specimens during the acute phase of infection. The lowest concentration of SARS-CoV-2 viral copies this assay can detect is 138 copies/mL. A negative result does not preclude SARS-Cov-2 infection and should not be used as the sole basis for treatment or other patient management decisions. A negative result may occur with  improper specimen collection/handling, submission of specimen other than nasopharyngeal swab, presence of viral mutation(s) within the areas targeted by this assay, and inadequate number of viral copies(<138 copies/mL). A negative result must be combined with clinical observations, patient history, and epidemiological information. The expected result is Negative.  Fact Sheet for Patients:  EntrepreneurPulse.com.au  Fact Sheet for Healthcare Providers:  IncredibleEmployment.be  This test is no t yet approved or cleared by the [place] FDA and  has been authorized for detection and/or diagnosis of SARS-CoV-2 by FDA under an Emergency Use Authorization (EUA). This EUA will remain  in effect (meaning this test can be used) for the duration of the COVID-19 declaration under Section 564(b)(1) of the Act, 21 U.S.C.section 360bbb-3(b)(1), unless the authorization is terminated  or revoked sooner.       Influenza A by PCR NEGATIVE NEGATIVE Final   Influenza B by PCR NEGATIVE NEGATIVE Final    Comment: (NOTE) The Xpert Xpress SARS-CoV-2/FLU/RSV plus assay is intended as an aid in the diagnosis of influenza from Nasopharyngeal swab specimens and should not be used as a sole basis for treatment. Nasal washings and aspirates are unacceptable for  Xpert Xpress SARS-CoV-2/FLU/RSV testing.  Fact Sheet for Patients: EntrepreneurPulse.com.au  Fact Sheet for Healthcare Providers: IncredibleEmployment.be  This test is not yet approved or cleared by the [place] FDA and has been authorized for detection and/or diagnosis of SARS-CoV-2 by FDA under an Emergency Use Authorization (EUA). This EUA will remain in effect (meaning this test can be used) for the duration of the COVID-19 declaration under Section 564(b)(1) of the Act, 21 U.S.C. section 360bbb-3(b)(1), unless the authorization is terminated or revoked.  Performed at [facility], [place] [address], [place], [place] [address]   Urine culture     Status: Abnormal   Collection Time: 05/05/21  2:30 PM   Specimen: Urine, Random  Result Value Ref Range Status   Specimen Description   Final    URINE, RANDOM Performed at [facility] [address] [place], [place] [address]    Special Requests   Final    NONE Performed at [facility], [place] [address] [place], [place] [address]    Culture >=100,000 COLONIES/mL ESCHERICHIA COLI (A)  Final   Report Status 05/08/2021 FINAL  Final   Organism ID, Bacteria ESCHERICHIA COLI (A)  Final      Susceptibility   Escherichia coli - MIC*    AMPICILLIN <=2 SENSITIVE Sensitive     CEFAZOLIN <=4 SENSITIVE Sensitive     CEFEPIME <=0.12 SENSITIVE Sensitive     CEFTRIAXONE <=0.25 SENSITIVE Sensitive     CIPROFLOXACIN <=0.25 SENSITIVE Sensitive     GENTAMICIN <=1 SENSITIVE Sensitive     IMIPENEM <=0.25 SENSITIVE Sensitive     NITROFURANTOIN <=16 SENSITIVE Sensitive     TRIMETH/SULFA <=20 SENSITIVE Sensitive     AMPICILLIN/SULBACTAM <=2 SENSITIVE Sensitive     PIP/TAZO <=4 SENSITIVE Sensitive     * >=100,000 COLONIES/mL ESCHERICHIA COLI  Culture, blood (routine x 2)     Status: None (Preliminary result)   Collection Time: 05/05/21  9:50 PM    Specimen: BLOOD RIGHT FOREARM  Result Value Ref Range Status   Specimen Description   Final    BLOOD RIGHT FOREARM Performed at [facility], [address], [place], [place] [address]    Special Requests   Final    BOTTLES DRAWN AEROBIC AND ANAEROBIC Blood Culture adequate volume Performed at [facility] [address], [place], [place] [address]    Culture   Final    NO GROWTH 3 DAYS Performed at [facility], [place] [address], [place], [place] [address]    Report Status PENDING  Incomplete  Culture, blood (routine x 2)     Status: None (Preliminary result)   Collection Time: 05/06/21  7:06 AM   Specimen: BLOOD  Result Value Ref Range Status   Specimen Description   Final    BLOOD SITE NOT SPECIFIED Performed at [facility] [address] [place], [place] [address]    Special Requests   Final    BOTTLES DRAWN AEROBIC ONLY Blood Culture adequate volume Performed at [facility] [address] [place], [place] [address]    Culture   Final    NO GROWTH 3 DAYS Performed at [facility], [place] [address] [place],  [address]    Report Status PENDING  Incomplete     Discharge Instructions:   Discharge Instructions     Diet general   Complete by: As directed    Discharge instructions   Complete by: As directed    Follow-up with your primary care physician which has been scheduled.  check blood work at that time.  Follow-up with your neurologist as outpatient.   Increase activity slowly   Complete by: As directed       Allergies as of 05/09/2021       Reactions   Amlodipine Other (See Comments)   headache   Tamsulosin Hcl         Medication List     TAKE these medications    aspirin EC 81 MG tablet Take 81 mg by mouth daily. Swallow whole.   atorvastatin 40 MG tablet Commonly known as: LIPITOR Take 1 tablet (40 mg total) by mouth at  bedtime.   carvedilol 6.25 MG  tablet Commonly known as: COREG Take 1 tablet (6.25 mg total) by mouth 2 (two) times daily with a meal.   cephALEXin 500 MG capsule Commonly known as: KEFLEX Take 1 capsule (500 mg total) by mouth every 12 (twelve) hours for 5 days.   clopidogrel 75 MG tablet Commonly known as: PLAVIX Take 1 tablet (75 mg total) by mouth daily.   ferrous sulfate 325 (65 FE) MG tablet Take 325 mg by mouth daily with breakfast.   furosemide 40 MG tablet Commonly known as: LASIX Take 1 tablet (40 mg total) by mouth every other day. What changed: when to take this   hydrALAZINE 100 MG tablet Commonly known as: APRESOLINE Take 1 tablet (100 mg total) by mouth 3 (three) times daily. What changed: how much to take   losartan 50 MG tablet Commonly known as: COZAAR Take 1 tablet (50 mg total) by mouth daily.   oxybutynin 5 MG tablet Commonly known as: DITROPAN Take 1 tablet (5 mg total) by mouth at bedtime.   vitamin C 500 MG tablet Commonly known as: ASCORBIC ACID Take 500 mg by mouth daily.       ASK your doctor about these medications    donepezil 10 MG tablet Commonly known as: ARICEPT Take 1/2 tablet daily for 2 weeks, then increase to 1 tablet daily         Time coordinating discharge: 39 minutes  Signed:  Mykela Mewborn  [facility] 05/09/2021, 8:55 AM

## 2021-05-09 NOTE — Telephone Encounter (Signed)
He had been on Donepezil 10mg  daily in the hospital. Pls let her know I have reviewed notes. Is he home? Can they come in tomorrow afternoon for f/u? Thanks

## 2021-05-10 ENCOUNTER — Ambulatory Visit: Payer: Medicare Other | Admitting: Neurology

## 2021-05-10 ENCOUNTER — Telehealth: Payer: Self-pay | Admitting: *Deleted

## 2021-05-10 NOTE — Telephone Encounter (Signed)
Transition Care Management Unsuccessful Follow-up Telephone Call  Date of discharge and from where:  [facility] 05-09-2021  Attempts:  1st Attempt  Reason for unsuccessful TCM follow-up call:  Left voice message

## 2021-05-10 NOTE — Progress Notes (Deleted)
Patient no-showed 05/10/21 visit

## 2021-05-10 NOTE — Telephone Encounter (Signed)
Casey Greene and wife came in today but they were late. I resch them for July. She would like a call from doctor with results from hospital visit. She said she has not got a clear understanding.

## 2021-05-11 ENCOUNTER — Ambulatory Visit (HOSPITAL_COMMUNITY): Payer: Medicare Other

## 2021-05-11 ENCOUNTER — Telehealth: Payer: Self-pay | Admitting: Neurology

## 2021-05-11 ENCOUNTER — Other Ambulatory Visit: Payer: Self-pay

## 2021-05-11 DIAGNOSIS — I639 Cerebral infarction, unspecified: Secondary | ICD-10-CM

## 2021-05-11 LAB — CULTURE, BLOOD (ROUTINE X 2)
Culture: NO GROWTH
Culture: NO GROWTH
Special Requests: ADEQUATE
Special Requests: ADEQUATE

## 2021-05-11 LAB — METHYLMALONIC ACID, SERUM: Methylmalonic Acid, Quantitative: 583 nmol/L — ABNORMAL HIGH (ref 0–378)

## 2021-05-11 NOTE — Telephone Encounter (Signed)
Order placed for heart monitor

## 2021-05-11 NOTE — Telephone Encounter (Signed)
Starting to notice some changes since he has been home, he says he is alright but he is snippy a lot, gets upset when he cannot explain something or does not understand what she means. He missed medicine (antibiotic and cholesterol) the first night. He drinks water constantly so she does not understand why he was told he was dehydrated. They have a BP monitor at home (wrist), BPs are usually 120-140/60-70. Discussed MRI brain showing new subcentimeter foci of diffusion abnormality in the splenium, left posterior periatrial and subcortical white matter. Discussed that although poorly controlled hypertension may be a factor, would rule out atrial fibrillation due to strokes in different vascular distributions, order 30-day holter monitor. She notes he has difficulty breathing periodically. Discussed starting an SSRI for mood changes, she would like to hold off for now. Continue Donepezil 10mg  daily. She was advised to administer medications moving forward and no further driving. She has joined AD support groups.

## 2021-05-12 ENCOUNTER — Encounter: Payer: Self-pay | Admitting: Family Medicine

## 2021-05-12 ENCOUNTER — Ambulatory Visit (INDEPENDENT_AMBULATORY_CARE_PROVIDER_SITE_OTHER): Payer: Medicare Other | Admitting: Family Medicine

## 2021-05-12 VITALS — BP 118/66 | HR 76 | Temp 98.2°F | Ht 70.0 in | Wt 165.2 lb

## 2021-05-12 DIAGNOSIS — N3000 Acute cystitis without hematuria: Secondary | ICD-10-CM | POA: Diagnosis not present

## 2021-05-12 DIAGNOSIS — N179 Acute kidney failure, unspecified: Secondary | ICD-10-CM

## 2021-05-12 DIAGNOSIS — N3945 Continuous leakage: Secondary | ICD-10-CM | POA: Diagnosis not present

## 2021-05-12 DIAGNOSIS — Z8673 Personal history of transient ischemic attack (TIA), and cerebral infarction without residual deficits: Secondary | ICD-10-CM | POA: Diagnosis not present

## 2021-05-12 DIAGNOSIS — D638 Anemia in other chronic diseases classified elsewhere: Secondary | ICD-10-CM | POA: Diagnosis not present

## 2021-05-12 DIAGNOSIS — I639 Cerebral infarction, unspecified: Secondary | ICD-10-CM
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
## 2021-05-13 ENCOUNTER — Encounter: Payer: Self-pay | Admitting: Family Medicine

## 2021-05-15 DIAGNOSIS — N189 Chronic kidney disease, unspecified: Secondary | ICD-10-CM | POA: Diagnosis not present

## 2021-05-15 DIAGNOSIS — N183 Chronic kidney disease, stage 3 unspecified: Secondary | ICD-10-CM | POA: Diagnosis not present

## 2021-05-16 ENCOUNTER — Other Ambulatory Visit: Payer: Medicare Other

## 2021-05-16 ENCOUNTER — Other Ambulatory Visit: Payer: Self-pay

## 2021-05-16 DIAGNOSIS — I639 Cerebral infarction, unspecified: Secondary | ICD-10-CM

## 2021-05-16 DIAGNOSIS — N179 Acute kidney failure, unspecified: Secondary | ICD-10-CM

## 2021-05-16 NOTE — Addendum Note (Signed)
Addended by: [name] on: 05/16/2021 08:27 AM   Modules accepted: Orders

## 2021-05-17 ENCOUNTER — Other Ambulatory Visit (INDEPENDENT_AMBULATORY_CARE_PROVIDER_SITE_OTHER): Payer: Medicare Other

## 2021-05-17 ENCOUNTER — Encounter: Payer: Self-pay | Admitting: Family Medicine

## 2021-05-17 DIAGNOSIS — I639 Cerebral infarction, unspecified: Secondary | ICD-10-CM

## 2021-05-17 LAB — BASIC METABOLIC PANEL
BUN: 61 mg/dL — ABNORMAL HIGH (ref 6–23)
CO2: 28 mEq/L (ref 19–32)
Calcium: 10.1 mg/dL (ref 8.4–10.5)
Chloride: 99 mEq/L (ref 96–112)
Creatinine, Ser: 2.99 mg/dL — ABNORMAL HIGH (ref 0.40–1.50)
GFR: 20.22 mL/min — ABNORMAL LOW (ref >60.00)
Glucose, Bld: 115 mg/dL — ABNORMAL HIGH (ref 70–99)
Potassium: 3.7 mEq/L (ref 3.5–5.1)
Sodium: 136 mEq/L (ref 135–145)

## 2021-05-17 LAB — CBC
HCT: 27.3 % — ABNORMAL LOW (ref 39.0–52.0)
Hemoglobin: 9.4 g/dL — ABNORMAL LOW (ref 13.0–17.0)
MCHC: 34.4 g/dL (ref 30.0–36.0)
MCV: 92.1 fl (ref 78.0–100.0)
Platelets: 185 10*3/uL (ref 150.0–400.0)
RBC: 2.96 Mil/uL — ABNORMAL LOW (ref 4.22–5.81)
RDW: 13.6 % (ref 11.5–15.5)
WBC: 5.6 10*3/uL (ref 4.0–10.5)

## 2021-05-17 NOTE — Addendum Note (Signed)
Addended by: Beryle Lathe S on: 05/17/2021 10:39 AM   Modules accepted: Orders

## 2021-05-17 NOTE — Progress Notes (Signed)
Per orders of Dr. Gena Fray pt is here for labs, pt tolerated draw well.

## 2021-05-18 ENCOUNTER — Telehealth: Payer: Self-pay | Admitting: Cardiology

## 2021-05-18 NOTE — Telephone Encounter (Signed)
Returned call. Patients monitor is out for delivery and should arrive by 7pm tonight.

## 2021-05-18 NOTE — Telephone Encounter (Signed)
Patient's wife states a monitor was mailed out a week ago, but they have not received it yet. She would like to know if there is a hold up on it. She states if she does not answer to leave a message.

## 2021-05-19 ENCOUNTER — Inpatient Hospital Stay (INDEPENDENT_AMBULATORY_CARE_PROVIDER_SITE_OTHER): Payer: Medicare Other

## 2021-05-19 ENCOUNTER — Encounter: Payer: Self-pay | Admitting: Family Medicine

## 2021-05-19 ENCOUNTER — Ambulatory Visit (INDEPENDENT_AMBULATORY_CARE_PROVIDER_SITE_OTHER): Payer: Medicare Other | Admitting: Family Medicine

## 2021-05-19 ENCOUNTER — Other Ambulatory Visit: Payer: Self-pay

## 2021-05-19 VITALS — BP 116/68 | HR 77 | Temp 98.3°F | Ht 70.0 in | Wt 163.0 lb

## 2021-05-19 DIAGNOSIS — N183 Chronic kidney disease, stage 3 unspecified: Secondary | ICD-10-CM | POA: Diagnosis not present

## 2021-05-19 DIAGNOSIS — N390 Urinary tract infection, site not specified: Secondary | ICD-10-CM | POA: Diagnosis not present

## 2021-05-19 DIAGNOSIS — D631 Anemia in chronic kidney disease: Secondary | ICD-10-CM | POA: Diagnosis not present

## 2021-05-19 DIAGNOSIS — I499 Cardiac arrhythmia, unspecified: Secondary | ICD-10-CM

## 2021-05-19 DIAGNOSIS — D351 Benign neoplasm of parathyroid gland: Secondary | ICD-10-CM | POA: Diagnosis not present

## 2021-05-19 DIAGNOSIS — I639 Cerebral infarction, unspecified: Secondary | ICD-10-CM

## 2021-05-19 DIAGNOSIS — I4891 Unspecified atrial fibrillation: Secondary | ICD-10-CM | POA: Diagnosis not present

## 2021-05-19 DIAGNOSIS — K627 Radiation proctitis: Secondary | ICD-10-CM | POA: Diagnosis not present

## 2021-05-19 DIAGNOSIS — N184 Chronic kidney disease, stage 4 (severe): Secondary | ICD-10-CM | POA: Diagnosis not present

## 2021-05-19 DIAGNOSIS — I129 Hypertensive chronic kidney disease with stage 1 through stage 4 chronic kidney disease, or unspecified chronic kidney disease: Secondary | ICD-10-CM | POA: Diagnosis not present

## 2021-05-19 MED ORDER — SUCRALFATE 1 GM/10ML PO SUSP: 2.0000 g | Freq: Two times a day (BID) | ORAL | 2 refills | Status: DC

## 2021-05-19 NOTE — Progress Notes (Signed)
[facility] [address] [address] Dept: [phone] Dept Fax: [phone]  Office Visit  Subjective:    Patient ID: Casey Greene, male    DOB: 1951-07-22, 70 y.o..   MRN: [id number]  Chief Complaint  Patient presents with   Follow-up    F/u c/o having blood is stool couple instances.       History of Present Illness:  Patient is in today for evaluation of several recent episodes of rectal bleeding. He has not had pain associated with these. [name] has had prior radiation therapy for prostate cancer. He had an evaluation for hematochezia in 09/2019 and was found on colonoscopy to have radiation proctitis. There was no evidence at the time of hemorrhoids.  Also, [name] has received a package in the mail for a 30-day Holter monitor study. His wife was unsure about how to apply the unit.  Past Medical History: Patient Active Problem List   Diagnosis Date Noted   Radiation proctitis 05/19/2021   AKI (acute kidney injury) ([place]) 05/06/2021   Acute cystitis 05/06/2021   Acute encephalopathy 05/05/2021   Ceruminosis, left 02/10/2021   Chronic diastolic CHF (congestive heart failure) ([place]) 02/03/2021   Urinary retention    Mild neurocognitive disorder due to multiple etiologies 12/30/2020   PAC (premature atrial contraction)    Stroke    History of urinary frequency 12/05/2020   History of gout    Dysrhythmia    Decreased appetite    Laryngeal mass 06/17/2020   DOE (dyspnea on exertion) 06/17/2020   Localized osteoarthritis of left knee 06/11/2020   LVH (left ventricular hypertrophy) due to hypertensive disease    Ascending aorta dilation 07/29/2019   Nonsustained ventricular tachycardia 07/23/2019   Elevated hemoglobin A1c 06/11/2019   Corn or callus 06/11/2019   Hypokalemia 12/09/2018   Constipation 12/01/2018   Mass of thyroid region 03/28/2018   Anemia of chronic disease 03/28/2018   Edema  07/09/2016   Major depressive disorder 02/27/2016   CKD (chronic kidney disease) stage 3, GFR 30-59 ml/min 10/13/2014   Hepatitis C virus infection cured after antiviral drug therapy 07/05/2014   Hyperlipidemia 06/29/2014   Prostate cancer 06/09/2014   Essential hypertension 06/09/2014   Onychomycosis 12/01/2013   PVD (peripheral vascular disease) ([place]) 12/01/2013   Past Surgical History:  Procedure Laterality Date   ACHILLES TENDON REPAIR     CERVICAL [place] SURGERY     neck   COLONOSCOPY     before 2010    LYMPHADENECTOMY Bilateral 01/26/2016   Procedure: EXTENDED BILATERAL PELVIC LYMPHADENECTOMY;  Surgeon: Raynelle Bring, MD;  Location: [facility];  Service: Urology;  Laterality: Bilateral;   NOSE SURGERY     in high school-nasal fracturex2   ROBOT ASSISTED LAPAROSCOPIC RADICAL PROSTATECTOMY N/A 01/26/2016   Procedure: XI ROBOTIC ASSISTED LAPAROSCOPIC RADICAL PROSTATECTOMY LEVEL 3;  Surgeon: Raynelle Bring, MD;  Location: [facility];  Service: Urology;  Laterality: N/A;   Family History  Problem Relation Age of Onset   Heart disease Mother    Obesity Mother    Cervical cancer Mother    Dementia Mother        unspecified type; symptom onset in late 60s/early 59s   Heart disease Father    Diabetes Father    Stroke Father    ALS Sister    Stroke Paternal Grandmother    Stroke Paternal Grandfather    Colon cancer Neg Hx    Esophageal cancer Neg Hx  Colon polyps Neg Hx    Rectal cancer Neg Hx    Stomach cancer Neg Hx    Outpatient Medications Prior to Visit  Medication Sig Dispense Refill   aspirin EC 81 MG tablet Take 81 mg by mouth daily. Swallow whole.     atorvastatin (LIPITOR) 40 MG tablet Take 1 tablet (40 mg total) by mouth at bedtime. 30 tablet 2   carvedilol (COREG) 6.25 MG tablet Take 1 tablet (6.25 mg total) by mouth 2 (two) times daily with a meal. 180 tablet 0   clopidogrel (PLAVIX) 75 MG tablet Take 1 tablet (75 mg total) by mouth daily. 30 tablet 11   donepezil  (ARICEPT) 10 MG tablet Take 1/2 tablet daily for 2 weeks, then increase to 1 tablet daily (Patient taking differently: Take 10 mg by mouth in the morning.) 30 tablet 11   ferrous sulfate 325 (65 FE) MG tablet Take 325 mg by mouth daily with breakfast.     furosemide (LASIX) 40 MG tablet Take 1 tablet (40 mg total) by mouth every other day.     hydrALAZINE (APRESOLINE) 100 MG tablet Take 1 tablet (100 mg total) by mouth 3 (three) times daily. 90 tablet 3   losartan (COZAAR) 50 MG tablet Take 1 tablet (50 mg total) by mouth daily. 10 tablet 0   Omega-3 1000 MG CAPS Take 1 capsule by mouth daily.     OVER THE COUNTER MEDICATION Take 1 capsule by mouth daily. Bladder Support formula ([place], [place], Cretavox)     oxybutynin (DITROPAN) 5 MG tablet Take 1 tablet (5 mg total) by mouth at bedtime. 90 tablet 0   vitamin C (ASCORBIC ACID) 500 MG tablet Take 500 mg by mouth daily.     No facility-administered medications prior to visit.   Allergies  Allergen Reactions   Amlodipine Other (See Comments)    headache   Tamsulosin Hcl     Objective:   Today's Vitals   05/19/21 1459  BP: 116/68  Pulse: 77  Temp: 98.3 F (36.8 C)  TempSrc: Temporal  SpO2: 98%  Weight: 163 lb (73.9 kg)  Height: 5\' 10"  (1.778 m)   Body mass index is 23.39 kg/m.   General: Well developed, well nourished. No acute distress. Psych: Alert and oriented x3. Normal mood and affect.  Health Maintenance Due  Topic Date Due   TETANUS/TDAP  Never done   Zoster Vaccines- Shingrix (1 of 2) Never done   COVID-19 Vaccine (3 - Janssen risk series) 01/06/2021     Assessment & Plan:   1. Radiation proctitis Based on his history, Mr. Arviso is likely have a return of bleeding associated with radiation proctitis. He does not take a stool softener, preferring to try and eat regular fruits and vegetables. He admits he occasionally slips up with his diet and does have to strain with bowel movements, but does not like using  stool softeners. I provided him with printed instructions of how to do Carafate enemas. I recommend he do these twice a day for two weeks and then PRN afterwards.  - sucralfate (CARAFATE) 1 GM/10ML suspension; Place 20 mLs (2 g total) rectally 2 (two) times daily. Mix 2 gm (20 ml) with 10 ml of warm water. Instill rectally using an enema bag.  Dispense: 420 mL; Refill: 2  2. Cardiac arrhythmia, unspecified cardiac arrhythmia type Ms. Moffitt (MA) assisted Mr. Deike and his wife in attaching his monitor and activating the recorder.  [name], MD

## 2021-05-22 ENCOUNTER — Other Ambulatory Visit: Payer: Self-pay

## 2021-05-22 ENCOUNTER — Telehealth: Payer: Self-pay | Admitting: Family Medicine

## 2021-05-22 DIAGNOSIS — K627 Radiation proctitis: Secondary | ICD-10-CM

## 2021-05-22 MED ORDER — DONEPEZIL HCL 10 MG PO TABS: 10.0000 mg | ORAL_TABLET | Freq: Every morning | ORAL | 1 refills | Status: DC

## 2021-05-22 NOTE — Telephone Encounter (Signed)
Lft VM to rtn call. Dm/cma  

## 2021-05-22 NOTE — Addendum Note (Signed)
Addended by: [name] on: 05/22/2021 05:18 PM   Modules accepted: Orders

## 2021-05-22 NOTE — Telephone Encounter (Signed)
Pt's wife is calling in concerned with the script her husband got on Friday(05/19/21) sucralfate (CARAFATE) 1 GM/10ML suspension [[id number]]. Please advise pt's wife.

## 2021-05-22 NOTE — Telephone Encounter (Signed)
Spoke to patient's wife, [name], the medication Sucralfate is too expensive ($100)  is there anything else that can be sent to the pharmacy. Please review and advise.  Thanks. Dm/cma

## 2021-05-23 NOTE — Telephone Encounter (Signed)
Spoke to wife and advised that a referral would be placed to go back to see GI doctor.  No further questions. Dm/cma

## 2021-05-28 ENCOUNTER — Inpatient Hospital Stay (HOSPITAL_COMMUNITY)
Admission: EM | Admit: 2021-05-28 | Discharge: 2021-06-16 | DRG: 064 | Disposition: A | Discharge status: Skilled Nursing Facility | Payer: Medicare Other | Attending: Family Medicine | Admitting: Family Medicine

## 2021-05-28 ENCOUNTER — Emergency Department (HOSPITAL_COMMUNITY): Payer: Medicare Other

## 2021-05-28 ENCOUNTER — Encounter (HOSPITAL_COMMUNITY): Payer: Self-pay | Admitting: Student in an Organized Health Care Education/Training Program

## 2021-05-28 ENCOUNTER — Other Ambulatory Visit: Payer: Self-pay | Admitting: Family Medicine

## 2021-05-28 ENCOUNTER — Inpatient Hospital Stay (HOSPITAL_COMMUNITY): Payer: Medicare Other

## 2021-05-28 ENCOUNTER — Other Ambulatory Visit: Payer: Self-pay

## 2021-05-28 DIAGNOSIS — R1313 Dysphagia, pharyngeal phase: Secondary | ICD-10-CM | POA: Diagnosis present

## 2021-05-28 DIAGNOSIS — R4701 Aphasia: Secondary | ICD-10-CM | POA: Diagnosis present

## 2021-05-28 DIAGNOSIS — Z6823 Body mass index (BMI) 23.0-23.9, adult: Secondary | ICD-10-CM

## 2021-05-28 DIAGNOSIS — R471 Dysarthria and anarthria: Secondary | ICD-10-CM | POA: Diagnosis present

## 2021-05-28 DIAGNOSIS — I619 Nontraumatic intracerebral hemorrhage, unspecified: Secondary | ICD-10-CM | POA: Diagnosis present

## 2021-05-28 DIAGNOSIS — R29810 Facial weakness: Secondary | ICD-10-CM | POA: Diagnosis present

## 2021-05-28 DIAGNOSIS — N184 Chronic kidney disease, stage 4 (severe): Secondary | ICD-10-CM | POA: Diagnosis not present

## 2021-05-28 DIAGNOSIS — N183 Chronic kidney disease, stage 3 unspecified: Secondary | ICD-10-CM | POA: Diagnosis not present

## 2021-05-28 DIAGNOSIS — Z751 Person awaiting admission to adequate facility elsewhere: Secondary | ICD-10-CM

## 2021-05-28 DIAGNOSIS — I69154 Hemiplegia and hemiparesis following nontraumatic intracerebral hemorrhage affecting left non-dominant side: Secondary | ICD-10-CM | POA: Diagnosis not present

## 2021-05-28 DIAGNOSIS — D638 Anemia in other chronic diseases classified elsewhere: Secondary | ICD-10-CM | POA: Diagnosis present

## 2021-05-28 DIAGNOSIS — Z431 Encounter for attention to gastrostomy: Secondary | ICD-10-CM | POA: Diagnosis not present

## 2021-05-28 DIAGNOSIS — Z79899 Other long term (current) drug therapy: Secondary | ICD-10-CM

## 2021-05-28 DIAGNOSIS — R339 Retention of urine, unspecified: Secondary | ICD-10-CM | POA: Diagnosis not present

## 2021-05-28 DIAGNOSIS — R410 Disorientation, unspecified: Secondary | ICD-10-CM | POA: Diagnosis not present

## 2021-05-28 DIAGNOSIS — Z7982 Long term (current) use of aspirin: Secondary | ICD-10-CM

## 2021-05-28 DIAGNOSIS — R29898 Other symptoms and signs involving the musculoskeletal system: Secondary | ICD-10-CM | POA: Diagnosis not present

## 2021-05-28 DIAGNOSIS — T17908D Unspecified foreign body in respiratory tract, part unspecified causing other injury, subsequent encounter: Secondary | ICD-10-CM | POA: Diagnosis not present

## 2021-05-28 DIAGNOSIS — I130 Hypertensive heart and chronic kidney disease with heart failure and stage 1 through stage 4 chronic kidney disease, or unspecified chronic kidney disease: Secondary | ICD-10-CM | POA: Diagnosis not present

## 2021-05-28 DIAGNOSIS — I161 Hypertensive emergency: Secondary | ICD-10-CM | POA: Diagnosis not present

## 2021-05-28 DIAGNOSIS — F0280 Dementia in other diseases classified elsewhere without behavioral disturbance: Secondary | ICD-10-CM | POA: Diagnosis present

## 2021-05-28 DIAGNOSIS — R509 Fever, unspecified: Secondary | ICD-10-CM | POA: Diagnosis not present

## 2021-05-28 DIAGNOSIS — I629 Nontraumatic intracranial hemorrhage, unspecified: Secondary | ICD-10-CM | POA: Diagnosis not present

## 2021-05-28 DIAGNOSIS — Z833 Family history of diabetes mellitus: Secondary | ICD-10-CM

## 2021-05-28 DIAGNOSIS — E860 Dehydration: Secondary | ICD-10-CM | POA: Diagnosis not present

## 2021-05-28 DIAGNOSIS — G8194 Hemiplegia, unspecified affecting left nondominant side: Secondary | ICD-10-CM | POA: Diagnosis present

## 2021-05-28 DIAGNOSIS — N179 Acute kidney failure, unspecified: Secondary | ICD-10-CM | POA: Diagnosis not present

## 2021-05-28 DIAGNOSIS — J9601 Acute respiratory failure with hypoxia: Secondary | ICD-10-CM | POA: Diagnosis not present

## 2021-05-28 DIAGNOSIS — Z823 Family history of stroke: Secondary | ICD-10-CM

## 2021-05-28 DIAGNOSIS — I700 Atherosclerosis of aorta: Secondary | ICD-10-CM | POA: Diagnosis not present

## 2021-05-28 DIAGNOSIS — Z8673 Personal history of transient ischemic attack (TIA), and cerebral infarction without residual deficits: Secondary | ICD-10-CM

## 2021-05-28 DIAGNOSIS — E871 Hypo-osmolality and hyponatremia: Secondary | ICD-10-CM | POA: Diagnosis not present

## 2021-05-28 DIAGNOSIS — E878 Other disorders of electrolyte and fluid balance, not elsewhere classified: Secondary | ICD-10-CM | POA: Diagnosis not present

## 2021-05-28 DIAGNOSIS — Z818 Family history of other mental and behavioral disorders: Secondary | ICD-10-CM

## 2021-05-28 DIAGNOSIS — Z20822 Contact with and (suspected) exposure to covid-19: Secondary | ICD-10-CM | POA: Diagnosis present

## 2021-05-28 DIAGNOSIS — R414 Neurologic neglect syndrome: Secondary | ICD-10-CM | POA: Diagnosis present

## 2021-05-28 DIAGNOSIS — N189 Chronic kidney disease, unspecified: Secondary | ICD-10-CM | POA: Diagnosis present

## 2021-05-28 DIAGNOSIS — E785 Hyperlipidemia, unspecified: Secondary | ICD-10-CM | POA: Diagnosis not present

## 2021-05-28 DIAGNOSIS — G3184 Mild cognitive impairment, so stated: Secondary | ICD-10-CM | POA: Diagnosis present

## 2021-05-28 DIAGNOSIS — I615 Nontraumatic intracerebral hemorrhage, intraventricular: Secondary | ICD-10-CM | POA: Diagnosis present

## 2021-05-28 DIAGNOSIS — E1151 Type 2 diabetes mellitus with diabetic peripheral angiopathy without gangrene: Secondary | ICD-10-CM | POA: Diagnosis not present

## 2021-05-28 DIAGNOSIS — K921 Melena: Secondary | ICD-10-CM | POA: Diagnosis not present

## 2021-05-28 DIAGNOSIS — D631 Anemia in chronic kidney disease: Secondary | ICD-10-CM | POA: Diagnosis present

## 2021-05-28 DIAGNOSIS — Z4659 Encounter for fitting and adjustment of other gastrointestinal appliance and device: Secondary | ICD-10-CM

## 2021-05-28 DIAGNOSIS — G309 Alzheimer's disease, unspecified: Secondary | ICD-10-CM | POA: Diagnosis present

## 2021-05-28 DIAGNOSIS — G936 Cerebral edema: Secondary | ICD-10-CM | POA: Diagnosis not present

## 2021-05-28 DIAGNOSIS — I618 Other nontraumatic intracerebral hemorrhage: Principal | ICD-10-CM | POA: Diagnosis present

## 2021-05-28 DIAGNOSIS — K439 Ventral hernia without obstruction or gangrene: Secondary | ICD-10-CM | POA: Diagnosis not present

## 2021-05-28 DIAGNOSIS — E870 Hyperosmolality and hypernatremia: Secondary | ICD-10-CM | POA: Diagnosis not present

## 2021-05-28 DIAGNOSIS — Z8619 Personal history of other infectious and parasitic diseases: Secondary | ICD-10-CM

## 2021-05-28 DIAGNOSIS — Z66 Do not resuscitate: Secondary | ICD-10-CM | POA: Diagnosis not present

## 2021-05-28 DIAGNOSIS — R0602 Shortness of breath: Secondary | ICD-10-CM | POA: Diagnosis not present

## 2021-05-28 DIAGNOSIS — Z8049 Family history of malignant neoplasm of other genital organs: Secondary | ICD-10-CM

## 2021-05-28 DIAGNOSIS — I610 Nontraumatic intracerebral hemorrhage in hemisphere, subcortical: Secondary | ICD-10-CM | POA: Diagnosis not present

## 2021-05-28 DIAGNOSIS — I739 Peripheral vascular disease, unspecified: Secondary | ICD-10-CM | POA: Diagnosis not present

## 2021-05-28 DIAGNOSIS — R29706 NIHSS score 6: Secondary | ICD-10-CM | POA: Diagnosis present

## 2021-05-28 DIAGNOSIS — G910 Communicating hydrocephalus: Secondary | ICD-10-CM | POA: Diagnosis not present

## 2021-05-28 DIAGNOSIS — I69191 Dysphagia following nontraumatic intracerebral hemorrhage: Secondary | ICD-10-CM | POA: Diagnosis not present

## 2021-05-28 DIAGNOSIS — J690 Pneumonitis due to inhalation of food and vomit: Secondary | ICD-10-CM | POA: Diagnosis not present

## 2021-05-28 DIAGNOSIS — G9341 Metabolic encephalopathy: Secondary | ICD-10-CM | POA: Diagnosis not present

## 2021-05-28 DIAGNOSIS — E1122 Type 2 diabetes mellitus with diabetic chronic kidney disease: Secondary | ICD-10-CM | POA: Diagnosis present

## 2021-05-28 DIAGNOSIS — I10 Essential (primary) hypertension: Secondary | ICD-10-CM | POA: Diagnosis not present

## 2021-05-28 DIAGNOSIS — Z8546 Personal history of malignant neoplasm of prostate: Secondary | ICD-10-CM

## 2021-05-28 DIAGNOSIS — Z8249 Family history of ischemic heart disease and other diseases of the circulatory system: Secondary | ICD-10-CM

## 2021-05-28 DIAGNOSIS — E43 Unspecified severe protein-calorie malnutrition: Secondary | ICD-10-CM | POA: Diagnosis present

## 2021-05-28 DIAGNOSIS — R1310 Dysphagia, unspecified: Secondary | ICD-10-CM | POA: Diagnosis not present

## 2021-05-28 DIAGNOSIS — L720 Epidermal cyst: Secondary | ICD-10-CM | POA: Diagnosis not present

## 2021-05-28 DIAGNOSIS — R220 Localized swelling, mass and lump, head: Secondary | ICD-10-CM | POA: Diagnosis not present

## 2021-05-28 DIAGNOSIS — T17908A Unspecified foreign body in respiratory tract, part unspecified causing other injury, initial encounter: Secondary | ICD-10-CM | POA: Diagnosis not present

## 2021-05-28 DIAGNOSIS — I5032 Chronic diastolic (congestive) heart failure: Secondary | ICD-10-CM | POA: Diagnosis present

## 2021-05-28 DIAGNOSIS — Z7401 Bed confinement status: Secondary | ICD-10-CM | POA: Diagnosis not present

## 2021-05-28 DIAGNOSIS — I517 Cardiomegaly: Secondary | ICD-10-CM | POA: Diagnosis not present

## 2021-05-28 DIAGNOSIS — R4182 Altered mental status, unspecified: Secondary | ICD-10-CM | POA: Diagnosis not present

## 2021-05-28 DIAGNOSIS — Z7189 Other specified counseling: Secondary | ICD-10-CM | POA: Diagnosis not present

## 2021-05-28 DIAGNOSIS — G459 Transient cerebral ischemic attack, unspecified: Secondary | ICD-10-CM | POA: Diagnosis not present

## 2021-05-28 DIAGNOSIS — Z4682 Encounter for fitting and adjustment of non-vascular catheter: Secondary | ICD-10-CM | POA: Diagnosis not present

## 2021-05-28 DIAGNOSIS — G911 Obstructive hydrocephalus: Secondary | ICD-10-CM | POA: Diagnosis present

## 2021-05-28 DIAGNOSIS — Z7902 Long term (current) use of antithrombotics/antiplatelets: Secondary | ICD-10-CM

## 2021-05-28 DIAGNOSIS — I639 Cerebral infarction, unspecified: Secondary | ICD-10-CM | POA: Diagnosis not present

## 2021-05-28 DIAGNOSIS — Z87898 Personal history of other specified conditions: Secondary | ICD-10-CM

## 2021-05-28 DIAGNOSIS — R531 Weakness: Secondary | ICD-10-CM | POA: Diagnosis not present

## 2021-05-28 DIAGNOSIS — Z515 Encounter for palliative care: Secondary | ICD-10-CM | POA: Diagnosis not present

## 2021-05-28 DIAGNOSIS — Z87891 Personal history of nicotine dependence: Secondary | ICD-10-CM

## 2021-05-28 DIAGNOSIS — Z9079 Acquired absence of other genital organ(s): Secondary | ICD-10-CM

## 2021-05-28 DIAGNOSIS — G4489 Other headache syndrome: Secondary | ICD-10-CM | POA: Diagnosis not present

## 2021-05-28 DIAGNOSIS — Z743 Need for continuous supervision: Secondary | ICD-10-CM | POA: Diagnosis not present

## 2021-05-28 DIAGNOSIS — G919 Hydrocephalus, unspecified: Secondary | ICD-10-CM | POA: Diagnosis not present

## 2021-05-28 LAB — DIFFERENTIAL
Abs Immature Granulocytes: 0.02 10*3/uL (ref 0.00–0.07)
Basophils Absolute: 0 10*3/uL (ref 0.0–0.1)
Basophils Relative: 0 %
Eosinophils Absolute: 0 10*3/uL (ref 0.0–0.5)
Eosinophils Relative: 0 %
Immature Granulocytes: 0 %
Lymphocytes Relative: 4 %
Lymphs Abs: 0.3 10*3/uL — ABNORMAL LOW (ref 0.7–4.0)
Monocytes Absolute: 0.2 10*3/uL (ref 0.1–1.0)
Monocytes Relative: 3 %
Neutro Abs: 6.3 10*3/uL (ref 1.7–7.7)
Neutrophils Relative %: 93 %

## 2021-05-28 LAB — COMPREHENSIVE METABOLIC PANEL
ALT: 14 U/L (ref 0–44)
AST: 29 U/L (ref 15–41)
Albumin: 4.2 g/dL (ref 3.5–5.0)
Alkaline Phosphatase: 51 U/L (ref 38–126)
Anion gap: 10 (ref 5–15)
BUN: 35 mg/dL — ABNORMAL HIGH (ref 8–23)
CO2: 28 mmol/L (ref 22–32)
Calcium: 10.8 mg/dL — ABNORMAL HIGH (ref 8.9–10.3)
Chloride: 103 mmol/L (ref 98–111)
Creatinine, Ser: 2.2 mg/dL — ABNORMAL HIGH (ref 0.61–1.24)
GFR, Estimated: 31 mL/min — ABNORMAL LOW (ref >60)
Glucose, Bld: 119 mg/dL — ABNORMAL HIGH (ref 70–99)
Potassium: 3.5 mmol/L (ref 3.5–5.1)
Sodium: 141 mmol/L (ref 135–145)
Total Bilirubin: 1.1 mg/dL (ref 0.3–1.2)
Total Protein: 7.6 g/dL (ref 6.5–8.1)

## 2021-05-28 LAB — PROTIME-INR
INR: 1.1 (ref 0.8–1.2)
Prothrombin Time: 13.9 seconds (ref 11.4–15.2)

## 2021-05-28 LAB — I-STAT CHEM 8, ED
BUN: 34 mg/dL — ABNORMAL HIGH (ref 8–23)
Calcium, Ion: 1.27 mmol/L (ref 1.15–1.40)
Chloride: 105 mmol/L (ref 98–111)
Creatinine, Ser: 2.2 mg/dL — ABNORMAL HIGH (ref 0.61–1.24)
Glucose, Bld: 112 mg/dL — ABNORMAL HIGH (ref 70–99)
HCT: 33 % — ABNORMAL LOW (ref 39.0–52.0)
Hemoglobin: 11.2 g/dL — ABNORMAL LOW (ref 13.0–17.0)
Potassium: 3.5 mmol/L (ref 3.5–5.1)
Sodium: 142 mmol/L (ref 135–145)
TCO2: 26 mmol/L (ref 22–32)

## 2021-05-28 LAB — CBC
HCT: 31.9 % — ABNORMAL LOW (ref 39.0–52.0)
Hemoglobin: 10.5 g/dL — ABNORMAL LOW (ref 13.0–17.0)
MCH: 31.7 pg (ref 26.0–34.0)
MCHC: 32.9 g/dL (ref 30.0–36.0)
MCV: 96.4 fL (ref 80.0–100.0)
Platelets: 173 10*3/uL (ref 150–400)
RBC: 3.31 MIL/uL — ABNORMAL LOW (ref 4.22–5.81)
RDW: 13.3 % (ref 11.5–15.5)
WBC: 6.8 10*3/uL (ref 4.0–10.5)
nRBC: 0 % (ref 0.0–0.2)

## 2021-05-28 LAB — APTT: aPTT: 27 seconds (ref 24–36)

## 2021-05-28 LAB — CBG MONITORING, ED: Glucose-Capillary: 110 mg/dL — ABNORMAL HIGH (ref 70–99)

## 2021-05-28 LAB — MRSA NEXT GEN BY PCR, NASAL: MRSA by PCR Next Gen: NOT DETECTED

## 2021-05-28 MED ORDER — LABETALOL HCL 5 MG/ML IV SOLN: 10.0000 mg | Freq: Once | INTRAVENOUS | Status: DC

## 2021-05-28 MED ORDER — CHLORHEXIDINE GLUCONATE CLOTH 2 % EX PADS
6.0000 | MEDICATED_PAD | Freq: Every day | CUTANEOUS | Status: DC
  Administered 2021-05-29 – 2021-06-14 (×16): 6 via TOPICAL

## 2021-05-28 MED ORDER — PANTOPRAZOLE SODIUM 40 MG IV SOLR
40.0000 mg | Freq: Every day | INTRAVENOUS | Status: DC
  Administered 2021-05-28 – 2021-05-31 (×4): 40 mg via INTRAVENOUS
  Filled 2021-05-28 (×4): qty 40

## 2021-05-28 MED ORDER — CLEVIDIPINE BUTYRATE 0.5 MG/ML IV EMUL: 0.0000 mg/h | INTRAVENOUS | Status: DC

## 2021-05-28 MED ORDER — LABETALOL HCL 5 MG/ML IV SOLN
INTRAVENOUS | Status: AC
  Filled 2021-05-28: qty 4

## 2021-05-28 MED ORDER — STROKE: EARLY STAGES OF RECOVERY BOOK
Freq: Once | Status: AC
  Filled 2021-05-28: qty 1

## 2021-05-28 MED ORDER — ACETAMINOPHEN 325 MG PO TABS: 650.0000 mg | ORAL_TABLET | ORAL | Status: DC | PRN

## 2021-05-28 MED ORDER — SODIUM CHLORIDE 0.9% FLUSH: 3.0000 mL | Freq: Once | INTRAVENOUS | Status: DC

## 2021-05-28 MED ORDER — ACETAMINOPHEN 160 MG/5ML PO SOLN
650.0000 mg | ORAL | Status: DC | PRN
  Administered 2021-05-30 – 2021-05-31 (×2): 650 mg
  Filled 2021-05-28 (×2): qty 20.3

## 2021-05-28 MED ORDER — CLEVIDIPINE BUTYRATE 0.5 MG/ML IV EMUL
0.0000 mg/h | INTRAVENOUS | Status: DC
  Administered 2021-05-28: 3 mg/h via INTRAVENOUS
  Administered 2021-05-28: 11 mg/h via INTRAVENOUS
  Administered 2021-05-28: 13 mg/h via INTRAVENOUS
  Administered 2021-05-29: 20 mg/h via INTRAVENOUS
  Administered 2021-05-29: 16 mg/h via INTRAVENOUS
  Administered 2021-05-29: 14 mg/h via INTRAVENOUS
  Administered 2021-05-29: 16 mg/h via INTRAVENOUS
  Administered 2021-05-29: 20 mg/h via INTRAVENOUS
  Administered 2021-05-29 (×2): 16 mg/h via INTRAVENOUS
  Administered 2021-05-29: 14 mg/h via INTRAVENOUS
  Administered 2021-05-30: 19 mg/h via INTRAVENOUS
  Administered 2021-05-30 (×2): 20 mg/h via INTRAVENOUS
  Administered 2021-05-30: 16 mg/h via INTRAVENOUS
  Administered 2021-05-30: 19 mg/h via INTRAVENOUS
  Filled 2021-05-28: qty 50
  Filled 2021-05-28 (×3): qty 100
  Filled 2021-05-28: qty 50
  Filled 2021-05-28 (×2): qty 100
  Filled 2021-05-28: qty 300
  Filled 2021-05-28 (×3): qty 100
  Filled 2021-05-28: qty 200
  Filled 2021-05-28: qty 100
  Filled 2021-05-28: qty 50

## 2021-05-28 MED ORDER — ACETAMINOPHEN 650 MG RE SUPP: 650.0000 mg | RECTAL | Status: DC | PRN

## 2021-05-28 MED ORDER — SENNOSIDES-DOCUSATE SODIUM 8.6-50 MG PO TABS
1.0000 | ORAL_TABLET | Freq: Two times a day (BID) | ORAL | Status: DC
  Filled 2021-05-28: qty 1

## 2021-05-28 NOTE — ED Notes (Signed)
Unsuccessful attempt to call report 

## 2021-05-28 NOTE — Consult Note (Signed)
CC:  left sided weakness  HPI:     Patient is a 70 y.o. male with CVA, CAD, CHF on Plavix was brought to the hospital after acute onset of left-sided weakness.  He was found to have a right thalamic hypertensive hemorrhage.    Patient Active Problem List   Diagnosis Date Noted   ICH (intracerebral hemorrhage) ([place]) 05/28/2021   Radiation proctitis 05/19/2021   AKI (acute kidney injury) ([place]) 05/06/2021   Acute cystitis 05/06/2021   Acute encephalopathy 05/05/2021   Ceruminosis, left 02/10/2021   Chronic diastolic CHF (congestive heart failure) ([place]) 02/03/2021   Urinary retention    Mild neurocognitive disorder due to multiple etiologies 12/30/2020   PAC (premature atrial contraction)    Stroke    History of urinary frequency 12/05/2020   History of gout    Dysrhythmia    Decreased appetite    Laryngeal mass 06/17/2020   DOE (dyspnea on exertion) 06/17/2020   Localized osteoarthritis of left knee 06/11/2020   LVH (left ventricular hypertrophy) due to hypertensive disease    Ascending aorta dilation 07/29/2019   Nonsustained ventricular tachycardia 07/23/2019   Elevated hemoglobin A1c 06/11/2019   Corn or callus 06/11/2019   Hypokalemia 12/09/2018   Constipation 12/01/2018   Mass of thyroid region 03/28/2018   Anemia of chronic disease 03/28/2018   Edema 07/09/2016   Major depressive disorder 02/27/2016   CKD (chronic kidney disease) stage 3, GFR 30-59 ml/min 10/13/2014   Hepatitis C virus infection cured after antiviral drug therapy 07/05/2014   Hyperlipidemia 06/29/2014   Prostate cancer 06/09/2014   Essential hypertension 06/09/2014   Onychomycosis 12/01/2013   PVD (peripheral vascular disease) ([place]) 12/01/2013   Past Medical History:  Diagnosis Date   Anemia of chronic disease 03/28/2018   Ascending aorta dilation 07/29/2019   (a) Echo 07/29/19 16mm   Bleeding per rectum 12/09/2018   CKD (chronic kidney disease) stage 3, GFR 30-59 ml/min ([place]) 10/13/2014    Discussed with patient importance of good BP control, avoiding nephrotoxic meds/supplements in preventing further progression   Constipation 12/01/2018   Corn or callus 06/11/2019   Decreased appetite    Diastolic congestive heart failure ([place]) 02/03/2021   DOE (dyspnea on exertion) 06/17/2020   Dysrhythmia    Edema 07/09/2016   Elevated hemoglobin A1c 06/11/2019   Essential hypertension 06/09/2014   Overview:  metoprolol caused bradycardia amlodipine causes headache, leg swelling  Last Assessment & Plan:  We had a long discussion regarding goals of treatment of HTN to prevent worsening cardiac and renal disease.  We also discussed options to change his regimen.  He states that he would prefer to seek a practitioner who can recommend herbal remedies as he feels thhis will be a healthier path f   Hepatitis C virus infection cured after antiviral drug therapy 07/05/2014   History of gout    History of urinary frequency 12/05/2020   Hyperlipidemia 06/29/2014   Hypokalemia 12/09/2018   Laryngeal mass 06/17/2020   Localized osteoarthritis of left knee 06/11/2020   Left knee injection-06/10/2020   LVH (left ventricular hypertrophy) due to hypertensive disease    (a) echo 07/29/19 severe concentric LVH   Major depressive disorder 02/27/2016   Mass of thyroid region 03/28/2018   Mild neurocognitive disorder due to multiple etiologies 12/30/2020   Nonsustained ventricular tachycardia 07/23/2019   (a) Zio monitor 06/2019. Subsequent echo with severe concentric LVH, but normal EF 55-60%   Onychomycosis 12/01/2013   PAC (premature atrial contraction)    (a)  ZIO 06/2019. Asymptomatic   Prostate cancer 1997   Dx in 1997, never treated, taking herbal supplements. S/p prostatectomy 2017   PVD (peripheral vascular disease) 12/01/2013   Stroke    Per 12/26/20 MRI - remote pontine and bilateral thalamic infarcts    Urinary retention     Past Surgical History:  Procedure Laterality Date   ACHILLES TENDON  REPAIR     CERVICAL DISC SURGERY     neck   COLONOSCOPY     before 2010    LYMPHADENECTOMY Bilateral 01/26/2016   Procedure: EXTENDED BILATERAL PELVIC LYMPHADENECTOMY;  Surgeon: Raynelle Bring, MD;  Location: [facility];  Service: Urology;  Laterality: Bilateral;   NOSE SURGERY     in high school-nasal fracturex2   ROBOT ASSISTED LAPAROSCOPIC RADICAL PROSTATECTOMY N/A 01/26/2016   Procedure: XI ROBOTIC ASSISTED LAPAROSCOPIC RADICAL PROSTATECTOMY LEVEL 3;  Surgeon: Raynelle Bring, MD;  Location: [facility];  Service: Urology;  Laterality: N/A;    (Not in a hospital admission)  Allergies  Allergen Reactions   Amlodipine Other (See Comments)    headache   Tamsulosin Hcl     Social History   Tobacco Use   Smoking status: Former    Packs/day: 0.50    Years: 10.00    Pack years: 5.00    Types: Cigarettes    Quit date: 01/11/2006    Years since quitting: 15.3   Smokeless tobacco: Never  Substance Use Topics   Alcohol use: No    Family History  Problem Relation Age of Onset   Heart disease Mother    Obesity Mother    Cervical cancer Mother    Dementia Mother        unspecified type; symptom onset in late 60s/early 20s   Heart disease Father    Diabetes Father    Stroke Father    ALS Sister    Stroke Paternal Grandmother    Stroke Paternal Grandfather    Colon cancer Neg Hx    Esophageal cancer Neg Hx    Colon polyps Neg Hx    Rectal cancer Neg Hx    Stomach cancer Neg Hx      Review of Systems Pertinent items are noted in HPI.  Objective:   Patient Vitals for the past 8 hrs:  BP Resp Height Weight  05/28/21 1930 (!) 141/73 14 -- --  05/28/21 1924 -- -- 5\' 10"  (1.778 m) 73.9 kg  05/28/21 1910 (!) 142/78 16 -- --  05/28/21 1905 (!) 141/70 13 -- --  05/28/21 1855 (!) 126/59 14 -- --  05/28/21 1850 (!) 153/88 13 -- --  05/28/21 1845 (!) 141/73 14 -- --  05/28/21 1840 (!) 141/72 14 -- --  05/28/21 1830 (!) 152/75 14 -- --  05/28/21 1825 (!) 163/79 16 -- --  05/28/21 1820  (!) 169/90 19 -- --  05/28/21 1815 (!) 175/89 13 -- --   No intake/output data recorded. No intake/output data recorded.      General : Alert, cooperative, no distress, appears stated age   Head:  Normocephalic/atraumatic    Eyes: PERRL, conjunctiva/corneas clear, EOM's intact. Fundi could not be visualized Neck: Supple Chest:  Respirations unlabored Chest wall: no tenderness or deformity Heart: Regular rate and rhythm Abdomen: Soft, nontender and nondistended Extremities: warm and well-perfused Skin: normal turgor, color and texture Neurologic: Drowsy, oriented x 3.  Eyes open to voice. PERRL, EOMI, VFC.  Mild left facial droop.  V1-3 intact.  No dysarthria, tongue protrusion symmetric.  CNII-XII  intact. Normal strength, sensation and reflexes on right side.  Left upper extremity 1/5, left lower extremity 2/5.       Data ReviewCBC:  Lab Results  Component Value Date   WBC 6.8 05/28/2021   RBC 3.31 (L) 05/28/2021   BMP:  Lab Results  Component Value Date   GLUCOSE 112 (H) 05/28/2021   CO2 28 05/28/2021   BUN 34 (H) 05/28/2021   BUN 42 (H) 02/03/2021   CREATININE 2.20 (H) 05/28/2021   CREATININE 2.66 (H) 06/17/2020   CALCIUM 10.8 (H) 05/28/2021   Radiology review:   CT head without contrast was reviewed.  This shows a hemorrhage centered in the right pulvinar region of the thalamus with intraventricular extension.  There is mild ventricular dilatation.  Cisterns and sulci are patent. Assessment:   Active Problems:   ICH (intracerebral hemorrhage) (H[facility]) Hypertensive right thalamic hemorrhage with IVH  Plan:   -No indication for neurosurgical intervention at this time.  Would recommend a repeat CT head in the morning.  I also recommend giving platelets given his Plavix use.  I discussed the patient's diagnosis and expected convalescence with him and his wife who was at the bedside.

## 2021-05-28 NOTE — ED Triage Notes (Signed)
Last time know well 1300  code stroke called by gems [phone]  pt arrived to the ed at 67  stroke doctor waiting on the pt  taken to c-t 1730 back to the ed at 1800  [facility] started   in ed  pt alert becoming  more alert

## 2021-05-28 NOTE — ED Notes (Signed)
latebal given in c=t

## 2021-05-28 NOTE — ED Notes (Signed)
Pt has dementia unable to follow instructions

## 2021-05-28 NOTE — ED Provider Notes (Signed)
Emergency Department Provider Note   I have reviewed the triage vital signs and the nursing notes.   HISTORY  Chief Complaint No chief complaint on file.   HPI Casey Greene is a 70 y.o. male with PMH reviewed below presents to the ED with left side weakness. He arrives as a CODE STROKE. Last seen normal at 1 PM.  Patient was up and going to the restroom when he was found leaning over by his wife.  He noted that he felt tired and weak.  She ultimately called EMS.  Level 5 caveat: Stroke symptoms and AMS   Past Medical History:  Diagnosis Date   Anemia of chronic disease 03/28/2018   Ascending aorta dilation 07/29/2019   (a) Echo 07/29/19 81mm   Bleeding per rectum 12/09/2018   CKD (chronic kidney disease) stage 3, GFR 30-59 ml/min ([place]) 10/13/2014   Discussed with patient importance of good BP control, avoiding nephrotoxic meds/supplements in preventing further progression   Constipation 12/01/2018   Corn or callus 06/11/2019   Decreased appetite    Diastolic congestive heart failure ([place]) 02/03/2021   DOE (dyspnea on exertion) 06/17/2020   Dysrhythmia    Edema 07/09/2016   Elevated hemoglobin A1c 06/11/2019   Essential hypertension 06/09/2014   Overview:  metoprolol caused bradycardia amlodipine causes headache, leg swelling  Last Assessment & Plan:  We had a Rohn Fritsch discussion regarding goals of treatment of HTN to prevent worsening cardiac and renal disease.  We also discussed options to change his regimen.  He states that he would prefer to seek a practitioner who can recommend herbal remedies as he feels thhis will be a healthier path f   Hepatitis C virus infection cured after antiviral drug therapy 07/05/2014   History of gout    History of urinary frequency 12/05/2020   Hyperlipidemia 06/29/2014   Hypokalemia 12/09/2018   Laryngeal mass 06/17/2020   Localized osteoarthritis of left knee 06/11/2020   Left knee injection-06/10/2020   LVH (left ventricular hypertrophy)  due to hypertensive disease    (a) echo 07/29/19 severe concentric LVH   Major depressive disorder 02/27/2016   Mass of thyroid region 03/28/2018   Mild neurocognitive disorder due to multiple etiologies 12/30/2020   Nonsustained ventricular tachycardia 07/23/2019   (a) Zio monitor 06/2019. Subsequent echo with severe concentric LVH, but normal EF 55-60%   Onychomycosis 12/01/2013   PAC (premature atrial contraction)    (a) ZIO 06/2019. Asymptomatic   Prostate cancer 1997   Dx in 1997, never treated, taking herbal supplements. S/p prostatectomy 2017   PVD (peripheral vascular disease) 12/01/2013   Stroke    Per 12/26/20 MRI - remote pontine and bilateral thalamic infarcts    Urinary retention     Patient Active Problem List   Diagnosis Date Noted   ICH (intracerebral hemorrhage) ([place]) 05/28/2021   Radiation proctitis 05/19/2021   AKI (acute kidney injury) ([place]) 05/06/2021   Acute cystitis 05/06/2021   Acute encephalopathy 05/05/2021   Ceruminosis, left 02/10/2021   Chronic diastolic CHF (congestive heart failure) ([place]) 02/03/2021   Urinary retention    Mild neurocognitive disorder due to multiple etiologies 12/30/2020   PAC (premature atrial contraction)    Stroke    History of urinary frequency 12/05/2020   History of gout    Dysrhythmia    Decreased appetite    Laryngeal mass 06/17/2020   DOE (dyspnea on exertion) 06/17/2020   Localized osteoarthritis of left knee 06/11/2020   LVH (left ventricular hypertrophy) due to hypertensive  disease    Ascending aorta dilation 07/29/2019   Nonsustained ventricular tachycardia 07/23/2019   Elevated hemoglobin A1c 06/11/2019   Corn or callus 06/11/2019   Hypokalemia 12/09/2018   Constipation 12/01/2018   Mass of thyroid region 03/28/2018   Anemia of chronic disease 03/28/2018   Edema 07/09/2016   Major depressive disorder 02/27/2016   CKD (chronic kidney disease) stage 3, GFR 30-59 ml/min 10/13/2014   Hepatitis C virus infection cured  after antiviral drug therapy 07/05/2014   Hyperlipidemia 06/29/2014   Prostate cancer 06/09/2014   Essential hypertension 06/09/2014   Onychomycosis 12/01/2013   PVD (peripheral vascular disease) ([place]) 12/01/2013    Past Surgical History:  Procedure Laterality Date   ACHILLES TENDON REPAIR     CERVICAL DISC SURGERY     neck   COLONOSCOPY     before 2010    LYMPHADENECTOMY Bilateral 01/26/2016   Procedure: EXTENDED BILATERAL PELVIC LYMPHADENECTOMY;  Surgeon: Raynelle Bring, MD;  Location: [facility];  Service: Urology;  Laterality: Bilateral;   NOSE SURGERY     in high school-nasal fracturex2   ROBOT ASSISTED LAPAROSCOPIC RADICAL PROSTATECTOMY N/A 01/26/2016   Procedure: XI ROBOTIC ASSISTED LAPAROSCOPIC RADICAL PROSTATECTOMY LEVEL 3;  Surgeon: Raynelle Bring, MD;  Location: [facility];  Service: Urology;  Laterality: N/A;    Allergies Amlodipine and Tamsulosin hcl  Family History  Problem Relation Age of Onset   Heart disease Mother    Obesity Mother    Cervical cancer Mother    Dementia Mother        unspecified type; symptom onset in late 60s/early 28s   Heart disease Father    Diabetes Father    Stroke Father    ALS Sister    Stroke Paternal Grandmother    Stroke Paternal Grandfather    Colon cancer Neg Hx    Esophageal cancer Neg Hx    Colon polyps Neg Hx    Rectal cancer Neg Hx    Stomach cancer Neg Hx     Social History Social History   Tobacco Use   Smoking status: Former    Packs/day: 0.50    Years: 10.00    Pack years: 5.00    Types: Cigarettes    Quit date: 01/11/2006    Years since quitting: 15.3   Smokeless tobacco: Never  Vaping Use   Vaping Use: Never used  Substance Use Topics   Alcohol use: No   Drug use: Not Currently    Types: Cocaine, Marijuana    Comment: hx of cocaine abuse; stopped approximately 12 years prior    Review of Systems  Constitutional: No fever/chills. Weakness. Not feeling well.  Cardiovascular: Denies chest pain. Respiratory:  Denies shortness of breath. Gastrointestinal: No abdominal pain. Skin: Negative for rash. Neurological: Negative for headaches.  10-point ROS otherwise negative.  ____________________________________________   PHYSICAL EXAM:  VITAL SIGNS: Temp: 98.4 F RR: 19 SpO2: 100%  Pulse: 88 BP: 125/76   Constitutional: Alert to voice. Slightly drowsy but able to participate with exam and respond verbally.  Eyes: Conjunctivae are normal.  Head: Atraumatic. Nose: No congestion/rhinnorhea. Mouth/Throat: Mucous membranes are moist.  Neck: No stridor.  Cardiovascular: Normal rate, regular rhythm. Good peripheral circulation. Grossly normal heart sounds.   Respiratory: Normal respiratory effort.  No retractions. Lungs CTAB. Gastrointestinal: Soft and nontender. No distention.  Musculoskeletal: No lower extremity tenderness nor edema. No gross deformities of extremities. Neurologic:  Normal speech and language. No facial asymmetry. 4+/5 strength in the LLE and LUE.  Skin:  Skin is warm, dry and intact. No rash noted.   ____________________________________________   LABS (all labs ordered are listed, but only abnormal results are displayed)  Labs Reviewed  CBC - Abnormal; Notable for the following components:      Result Value   RBC 3.31 (*)    Hemoglobin 10.5 (*)    HCT 31.9 (*)    All other components within normal limits  DIFFERENTIAL - Abnormal; Notable for the following components:   Lymphs Abs 0.3 (*)    All other components within normal limits  CBG MONITORING, ED - Abnormal; Notable for the following components:   Glucose-Capillary 110 (*)    All other components within normal limits  I-STAT CHEM 8, ED - Abnormal; Notable for the following components:   BUN 34 (*)    Creatinine, Ser 2.20 (*)    Glucose, Bld 112 (*)    Hemoglobin 11.2 (*)    HCT 33.0 (*)    All other components within normal limits  PROTIME-INR  APTT  HEMOGLOBIN A1C  LIPID PANEL  COMPREHENSIVE  METABOLIC PANEL  CBG MONITORING, ED   ____________________________________________  EKG   EKG Interpretation  Date/Time:    Ventricular Rate:    PR Interval:    QRS Duration:   QT Interval:    QTC Calculation:   R Axis:     Text Interpretation:           ____________________________________________  RADIOLOGY  MR ANGIO HEAD WO CONTRAST  Result Date: 05/29/2021 CLINICAL DATA:  Intracranial hemorrhage, code stroke follow-up EXAM: MRI HEAD WITHOUT AND WITH CONTRAST MRA HEAD WITHOUT CONTRAST MRA NECK WITHOUT CONTRAST TECHNIQUE: Multiplanar, multiecho pulse sequences of the brain and surrounding structures were obtained without and with intravenous contrast. Angiographic images of the Circle of Willis were obtained using MRA technique without intravenous contrast. Angiographic images of the neck were obtained using MRA technique without intravenous contrast. Carotid stenosis measurements (when applicable) are obtained utilizing NASCET criteria, using the distal internal carotid diameter as the denominator. CONTRAST:  89mL GADAVIST GADOBUTROL 1 MMOL/ML IV SOLN COMPARISON:  05/05/2021, 04/30/2021 MRA neck FINDINGS: MRI HEAD Brain: Acute right thalamic hemorrhage seen on recent CT imaging is again identified with surrounding edema. Intraventricular extension is again noted. There is effacement of the third ventricle. Similar ventricle caliber with hydrocephalus. No abnormal enhancement to suggest underlying lesion. Two small foci of reduced diffusion in the inferior right parietal lobe. Stable findings of chronic microvascular ischemic changes. Multiple chronic small vessel infarcts are again identified with involvement of central cerebral white matter, deep gray nuclei, and pons. Numerous foci of susceptibility hypointensity are also again noted in the cerebral white matter, basal ganglia, thalami, and cerebellum consistent with chronic microhemorrhages in a distribution suggesting hypertension  as the etiology. There is no abnormal enhancement. Vascular: Major vessel flow voids at the skull base are preserved. Skull and upper cervical spine: Normal marrow signal is preserved. Sinuses/Orbits: Minor mucosal thickening.  Orbits are unremarkable. Other: Sella is unremarkable.  Mastoid air cells are clear. MRA HEAD Intracranial internal carotid arteries are patent. Middle and anterior cerebral arteries are patent. Intracranial vertebral arteries, basilar artery, posterior cerebral arteries are patent. Bilateral posterior communicating arteries are present. There is no significant stenosis or aneurysm. MRA NECK Common, internal, and external carotid arteries are patent. Codominant vertebral arteries are patent. There is focal moderate narrowing of the right V2 vertebral artery due to osteophytic spurring. No hemodynamically significant stenosis. IMPRESSION: Two small acute infarcts of the inferior  right parietal lobe. Acute right thalamic hemorrhage with intraventricular extension, mild mass effect, and hydrocephalus similar to recent CT imaging. Likely hypertensive in etiology with multiple prior chronic microhemorrhage is noted. Stable vascular imaging. Electronically Signed   By: Macy Mis M.D.   On: 05/29/2021 14:15   MR ANGIO NECK WO CONTRAST  Result Date: 05/29/2021 CLINICAL DATA:  Intracranial hemorrhage, code stroke follow-up EXAM: MRI HEAD WITHOUT AND WITH CONTRAST MRA HEAD WITHOUT CONTRAST MRA NECK WITHOUT CONTRAST TECHNIQUE: Multiplanar, multiecho pulse sequences of the brain and surrounding structures were obtained without and with intravenous contrast. Angiographic images of the Circle of Willis were obtained using MRA technique without intravenous contrast. Angiographic images of the neck were obtained using MRA technique without intravenous contrast. Carotid stenosis measurements (when applicable) are obtained utilizing NASCET criteria, using the distal internal carotid diameter as the  denominator. CONTRAST:  24mL GADAVIST GADOBUTROL 1 MMOL/ML IV SOLN COMPARISON:  05/05/2021, 04/30/2021 MRA neck FINDINGS: MRI HEAD Brain: Acute right thalamic hemorrhage seen on recent CT imaging is again identified with surrounding edema. Intraventricular extension is again noted. There is effacement of the third ventricle. Similar ventricle caliber with hydrocephalus. No abnormal enhancement to suggest underlying lesion. Two small foci of reduced diffusion in the inferior right parietal lobe. Stable findings of chronic microvascular ischemic changes. Multiple chronic small vessel infarcts are again identified with involvement of central cerebral white matter, deep gray nuclei, and pons. Numerous foci of susceptibility hypointensity are also again noted in the cerebral white matter, basal ganglia, thalami, and cerebellum consistent with chronic microhemorrhages in a distribution suggesting hypertension as the etiology. There is no abnormal enhancement. Vascular: Major vessel flow voids at the skull base are preserved. Skull and upper cervical spine: Normal marrow signal is preserved. Sinuses/Orbits: Minor mucosal thickening.  Orbits are unremarkable. Other: Sella is unremarkable.  Mastoid air cells are clear. MRA HEAD Intracranial internal carotid arteries are patent. Middle and anterior cerebral arteries are patent. Intracranial vertebral arteries, basilar artery, posterior cerebral arteries are patent. Bilateral posterior communicating arteries are present. There is no significant stenosis or aneurysm. MRA NECK Common, internal, and external carotid arteries are patent. Codominant vertebral arteries are patent. There is focal moderate narrowing of the right V2 vertebral artery due to osteophytic spurring. No hemodynamically significant stenosis. IMPRESSION: Two small acute infarcts of the inferior right parietal lobe. Acute right thalamic hemorrhage with intraventricular extension, mild mass effect, and  hydrocephalus similar to recent CT imaging. Likely hypertensive in etiology with multiple prior chronic microhemorrhage is noted. Stable vascular imaging. Electronically Signed   By: Macy Mis M.D.   On: 05/29/2021 14:15   MR BRAIN W WO CONTRAST  Result Date: 05/29/2021 CLINICAL DATA:  Intracranial hemorrhage, code stroke follow-up EXAM: MRI HEAD WITHOUT AND WITH CONTRAST MRA HEAD WITHOUT CONTRAST MRA NECK WITHOUT CONTRAST TECHNIQUE: Multiplanar, multiecho pulse sequences of the brain and surrounding structures were obtained without and with intravenous contrast. Angiographic images of the Circle of Willis were obtained using MRA technique without intravenous contrast. Angiographic images of the neck were obtained using MRA technique without intravenous contrast. Carotid stenosis measurements (when applicable) are obtained utilizing NASCET criteria, using the distal internal carotid diameter as the denominator. CONTRAST:  27mL GADAVIST GADOBUTROL 1 MMOL/ML IV SOLN COMPARISON:  05/05/2021, 04/30/2021 MRA neck FINDINGS: MRI HEAD Brain: Acute right thalamic hemorrhage seen on recent CT imaging is again identified with surrounding edema. Intraventricular extension is again noted. There is effacement of the third ventricle. Similar ventricle caliber with hydrocephalus. No  abnormal enhancement to suggest underlying lesion. Two small foci of reduced diffusion in the inferior right parietal lobe. Stable findings of chronic microvascular ischemic changes. Multiple chronic small vessel infarcts are again identified with involvement of central cerebral white matter, deep gray nuclei, and pons. Numerous foci of susceptibility hypointensity are also again noted in the cerebral white matter, basal ganglia, thalami, and cerebellum consistent with chronic microhemorrhages in a distribution suggesting hypertension as the etiology. There is no abnormal enhancement. Vascular: Major vessel flow voids at the skull base are  preserved. Skull and upper cervical spine: Normal marrow signal is preserved. Sinuses/Orbits: Minor mucosal thickening.  Orbits are unremarkable. Other: Sella is unremarkable.  Mastoid air cells are clear. MRA HEAD Intracranial internal carotid arteries are patent. Middle and anterior cerebral arteries are patent. Intracranial vertebral arteries, basilar artery, posterior cerebral arteries are patent. Bilateral posterior communicating arteries are present. There is no significant stenosis or aneurysm. MRA NECK Common, internal, and external carotid arteries are patent. Codominant vertebral arteries are patent. There is focal moderate narrowing of the right V2 vertebral artery due to osteophytic spurring. No hemodynamically significant stenosis. IMPRESSION: Two small acute infarcts of the inferior right parietal lobe. Acute right thalamic hemorrhage with intraventricular extension, mild mass effect, and hydrocephalus similar to recent CT imaging. Likely hypertensive in etiology with multiple prior chronic microhemorrhage is noted. Stable vascular imaging. Electronically Signed   By: Macy Mis M.D.   On: 05/29/2021 14:15    ____________________________________________   PROCEDURES  Procedure(s) performed:   Procedures  CRITICAL CARE Performed by: Margette Fast Total critical care time: 35 minutes Critical care time was exclusive of separately billable procedures and treating other patients. Critical care was necessary to treat or prevent imminent or life-threatening deterioration. Critical care was time spent personally by me on the following activities: development of treatment plan with patient and/or surrogate as well as nursing, discussions with consultants, evaluation of patient's response to treatment, examination of patient, obtaining history from patient or surrogate, ordering and performing treatments and interventions, ordering and review of laboratory studies, ordering and review of  radiographic studies, pulse oximetry and re-evaluation of patient's condition.  Nanda Quinton, MD Emergency Medicine  ____________________________________________   INITIAL IMPRESSION / ASSESSMENT AND PLAN / ED COURSE  Pertinent labs & imaging results that were available during my care of the patient were reviewed by me and considered in my medical decision making (see chart for details).  Patient arrives to the emergency department with strokelike symptoms.  Last normal at 1 PM.  Mild weakness on arrival.  Patient's airway clear for CT scan.  Evaluated in conjunction with neurology on arrival.  05:50 PM  Spoke with Dr. Malen Gauze.  Patient has an interventricular hemorrhage likely causing symptoms found on CT.  Requesting neurosurgery consultation with some hydrocephalus developing. Plan for Neurology admission.   Discussed patient's case with Neurology, Dr. Malen Gauze to request admission. Patient and family (if present) updated with plan. Care transferred to Neurology service.  I reviewed all nursing notes, vitals, pertinent old records, EKGs, labs, imaging (as available).  06:10 PM  Spoke with Dr. Marcello Moores with [facility]. They will follow along but no intervention at this time. Recommends platelets given home med of Plavix and BP control. No further intervention for now.   ____________________________________________  FINAL CLINICAL IMPRESSION(S) / ED DIAGNOSES  Final diagnoses:  Thalamic hemorrhage ([place])     MEDICATIONS GIVEN DURING THIS VISIT:  Medications   stroke: mapping our early stages of recovery book (has  no administration in time range)  acetaminophen (TYLENOL) tablet 650 mg (has no administration in time range)    Or  acetaminophen (TYLENOL) 160 MG/5ML solution 650 mg (has no administration in time range)    Or  acetaminophen (TYLENOL) suppository 650 mg (has no administration in time range)  senna-docusate (Senokot-S) tablet 1 tablet (has no administration in time range)   pantoprazole (PROTONIX) injection 40 mg (has no administration in time range)  labetalol (NORMODYNE) injection 10 mg (has no administration in time range)    And  clevidipine (CLEVIPREX) infusion 0.5 mg/mL (3 mg/hr Intravenous New Bag/Given 05/28/21 1822)  labetalol (NORMODYNE) 5 MG/ML injection (has no administration in time range)  sodium chloride flush (NS) 0.9 % injection 3 mL (has no administration in time range)    Note:  This document was prepared using Dragon voice recognition software and may include unintentional dictation errors.  Nanda Quinton, MD, [facility] Emergency Medicine    Jaclyne Haverstick, [facility], MD 05/30/21 [phone]

## 2021-05-28 NOTE — ED Notes (Signed)
Code strple called 6834 arrival time 1728  lnw 1300  eatiing dinner when he stopped talking no movement in his lt arm and leg   He is waering a holter monitor  Being ruled out  For tias  Attempting to answer questions while in c-t in c-t 1745 back to ed  Neuro np at bedside  Cleaned up of poop and urine   clevipres started as soon as we arrived in ed  See time  Initial  bp 188/92

## 2021-05-28 NOTE — H&P (Addendum)
Neurology H&P  Reason for Consult: Code Stroke- Left-sided weakness: arm, leg, and face Referring Physician: Dr. Laverta Baltimore  CC: Left upper and lower extremity weakness  History is obtained from: EMS, Chart review  HPI: Casey Greene is a 70 y.o. male with a medical history significant for CKD stage III, hyperlipidemia, essential hypertension, LVH, mild neurocognitive disorder on Aricept, peripheral vascular disease, chronic diastolic heart failure, ACD, type 2 diabetes mellitus, and remote strokes identified on MRI in January 2022- remote pontine and bilateral thalamic infarcts and subacute infarcts identified on imaging in June 2022 on aspirin and clopidogrel at home. Also, per chart review patient has been admitted in the past for hypertensive urgency for not taking his blood pressure medications. Per EMS, patient was at home today when he decided to go to the restroom and his wife went to check on him after he had been there for a while. His wife found him leaning over to the left and stated that he did not feel well, he was tired and weak and he did not feel any better she contacted an emergency nurse line. His blood pressure was obtained reading of 237 systolic and activated EMS. EMS arrived noting left arm, leg, and facial weakness. He was last seen normal at 13:00 today. EMS activated a Code Stroke and brought patient to [facility] for further neurology evaluation.   At baseline, Casey Greene walks with a cane and his wife helps him manage his medications due to hospitalizations related to hypertensive urgency when he was not taking his medications correctly. Casey Greene has also recently been diagnosed with mild neurocognitive disorder and placed on Aricept in May of 2022. He is able to perform his ADLs and toilet independently but he is unable to manage his financial issues.   LKW: 13:00 05/28/2021 tpa given?: no, hemorrhage noted on CTH IR Thrombectomy? No, thalamic hemorrhage with IVH seen on initial  CTH ICH Score: 1 for IVH Modified Rankin Scale: 3-Moderate disability-requires help but walks WITHOUT assistance  ROS: A complete ROS was performed and is negative except as noted in the HPI.   Past Medical History:  Diagnosis Date   Anemia of chronic disease 03/28/2018   Ascending aorta dilation 07/29/2019   (a) Echo 07/29/19 53mm   Bleeding per rectum 12/09/2018   CKD (chronic kidney disease) stage 3, GFR 30-59 ml/min ([place]) 10/13/2014   Discussed with patient importance of good BP control, avoiding nephrotoxic meds/supplements in preventing further progression   Constipation 12/01/2018   Corn or callus 06/11/2019   Decreased appetite    Diastolic congestive heart failure ([place]) 02/03/2021   DOE (dyspnea on exertion) 06/17/2020   Dysrhythmia    Edema 07/09/2016   Elevated hemoglobin A1c 06/11/2019   Essential hypertension 06/09/2014   Overview:  metoprolol caused bradycardia amlodipine causes headache, leg swelling  Last Assessment & Plan:  We had a long discussion regarding goals of treatment of HTN to prevent worsening cardiac and renal disease.  We also discussed options to change his regimen.  He states that he would prefer to seek a practitioner who can recommend herbal remedies as he feels thhis will be a healthier path f   Hepatitis C virus infection cured after antiviral drug therapy 07/05/2014   History of gout    History of urinary frequency 12/05/2020   Hyperlipidemia 06/29/2014   Hypokalemia 12/09/2018   Laryngeal mass 06/17/2020   Localized osteoarthritis of left knee 06/11/2020   Left knee injection-06/10/2020   LVH (left ventricular hypertrophy)  due to hypertensive disease    (a) echo 07/29/19 severe concentric LVH   Major depressive disorder 02/27/2016   Mass of thyroid region 03/28/2018   Mild neurocognitive disorder due to multiple etiologies 12/30/2020   Nonsustained ventricular tachycardia 07/23/2019   (a) Zio monitor 06/2019. Subsequent echo with severe concentric  LVH, but normal EF 55-60%   Onychomycosis 12/01/2013   PAC (premature atrial contraction)    (a) ZIO 06/2019. Asymptomatic   Prostate cancer 1997   Dx in 1997, never treated, taking herbal supplements. S/p prostatectomy 2017   PVD (peripheral vascular disease) 12/01/2013   Stroke    Per 12/26/20 MRI - remote pontine and bilateral thalamic infarcts    Urinary retention    Past Surgical History:  Procedure Laterality Date   ACHILLES TENDON REPAIR     CERVICAL DISC SURGERY     neck   COLONOSCOPY     before 2010    LYMPHADENECTOMY Bilateral 01/26/2016   Procedure: EXTENDED BILATERAL PELVIC LYMPHADENECTOMY;  Surgeon: Raynelle Bring, MD;  Location: [facility];  Service: Urology;  Laterality: Bilateral;   NOSE SURGERY     in high school-nasal fracturex2   ROBOT ASSISTED LAPAROSCOPIC RADICAL PROSTATECTOMY N/A 01/26/2016   Procedure: XI ROBOTIC ASSISTED LAPAROSCOPIC RADICAL PROSTATECTOMY LEVEL 3;  Surgeon: Raynelle Bring, MD;  Location: [facility];  Service: Urology;  Laterality: N/A;   Family History  Problem Relation Age of Onset   Heart disease Mother    Obesity Mother    Cervical cancer Mother    Dementia Mother        unspecified type; symptom onset in late 60s/early 41s   Heart disease Father    Diabetes Father    Stroke Father    ALS Sister    Stroke Paternal Grandmother    Stroke Paternal Grandfather    Colon cancer Neg Hx    Esophageal cancer Neg Hx    Colon polyps Neg Hx    Rectal cancer Neg Hx    Stomach cancer Neg Hx    Social History:   reports that he quit smoking about 15 years ago. His smoking use included cigarettes. He has a 5.00 pack-year smoking history. He has never used smokeless tobacco. He reports previous drug use. Drugs: Cocaine and Marijuana. He reports that he does not drink alcohol.  Medications  Current Facility-Administered Medications:     stroke: mapping our early stages of recovery book, , Does not apply, Once, Amie Portland, MD   acetaminophen (TYLENOL)  tablet 650 mg, 650 mg, Oral, Q4H PRN **OR** acetaminophen (TYLENOL) 160 MG/5ML solution 650 mg, 650 mg, Per Tube, Q4H PRN **OR** acetaminophen (TYLENOL) suppository 650 mg, 650 mg, Rectal, Q4H PRN, Amie Portland, MD   labetalol (NORMODYNE) injection 10 mg, 10 mg, Intravenous, Once **AND** clevidipine (CLEVIPREX) infusion 0.5 mg/mL, 0-21 mg/hr, Intravenous, Continuous, Amie Portland, MD, Last Rate: 16 mL/hr at 05/28/21 1839, 8 mg/hr at 05/28/21 1839   labetalol (NORMODYNE) 5 MG/ML injection, , , ,    pantoprazole (PROTONIX) injection 40 mg, 40 mg, Intravenous, QHS, Amie Portland, MD   senna-docusate (Senokot-S) tablet 1 tablet, 1 tablet, Oral, BID, Amie Portland, MD   sodium chloride flush (NS) 0.9 % injection 3 mL, 3 mL, Intravenous, Once, Long, Wonda Olds, MD  Current Outpatient Medications:    aspirin EC 81 MG tablet, Take 81 mg by mouth daily. Swallow whole., Disp: , Rfl:    atorvastatin (LIPITOR) 40 MG tablet, Take 1 tablet (40 mg total) by mouth at bedtime., Disp:  30 tablet, Rfl: 2   carvedilol (COREG) 6.25 MG tablet, Take 1 tablet (6.25 mg total) by mouth 2 (two) times daily with a meal., Disp: 180 tablet, Rfl: 0   clopidogrel (PLAVIX) 75 MG tablet, Take 1 tablet (75 mg total) by mouth daily., Disp: 30 tablet, Rfl: 11   donepezil (ARICEPT) 10 MG tablet, Take 1 tablet (10 mg total) by mouth in the morning., Disp: 30 tablet, Rfl: 1   ferrous sulfate 325 (65 FE) MG tablet, Take 325 mg by mouth daily with breakfast., Disp: , Rfl:    furosemide (LASIX) 40 MG tablet, Take 1 tablet (40 mg total) by mouth every other day., Disp: , Rfl:    hydrALAZINE (APRESOLINE) 100 MG tablet, Take 1 tablet (100 mg total) by mouth 3 (three) times daily., Disp: 90 tablet, Rfl: 3   losartan (COZAAR) 50 MG tablet, Take 1 tablet (50 mg total) by mouth daily., Disp: 10 tablet, Rfl: 0   Omega-3 1000 MG CAPS, Take 1 capsule by mouth daily., Disp: , Rfl:    OVER THE COUNTER MEDICATION, Take 1 capsule by mouth daily. Bladder  Support formula ([place], [place], Cretavox), Disp: , Rfl:    oxybutynin (DITROPAN) 5 MG tablet, TAKE 1 TABLET BY MOUTH AT  BEDTIME, Disp: 90 tablet, Rfl: 3   sucralfate (CARAFATE) 1 GM/10ML suspension, Place 20 mLs (2 g total) rectally 2 (two) times daily. Mix 2 gm (20 ml) with 10 ml of warm water. Instill rectally using an enema bag., Disp: 420 mL, Rfl: 2   vitamin C (ASCORBIC ACID) 500 MG tablet, Take 500 mg by mouth daily., Disp: , Rfl:   Exam: Current vital signs: BP (!) 141/73   Resp 14  Vital signs in last 24 hours: Resp:  [13-19] 14 (07/03 1845) BP: (141-175)/(72-90) 141/73 (07/03 1845) GENERAL: Drowsy, laying in bed, in no acute distress Psych: Calm and cooperative with examination Head: Normocephalic and atraumatic, without obvious abnormality EENT: Arcus senilis bilaterally, no OP obstruction LUNGS: Normal respiratory effort. Non-labored breathing CV: Bradycardia on cardiac monitor, no pedal edema ABDOMEN: Soft, non-tender Ext: warm, well perfused, no obvious deformity  NEURO:  Mental Status: Drowsy, wakes to voice, oriented to self, age, and place. He does not provide specific details regarding history of present illness and answers responses in short phrases.  He incorrectly states the month is May.  Speech/Language: speech is intact without dysarthria.   Naming, repetition, and comprehension intact. No aphasia noted.  Cranial Nerves:  II: PERRL 4 mm/brisk. Visual fields full.  III, IV, VI: EOMI without ptosis. V: Sensation is intact to light touch and symmetrical to face.  VII: Face appears asymmetric resting and normal on smilingsmiling. VIII: Hearing is intact to voice IX, X: Palate elevation is symmetric. Phonation normal.  XI: Normal sternocleidomastoid and trapezius muscle strength XII: Tongue protrudes midline without fasciculations.   Motor: 5/5 strength on the right upper and lower extremity, left upper and lower extremity with 4/5 strength and vertical  drift on assessment.  Tone is normal. Bulk is normal.  Sensation: Intact to light touch and symmetric in bilateral upper extremities. There is evidence of sensory neglect on the left lower extremity.   Coordination: FTN intact on the right upper extremity, left upper extremity weakness limits assessment. HKS intact on the RLE, LLE weakness limits assessment.  DTRs: 2+ tand symmetric biceps, brachioradialis, and patellae Gait: Deferred  NIHSS: 1a Level of Conscious.: 1 1b LOC Questions: 1; Month- May 1c LOC Commands: 0 2 Best Gaze:  0 3 Visual: 0 4 Facial Palsy: 1 5a Motor Arm - left: 1 5b Motor Arm - Right: 0 6a Motor Leg - Left: 1 6b Motor Leg - Right: 0 7 Limb Ataxia: 0 8 Sensory: 0 9 Best Language: 0 10 Dysarthria: 0 11 Extinct. and Inatten.: 1 TOTAL: 6  Labs I have reviewed labs in epic and the results pertinent to this consultation are: CBC    Component Value Date/Time   WBC 6.8 05/28/2021 1752   RBC 3.31 (L) 05/28/2021 1752   HGB 11.2 (L) 05/28/2021 1758   HCT 33.0 (L) 05/28/2021 1758   PLT 173 05/28/2021 1752   MCV 96.4 05/28/2021 1752   MCH 31.7 05/28/2021 1752   MCHC 32.9 05/28/2021 1752   RDW 13.3 05/28/2021 1752   LYMPHSABS 0.3 (L) 05/28/2021 1752   MONOABS 0.2 05/28/2021 1752   EOSABS 0.0 05/28/2021 1752   BASOSABS 0.0 05/28/2021 1752   CMP     Component Value Date/Time   NA 142 05/28/2021 1758   NA 146 (H) 02/03/2021 1100   K 3.5 05/28/2021 1758   CL 105 05/28/2021 1758   CO2 28 05/28/2021 1752   GLUCOSE 112 (H) 05/28/2021 1758   BUN 34 (H) 05/28/2021 1758   BUN 42 (H) 02/03/2021 1100   CREATININE 2.20 (H) 05/28/2021 1758   CREATININE 2.66 (H) 06/17/2020 1535   CALCIUM 10.8 (H) 05/28/2021 1752   PROT 7.6 05/28/2021 1752   ALBUMIN 4.2 05/28/2021 1752   AST 29 05/28/2021 1752   ALT 14 05/28/2021 1752   ALT 41 05/01/2018 1454   ALKPHOS 51 05/28/2021 1752   BILITOT 1.1 05/28/2021 1752   GFRNONAA 31 (L) 05/28/2021 1752   GFRNONAA 24 (L)  05/01/2018 1454   GFRAA 31 04/13/2020 0000   GFRAA 28 (L) 05/01/2018 1454   Lipid Panel     Component Value Date/Time   CHOL 154 05/06/2021 0706   TRIG 33 05/06/2021 0706   HDL 46 05/06/2021 0706   CHOLHDL 3.3 05/06/2021 0706   VLDL 7 05/06/2021 0706   LDLCALC 101 (H) 05/06/2021 0706   Lab Results  Component Value Date   HGBA1C 5.2 05/05/2021   Imaging I have reviewed the images obtained:  CT-scan of the brain 05/28/2021: Acute right thalamic hemorrhage with intraventricular extension and hydrocephalus. Likely hypertensive etiology, noting findings on prior brain MRI.  Assessment: 70 y.o. male with PMHx of CVAs, HTN, HLD, mild neurocognitive disorder, PVD, chronic diastolic heart failure, and DM2 who presented to the ED as a code stroke for evaluation of acute left upper and lower extremity weakness; noticed by family to be dragging his left foot while assisting him to bed.  - Examination reveals drowsy patient with left-sided weakness, mild left sensory neglect, and drowsiness with an initial NIHSS of 5 and ICH score of 1 for IVH. - CT revealed an acute thalamic hemorrhage with IVH extension and hydrocephalus. Etiology likely hypertensive with a systolic home blood pressure reading of 195.  - Patient given labetalol and started on Cleviprex gtt, MRA imaging ordered in place of CTA imaging due to renal function and admitted to the ICU.  Impression: Acute right thalamic hemorrhage - likely hypertensive etiology Left hemiparesis Intraventricular extension Hydrocephalus  Plan: Acute Hemorrhagic Stroke Intraventricular Hemorrhage Hydrocephalus  Acuity: Acute Current Suspected Etiology: Hypertensive bleed Continue Evaluation: ICU -Admit to: -Hold Aspirin and blood thinning medications at this time due to [place] / IVH -Blood pressure control, goal of SYS < 140 - Echocardiogram recently completed  on 05/07/2021 -MRI/A1C/Lipid panel. -Hyperglycemia management per SSI to maintain  glucose 140-180mg /dL. -PT/OT/ST therapies and recommendations when able - CT stability scan in 6 hours -MRA head and neck for vessel imaging due to renal impairment   CNS Cerebral edema Hydrocephalus -Close neuro monitoring -NSGY consult  ICH with IVH -NPO until cleared by speech -ST -Advance diet as tolerated  Hemiparesis following cerebral hemorrhage and IVH affecting -PT/OT -PM&R consult  RESP Maintain SpO2 > 92% -Oxygen as needed  CV Essential (primary) hypertension Hypertensive Emergency -Aggressive BP control, goal SBP < 140 -Titrate oral agents -Cleviprex gtt for blood pressure control  Chronic diastolic heart failure  -TTE completed in June 2022, no need to repeat at this time  Hyperlipidemia, unspecified  - Statin for goal LDL < 70  HEME Anemia in Chronic Diseases -Monitor AM Hgb -Transfuse for hgb < 7  ENDO Type 2 diabetes mellitus w/o complications. -SSI for goal blood glucose 140-180 -Start oral meds -goal HgbA1c < 7%  GI/GU CKD Stage 3 (GFR 30-59) -Gentle hydration -Avoid nephrotoxic agents  Fluid/Electrolyte Disorders Monitor AM CMP -Replete -Repeat labs -Trend  ID Possible aspiration pneumonia -No antibiotics for now -Monitor CBC for leukocytosis -Trend WBC and fever  Nutrition -diet consult  Prophylaxis DVT:  SCDs GI: PPI Bowel: Docusate / Senna  Diet: NPO until cleared by speech  Code Status: Full Code    THE FOLLOWING WERE PRESENT ON ADMISSION: CNS -  IVH, Hydrocephalus, ICH, Hemiparesis,  Respiratory - N/A Cardiovascular - Chronic Diastolic CHF, Hypertensive Emergency,  Infectious -possible aspiration pneumonia GI - History of rectal bleeding 2/2 radiation proctitis  Renal -  CKD stage III Heme-  N/A Cancer - Primary prostate cancer Trauma - N/A  Anibal Henderson, AGAC-NP [facility] Pager: [phone]    Attending addendum Code stroke: Left-sided weakness requested by Dr. Nanda Quinton. Patient seen and examined Sudden onset of left-sided weakness.  NIH stroke scale 5. CT head personally reviewed-right thalamic intracerebral hemorrhage with intraventricular extension and early hydrocephalus. Admit to ICU.  Detailed plan as above which was discussed with me and I helped formulate. Requested neurosurgical consultation.  Dr. Marcello Moores will see the patient.  No acute surgical intervention given he is on dual antiplatelets. For now hold anticoagulants or antiplatelets as above. Repeat CT head in 6 hours Mainstay of treatment-blood pressure management-systolic blood pressure goal less than 140. Requiring Cleviprex. Vessel imaging in the form of MRA head and neck without contrast-due to deranged renal function. MRI brain without contrast in the morning. Neuro ICU aware  -- Amie Portland, MD Neurologist [facility] Pager: [phone]   CRITICAL CARE ATTESTATION Performed by: Amie Portland, MD Total critical care time: 40 minutes Critical care time was exclusive of separately billable procedures and treating other patients and/or supervising APPs/Residents/Students Critical care was necessary to treat or prevent imminent or life-threatening deterioration due to [place], IVH. This patient is critically ill and at significant risk for neurological worsening and/or death and care requires constant monitoring. Critical care was time spent personally by me on the following activities: development of treatment plan with patient and/or surrogate as well as nursing, discussions with consultants, evaluation of patient's response to treatment, examination of patient, obtaining history from patient or surrogate, ordering and performing treatments and interventions, ordering and review of laboratory studies, ordering and review of radiographic studies, pulse oximetry, re-evaluation of patient's condition, participation in multidisciplinary rounds and medical decision making of high  complexity in the care of this patient.

## 2021-05-28 NOTE — ED Notes (Signed)
Report given to rn on 4n 

## 2021-05-29 ENCOUNTER — Encounter: Payer: Self-pay | Admitting: Family Medicine

## 2021-05-29 ENCOUNTER — Inpatient Hospital Stay (HOSPITAL_COMMUNITY): Payer: Medicare Other

## 2021-05-29 DIAGNOSIS — I610 Nontraumatic intracerebral hemorrhage in hemisphere, subcortical: Secondary | ICD-10-CM | POA: Diagnosis not present

## 2021-05-29 LAB — LIPID PANEL
Cholesterol: 114 mg/dL (ref 0–200)
HDL: 54 mg/dL (ref >40)
LDL Cholesterol: 50 mg/dL (ref 0–99)
Total CHOL/HDL Ratio: 2.1 RATIO
Triglycerides: 48 mg/dL (ref <150)
VLDL: 10 mg/dL (ref 0–40)

## 2021-05-29 LAB — HEPATIC FUNCTION PANEL
ALT: 12 U/L (ref 0–44)
AST: 29 U/L (ref 15–41)
Albumin: 3.7 g/dL (ref 3.5–5.0)
Alkaline Phosphatase: 48 U/L (ref 38–126)
Bilirubin, Direct: <0.1 mg/dL (ref 0.0–0.2)
Total Bilirubin: 0.7 mg/dL (ref 0.3–1.2)
Total Protein: 7.2 g/dL (ref 6.5–8.1)

## 2021-05-29 LAB — AMMONIA: Ammonia: 17 umol/L (ref 9–35)

## 2021-05-29 LAB — HEMOGLOBIN A1C
Hgb A1c MFr Bld: 5.1 % (ref 4.8–5.6)
Mean Plasma Glucose: 99.67 mg/dL

## 2021-05-29 MED ORDER — CHLORHEXIDINE GLUCONATE 0.12 % MT SOLN
15.0000 mL | Freq: Two times a day (BID) | OROMUCOSAL | Status: DC
  Administered 2021-05-29 – 2021-06-16 (×38): 15 mL via OROMUCOSAL
  Filled 2021-05-29 (×37): qty 15

## 2021-05-29 MED ORDER — GADOBUTROL 1 MMOL/ML IV SOLN
7.0000 mL | Freq: Once | INTRAVENOUS | Status: AC | PRN
  Administered 2021-05-29: 7 mL via INTRAVENOUS

## 2021-05-29 MED ORDER — MORPHINE SULFATE (PF) 2 MG/ML IV SOLN
2.0000 mg | Freq: Once | INTRAVENOUS | Status: AC
  Administered 2021-05-29: 2 mg via INTRAVENOUS
  Filled 2021-05-29: qty 1

## 2021-05-29 MED ORDER — SODIUM CHLORIDE 0.9 % IV SOLN: INTRAVENOUS | Status: DC

## 2021-05-29 MED ORDER — ORAL CARE MOUTH RINSE
15.0000 mL | Freq: Two times a day (BID) | OROMUCOSAL | Status: DC
  Administered 2021-05-29 – 2021-06-16 (×36): 15 mL via OROMUCOSAL

## 2021-05-29 NOTE — Progress Notes (Signed)
Subjective: Patient reports minimal headache  Objective: Vital signs in last 24 hours: Temp:  [97.4 F (36.3 C)-98.8 F (37.1 C)] 98.4 F (36.9 C) (07/04 0749) Pulse Rate:  [77-101] 84 (07/04 1015) Resp:  [0-29] 13 (07/04 1015) BP: (117-175)/(54-90) 133/71 (07/04 1015) SpO2:  [100 %] 100 % (07/04 1015) Weight:  [73.9 kg] 73.9 kg (07/03 1924)  Intake/Output from previous day: 07/03 0701 - 07/04 0700 In: 360 [I.V.:360] Out: -  Intake/Output this shift: Total I/O In: 95.6 [I.V.:95.6] Out: -   Drowsy, oriented to date, person, place 2/5 strength LUE, 3/5 LLE  Lab Results: Recent Labs    05/28/21 1752 05/28/21 1758  WBC 6.8  --   HGB 10.5* 11.2*  HCT 31.9* 33.0*  PLT 173  --    BMET Recent Labs    05/28/21 1752 05/28/21 1758  NA 141 142  K 3.5 3.5  CL 103 105  CO2 28  --   GLUCOSE 119* 112*  BUN 35* 34*  CREATININE 2.20* 2.20*  CALCIUM 10.8*  --     Studies/Results: CT HEAD WO CONTRAST  Result Date: 05/29/2021 CLINICAL DATA:  Intracranial hemorrhage follow up EXAM: CT HEAD WITHOUT CONTRAST TECHNIQUE: Contiguous axial images were obtained from the base of the skull through the vertex without intravenous contrast. COMPARISON:  05/28/2021 FINDINGS: Brain: Unchanged size of intraparenchymal hematoma centered in the right thalamus. Intraventricular extension of blood is unchanged. Unchanged mild communicating hydrocephalus. No new site of hemorrhage. Vascular: No abnormal hyperdensity of the major intracranial arteries or dural venous sinuses. No intracranial atherosclerosis. Skull: The visualized skull base, calvarium and extracranial soft tissues are normal. Sinuses/Orbits: No fluid levels or advanced mucosal thickening of the visualized paranasal sinuses. No mastoid or middle ear effusion. The orbits are normal. IMPRESSION: 1. Unchanged size of intraparenchymal hematoma centered in the right thalamus with intraventricular extension. 2. Unchanged mild communicating  hydrocephalus. Electronically Signed   By: Ulyses Jarred M.D.   On: 05/29/2021 00:10   CT HEAD CODE STROKE WO CONTRAST  Result Date: 05/28/2021 CLINICAL DATA:  Code stroke.  Left-sided weakness EXAM: CT HEAD WITHOUT CONTRAST TECHNIQUE: Contiguous axial images were obtained from the base of the skull through the vertex without intravenous contrast. COMPARISON:  05/05/2021 FINDINGS: Motion artifact is present. Brain: There is hemorrhage centered within the right thalamus measuring approximately 2.2 x 2.2 x 2.1 cm. Intraventricular extension is present with hemorrhage seen within the right lateral ventricle, third ventricle, cerebral aqueduct, and fourth ventricle. Ventricles are enlarged compared to the recent prior study reflecting hydrocephalus. No definite new loss of gray-white differentiation. Patchy and confluent areas of low-attenuation in the supratentorial white matter nonspecific but probably reflects stable chronic microvascular ischemic changes. No extra-axial collection. Prominence of the ventricles and sulci reflects generalized parenchymal volume loss. Vascular: No hyperdense vessel. Skull: Unremarkable Sinuses/Orbits: No acute abnormality. Other: Mastoid air cells are clear. IMPRESSION: Acute right thalamic hemorrhage with intraventricular extension and hydrocephalus. Likely hypertensive etiology, noting findings on prior brain MRI. These results were communicated to Dr. Rory Percy at 5:49 pm on 05/28/2021 by text page via the [facility] messaging system. Electronically Signed   By: Macy Mis M.D.   On: 05/28/2021 17:52    Assessment/Plan: R thalamic hypertensive hemorrhage - cont supportive care - hold plavix   Vallarie Mare 05/29/2021, 10:53 AM

## 2021-05-29 NOTE — Progress Notes (Addendum)
STROKE TEAM PROGRESS NOTE   INTERVAL HISTORY His RN is at the bedside. He is very somnolent on exam. Stroke work up underway. RN brings to our attention an external heart monitor-> After our visit, wife arrived to say that she already called the company and she will remove it for MRI and she will take care of the device from here (apparently it gets sent back to company).  Blood pressure adequately adequately controlled.  Neurological exam unchanged.  Vital signs stable. Vitals:   05/29/21 0815 05/29/21 0830 05/29/21 0845 05/29/21 0900  BP: 128/76 124/73 128/74 121/81  Pulse: 80 79 77 86  Resp: 11 (!) 0 (!) 0 (!) 9  Temp:      TempSrc:      SpO2: 100% 100% 100% 100%  Weight:      Height:       CBC:  Recent Labs  Lab 05/28/21 1752 05/28/21 1758  WBC 6.8  --   NEUTROABS 6.3  --   HGB 10.5* 11.2*  HCT 31.9* 33.0*  MCV 96.4  --   PLT 173  --    Basic Metabolic Panel:  Recent Labs  Lab 05/28/21 1752 05/28/21 1758  NA 141 142  K 3.5 3.5  CL 103 105  CO2 28  --   GLUCOSE 119* 112*  BUN 35* 34*  CREATININE 2.20* 2.20*  CALCIUM 10.8*  --    Lipid Panel:  Recent Labs  Lab 05/29/21 0224  CHOL 114  TRIG 48  HDL 54  CHOLHDL 2.1  VLDL 10  LDLCALC 50   HgbA1c:  Recent Labs  Lab 05/29/21 0224  HGBA1C 5.1   Urine Drug Screen: No results for input(s): LABOPIA, COCAINSCRNUR, LABBENZ, AMPHETMU, THCU, LABBARB in the last 168 hours.  Alcohol Level No results for input(s): ETH in the last 168 hours.  IMAGING past 24 hours CT HEAD WO CONTRAST  Result Date: 05/29/2021 CLINICAL DATA:  Intracranial hemorrhage follow up EXAM: CT HEAD WITHOUT CONTRAST TECHNIQUE: Contiguous axial images were obtained from the base of the skull through the vertex without intravenous contrast. COMPARISON:  05/28/2021 FINDINGS: Brain: Unchanged size of intraparenchymal hematoma centered in the right thalamus. Intraventricular extension of blood is unchanged. Unchanged mild communicating hydrocephalus.  No new site of hemorrhage. Vascular: No abnormal hyperdensity of the major intracranial arteries or dural venous sinuses. No intracranial atherosclerosis. Skull: The visualized skull base, calvarium and extracranial soft tissues are normal. Sinuses/Orbits: No fluid levels or advanced mucosal thickening of the visualized paranasal sinuses. No mastoid or middle ear effusion. The orbits are normal. IMPRESSION: 1. Unchanged size of intraparenchymal hematoma centered in the right thalamus with intraventricular extension. 2. Unchanged mild communicating hydrocephalus. Electronically Signed   By: Ulyses Jarred M.D.   On: 05/29/2021 00:10   CT HEAD CODE STROKE WO CONTRAST  Result Date: 05/28/2021 CLINICAL DATA:  Code stroke.  Left-sided weakness EXAM: CT HEAD WITHOUT CONTRAST TECHNIQUE: Contiguous axial images were obtained from the base of the skull through the vertex without intravenous contrast. COMPARISON:  05/05/2021 FINDINGS: Motion artifact is present. Brain: There is hemorrhage centered within the right thalamus measuring approximately 2.2 x 2.2 x 2.1 cm. Intraventricular extension is present with hemorrhage seen within the right lateral ventricle, third ventricle, cerebral aqueduct, and fourth ventricle. Ventricles are enlarged compared to the recent prior study reflecting hydrocephalus. No definite new loss of gray-white differentiation. Patchy and confluent areas of low-attenuation in the supratentorial white matter nonspecific but probably reflects stable chronic microvascular ischemic changes. No  extra-axial collection. Prominence of the ventricles and sulci reflects generalized parenchymal volume loss. Vascular: No hyperdense vessel. Skull: Unremarkable Sinuses/Orbits: No acute abnormality. Other: Mastoid air cells are clear. IMPRESSION: Acute right thalamic hemorrhage with intraventricular extension and hydrocephalus. Likely hypertensive etiology, noting findings on prior brain MRI. These results were  communicated to Dr. Rory Percy at 5:49 pm on 05/28/2021 by text page via the [facility] messaging system. Electronically Signed   By: Macy Mis M.D.   On: 05/28/2021 17:52    PHYSICAL EXAM General: Appears well-developed elderly African-American male, mild distress and critically ill. Psych: Affect appropriate to situation Eyes: No scleral injection HENT: No OP obstrucion Head: Normocephalic.  Cardiovascular: Normal rate and regular rhythm.  Respiratory: Effort normal and breath sounds normal to anterior ascultation GI: Soft.  No distension. There is no tenderness.  Skin: WDI    Neurological Examination Mental Status: Somnolent, will alert to voice and orient slowly.  Diminished attention, registration and recall.  Speech is sparse and slow, mild dysarthric, but fluent without evidence of aphasia. Able to follow 3 step commands slowly, with some difficulty. Cranial Nerves: Right gaze pref, but able to cross midline, VF seem full, but more difficult on left for him. Left facial weakness without sensation deficit Motor:Tone and bulk:normal tone throughout; no atrophy noted. Left side has drift with much effort. There is asterixis noted in bilat arms/hands.  Sensory: diminished to light touch on left Plantars: Right: downgoing   Left: downgoing Cerebellar:unable Gait: did not test  ASSESSMENT/PLAN Casey Greene is a 70 y.o. male with history of previous strokes, HTN, HLD, mild neurocognitive disorder, PVD, chronic diastolic heart failure, and DM2 who presented to the ED as a code stroke for evaluation of acute left upper and lower extremity weakness; noticed by family to be dragging his left foot while assisting him to bed. Initial ICH score: 1  ICH:  Right thalamic and IVH secondary to HTN mild cytotoxic edema and obstructive hydrocephalus Code Stroke CT head show ICH/IVH as above MRI  acute right thalamic hemorrhage with surrounding edema, IVH extension, mild effacement of 3rd ventricale,  stable mild hydrocephalus. Noted 2 other small foci of DWI in right parietal lobe. Evidence of numerous old strokes and microhemorrhages that appear HTN as etiology. MRA no significant change or stenosis 2D Echo done 05/07/21: 65-70% EF, mild LVH, severe dilation of LA, mitral regurg and calcific MV disease LDL 50 HgbA1c 5.1 VTE prophylaxis - SCDs only d/t bleed    Diet   Diet NPO time specified   ASA 81mg  + clopidogrel 75 mg daily prior to admission, now on  none d/t bleed   Therapy recommendations:  pending Disposition:  pending  Hypertension Home meds:  Coreg, apresoline, cozaar Stable on Clevaprex gtt, will transition to home meds once route avail Long-term BP goal normotensive  Hyperlipidemia Home meds:  Lipitor 40mg , will resume in hospital once route avail LDL 50, goal < 70 Continue statin at discharge  Diabetes type II - no dx HgbA1c 5.1, goal < 7.0 CBGs Recent Labs    05/28/21 1730  GLUCAP 110*    SSI  Other Stroke Risk Factors Advanced Age >/= 78  Former Cigarette smoker Hx stroke/TIA Family hx stroke  Diastolic Congestive heart failure  Other Active Problems Lethargy/encephalopathy- d/t ICH and hydrocephalus. NSGY following, no EVD at this time CKD3b Chronic anemia: H/H is 10.5/31 Mild neurocognitive disorder- Started Aricept in May H/o prostate ca-s/p proctectomy and with chronic urinary retention PVD Metabolic panel with LFTs  and NH4 added d/t asterixis seen on exam NPO currently- not safe for swallow at this time; may need Cortrak when team avail. Once route is available, will need home meds restarted.   Hospital day # 1  Desiree Metzger-Cihelka, ARNP-C, ANVP-BC Pager: [phone]  I have personally obtained history,examined this patient, reviewed notes, independently viewed imaging studies, participated in medical decision making and plan of care.ROS completed by me personally and pertinent positives fully documented  I have made any additions or  clarifications directly to the above note. Agree with note above.  Continue strict control of hypertension with systolic goal below 086.  Continue close neurological monitoring for worsening hydrocephalus.  Appreciate neurosurgical help.  Check hepatic function labs, ammonia for encephalopathy.  No family available at the bedside.  Discontinue external cardiac monitor for MRI scan.This patient is critically ill and at significant risk of neurological worsening, death and care requires constant monitoring of vital signs, hemodynamics,respiratory and cardiac monitoring, extensive review of multiple databases, frequent neurological assessment, discussion with family, other specialists and medical decision making of high complexity.I have made any additions or clarifications directly to the above note.This critical care time does not reflect procedure time, or teaching time or supervisory time of PA/NP/Med Resident etc but could involve care discussion time.  I spent 30 minutes of neurocritical care time  in the care of  this patient.      Antony Contras, MD Medical Director [place] Pager: [phone] 05/29/2021 3:14 PM  To contact Stroke Continuity provider, please refer to [email or web address] After hours, contact General Neurology

## 2021-05-29 NOTE — Progress Notes (Signed)
OT Cancellation Note  Patient Details Name: Casey Greene MRN: [id number] DOB: [birth year]   Cancelled Treatment:    Reason Eval/Treat Not Completed: Patient not medically ready (11 hours from admission- current guidelines for therapy are >24 hours after admission with hemorrhage unless otherwise notified by MD to evaluate. Please advise on appropriateness for therapy)  Billey Chang, OTR/L  Acute Rehabilitation Services Pager: [phone] Office: [phone]  05/29/2021, 7:47 AM

## 2021-05-29 NOTE — Progress Notes (Signed)
PT Cancellation Note  Patient Details Name: Casey Greene MRN: [id number] DOB: [birth year]   Cancelled Treatment:    Reason Eval/Treat Not Completed: Patient not medically ready (11 hours from admission- current guidelines for therapy are >24 hours after admission with hemorrhage unless otherwise notified by MD to evaluate. Please advise on appropriateness for therapy)  Wyona Almas, PT, DPT Acute Rehabilitation Services Pager [phone] Office [phone]    Deno Etienne 05/29/2021, 7:51 AM

## 2021-05-29 NOTE — Evaluation (Signed)
Clinical/Bedside Swallow Evaluation Patient Details  Name: Casey Greene MRN: [id number] Date of Birth: [birth year]  Today's Date: 05/29/2021 Time: SLP Start Time (ACUTE ONLY): 0907 SLP Stop Time (ACUTE ONLY): 8101 SLP Time Calculation (min) (ACUTE ONLY): 11 min  Past Medical History:  Past Medical History:  Diagnosis Date   Anemia of chronic disease 03/28/2018   Ascending aorta dilation 07/29/2019   (a) Echo 07/29/19 34mm   Bleeding per rectum 12/09/2018   CKD (chronic kidney disease) stage 3, GFR 30-59 ml/min ([place]) 10/13/2014   Discussed with patient importance of good BP control, avoiding nephrotoxic meds/supplements in preventing further progression   Constipation 12/01/2018   Corn or callus 06/11/2019   Decreased appetite    Diastolic congestive heart failure ([place]) 02/03/2021   DOE (dyspnea on exertion) 06/17/2020   Dysrhythmia    Edema 07/09/2016   Elevated hemoglobin A1c 06/11/2019   Essential hypertension 06/09/2014   Overview:  metoprolol caused bradycardia amlodipine causes headache, leg swelling  Last Assessment & Plan:  We had a long discussion regarding goals of treatment of HTN to prevent worsening cardiac and renal disease.  We also discussed options to change his regimen.  He states that he would prefer to seek a practitioner who can recommend herbal remedies as he feels thhis will be a healthier path f   Hepatitis C virus infection cured after antiviral drug therapy 07/05/2014   History of gout    History of urinary frequency 12/05/2020   Hyperlipidemia 06/29/2014   Hypokalemia 12/09/2018   Laryngeal mass 06/17/2020   Localized osteoarthritis of left knee 06/11/2020   Left knee injection-06/10/2020   LVH (left ventricular hypertrophy) due to hypertensive disease    (a) echo 07/29/19 severe concentric LVH   Major depressive disorder 02/27/2016   Mass of thyroid region 03/28/2018   Mild neurocognitive disorder due to multiple etiologies 12/30/2020   Nonsustained ventricular  tachycardia 07/23/2019   (a) Zio monitor 06/2019. Subsequent echo with severe concentric LVH, but normal EF 55-60%   Onychomycosis 12/01/2013   PAC (premature atrial contraction)    (a) ZIO 06/2019. Asymptomatic   Prostate cancer 1997   Dx in 1997, never treated, taking herbal supplements. S/p prostatectomy 2017   PVD (peripheral vascular disease) 12/01/2013   Stroke    Per 12/26/20 MRI - remote pontine and bilateral thalamic infarcts    Urinary retention    Past Surgical History:  Past Surgical History:  Procedure Laterality Date   ACHILLES TENDON REPAIR     CERVICAL DISC SURGERY     neck   COLONOSCOPY     before 2010    LYMPHADENECTOMY Bilateral 01/26/2016   Procedure: EXTENDED BILATERAL PELVIC LYMPHADENECTOMY;  Surgeon: Raynelle Bring, MD;  Location: [facility];  Service: Urology;  Laterality: Bilateral;   NOSE SURGERY     in high school-nasal fracturex2   ROBOT ASSISTED LAPAROSCOPIC RADICAL PROSTATECTOMY N/A 01/26/2016   Procedure: XI ROBOTIC ASSISTED LAPAROSCOPIC RADICAL PROSTATECTOMY LEVEL 3;  Surgeon: Raynelle Bring, MD;  Location: [facility];  Service: Urology;  Laterality: N/A;   HPI:  70 yr old admitted with LUE and LLE weakness. CT revealed acute right thalamic hemorrhage. PMH: CKD, laryngeal mass (05/2020- no ST noted found), DM, mild neurocognitive disorder d/t multiple etiologies, prostate cancer, pontine/bil thalamic infarcts 12/26/20   Assessment / Plan / Recommendation Clinical Impression  Pt awakened and accepted ice chip and spoon and cup trials thin for evaluation. Given lethargic state would continue NPO and initiate po's or participate in instrumental testing with  therapist once sustains alert state. He has mild-mod left sided CN VII and XII impairments with decreased strength and ROM. Vocal quality is weak and volitional cough moderate. Immediate throat clears with thin. ST will continue intervention for po's and initiate SLE when appropriate. SLP Visit Diagnosis: Dysphagia,  unspecified (R13.10)    Aspiration Risk  Moderate aspiration risk    Diet Recommendation NPO   Medication Administration: Via alternative means    Other  Recommendations Oral Care Recommendations: Oral care QID   Follow up Recommendations Inpatient Rehab      Frequency and Duration min 2x/week  2 weeks       Prognosis Prognosis for Safe Diet Advancement: Good Barriers to Reach Goals: Cognitive deficits      Swallow Study   General HPI: 70 yr old admitted with LUE and LLE weakness. CT revealed acute right thalamic hemorrhage. PMH: CKD, laryngeal mass (05/2020- no ST noted found), DM, mild neurocognitive disorder d/t multiple etiologies, prostate cancer, pontine/bil thalamic infarcts 12/26/20 Type of Study: Bedside Swallow Evaluation Previous Swallow Assessment: none found Diet Prior to this Study: NPO Temperature Spikes Noted: No Respiratory Status: Room air History of Recent Intubation: No Behavior/Cognition: Lethargic/Drowsy;Requires cueing Oral Cavity Assessment: Dry Oral Care Completed by SLP: Recent completion by staff Oral Cavity - Dentition: Adequate natural dentition Vision:  (will determine as more alert) Self-Feeding Abilities: Needs assist Patient Positioning: Upright in bed Baseline Vocal Quality: Low vocal intensity Volitional Cough: Other (Comment) (mod strong) Volitional Swallow: Able to elicit    Oral/Motor/Sensory Function Overall Oral Motor/Sensory Function: Mild impairment Facial ROM: Reduced left;Suspected CN VII (facial) dysfunction Facial Symmetry: Abnormal symmetry left;Suspected CN VII (facial) dysfunction Facial Strength: Reduced left;Suspected CN VII (facial) dysfunction Lingual ROM: Reduced left;Suspected CN XII (hypoglossal) dysfunction Lingual Symmetry: Abnormal symmetry right Lingual Strength: Reduced;Suspected CN XII (hypoglossal) dysfunction   Ice Chips Ice chips: Impaired Presentation: Spoon Oral Phase Impairments: Reduced lingual  movement/coordination Oral Phase Functional Implications: Oral holding Pharyngeal Phase Impairments: Other (comments) (none initiated)   Thin Liquid Thin Liquid: Impaired Presentation: Cup;Spoon Oral Phase Impairments: Reduced labial seal Pharyngeal  Phase Impairments: Suspected delayed Swallow;Throat Clearing - Immediate;Multiple swallows    Nectar Thick Nectar Thick Liquid: Not tested   Honey Thick Honey Thick Liquid: Not tested   Puree Puree: Not tested   Solid     Solid: Not tested      Houston Siren 05/29/2021,9:47 AM  Orbie Pyo Colvin Caroli.Ed Risk analyst [phone] Office [phone]

## 2021-05-29 NOTE — Progress Notes (Signed)
Nursing staff preparing pt for transport to MRI. Pt found to have external heart monitor in place and is not MRI safe. Dr. Leonie Man at bedside and aware. He will notify cardiology to determine if monitor can be removed and replaced by nursing staff. MRI is on hold for now.

## 2021-05-30 ENCOUNTER — Inpatient Hospital Stay (HOSPITAL_COMMUNITY): Payer: Medicare Other

## 2021-05-30 ENCOUNTER — Telehealth: Payer: Self-pay | Admitting: Neurology

## 2021-05-30 DIAGNOSIS — I610 Nontraumatic intracerebral hemorrhage in hemisphere, subcortical: Secondary | ICD-10-CM | POA: Diagnosis not present

## 2021-05-30 DIAGNOSIS — E43 Unspecified severe protein-calorie malnutrition: Secondary | ICD-10-CM

## 2021-05-30 LAB — GLUCOSE, CAPILLARY
Glucose-Capillary: 131 mg/dL — ABNORMAL HIGH (ref 70–99)
Glucose-Capillary: 131 mg/dL — ABNORMAL HIGH (ref 70–99)

## 2021-05-30 LAB — PHOSPHORUS
Phosphorus: 2.7 mg/dL (ref 2.5–4.6)
Phosphorus: 2.6 mg/dL (ref 2.5–4.6)

## 2021-05-30 LAB — MAGNESIUM
Magnesium: 1.4 mg/dL — ABNORMAL LOW (ref 1.7–2.4)
Magnesium: 1.5 mg/dL — ABNORMAL LOW (ref 1.7–2.4)

## 2021-05-30 MED ORDER — PROSOURCE TF PO LIQD
45.0000 mL | Freq: Two times a day (BID) | ORAL | Status: DC
  Administered 2021-05-30 – 2021-06-16 (×35): 45 mL
  Filled 2021-05-30 (×36): qty 45

## 2021-05-30 MED ORDER — OSMOLITE 1.5 CAL PO LIQD
1000.0000 mL | ORAL | Status: DC
  Administered 2021-05-30 – 2021-06-16 (×10): 1000 mL
  Filled 2021-05-30 (×10): qty 1000

## 2021-05-30 MED ORDER — LABETALOL HCL 5 MG/ML IV SOLN
20.0000 mg | INTRAVENOUS | Status: DC | PRN
  Administered 2021-05-31 – 2021-06-09 (×9): 20 mg via INTRAVENOUS
  Filled 2021-05-30 (×11): qty 4

## 2021-05-30 MED ORDER — ADULT MULTIVITAMIN W/MINERALS CH
1.0000 | ORAL_TABLET | Freq: Every day | ORAL | Status: DC
  Administered 2021-05-30 – 2021-06-16 (×17): 1
  Filled 2021-05-30 (×19): qty 1

## 2021-05-30 MED ORDER — HYDRALAZINE HCL 20 MG/ML IJ SOLN
20.0000 mg | Freq: Four times a day (QID) | INTRAMUSCULAR | Status: DC | PRN
  Administered 2021-06-03 – 2021-06-09 (×3): 20 mg via INTRAVENOUS
  Filled 2021-05-30 (×3): qty 1

## 2021-05-30 MED ORDER — SENNOSIDES-DOCUSATE SODIUM 8.6-50 MG PO TABS
1.0000 | ORAL_TABLET | Freq: Two times a day (BID) | ORAL | Status: DC
  Administered 2021-05-30 – 2021-06-15 (×23): 1
  Filled 2021-05-30 (×28): qty 1

## 2021-05-30 NOTE — Progress Notes (Signed)
Inpatient Rehab Admissions Coordinator Note:   Per therapy recommendations, pt was screened for CIR candidacy by Clemens Catholic, [place] CCC-SLP. At this time, Pt. Requires total A for bed mobility and has not attempted OOB. I do not believe this Pt. Would be able to tolerate intensity of CIR at this time; however, [facility] team will follow for progress and participation with therapies an place a consult order if Pt. Appears appropriate.   Please contact me with questions.   Clemens Catholic, [place], [place] Admissions Coordinator  [phone] ([place]) [phone] (office)

## 2021-05-30 NOTE — Progress Notes (Addendum)
Initial Nutrition Assessment  DOCUMENTATION CODES:   Severe malnutrition in context of chronic illness  INTERVENTION:   Initiate tube feeding via Cortrak: Osmolite 1.5 at 55 ml/h (1320 ml per day) Prosource TF 45 ml BID  Provides 2060 kcal, 104 gm protein, 1003 ml free water daily  MVI with minerals per tube; daily  Monitor magnesium and phosphorus every 12 hours x 4 occurances, MD to replete as needed, as pt is at risk for refeeding syndrome given severe malnutrition.   NUTRITION DIAGNOSIS:   Severe Malnutrition related to chronic illness (Alzheimers dementia, DM, CKD, CHF) as evidenced by moderate fat depletion, severe fat depletion, moderate muscle depletion, severe muscle depletion, percent weight loss. 23% x 6 months  GOAL:   Patient will meet greater than or equal to 90% of their needs  MONITOR:   Diet advancement, TF tolerance, Labs  REASON FOR ASSESSMENT:   Consult Enteral/tube feeding initiation and management  ASSESSMENT:   Pt with PMH of CKD stage III, HLD, HTN, mild neurocognitive disorder, PVD, CHF, DM, remote strokes now admitted with R thalamic hemorrhage wit intraventricular extension and hydrocephalus with L hemiparesis.    Pt discussed during ICU rounds and with RN.   Spoke with pt's wife. She reports that pt has be losing weight since Jan 2022 - 23% weight loss. Usual weight 210 lb. He typically eats 2-3 meals per day.  Breakfast: oatmeal, eggs, toast, OJ Lunch: left overs, fruit Dinner: home cooked meal usually protein, veggie, and starch Per wife pt has ate little of this last couple of weeks and sometimes skips meals altogether.  He moves around the house a lot but requires a walker/cane to do so. He prepares his own Breakfast and Lunch.    7/5 failed swallow; cortrak placed   Medications reviewed and include: protonix, senokot-s  Cleviprex @ 40 ml/hr provides: 1920 kcal  Labs reviewed    NUTRITION - FOCUSED PHYSICAL EXAM:  Flowsheet  Row Most Recent Value  Orbital Region Moderate depletion  Upper Arm Region Moderate depletion  Thoracic and Lumbar Region Severe depletion  Buccal Region Moderate depletion  Temple Region Moderate depletion  Clavicle Bone Region Severe depletion  Clavicle and Acromion Bone Region Severe depletion  Scapular Bone Region Severe depletion  Dorsal Hand Moderate depletion  Patellar Region Severe depletion  Anterior Thigh Region Severe depletion  Posterior Calf Region Moderate depletion  Edema (RD Assessment) None  Hair Reviewed  Eyes Unable to assess  Mouth Reviewed  [pale]  Skin Reviewed  Nails Reviewed  [pale nail beds]       Diet Order:   Diet Order             Diet NPO time specified  Diet effective now                   EDUCATION NEEDS:   Not appropriate for education at this time  Skin:  Skin Assessment: Reviewed RN Assessment  Last BM:  unknown  Height:   Ht Readings from Last 1 Encounters:  05/28/21 5\' 10"  (1.778 m)    Weight:   Wt Readings from Last 1 Encounters:  05/28/21 73.9 kg    Ideal Body Weight:     BMI:  Body mass index is 23.38 kg/m.  Estimated Nutritional Needs:   Kcal:  1900-2100  Protein:  95-110 grams  Fluid:  > 1.8 L/day  Lockie Pares., RD, LDN, CNSC See AMiON for contact information

## 2021-05-30 NOTE — Telephone Encounter (Signed)
Pt wife called back no answer no voice mail picked up

## 2021-05-30 NOTE — Progress Notes (Signed)
STROKE TEAM PROGRESS NOTE   INTERVAL HISTORY His wife is at the bedside. He is more alert and interactive on exam.  MRI scan reviewed the brain done yesterday shows stable appearance of the intracerebral hematoma but also shows tiny right parietal lacunar infarcts.  MR angiogram of the brain and neck are suboptimal due to motion artifact but no large vessel stenosis is noted. Blood pressure adequately adequately controlled.  But he remains on Cleviprex drip.  Neurological exam unchanged.  Vital signs stable. Vitals:   05/30/21 1500 05/30/21 1515 05/30/21 1530 05/30/21 1545  BP: 133/82 (!) 142/73 (!) 142/91 (!) 142/90  Pulse: 90 80 89 99  Resp:    17  Temp:      TempSrc:      SpO2: 100% 100% 100% 100%  Weight:      Height:       CBC:  Recent Labs  Lab 05/28/21 1752 05/28/21 1758  WBC 6.8  --   NEUTROABS 6.3  --   HGB 10.5* 11.2*  HCT 31.9* 33.0*  MCV 96.4  --   PLT 173  --    Basic Metabolic Panel:  Recent Labs  Lab 05/28/21 1752 05/28/21 1758  NA 141 142  K 3.5 3.5  CL 103 105  CO2 28  --   GLUCOSE 119* 112*  BUN 35* 34*  CREATININE 2.20* 2.20*  CALCIUM 10.8*  --    Lipid Panel:  Recent Labs  Lab 05/29/21 0224  CHOL 114  TRIG 48  HDL 54  CHOLHDL 2.1  VLDL 10  LDLCALC 50   HgbA1c:  Recent Labs  Lab 05/29/21 0224  HGBA1C 5.1   Urine Drug Screen: No results for input(s): LABOPIA, COCAINSCRNUR, LABBENZ, AMPHETMU, THCU, LABBARB in the last 168 hours.  Alcohol Level No results for input(s): ETH in the last 168 hours.  IMAGING past 24 hours DG Abd Portable 1V  Result Date: 05/30/2021 CLINICAL DATA:  Feeding tube placement. EXAM: PORTABLE ABDOMEN - 1 VIEW COMPARISON:  None. FINDINGS: The bowel gas pattern is normal. Distal tip of feeding tube is seen in expected position of distal stomach. No radio-opaque calculi or other significant radiographic abnormality are seen. IMPRESSION: Distal tip of feeding tube seen in expected position of distal stomach.  Electronically Signed   By: Marijo Conception M.D.   On: 05/30/2021 15:32    PHYSICAL EXAM General: Appears well-developed elderly African-American male, not in distress Psych: Affect appropriate to situation Eyes: No scleral injection HENT: No OP obstrucion Head: Normocephalic.  Cardiovascular: Normal rate and regular rhythm.  Respiratory: Effort normal and breath sounds normal to anterior ascultation GI: Soft.  No distension. There is no tenderness.  Skin: WDI    Neurological Examination Mental Status: Awake and alert..  Diminished attention, registration and recall.  Speech is nonfluent and slow, mild dysarthric, but fluent without evidence of aphasia. Able to follow 3 step commands slowly, with some difficulty. Cranial Nerves: Right gaze pref, but able to cross midline, VF seem full, but more difficult on left for him. Left facial weakness without sensation deficit Motor:Tone and bulk:normal tone throughout; no atrophy noted. Left side has drift with much effort. There is asterixis noted in bilat arms/hands.  Sensory: diminished to light touch on left Plantars: Right: downgoing   Left: downgoing Cerebellar:unable Gait: did not test  ASSESSMENT/PLAN Mr. Yonathan Perrow is a 70 y.o. male with history of previous strokes, HTN, HLD, mild neurocognitive disorder, PVD, chronic diastolic heart failure, and DM2 who presented  to the ED as a code stroke for evaluation of acute left upper and lower extremity weakness; noticed by family to be dragging his left foot while assisting him to bed. Initial ICH score: 1  ICH:  Right thalamic and IVH secondary to HTN mild cytotoxic edema and obstructive hydrocephalus Code Stroke CT head show ICH/IVH as above MRI  acute right thalamic hemorrhage with surrounding edema, IVH extension, mild effacement of 3rd ventricale, stable mild hydrocephalus. Noted 2 other small foci of DWI in right parietal lobe. Evidence of numerous old strokes and microhemorrhages  that appear HTN as etiology. MRA no significant change or stenosis 2D Echo done 05/07/21: 65-70% EF, mild LVH, severe dilation of LA, mitral regurg and calcific MV disease LDL 50 HgbA1c 5.1 VTE prophylaxis - SCDs only d/t bleed    Diet   Diet NPO time specified   ASA 81mg  + clopidogrel 75 mg daily prior to admission, now on  none d/t bleed   Therapy recommendations:  pending Disposition:  pending  Hypertension Home meds:  Coreg, apresoline, cozaar Stable on Clevaprex gtt, will transition to home meds once route avail Long-term BP goal normotensive  Hyperlipidemia Home meds:  Lipitor 40mg , will resume in hospital once route avail LDL 50, goal < 70 Continue statin at discharge  Diabetes type II - no dx HgbA1c 5.1, goal < 7.0 CBGs Recent Labs    05/28/21 1730  GLUCAP 110*    SSI  Other Stroke Risk Factors Advanced Age >/= 67  Former Cigarette smoker Hx stroke/TIA Family hx stroke  Diastolic Congestive heart failure  Other Active Problems Lethargy/encephalopathy- d/t ICH and hydrocephalus. NSGY following, no EVD at this time CKD3b Chronic anemia: H/H is 10.5/31 Mild neurocognitive disorder- Started Aricept in May H/o prostate ca-s/p proctectomy and with chronic urinary retention PVD Metabolic panel with LFTs and NH4 added d/t asterixis seen on exam NPO currently- not safe for swallow at this time; may need Cortrak when team avail. Once route is available, will need home meds restarted.   Hospital day # 2     Continue strict control of hypertension with systolic goal below 428.  Continue close neurological monitoring for worsening hydrocephalus.  Appreciate neurosurgical help.   Mobilize out of bed.  Therapy consults.  Patient failed speech therapy hence will place core track tube for nutrition and medications.  Wean Cleviprex drip as tolerated and use as needed IV hydralazine and labetalol and start oral blood pressure medications once feeding tube is in place.  Long  discussion with patient and wife and answered questions..This patient is critically ill and at significant risk of neurological worsening, death and care requires constant monitoring of vital signs, hemodynamics,respiratory and cardiac monitoring, extensive review of multiple databases, frequent neurological assessment, discussion with family, other specialists and medical decision making of high complexity.I have made any additions or clarifications directly to the above note.This critical care time does not reflect procedure time, or teaching time or supervisory time of PA/NP/Med Resident etc but could involve care discussion time.  I spent 32 minutes of neurocritical care time  in the care of  this patient.      Antony Contras, MD Medical Director [facility] Pager: [phone] 05/30/2021 4:25 PM  To contact Stroke Continuity provider, please refer to [email or web address] After hours, contact General Neurology

## 2021-05-30 NOTE — Telephone Encounter (Signed)
Pls let her know I reviewed the records and am sorry to hear about the stroke with bleed. At this point, continue with hospital plan of care. We can cancel his appt on 7/25 for now and reschedule at a later time. Thanks

## 2021-05-30 NOTE — Progress Notes (Signed)
  Speech Language Pathology Treatment: Dysphagia  Patient Details Name: Casey Greene MRN: [id number] DOB: 03/09/1951 Today's Date: 05/30/2021 Time: 7124-5809 SLP Time Calculation (min) (ACUTE ONLY): 23 min  Assessment / Plan / Recommendation Clinical Impression  Pt seen for dysphagia with wife at bedside. Although able to arouse for periods and follow commands,  it is brief. On arrival his vocal quality is wet and gurgly indicative of poor secretion management and his volitional cough is weak (throat clear). Left cranial nerve VII and XII affected. Sips water via cup and straw were dyscoordinated with multiple audible swallows. He did not cough and suspect due to thalamic stroke affecting pharyngeal sensation. He will need instrumental assessment when able to sustain alertness. Presently recommend continue NPO with Cortak, ice chips if becomes more alert this afternoon with nursing.    HPI HPI: 70 yr old admitted with LUE and LLE weakness. CT revealed acute right thalamic hemorrhage. 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), DM, mild neurocognitive disorder d/t multiple etiologies, prostate cancer, pontine/bil thalamic infarcts 12/26/20      SLP Plan  Continue with current plan of care       Recommendations  Diet recommendations: NPO;Other(comment) (ice chip if alert) Medication Administration: Via alternative means                General recommendations: Rehab consult Oral Care Recommendations: Oral care QID Follow up Recommendations: Inpatient Rehab SLP Visit Diagnosis: Dysphagia, unspecified (R13.10) Plan: Continue with current plan of care       GO                Houston Siren 05/30/2021, 10:33 AM  Orbie Pyo Colvin Caroli.Ed Risk analyst [phone] Office [phone]

## 2021-05-30 NOTE — Evaluation (Signed)
Physical Therapy Evaluation Patient Details Name: Casey Greene MRN: [id number] DOB: 17-Oct-1951 Today's Date: 05/30/2021   History of Present Illness  23 male admitted to [facility] on 7/3 with R thalamic hemorrhage with intraventricular extension and hydrocephalus, no plan for neurosurgical intervention. PMH includes CKD stage III, hyperlipidemia, essential hypertension, LVH, mild neurocognitive disorder on Aricept, peripheral vascular disease, chronic diastolic heart failure, ACD, type 2 diabetes mellitus, and remote strokes identified on MRI in January 2022- remote pontine and bilateral thalamic infarcts and subacute infarcts identified on imaging in June 2022 on aspirin and clopidogrel.  Clinical Impression   Pt presents with L inattention, L hemiparesis, intermittent lethargy with flat affect throughout session, poor balance, impaired activity tolerance. Pt to benefit from acute PT to address deficits. Pt tolerating bed mobility and EOB sitting x10 minutes, overall requiring max-total +2 at this time. PT anticipates decreased assist levels when lethargy is less. PT to progress mobility as tolerated, and will continue to follow acutely.      Follow Up Recommendations CIR    Equipment Recommendations  None recommended by PT    Recommendations for Other Services Rehab consult     Precautions / Restrictions Precautions Precautions: Fall Precaution Comments: BP <140 Restrictions Weight Bearing Restrictions: No      Mobility  Bed Mobility Overal bed mobility: Needs Assistance Bed Mobility: Rolling;Supine to Sit;Sit to Supine Rolling: +2 for physical assistance;Total assist   Supine to sit: +2 for physical assistance;Total assist Sit to supine: +2 for physical assistance;Total assist   General bed mobility comments: pt helicopter to EOB with pad and min to max (A) for static sitting balance. pt total (A) total +2 to elevate feet back on to bed surface    Transfers                  General transfer comment: NT  Ambulation/Gait                Stairs            Wheelchair Mobility    Modified Rankin (Stroke Patients Only) Modified Rankin (Stroke Patients Only) Pre-Morbid Rankin Score: Slight disability Modified Rankin: Severe disability     Balance Overall balance assessment: Needs assistance Sitting-balance support: No upper extremity supported;Feet supported Sitting balance-Leahy Scale: Poor Sitting balance - Comments: posterior leaning                                     Pertinent Vitals/Pain Pain Assessment: Faces Faces Pain Scale: No hurt Pain Intervention(s): Limited activity within patient's tolerance    Home Living Family/patient expects to be discharged to:: Private residence Living Arrangements: Spouse/significant other Available Help at Discharge: Family;Available 24 hours/day Type of Home: Apartment Home Access: Level entry     Home Layout: One level Home Equipment: Cane - single point;Bedside commode;Walker - 2 wheels;Grab bars - tub/shower Additional Comments: was driving but now does not drive per wife    Prior Function Level of Independence: Independent with assistive device(s)         Comments: uses cane; wife performs med management, finances, and meal prep     Hand Dominance   Dominant Hand: Right    Extremity/Trunk Assessment   Upper Extremity Assessment Upper Extremity Assessment: RUE deficits/detail;LUE deficits/detail LUE Deficits / Details: trace movement of bicep tricep deltoid and digits flickering LUE Sensation: decreased light touch;decreased proprioception LUE Coordination: decreased fine motor;decreased gross motor  Lower Extremity Assessment Lower Extremity Assessment: Generalized weakness;LLE deficits/detail;Difficult to assess due to impaired cognition LLE Deficits / Details: at least 2+/5 knee extensors LLE Sensation: decreased light touch    Cervical / Trunk  Assessment Cervical / Trunk Assessment: Kyphotic  Communication   Communication: No difficulties  Cognition Arousal/Alertness: Lethargic Behavior During Therapy: Flat affect Overall Cognitive Status: History of cognitive impairments - at baseline                                 General Comments: oriented to location as hospital      General Comments General comments (skin integrity, edema, etc.): SBP 135 and 139 sitting EOB; L occipital raised bump, which wife states is new    Exercises     Assessment/Plan    PT Assessment Patient needs continued PT services  PT Problem List Decreased mobility;Decreased coordination;Decreased activity tolerance;Decreased balance;Decreased strength;Decreased safety awareness;Decreased range of motion;Decreased knowledge of use of DME;Impaired sensation;Impaired tone       PT Treatment Interventions DME instruction;Therapeutic activities;Gait training;Functional mobility training;Therapeutic exercise;Patient/family education;Balance training    PT Goals (Current goals can be found in the Care Plan section)  Acute Rehab PT Goals Patient Stated Goal: wife to get him back home PT Goal Formulation: With patient/family Time For Goal Achievement: 06/13/21 Potential to Achieve Goals: Good    Frequency Min 4X/week   Barriers to discharge        Co-evaluation PT/OT/SLP Co-Evaluation/Treatment: Yes Reason for Co-Treatment: For patient/therapist safety;To address functional/ADL transfers;Necessary to address cognition/behavior during functional activity PT goals addressed during session: Mobility/safety with mobility;Balance;Strengthening/ROM         AM-PAC PT "6 Clicks" Mobility  Outcome Measure Help needed turning from your back to your side while in a flat bed without using bedrails?: A Lot Help needed moving from lying on your back to sitting on the side of a flat bed without using bedrails?: Total Help needed moving to and  from a bed to a chair (including a wheelchair)?: Total Help needed standing up from a chair using your arms (e.g., wheelchair or bedside chair)?: Total Help needed to walk in hospital room?: Total Help needed climbing 3-5 steps with a railing? : Total 6 Click Score: 7    End of Session   Activity Tolerance: Patient tolerated treatment well;Patient limited by fatigue Patient left: in bed;with call bell/phone within reach;with bed alarm set Nurse Communication: Mobility status PT Visit Diagnosis: Other abnormalities of gait and mobility (R26.89)    Time: 1036-1101 PT Time Calculation (min) (ACUTE ONLY): 25 min   Charges:   PT Evaluation $PT Eval Low Complexity: 1 Low        Stacie Glaze, PT DPT Acute Rehabilitation Services Pager [phone]  Office [phone]   Roxine Caddy E Ruffin Pyo 05/30/2021, 12:49 PM

## 2021-05-30 NOTE — Evaluation (Signed)
Occupational Therapy Evaluation Patient Details Name: Casey Greene MRN: [id number] DOB: 08/02/1951 Today's Date: 05/30/2021    History of Present Illness 71 male admitted to [facility] on 7/3 with R thalamic hemorrhage with intraventricular extension and hydrocephalus, no plan for neurosurgical intervention. PMH includes CKD stage III, hyperlipidemia, essential hypertension, LVH, mild neurocognitive disorder on Aricept, peripheral vascular disease, chronic diastolic heart failure, ACD, type 2 diabetes mellitus, and remote strokes identified on MRI in January 2022- remote pontine and bilateral thalamic infarcts and subacute infarcts identified on imaging in June 2022 on aspirin and clopidogrel.   Clinical Impression   PT admitted with CVA. Pt currently with functional limitiations due to the deficits listed below (see OT problem list). Pt currently very lethargic but responses to name call for focused attention. Pt oriented to self and location as hospital. Wife at bedside for entire evaluation. Pt currently requires (A) for all mobility.  Pt will benefit from skilled OT to increase their independence and safety with adls and balance to allow discharge CIR.     Follow Up Recommendations  CIR    Equipment Recommendations  3 in 1 bedside commode    Recommendations for Other Services Rehab consult     Precautions / Restrictions Precautions Precautions: Fall Restrictions Weight Bearing Restrictions: No      Mobility Bed Mobility Overal bed mobility: Needs Assistance Bed Mobility: Rolling;Supine to Sit;Sit to Supine Rolling: +2 for physical assistance;Total assist   Supine to sit: +2 for physical assistance;Total assist Sit to supine: +2 for physical assistance;Total assist   General bed mobility comments: pt helicopter to EOB with pad and min to max (A) for static sitting balance. pt total (A) total +2 to elevate feet back on to bed surface    Transfers                 General  transfer comment: NT    Balance Overall balance assessment: Needs assistance Sitting-balance support: No upper extremity supported;Feet supported Sitting balance-Leahy Scale: Poor                                     ADL either performed or assessed with clinical judgement   ADL Overall ADL's : Needs assistance/impaired                                       General ADL Comments: total (A) at this time     Vision Baseline Vision/History: Wears glasses Wears Glasses: At all times Vision Assessment?: Yes Alignment/Gaze Preference: Gaze right Tracking/Visual Pursuits: Impaired - to be further tested in functional context;Other (comment) (able to track past midline with max cueing)     Perception     Praxis      Pertinent Vitals/Pain Pain Assessment: Faces Faces Pain Scale: No hurt     Hand Dominance Right   Extremity/Trunk Assessment Upper Extremity Assessment Upper Extremity Assessment: RUE deficits/detail;LUE deficits/detail LUE Deficits / Details: trace movement of bicep tricep deltoid and digits flickering LUE Sensation: decreased light touch;decreased proprioception LUE Coordination: decreased fine motor;decreased gross motor   Lower Extremity Assessment Lower Extremity Assessment: LLE deficits/detail LLE Sensation: decreased light touch   Cervical / Trunk Assessment Cervical / Trunk Assessment: Kyphotic   Communication Communication Communication: No difficulties   Cognition Arousal/Alertness: Lethargic Behavior During Therapy: Flat affect Overall Cognitive Status: History  of cognitive impairments - at baseline                                 General Comments: oriented to location as hospital   General Comments  BP supine 135/73 (91) end of session 139/68 (87) wife states bump on L occipital area new to pt skull    Exercises     Shoulder Instructions      Home Living Family/patient expects to be  discharged to:: Private residence Living Arrangements: Spouse/significant other Available Help at Discharge: Family;Available 24 hours/day Type of Home: Apartment Home Access: Level entry     Home Layout: One level     Bathroom Shower/Tub: Teacher, early years/pre: Standard     Home Equipment: Cane - single point;Bedside commode;Walker - 2 wheels;Grab bars - tub/shower   Additional Comments: was driving but now does not drive per wife      Prior Functioning/Environment Level of Independence: Independent with assistive device(s)        Comments: cane        OT Problem List: Decreased strength;Decreased activity tolerance;Impaired balance (sitting and/or standing);Decreased coordination;Impaired vision/perception;Decreased range of motion;Decreased cognition;Decreased safety awareness;Decreased knowledge of use of DME or AE;Decreased knowledge of precautions;Cardiopulmonary status limiting activity;Impaired sensation;Impaired UE functional use      OT Treatment/Interventions: Self-care/ADL training;Therapeutic exercise;Neuromuscular education;Energy conservation;DME and/or AE instruction;Manual therapy;Modalities;Therapeutic activities;Cognitive remediation/compensation;Visual/perceptual remediation/compensation;Patient/family education;Balance training    OT Goals(Current goals can be found in the care plan section) Acute Rehab OT Goals Patient Stated Goal: wife to get him back home OT Goal Formulation: With family Time For Goal Achievement: 06/13/21 Potential to Achieve Goals: Good  OT Frequency: Min 2X/week   Barriers to D/C:            Co-evaluation              AM-PAC OT "6 Clicks" Daily Activity     Outcome Measure Help from another person eating meals?: Total Help from another person taking care of personal grooming?: Total Help from another person toileting, which includes using toliet, bedpan, or urinal?: Total Help from another person  bathing (including washing, rinsing, drying)?: Total Help from another person to put on and taking off regular upper body clothing?: Total Help from another person to put on and taking off regular lower body clothing?: Total 6 Click Score: 6   End of Session Nurse Communication: Mobility status;Precautions  Activity Tolerance: Patient tolerated treatment well Patient left: in bed;with call bell/phone within reach;with bed alarm set;with family/visitor present;with SCD's reapplied  OT Visit Diagnosis: Unsteadiness on feet (R26.81);Muscle weakness (generalized) (M62.81)                Time: 1036-1100 OT Time Calculation (min): 24 min Charges:  OT General Charges $OT Visit: 1 Visit OT Evaluation $OT Eval Moderate Complexity: 1 Mod   Brynn, OTR/L  Acute Rehabilitation Services Pager: [phone] Office: [phone] .   Jeri Modena 05/30/2021, 11:08 AM

## 2021-05-30 NOTE — Progress Notes (Signed)
Subjective: Patient reports no headache  Objective: Vital signs in last 24 hours: Temp:  [99.1 F (37.3 C)-99.6 F (37.6 C)] 99.6 F (37.6 C) (07/05 0800) Pulse Rate:  [77-132] 92 (07/05 1015) Resp:  [9-34] 23 (07/05 1015) BP: (109-152)/(49-96) 131/82 (07/05 1015) SpO2:  [62 %-100 %] 100 % (07/05 1015)  Intake/Output from previous day: 07/04 0701 - 07/05 0700 In: 2228.1 [I.V.:2228.1] Out: 1325 [Urine:1325] Intake/Output this shift: Total I/O In: 338.3 [I.V.:338.3] Out: 300 [Urine:300]  Eyes open spont Oriented to name Speech dysartrhric, FC on R side  Lab Results: Recent Labs    05/28/21 1752 05/28/21 1758  WBC 6.8  --   HGB 10.5* 11.2*  HCT 31.9* 33.0*  PLT 173  --    BMET Recent Labs    05/28/21 1752 05/28/21 1758  NA 141 142  K 3.5 3.5  CL 103 105  CO2 28  --   GLUCOSE 119* 112*  BUN 35* 34*  CREATININE 2.20* 2.20*  CALCIUM 10.8*  --     Studies/Results: CT HEAD WO CONTRAST  Result Date: 05/29/2021 CLINICAL DATA:  Intracranial hemorrhage follow up EXAM: CT HEAD WITHOUT CONTRAST TECHNIQUE: Contiguous axial images were obtained from the base of the skull through the vertex without intravenous contrast. COMPARISON:  05/28/2021 FINDINGS: Brain: Unchanged size of intraparenchymal hematoma centered in the right thalamus. Intraventricular extension of blood is unchanged. Unchanged mild communicating hydrocephalus. No new site of hemorrhage. Vascular: No abnormal hyperdensity of the major intracranial arteries or dural venous sinuses. No intracranial atherosclerosis. Skull: The visualized skull base, calvarium and extracranial soft tissues are normal. Sinuses/Orbits: No fluid levels or advanced mucosal thickening of the visualized paranasal sinuses. No mastoid or middle ear effusion. The orbits are normal. IMPRESSION: 1. Unchanged size of intraparenchymal hematoma centered in the right thalamus with intraventricular extension. 2. Unchanged mild communicating  hydrocephalus. Electronically Signed   By: Ulyses Jarred M.D.   On: 05/29/2021 00:10   MR ANGIO HEAD WO CONTRAST  Result Date: 05/29/2021 CLINICAL DATA:  Intracranial hemorrhage, code stroke follow-up EXAM: MRI HEAD WITHOUT AND WITH CONTRAST MRA HEAD WITHOUT CONTRAST MRA NECK WITHOUT CONTRAST TECHNIQUE: Multiplanar, multiecho pulse sequences of the brain and surrounding structures were obtained without and with intravenous contrast. Angiographic images of the Circle of Willis were obtained using MRA technique without intravenous contrast. Angiographic images of the neck were obtained using MRA technique without intravenous contrast. Carotid stenosis measurements (when applicable) are obtained utilizing NASCET criteria, using the distal internal carotid diameter as the denominator. CONTRAST:  51mL GADAVIST GADOBUTROL 1 MMOL/ML IV SOLN COMPARISON:  05/05/2021, 04/30/2021 MRA neck FINDINGS: MRI HEAD Brain: Acute right thalamic hemorrhage seen on recent CT imaging is again identified with surrounding edema. Intraventricular extension is again noted. There is effacement of the third ventricle. Similar ventricle caliber with hydrocephalus. No abnormal enhancement to suggest underlying lesion. Two small foci of reduced diffusion in the inferior right parietal lobe. Stable findings of chronic microvascular ischemic changes. Multiple chronic small vessel infarcts are again identified with involvement of central cerebral white matter, deep gray nuclei, and pons. Numerous foci of susceptibility hypointensity are also again noted in the cerebral white matter, basal ganglia, thalami, and cerebellum consistent with chronic microhemorrhages in a distribution suggesting hypertension as the etiology. There is no abnormal enhancement. Vascular: Major vessel flow voids at the skull base are preserved. Skull and upper cervical spine: Normal marrow signal is preserved. Sinuses/Orbits: Minor mucosal thickening.  Orbits are  unremarkable. Other: Sella is unremarkable.  Mastoid air cells are clear. MRA HEAD Intracranial internal carotid arteries are patent. Middle and anterior cerebral arteries are patent. Intracranial vertebral arteries, basilar artery, posterior cerebral arteries are patent. Bilateral posterior communicating arteries are present. There is no significant stenosis or aneurysm. MRA NECK Common, internal, and external carotid arteries are patent. Codominant vertebral arteries are patent. There is focal moderate narrowing of the right V2 vertebral artery due to osteophytic spurring. No hemodynamically significant stenosis. IMPRESSION: Two small acute infarcts of the inferior right parietal lobe. Acute right thalamic hemorrhage with intraventricular extension, mild mass effect, and hydrocephalus similar to recent CT imaging. Likely hypertensive in etiology with multiple prior chronic microhemorrhage is noted. Stable vascular imaging. Electronically Signed   By: Macy Mis M.D.   On: 05/29/2021 14:15   MR ANGIO NECK WO CONTRAST  Result Date: 05/29/2021 CLINICAL DATA:  Intracranial hemorrhage, code stroke follow-up EXAM: MRI HEAD WITHOUT AND WITH CONTRAST MRA HEAD WITHOUT CONTRAST MRA NECK WITHOUT CONTRAST TECHNIQUE: Multiplanar, multiecho pulse sequences of the brain and surrounding structures were obtained without and with intravenous contrast. Angiographic images of the Circle of Willis were obtained using MRA technique without intravenous contrast. Angiographic images of the neck were obtained using MRA technique without intravenous contrast. Carotid stenosis measurements (when applicable) are obtained utilizing NASCET criteria, using the distal internal carotid diameter as the denominator. CONTRAST:  23mL GADAVIST GADOBUTROL 1 MMOL/ML IV SOLN COMPARISON:  05/05/2021, 04/30/2021 MRA neck FINDINGS: MRI HEAD Brain: Acute right thalamic hemorrhage seen on recent CT imaging is again identified with surrounding edema.  Intraventricular extension is again noted. There is effacement of the third ventricle. Similar ventricle caliber with hydrocephalus. No abnormal enhancement to suggest underlying lesion. Two small foci of reduced diffusion in the inferior right parietal lobe. Stable findings of chronic microvascular ischemic changes. Multiple chronic small vessel infarcts are again identified with involvement of central cerebral white matter, deep gray nuclei, and pons. Numerous foci of susceptibility hypointensity are also again noted in the cerebral white matter, basal ganglia, thalami, and cerebellum consistent with chronic microhemorrhages in a distribution suggesting hypertension as the etiology. There is no abnormal enhancement. Vascular: Major vessel flow voids at the skull base are preserved. Skull and upper cervical spine: Normal marrow signal is preserved. Sinuses/Orbits: Minor mucosal thickening.  Orbits are unremarkable. Other: Sella is unremarkable.  Mastoid air cells are clear. MRA HEAD Intracranial internal carotid arteries are patent. Middle and anterior cerebral arteries are patent. Intracranial vertebral arteries, basilar artery, posterior cerebral arteries are patent. Bilateral posterior communicating arteries are present. There is no significant stenosis or aneurysm. MRA NECK Common, internal, and external carotid arteries are patent. Codominant vertebral arteries are patent. There is focal moderate narrowing of the right V2 vertebral artery due to osteophytic spurring. No hemodynamically significant stenosis. IMPRESSION: Two small acute infarcts of the inferior right parietal lobe. Acute right thalamic hemorrhage with intraventricular extension, mild mass effect, and hydrocephalus similar to recent CT imaging. Likely hypertensive in etiology with multiple prior chronic microhemorrhage is noted. Stable vascular imaging. Electronically Signed   By: Macy Mis M.D.   On: 05/29/2021 14:15   MR BRAIN W WO  CONTRAST  Result Date: 05/29/2021 CLINICAL DATA:  Intracranial hemorrhage, code stroke follow-up EXAM: MRI HEAD WITHOUT AND WITH CONTRAST MRA HEAD WITHOUT CONTRAST MRA NECK WITHOUT CONTRAST TECHNIQUE: Multiplanar, multiecho pulse sequences of the brain and surrounding structures were obtained without and with intravenous contrast. Angiographic images of the Circle of Willis were obtained using MRA technique without intravenous  contrast. Angiographic images of the neck were obtained using MRA technique without intravenous contrast. Carotid stenosis measurements (when applicable) are obtained utilizing NASCET criteria, using the distal internal carotid diameter as the denominator. CONTRAST:  79mL GADAVIST GADOBUTROL 1 MMOL/ML IV SOLN COMPARISON:  05/05/2021, 04/30/2021 MRA neck FINDINGS: MRI HEAD Brain: Acute right thalamic hemorrhage seen on recent CT imaging is again identified with surrounding edema. Intraventricular extension is again noted. There is effacement of the third ventricle. Similar ventricle caliber with hydrocephalus. No abnormal enhancement to suggest underlying lesion. Two small foci of reduced diffusion in the inferior right parietal lobe. Stable findings of chronic microvascular ischemic changes. Multiple chronic small vessel infarcts are again identified with involvement of central cerebral white matter, deep gray nuclei, and pons. Numerous foci of susceptibility hypointensity are also again noted in the cerebral white matter, basal ganglia, thalami, and cerebellum consistent with chronic microhemorrhages in a distribution suggesting hypertension as the etiology. There is no abnormal enhancement. Vascular: Major vessel flow voids at the skull base are preserved. Skull and upper cervical spine: Normal marrow signal is preserved. Sinuses/Orbits: Minor mucosal thickening.  Orbits are unremarkable. Other: Sella is unremarkable.  Mastoid air cells are clear. MRA HEAD Intracranial internal carotid  arteries are patent. Middle and anterior cerebral arteries are patent. Intracranial vertebral arteries, basilar artery, posterior cerebral arteries are patent. Bilateral posterior communicating arteries are present. There is no significant stenosis or aneurysm. MRA NECK Common, internal, and external carotid arteries are patent. Codominant vertebral arteries are patent. There is focal moderate narrowing of the right V2 vertebral artery due to osteophytic spurring. No hemodynamically significant stenosis. IMPRESSION: Two small acute infarcts of the inferior right parietal lobe. Acute right thalamic hemorrhage with intraventricular extension, mild mass effect, and hydrocephalus similar to recent CT imaging. Likely hypertensive in etiology with multiple prior chronic microhemorrhage is noted. Stable vascular imaging. Electronically Signed   By: Macy Mis M.D.   On: 05/29/2021 14:15   CT HEAD CODE STROKE WO CONTRAST  Result Date: 05/28/2021 CLINICAL DATA:  Code stroke.  Left-sided weakness EXAM: CT HEAD WITHOUT CONTRAST TECHNIQUE: Contiguous axial images were obtained from the base of the skull through the vertex without intravenous contrast. COMPARISON:  05/05/2021 FINDINGS: Motion artifact is present. Brain: There is hemorrhage centered within the right thalamus measuring approximately 2.2 x 2.2 x 2.1 cm. Intraventricular extension is present with hemorrhage seen within the right lateral ventricle, third ventricle, cerebral aqueduct, and fourth ventricle. Ventricles are enlarged compared to the recent prior study reflecting hydrocephalus. No definite new loss of gray-white differentiation. Patchy and confluent areas of low-attenuation in the supratentorial white matter nonspecific but probably reflects stable chronic microvascular ischemic changes. No extra-axial collection. Prominence of the ventricles and sulci reflects generalized parenchymal volume loss. Vascular: No hyperdense vessel. Skull: Unremarkable  Sinuses/Orbits: No acute abnormality. Other: Mastoid air cells are clear. IMPRESSION: Acute right thalamic hemorrhage with intraventricular extension and hydrocephalus. Likely hypertensive etiology, noting findings on prior brain MRI. These results were communicated to Dr. Rory Percy at 5:49 pm on 05/28/2021 by text page via the [facility] messaging system. Electronically Signed   By: Macy Mis M.D.   On: 05/28/2021 17:52    Assessment/Plan: Thalamic ICH with IVH - cont supportive care   Vallarie Mare 05/30/2021, 10:53 AM

## 2021-05-30 NOTE — Procedures (Signed)
Cortrak  Person Inserting Tube:  Andras Grunewald E, RD Tube Type:  Cortrak - 43 inches Tube Size:  10 Tube Location:  Left nare Initial Placement:  Stomach Secured by: Bridle Technique Used to Measure Tube Placement:  Marking at nare/corner of mouth Cortrak Secured At:  70 cm Cortrak Tube Team Note:  Consult received to place a Cortrak feeding tube.   X-ray is required, abdominal x-ray has been ordered by the Cortrak team. Please confirm tube placement before using the Cortrak tube.   If the tube becomes dislodged please keep the tube and contact the Cortrak team at [email or web address] (password [email or web address]) for replacement.  If after hours and replacement cannot be delayed, place a NG tube and confirm placement with an abdominal x-ray.    Casey Romberg, MS, RD, LDN (she/her/hers) RD pager number and weekend/on-call pager number located in Amion.   

## 2021-05-31 DIAGNOSIS — I610 Nontraumatic intracerebral hemorrhage in hemisphere, subcortical: Secondary | ICD-10-CM | POA: Diagnosis not present

## 2021-05-31 LAB — GLUCOSE, CAPILLARY
Glucose-Capillary: 112 mg/dL — ABNORMAL HIGH (ref 70–99)
Glucose-Capillary: 117 mg/dL — ABNORMAL HIGH (ref 70–99)
Glucose-Capillary: 120 mg/dL — ABNORMAL HIGH (ref 70–99)
Glucose-Capillary: 127 mg/dL — ABNORMAL HIGH (ref 70–99)
Glucose-Capillary: 149 mg/dL — ABNORMAL HIGH (ref 70–99)

## 2021-05-31 LAB — MAGNESIUM
Magnesium: 1.7 mg/dL (ref 1.7–2.4)
Magnesium: 1.5 mg/dL — ABNORMAL LOW (ref 1.7–2.4)

## 2021-05-31 LAB — PHOSPHORUS
Phosphorus: 2.1 mg/dL — ABNORMAL LOW (ref 2.5–4.6)
Phosphorus: 2.7 mg/dL (ref 2.5–4.6)

## 2021-05-31 MED ORDER — LOSARTAN POTASSIUM 50 MG PO TABS
50.0000 mg | ORAL_TABLET | Freq: Every day | ORAL | Status: DC
  Administered 2021-05-31 – 2021-06-02 (×3): 50 mg via NASOGASTRIC
  Filled 2021-05-31 (×4): qty 1

## 2021-05-31 MED ORDER — HYDRALAZINE HCL 50 MG PO TABS
100.0000 mg | ORAL_TABLET | Freq: Three times a day (TID) | ORAL | Status: DC
  Administered 2021-05-31 – 2021-06-16 (×49): 100 mg
  Filled 2021-05-31 (×52): qty 2

## 2021-05-31 MED ORDER — AMLODIPINE BESYLATE 5 MG PO TABS
5.0000 mg | ORAL_TABLET | Freq: Every day | ORAL | Status: DC
  Administered 2021-05-31: 5 mg via NASOGASTRIC
  Filled 2021-05-31: qty 1

## 2021-05-31 MED ORDER — CARVEDILOL 6.25 MG PO TABS
6.2500 mg | ORAL_TABLET | Freq: Two times a day (BID) | ORAL | Status: DC
  Administered 2021-05-31 – 2021-06-02 (×5): 6.25 mg via NASOGASTRIC
  Filled 2021-05-31 (×4): qty 1
  Filled 2021-05-31: qty 2

## 2021-05-31 NOTE — Progress Notes (Addendum)
STROKE TEAM PROGRESS NOTE   INTERVAL HISTORY Patient in chair. No family at bedside. NAD. Neurological exam unchanged.  Vital signs stable. Paitent is off cleviprex drip and will be transferred to hospitalist service and floor today. Will start home BP medications through NGT today with PRN's available. Vitals:   05/31/21 0630 05/31/21 0700 05/31/21 0730 05/31/21 0800  BP: (!) 176/92 (!) 151/95 (!) 152/96 (!) 151/83  Pulse: 94 69 73 79  Resp: 20 15 14 12   Temp:      TempSrc:      SpO2: 99% 99% 98% 98%  Weight:      Height:       CBC:  Recent Labs  Lab 05/28/21 1752 05/28/21 1758  WBC 6.8  --   NEUTROABS 6.3  --   HGB 10.5* 11.2*  HCT 31.9* 33.0*  MCV 96.4  --   PLT 173  --    Basic Metabolic Panel:  Recent Labs  Lab 05/28/21 1752 05/28/21 1758 05/30/21 1559 05/30/21 1812 05/31/21 0458  NA 141 142  --   --   --   K 3.5 3.5  --   --   --   CL 103 105  --   --   --   CO2 28  --   --   --   --   GLUCOSE 119* 112*  --   --   --   BUN 35* 34*  --   --   --   CREATININE 2.20* 2.20*  --   --   --   CALCIUM 10.8*  --   --   --   --   MG  --   --    < > 1.5* 1.7  PHOS  --   --    < > 2.6 2.1*   < > = values in this interval not displayed.   Lipid Panel:  Recent Labs  Lab 05/29/21 0224  CHOL 114  TRIG 48  HDL 54  CHOLHDL 2.1  VLDL 10  LDLCALC 50   HgbA1c:  Recent Labs  Lab 05/29/21 0224  HGBA1C 5.1    IMAGING past 24 hours DG Abd Portable 1V  Result Date: 05/30/2021 CLINICAL DATA:  Feeding tube placement. EXAM: PORTABLE ABDOMEN - 1 VIEW COMPARISON:  None. FINDINGS: The bowel gas pattern is normal. Distal tip of feeding tube is seen in expected position of distal stomach. No radio-opaque calculi or other significant radiographic abnormality are seen. IMPRESSION: Distal tip of feeding tube seen in expected position of distal stomach. Electronically Signed   By: Marijo Conception M.D.   On: 05/30/2021 15:32    PHYSICAL EXAM General: Appears well-developed  elderly African-American male, not in distress Psych: Affect appropriate to situation Eyes: No scleral injection HENT: No OP obstrucion Head: Normocephalic.  Cardiovascular: Normal rate and regular rhythm.  Respiratory: Effort normal and breath sounds normal to anterior ascultation GI: Soft.  No distension. There is no tenderness.  Skin: WDI    Neurological Examination Mental Status: Awake and alert..  Diminished attention, registration and recall.  Speech is nonfluent and slow, mild dysarthric, but fluent without evidence of aphasia. Able to follow 3 step commands slowly, with some difficulty. Cranial Nerves: Right gaze pref, but able to cross midline, VF seem full, but more difficult on left for him. Left facial weakness without sensation deficit Motor:Tone and bulk:normal tone throughout; no atrophy noted. Left side has drift with much effort. There is asterixis noted in bilat  arms/hands.  Sensory: diminished to light touch on left Plantars: Right: downgoing   Left: downgoing Cerebellar:unable Gait: did not test  ASSESSMENT/PLAN [name] is a 70 y.o. male with history of previous strokes, HTN, HLD, mild neurocognitive disorder, PVD, chronic diastolic heart failure, and DM2 who presented to the ED as a code stroke for evaluation of acute left upper and lower extremity weakness; noticed by family to be dragging his left foot while assisting him to bed. Initial ICH score: 1  ICH:  Right thalamic and IVH secondary to HTN mild cytotoxic edema and obstructive hydrocephalus Code Stroke CT head show ICH/IVH as above MRI  acute right thalamic hemorrhage with surrounding edema, IVH extension, mild effacement of 3rd ventricale, stable mild hydrocephalus. Noted 2 other small foci of DWI in right parietal lobe. Evidence of numerous old strokes and microhemorrhages that appear HTN as etiology. MRA no significant change or stenosis 2D Echo done 05/07/21: 65-70% EF, mild LVH, severe dilation  of LA, mitral regurg and calcific MV disease LDL 50 HgbA1c 5.1 VTE prophylaxis - SCDs only d/t bleed    Diet   Diet NPO time specified   ASA 81mg  + clopidogrel 75 mg daily prior to admission, now on  none d/t bleed   Therapy recommendations:  CIR Disposition:  pending  Hypertension Home meds:  Coreg, apresoline, cozaar Stable on Clevaprex gtt, will transition to home meds once route avail Long-term BP goal normotensive  Hyperlipidemia Home meds:  Lipitor 40mg , will resume in hospital once route avail LDL 50, goal < 70 Continue statin at discharge  Diabetes type II - no dx HgbA1c 5.1, goal < 7.0 CBGs Recent Labs    05/30/21 2326 05/31/21 0339 05/31/21 0748  GLUCAP 131* 117* 120*      Other Stroke Risk Factors Advanced Age >/= 31  Former Cigarette smoker Hx stroke/TIA Family hx stroke  Diastolic Congestive heart failure  Other Active Problems Lethargy/encephalopathy- d/t ICH and hydrocephalus. NSGY following, no EVD at this time CKD3b Chronic anemia: H/H is 10.5/31 Mild neurocognitive disorder- Started Aricept in May H/o prostate ca-s/p proctectomy and with chronic urinary retention PVD Metabolic panel with LFTs and NH4 added d/t asterixis seen on exam NPO currently- not safe for swallow at this time; may need Cortrak when team avail. Once route is available, will need home meds restarted.   Hospital day # [name], MSN, NP-C [facility] [phone]  I have personally obtained history,examined this patient, reviewed notes, independently viewed imaging studies, participated in medical decision making and plan of care.ROS completed by me personally and pertinent positives fully documented  I have made any additions or clarifications directly to the above note. Agree with note above.  Recommend mobilize out of bed.  Continue blood pressure medications through core track tube and use as needed IV keep systolic goal below 767.  Continue  ongoing therapies.  Transfer to neurology floor bed.  Transfer to medical hospitalist service for ongoing care and hopefully transfer to inpatient rehab over the next few days.  Greater than 50% time during this 35-minute visit was spent in counseling and coordination of care and discussion with care team.  Antony Contras, MD Medical Director [place] Pager: [phone] 05/31/2021 2:39 PM   To contact Stroke Continuity provider, please refer to [email or web address] After hours, contact General Neurology

## 2021-05-31 NOTE — Evaluation (Signed)
Speech Language Pathology Evaluation Patient Details Name: Casey Greene MRN: [id number] DOB: 13-Feb-1951 Today's Date: 05/31/2021 Time: 1914-7829 SLP Time Calculation (min) (ACUTE ONLY): 11 min  Problem List:  Patient Active Problem List   Diagnosis Date Noted   Protein-calorie malnutrition, severe 05/30/2021   ICH (intracerebral hemorrhage) ([place]) 05/28/2021   Radiation proctitis 05/19/2021   AKI (acute kidney injury) ([place]) 05/06/2021   Acute cystitis 05/06/2021   Acute encephalopathy 05/05/2021   Ceruminosis, left 02/10/2021   Chronic diastolic CHF (congestive heart failure) ([place]) 02/03/2021   Urinary retention    Mild neurocognitive disorder due to multiple etiologies 12/30/2020   PAC (premature atrial contraction)    Stroke    History of urinary frequency 12/05/2020   History of gout    Dysrhythmia    Decreased appetite    Laryngeal mass 06/17/2020   DOE (dyspnea on exertion) 06/17/2020   Localized osteoarthritis of left knee 06/11/2020   LVH (left ventricular hypertrophy) due to hypertensive disease    Ascending aorta dilation 07/29/2019   Nonsustained ventricular tachycardia 07/23/2019   Elevated hemoglobin A1c 06/11/2019   Corn or callus 06/11/2019   Hypokalemia 12/09/2018   Constipation 12/01/2018   Mass of thyroid region 03/28/2018   Anemia of chronic disease 03/28/2018   Edema 07/09/2016   Major depressive disorder 02/27/2016   CKD (chronic kidney disease) stage 3, GFR 30-59 ml/min 10/13/2014   Hepatitis C virus infection cured after antiviral drug therapy 07/05/2014   Hyperlipidemia 06/29/2014   Prostate cancer 06/09/2014   Essential hypertension 06/09/2014   Onychomycosis 12/01/2013   PVD (peripheral vascular disease) ([place]) 12/01/2013   Past Medical History:  Past Medical History:  Diagnosis Date   Anemia of chronic disease 03/28/2018   Ascending aorta dilation 07/29/2019   (a) Echo 07/29/19 80mm   Bleeding per rectum 12/09/2018   CKD (chronic kidney  disease) stage 3, GFR 30-59 ml/min ([place]) 10/13/2014   Discussed with patient importance of good BP control, avoiding nephrotoxic meds/supplements in preventing further progression   Constipation 12/01/2018   Corn or callus 06/11/2019   Decreased appetite    Diastolic congestive heart failure ([place]) 02/03/2021   DOE (dyspnea on exertion) 06/17/2020   Dysrhythmia    Edema 07/09/2016   Elevated hemoglobin A1c 06/11/2019   Essential hypertension 06/09/2014   Overview:  metoprolol caused bradycardia amlodipine causes headache, leg swelling  Last Assessment & Plan:  We had a long discussion regarding goals of treatment of HTN to prevent worsening cardiac and renal disease.  We also discussed options to change his regimen.  He states that he would prefer to seek a practitioner who can recommend herbal remedies as he feels thhis will be a healthier path f   Hepatitis C virus infection cured after antiviral drug therapy 07/05/2014   History of gout    History of urinary frequency 12/05/2020   Hyperlipidemia 06/29/2014   Hypokalemia 12/09/2018   Laryngeal mass 06/17/2020   Localized osteoarthritis of left knee 06/11/2020   Left knee injection-06/10/2020   LVH (left ventricular hypertrophy) due to hypertensive disease    (a) echo 07/29/19 severe concentric LVH   Major depressive disorder 02/27/2016   Mass of thyroid region 03/28/2018   Mild neurocognitive disorder due to multiple etiologies 12/30/2020   Nonsustained ventricular tachycardia 07/23/2019   (a) Zio monitor 06/2019. Subsequent echo with severe concentric LVH, but normal EF 55-60%   Onychomycosis 12/01/2013   PAC (premature atrial contraction)    (a) ZIO 06/2019. Asymptomatic   Prostate cancer 1997  Dx in 1997, never treated, taking herbal supplements. S/p prostatectomy 2017   PVD (peripheral vascular disease) 12/01/2013   Stroke    Per 12/26/20 MRI - remote pontine and bilateral thalamic infarcts    Urinary retention    Past Surgical  History:  Past Surgical History:  Procedure Laterality Date   ACHILLES TENDON REPAIR     CERVICAL DISC SURGERY     neck   COLONOSCOPY     before 2010    LYMPHADENECTOMY Bilateral 01/26/2016   Procedure: EXTENDED BILATERAL PELVIC LYMPHADENECTOMY;  Surgeon: Raynelle Bring, MD;  Location: [facility];  Service: Urology;  Laterality: Bilateral;   NOSE SURGERY     in high school-nasal fracturex2   ROBOT ASSISTED LAPAROSCOPIC RADICAL PROSTATECTOMY N/A 01/26/2016   Procedure: XI ROBOTIC ASSISTED LAPAROSCOPIC RADICAL PROSTATECTOMY LEVEL 3;  Surgeon: Raynelle Bring, MD;  Location: [facility];  Service: Urology;  Laterality: N/A;   HPI:  70 yr old admitted with LUE and LLE weakness. CT revealed acute right thalamic hemorrhage. 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), DM, mild neurocognitive disorder d/t multiple etiologies, prostate cancer, pontine/bil thalamic infarcts 12/26/20   Assessment / Plan / Recommendation Clinical Impression  Pt demonstrates deficits in level of consciousness, speech intelligibility and cognition. Baseline memory impairments and wife manages his meds and finances. Assessment limited by sleepy state. He is oriented to person, place and situation. Wife reports at baseline he is oriented to time. Vocal intensity is low, wet and frequency of reflexive swallows are reduced and he had difficulty clearing. As he is more alert, cognition will continue to be assessed and goals modified.    SLP Assessment  SLP Recommendation/Assessment: Patient needs continued Speech Lanaguage Pathology Services SLP Visit Diagnosis: Dysarthria and anarthria (R47.1);Cognitive communication deficit (R41.841)    Follow Up Recommendations  Inpatient Rehab    Frequency and Duration min 2x/week  2 weeks      SLP Evaluation Cognition  Overall Cognitive Status: Impaired/Different from baseline Arousal/Alertness: Lethargic Orientation Level: Oriented to person;Disoriented to place;Disoriented to  time;Disoriented to situation Attention: Sustained Sustained Attention: Impaired Sustained Attention Impairment: Verbal basic Memory:  (will assess further as more alert) Awareness: Impaired Awareness Impairment: Intellectual impairment;Emergent impairment;Anticipatory impairment Problem Solving: Impaired Problem Solving Impairment: Verbal basic;Functional basic Safety/Judgment: Impaired       Comprehension  Auditory Comprehension Overall Auditory Comprehension: Appears within functional limits for tasks assessed Interfering Components: Attention;Processing speed Visual Recognition/Discrimination Discrimination: Not tested Reading Comprehension Reading Status: Not tested    Expression Expression Primary Mode of Expression: Verbal Verbal Expression Overall Verbal Expression: Appears within functional limits for tasks assessed Written Expression Dominant Hand: Right Written Expression: Not tested   Oral / Motor  Oral Motor/Sensory Function Overall Oral Motor/Sensory Function: Mild impairment Facial ROM: Reduced left;Suspected CN VII (facial) dysfunction Facial Symmetry: Abnormal symmetry left;Suspected CN VII (facial) dysfunction Facial Strength: Reduced left;Suspected CN VII (facial) dysfunction Lingual ROM: Reduced left;Suspected CN XII (hypoglossal) dysfunction Lingual Symmetry: Abnormal symmetry right Lingual Strength: Reduced;Suspected CN XII (hypoglossal) dysfunction Motor Speech Overall Motor Speech: Impaired Respiration: Impaired Level of Impairment: Sentence Phonation: Wet;Low vocal intensity Articulation: Impaired Level of Impairment: Phrase Intelligibility: Intelligibility reduced Word: 75-100% accurate Phrase: 50-74% accurate Motor Planning: Witnin functional limits   GO                    Houston Siren 05/31/2021, 4:55 PM  Orbie Pyo Colvin Caroli.Ed Risk analyst [phone] Office [phone]

## 2021-05-31 NOTE — Progress Notes (Signed)
  Speech Language Pathology Treatment: Dysphagia  Patient Details Name: Casey Greene MRN: [id number] DOB: 16-Sep-1951 Today's Date: 05/31/2021 Time: 3790-2409 SLP Time Calculation (min) (ACUTE ONLY): 11 min  Assessment / Plan / Recommendation Clinical Impression  Dysphagia treatment with wife at bedside limited given his lethargy. Pt's vocal quality continues to be wet with weak throat clear and found to have large bolus of phlegm dispersed about oral cavity removed with oral care. No po's trialed due to poor level of alertness this session. Updated and educated wife to goals.    HPI HPI: 70 yr old admitted with LUE and LLE weakness. CT revealed acute right thalamic hemorrhage. 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), DM, mild neurocognitive disorder d/t multiple etiologies, prostate cancer, pontine/bil thalamic infarcts 12/26/20      SLP Plan  Continue with current plan of care  Patient needs continued Speech Lanaguage Pathology Services    Recommendations  Diet recommendations: NPO Medication Administration: Via alternative means                General recommendations: Rehab consult Oral Care Recommendations: Oral care QID Follow up Recommendations: Inpatient Rehab SLP Visit Diagnosis: Dysphagia, unspecified (R13.10) Plan: Continue with current plan of care                      Houston Siren 05/31/2021, 4:59 PM  Orbie Pyo Colvin Caroli.Ed Risk analyst [phone] Office [phone]

## 2021-05-31 NOTE — Progress Notes (Signed)
I called 3W at this time and gave report on patient to Nate, RN. Bed is not ready to be picked up yet. We will transfer patient when bed is available. If next shift, I will relay this information to the nurse.  Montez Hageman RN

## 2021-05-31 NOTE — Progress Notes (Signed)
Physical Therapy Treatment Patient Details Name: Casey Greene MRN: [id number] DOB: [birth year] Today's Date: 05/31/2021    History of Present Illness 50 male admitted to [facility] on 7/3 with R thalamic hemorrhage with intraventricular extension and hydrocephalus, no plan for neurosurgical intervention. PMH includes CKD stage III, hyperlipidemia, essential hypertension, LVH, mild neurocognitive disorder on Aricept, peripheral vascular disease, chronic diastolic heart failure, ACD, type 2 diabetes mellitus, and remote strokes identified on MRI in January 2022- remote pontine and bilateral thalamic infarcts and subacute infarcts identified on imaging in June 2022 on aspirin and clopidogrel.    PT Comments    Pt making good progress with PT, able to stand and take x1 step laterally this date with mod-maxAx2. Pt still limited by lethargy, but able to be aroused with minimal stimulation. Pt following commands well with increased time to assist with all functional mobility. Will continue to follow acutely. Current recommendations remain appropriate.    Follow Up Recommendations  CIR     Equipment Recommendations  None recommended by PT    Recommendations for Other Services Rehab consult     Precautions / Restrictions Precautions Precautions: Fall Precaution Comments: BP <140 Restrictions Weight Bearing Restrictions: No    Mobility  Bed Mobility Overal bed mobility: Needs Assistance Bed Mobility: Supine to Sit     Supine to sit: +2 for physical assistance;Mod assist     General bed mobility comments: Extra time and verbal and tactile cues provided to manage legs to EOB, good initiation noted bil but needs some minor assistance at L leg to complete. ModAx2 to manage trunk to sit up, cuing pt to grasp bed rail with R and push up with L, poor initiation noted at L UE.    Transfers Overall transfer level: Needs assistance Equipment used: 1 person hand held assist Transfers: Sit to/from  [facility] Sit to Stand: Mod assist;+2 physical assistance Stand pivot transfers: +2 physical assistance;Max assist       General transfer comment: Sit to stand 1x from EOB, cuing pt to lean anteriorly and wrap arms around PT anterior to him, modAx2 to power up to stand and facilitate hip and knee extension with bil knee block. MaxAx2 to stand pivot to R as pt prematurely reaching for chair armrest to direct buttocks to chair, poro bil hip and knee extension needing directing and knee blocking.  Ambulation/Gait Ambulation/Gait assistance: Max assist;+2 physical assistance Gait Distance (Feet): 1 Feet Assistive device: 1 person hand held assist Gait Pattern/deviations: Decreased stride length;Trunk flexed;Narrow base of support Gait velocity: reduced Gait velocity interpretation: <1.31 ft/sec, indicative of household ambulator General Gait Details: Pt with poor bil hip and knee extension, needing knee blocking to encourage upright posture. Pt taking x1 step with R foot, but unable to weight shift and lift legs for further steps at this time.   Stairs             Wheelchair Mobility    Modified Rankin (Stroke Patients Only) Modified Rankin (Stroke Patients Only) Pre-Morbid Rankin Score: Slight disability Modified Rankin: Severe disability     Balance Overall balance assessment: Needs assistance Sitting-balance support: Feet supported;Bilateral upper extremity supported Sitting balance-Leahy Scale: Poor Sitting balance - Comments: Bil UE support, ranging from min guard-modA, cuing to find midline and lean anteriorly.   Standing balance support: Single extremity supported;During functional activity Standing balance-Leahy Scale: Poor Standing balance comment: Reliant on UE support.  Cognition Arousal/Alertness: Lethargic Behavior During Therapy: Flat affect Overall Cognitive Status: History of cognitive impairments - at  baseline                                 General Comments: oriented to location as hospital, but stated date was "April 2002". Pt lethargic and needing repeated cues to regain his attention and arouse him, but pt following simple commands with delay and willing to participate and motivated to improve.      Exercises      General Comments General comments (skin integrity, edema, etc.): VSS on RA      Pertinent Vitals/Pain Pain Assessment: Faces Faces Pain Scale: Hurts a little bit Pain Location: headache Pain Descriptors / Indicators: Headache Pain Intervention(s): Limited activity within patient's tolerance;Monitored during session;Repositioned;Other (comment) (notified RN)    Home Living                      Prior Function            PT Goals (current goals can now be found in the care plan section) Acute Rehab PT Goals Patient Stated Goal: agreeable to session and goal to get to chair PT Goal Formulation: With patient Time For Goal Achievement: 06/13/21 Potential to Achieve Goals: Good Progress towards PT goals: Progressing toward goals    Frequency    Min 4X/week      PT Plan Current plan remains appropriate    Co-evaluation              AM-PAC PT "6 Clicks" Mobility   Outcome Measure  Help needed turning from your back to your side while in a flat bed without using bedrails?: A Lot Help needed moving from lying on your back to sitting on the side of a flat bed without using bedrails?: Total Help needed moving to and from a bed to a chair (including a wheelchair)?: Total Help needed standing up from a chair using your arms (e.g., wheelchair or bedside chair)?: Total Help needed to walk in hospital room?: Total Help needed climbing 3-5 steps with a railing? : Total 6 Click Score: 7    End of Session Equipment Utilized During Treatment: Gait belt Activity Tolerance: Patient tolerated treatment well;Patient limited by  lethargy Patient left: with call bell/phone within reach;in chair;with chair alarm set Nurse Communication: Mobility status;Other (comment);Need for lift equipment (headache and BP) PT Visit Diagnosis: Other abnormalities of gait and mobility (R26.89);Unsteadiness on feet (R26.81);Muscle weakness (generalized) (M62.81);Difficulty in walking, not elsewhere classified (R26.2);Other symptoms and signs involving the nervous system (R29.898)     Time: 7829-5621 PT Time Calculation (min) (ACUTE ONLY): 17 min  Charges:  $Therapeutic Activity: 8-22 mins                     Moishe Spice, PT, DPT Acute Rehabilitation Services  Pager: [phone] Office: [place] 05/31/2021, 10:33 AM

## 2021-05-31 NOTE — Telephone Encounter (Signed)
My chart message sent to pt.

## 2021-06-01 ENCOUNTER — Inpatient Hospital Stay (HOSPITAL_COMMUNITY): Payer: Medicare Other

## 2021-06-01 DIAGNOSIS — E43 Unspecified severe protein-calorie malnutrition: Secondary | ICD-10-CM | POA: Diagnosis not present

## 2021-06-01 DIAGNOSIS — I610 Nontraumatic intracerebral hemorrhage in hemisphere, subcortical: Secondary | ICD-10-CM | POA: Diagnosis not present

## 2021-06-01 DIAGNOSIS — E785 Hyperlipidemia, unspecified: Secondary | ICD-10-CM

## 2021-06-01 LAB — COMPREHENSIVE METABOLIC PANEL
ALT: 13 U/L (ref 0–44)
AST: 37 U/L (ref 15–41)
Albumin: 3.3 g/dL — ABNORMAL LOW (ref 3.5–5.0)
Alkaline Phosphatase: 46 U/L (ref 38–126)
Anion gap: 7 (ref 5–15)
BUN: 43 mg/dL — ABNORMAL HIGH (ref 8–23)
CO2: 27 mmol/L (ref 22–32)
Calcium: 10.5 mg/dL — ABNORMAL HIGH (ref 8.9–10.3)
Chloride: 111 mmol/L (ref 98–111)
Creatinine, Ser: 2.11 mg/dL — ABNORMAL HIGH (ref 0.61–1.24)
GFR, Estimated: 33 mL/min — ABNORMAL LOW (ref >60)
Glucose, Bld: 123 mg/dL — ABNORMAL HIGH (ref 70–99)
Potassium: 3.9 mmol/L (ref 3.5–5.1)
Sodium: 145 mmol/L (ref 135–145)
Total Bilirubin: 0.6 mg/dL (ref 0.3–1.2)
Total Protein: 7 g/dL (ref 6.5–8.1)

## 2021-06-01 LAB — URINALYSIS, ROUTINE W REFLEX MICROSCOPIC
Bilirubin Urine: NEGATIVE
Glucose, UA: NEGATIVE mg/dL
Hgb urine dipstick: NEGATIVE
Ketones, ur: NEGATIVE mg/dL
Leukocytes,Ua: NEGATIVE
Nitrite: NEGATIVE
Protein, ur: NEGATIVE mg/dL
Specific Gravity, Urine: 1.013 (ref 1.005–1.030)
pH: 7 (ref 5.0–8.0)

## 2021-06-01 LAB — GLUCOSE, CAPILLARY
Glucose-Capillary: 125 mg/dL — ABNORMAL HIGH (ref 70–99)
Glucose-Capillary: 110 mg/dL — ABNORMAL HIGH (ref 70–99)
Glucose-Capillary: 142 mg/dL — ABNORMAL HIGH (ref 70–99)
Glucose-Capillary: 158 mg/dL — ABNORMAL HIGH (ref 70–99)
Glucose-Capillary: 131 mg/dL — ABNORMAL HIGH (ref 70–99)

## 2021-06-01 LAB — CBC
HCT: 30.2 % — ABNORMAL LOW (ref 39.0–52.0)
Hemoglobin: 9.9 g/dL — ABNORMAL LOW (ref 13.0–17.0)
MCH: 31.4 pg (ref 26.0–34.0)
MCHC: 32.8 g/dL (ref 30.0–36.0)
MCV: 95.9 fL (ref 80.0–100.0)
Platelets: 151 10*3/uL (ref 150–400)
RBC: 3.15 MIL/uL — ABNORMAL LOW (ref 4.22–5.81)
RDW: 13.9 % (ref 11.5–15.5)
WBC: 8.6 10*3/uL (ref 4.0–10.5)
nRBC: 0 % (ref 0.0–0.2)

## 2021-06-01 LAB — RESP PANEL BY RT-PCR (FLU A&B, COVID) ARPGX2
Influenza A by PCR: NEGATIVE
Influenza B by PCR: NEGATIVE
SARS Coronavirus 2 by RT PCR: NEGATIVE

## 2021-06-01 LAB — MAGNESIUM: Magnesium: 1.7 mg/dL (ref 1.7–2.4)

## 2021-06-01 MED ORDER — ACETAMINOPHEN 160 MG/5ML PO SOLN
325.0000 mg | Freq: Four times a day (QID) | ORAL | Status: DC | PRN
  Filled 2021-06-01: qty 20.3

## 2021-06-01 MED ORDER — ACETAMINOPHEN 650 MG RE SUPP
650.0000 mg | RECTAL | Status: DC | PRN
  Administered 2021-06-01: 650 mg via RECTAL
  Filled 2021-06-01: qty 1

## 2021-06-01 MED ORDER — PANTOPRAZOLE SODIUM 40 MG PO PACK
40.0000 mg | PACK | Freq: Every day | ORAL | Status: DC
  Administered 2021-06-01 – 2021-06-16 (×16): 40 mg
  Filled 2021-06-01 (×16): qty 20

## 2021-06-01 MED ORDER — SODIUM CHLORIDE 0.9 % IV SOLN
3.0000 g | Freq: Two times a day (BID) | INTRAVENOUS | Status: AC
  Administered 2021-06-01 – 2021-06-08 (×14): 3 g via INTRAVENOUS
  Filled 2021-06-01: qty 8
  Filled 2021-06-01 (×2): qty 3
  Filled 2021-06-01: qty 8
  Filled 2021-06-01 (×2): qty 3
  Filled 2021-06-01: qty 8
  Filled 2021-06-01 (×6): qty 3
  Filled 2021-06-01: qty 8

## 2021-06-01 MED ORDER — ACETAMINOPHEN 160 MG/5ML PO SOLN
650.0000 mg | Freq: Four times a day (QID) | ORAL | Status: DC | PRN
  Administered 2021-06-01 – 2021-06-14 (×19): 650 mg
  Filled 2021-06-01 (×19): qty 20.3

## 2021-06-01 MED ORDER — MAGNESIUM SULFATE 2 GM/50ML IV SOLN
2.0000 g | Freq: Once | INTRAVENOUS | Status: AC
  Administered 2021-06-01: 2 g via INTRAVENOUS
  Filled 2021-06-01: qty 50

## 2021-06-01 NOTE — Progress Notes (Signed)
Physical Therapy Treatment Patient Details Name: Casey Greene MRN: [id number] DOB: [birth year] Today's Date: 06/01/2021    History of Present Illness 66 male admitted to [facility] on 7/3 with R thalamic hemorrhage with intraventricular extension and hydrocephalus, no plan for neurosurgical intervention. PMH includes CKD stage III, hyperlipidemia, essential hypertension, LVH, mild neurocognitive disorder on Aricept, peripheral vascular disease, chronic diastolic heart failure, ACD, type 2 diabetes mellitus, and remote strokes identified on MRI in January 2022- remote pontine and bilateral thalamic infarcts and subacute infarcts identified on imaging in June 2022 on aspirin and clopidogrel.    PT Comments    Pt more lethargic this date. Twice aroused enough to respond "good morning" but then quickly back to sleep despite ROM x 4 extremities, cold washcloth to face (hand over hand) and sitting EOB.     Follow Up Recommendations  CIR     Equipment Recommendations  None recommended by PT    Recommendations for Other Services       Precautions / Restrictions Precautions Precautions: Fall Precaution Comments: BP <140    Mobility  Bed Mobility Overal bed mobility: Needs Assistance Bed Mobility: Rolling;Sidelying to Sit;Sit to Sidelying Rolling: +2 for physical assistance;Total assist Sidelying to sit: Total assist;+2 for physical assistance     Sit to sidelying: Total assist;+2 for physical assistance General bed mobility comments: pt lethargic and with minimally incr arousal once pbrought up to EOB; briefly opened eyes and tried to say good morning (mouth very dry and not audible)    Transfers                    Ambulation/Gait                 Stairs             Wheelchair Mobility    Modified Rankin (Stroke Patients Only) Modified Rankin (Stroke Patients Only) Pre-Morbid Rankin Score: Slight disability Modified Rankin: Severe disability     Balance  Overall balance assessment: Needs assistance Sitting-balance support: Feet supported;Bilateral upper extremity supported Sitting balance-Leahy Scale: Zero Sitting balance - Comments: pt lethargic and required max assist to maintain balance                                    Cognition Arousal/Alertness: Lethargic Behavior During Therapy: Flat affect Overall Cognitive Status: Difficult to assess                                        Exercises Other Exercises Other Exercises: PROM x 4 extremities for incr stimulation    General Comments General comments (skin integrity, edema, etc.): Pt briefly aroused in supine and therefore assisted to sit EOB with hopes of incr arousal, however even with PROM, cold washcloth pt with bare incr in arousal and then back to eyes closed and asleep      Pertinent Vitals/Pain Pain Assessment: Faces Faces Pain Scale: No hurt    Home Living                      Prior Function            PT Goals (current goals can now be found in the care plan section) Acute Rehab PT Goals Patient Stated Goal: lethargic and unable to state/participate with goals Time For Goal  Achievement: 06/13/21 Potential to Achieve Goals: Good Progress towards PT goals: Not progressing toward goals - comment (more lethargic)    Frequency    Min 4X/week      PT Plan Current plan remains appropriate    Co-evaluation              AM-PAC PT "6 Clicks" Mobility   Outcome Measure  Help needed turning from your back to your side while in a flat bed without using bedrails?: Total Help needed moving from lying on your back to sitting on the side of a flat bed without using bedrails?: Total Help needed moving to and from a bed to a chair (including a wheelchair)?: Total Help needed standing up from a chair using your arms (e.g., wheelchair or bedside chair)?: Total Help needed to walk in hospital room?: Total Help needed  climbing 3-5 steps with a railing? : Total 6 Click Score: 6    End of Session   Activity Tolerance: Patient limited by lethargy Patient left: with call bell/phone within reach;in bed;with bed alarm set;with family/visitor present   PT Visit Diagnosis: Other abnormalities of gait and mobility (R26.89);Unsteadiness on feet (R26.81);Muscle weakness (generalized) (M62.81);Difficulty in walking, not elsewhere classified (R26.2);Other symptoms and signs involving the nervous system (R29.898)     Time: 6384-5364 PT Time Calculation (min) (ACUTE ONLY): 18 min  Charges:  $Therapeutic Activity: 8-22 mins                      Arby Barrette, PT Pager [phone]    Rexanne Mano 06/01/2021, 2:33 PM

## 2021-06-01 NOTE — Progress Notes (Addendum)
PROGRESS NOTE        PATIENT DETAILS Name: Casey Greene Age: 70 y.o. Sex: male Date of Birth: Jan 20, 1951 Admit Date: 05/28/2021 Admitting Physician Amie Portland, MD FTD:DUKG, Lillette Boxer, MD  Brief Narrative: Patient is a 70 y.o. male with history of CVA, mild neurocognitive dysfunction, PAD, chronic diastolic heart failure, DM-2, HTN, CKD stage IV who presented with left-sided hemiplegia-found to have acute right thalamic hemorrhage.  Admitted by neurology to neurointensive care-upon stability-transferred to [facility].  See below for further details.  Significant events: 7/3>> presented with left-sided hemiplegia-right thalamic hemorrhage on CT-admit to neuro ICU 7/7>> transfer to [facility]  Significant studies: 6/12>> TTE: EF 25-42%, grade 2 diastolic dysfunction. 7/3>> acute right thalamic hemorrhage with intraventricular extension and hydrocephalus. 7/3>> CT head: Unchanged size of intraparenchymal hemorrhage 7/4>> MRI brain: 2 small acute infarct in rt parietal lobe, acute right thalamic hemorrhage with intraventricular extension. 7/4>> MRA brain: No significant stenosis or aneurysm 7/4>> MRA neck: No significant stenosis 7/4>> LDL: 50 7/4>> A1c: 5.1  Antimicrobial therapy: None  Microbiology data: 7/7>> blood culture: Pending  Procedures : None  Consults: Neurology, neurosurgery  DVT Prophylaxis :   SCDs  Subjective: Still with dense left hemiplegia-accumulating secretions and gurgling at times when I was in the room.  Afebrile overnight.   Assessment/Plan: Right thalamic intracranial hemorrhage with intraventricular extension and hydrocephalus: Continues to have significant left hemiplegia-dysphagia-on NG tube feedings.  Work-up as above.  Neurology following with recommendations to keep SBP less than 160.  Continue PT/OT/SLP follow-up.  Dysphagia: Due to above-on NG tube feedings-accumulating secretions at times-at risk for aspiration-have asked  our staff to suction and do oral care.  Suspect may require PEG tube if no improvement over the next few days-May need palliative care discussion with family as well-as risk of aspiration remains significant.  Fever: Febrile overnight-probably aspiration given the fact that he is accumulating secretions-obtain CXR, UA, blood cultures.  Given stability-await work-up before initiating antimicrobial therapy.  ? COVID PCR not done on admission-May need to consider doing this if no sources of infection is apparent.  Addendum: continues to be febrile-clinically he appears to have aspiration PNA-althought CXR negative. Will repeat CXR tomorrow-starting Unasyn. Will send out COVID PCR as well.   HTN: BP relatively stable-goal SBP less than 160 per neurology-continue Cozaar, hydralazine, and Coreg.  On as needed hydralazine and labetalol as well.  Follow and adjust.  HLD: On statins prior to this hospital stay-resume when oral intake established.  HFpEF: Euvolemic on exam  CKD stage IV: Creatinine close to baseline-watch closely.  History of mild neurocognitive dysfunction: Resume Aricept when able.  Normocytic anemia: Due to CKD-no indication for transfusion-follow CBC periodically.  Nutrition Status: Nutrition Problem: Severe Malnutrition Etiology: chronic illness (Alzheimers dementia, DM, CKD, CHF) Signs/Symptoms: moderate fat depletion, severe fat depletion, moderate muscle depletion, severe muscle depletion, percent weight loss Percent weight loss: 23 % Interventions: Tube feeding, MVI, Prostat    Diet: Diet Order     None        Code Status: Full code   Family Communication: [phone] called on 7/7-voicemail full-unable to leave message.  Addendum: spoke with spouse at bedside   Disposition Plan: Status is: Inpatient  Remains inpatient appropriate because:Inpatient level of care appropriate due to severity of illness  Dispo: The patient is from: Home  Anticipated d/c is to: CIR              Patient currently is not medically stable to d/c.   Difficult to place patient No    Barriers to Discharge: ICH-severe dysphagia-n.p.o. with NG tube-not yet stable for discharge  Antimicrobial agents: Anti-infectives (From admission, onward)    None        Time spent: 78minutes-Greater than 50% of this time was spent in counseling, explanation of diagnosis, planning of further management, and coordination of care.  MEDICATIONS: Scheduled Meds:  carvedilol  6.25 mg Per NG tube BID WC   chlorhexidine  15 mL Mouth Rinse BID   Chlorhexidine Gluconate Cloth  6 each Topical Q0600   feeding supplement (PROSource TF)  45 mL Per Tube BID   hydrALAZINE  100 mg Per Tube TID   losartan  50 mg Per NG tube Daily   mouth rinse  15 mL Mouth Rinse q12n4p   multivitamin with minerals  1 tablet Per Tube Daily   pantoprazole sodium  40 mg Per Tube QHS   senna-docusate  1 tablet Per Tube BID   sodium chloride flush  3 mL Intravenous Once   Continuous Infusions:  feeding supplement (OSMOLITE 1.5 CAL) 1,000 mL (05/31/21 2144)   PRN Meds:.acetaminophen (TYLENOL) oral liquid 160 mg/5 mL, hydrALAZINE, labetalol   PHYSICAL EXAM: Vital signs: Vitals:   06/01/21 0420 06/01/21 0425 06/01/21 0440 06/01/21 0815  BP: (!) 177/101  (!) 141/93 (!) 159/88  Pulse:   88 95  Resp:   18 18  Temp:  (!) 102 F (38.9 C) 100 F (37.8 C) (!) 100.4 F (38 C)  TempSrc:  Axillary Oral Oral  SpO2:   100% 100%  Weight:      Height:       Filed Weights   05/28/21 1924 05/31/21 0500  Weight: 73.9 kg 71.4 kg   Body mass index is 22.59 kg/m.   Gen Exam:Alert awake-not in any distress HEENT:atraumatic, normocephalic Chest: Mostly clear-but occasional transmitted upper airway sounds. CVS:S1S2 regular Abdomen:soft non tender, non distended Extremities:no edema Neurology: Dense left-sided hemiplegia Skin: no rash  I have personally reviewed following labs  and imaging studies  LABORATORY DATA: CBC: Recent Labs  Lab 05/28/21 1752 05/28/21 1758 06/01/21 0825  WBC 6.8  --  8.6  NEUTROABS 6.3  --   --   HGB 10.5* 11.2* 9.9*  HCT 31.9* 33.0* 30.2*  MCV 96.4  --  95.9  PLT 173  --  458    Basic Metabolic Panel: Recent Labs  Lab 05/28/21 1752 05/28/21 1758 05/30/21 1559 05/30/21 1812 05/31/21 0458 05/31/21 1452 06/01/21 0825  NA 141 142  --   --   --   --  145  K 3.5 3.5  --   --   --   --  3.9  CL 103 105  --   --   --   --  111  CO2 28  --   --   --   --   --  27  GLUCOSE 119* 112*  --   --   --   --  123*  BUN 35* 34*  --   --   --   --  43*  CREATININE 2.20* 2.20*  --   --   --   --  2.11*  CALCIUM 10.8*  --   --   --   --   --  10.5*  MG  --   --  1.4* 1.5* 1.7 1.5* 1.7  PHOS  --   --  2.7 2.6 2.1* 2.7  --     GFR: Estimated Creatinine Clearance: 32 mL/min (A) (by C-G formula based on SCr of 2.11 mg/dL (H)).  Liver Function Tests: Recent Labs  Lab 05/28/21 1752 05/29/21 0958 06/01/21 0825  AST 29 29 37  ALT 14 12 13   ALKPHOS 51 48 46  BILITOT 1.1 0.7 0.6  PROT 7.6 7.2 7.0  ALBUMIN 4.2 3.7 3.3*   No results for input(s): LIPASE, AMYLASE in the last 168 hours. Recent Labs  Lab 05/29/21 0958  AMMONIA 17    Coagulation Profile: Recent Labs  Lab 05/28/21 1752  INR 1.1    Cardiac Enzymes: No results for input(s): CKTOTAL, CKMB, CKMBINDEX, TROPONINI in the last 168 hours.  BNP (last 3 results) Recent Labs    02/03/21 1100  PROBNP 635*    Lipid Profile: No results for input(s): CHOL, HDL, LDLCALC, TRIG, CHOLHDL, LDLDIRECT in the last 72 hours.  Thyroid Function Tests: No results for input(s): TSH, T4TOTAL, FREET4, T3FREE, THYROIDAB in the last 72 hours.  Anemia Panel: No results for input(s): VITAMINB12, FOLATE, FERRITIN, TIBC, IRON, RETICCTPCT in the last 72 hours.  Urine analysis:    Component Value Date/Time   COLORURINE YELLOW 06/01/2021 0710   APPEARANCEUR HAZY (A) 06/01/2021 0710    LABSPEC 1.013 06/01/2021 0710   PHURINE 7.0 06/01/2021 0710   GLUCOSEU NEGATIVE 06/01/2021 0710   GLUCOSEU NEGATIVE 03/28/2018 1206   HGBUR NEGATIVE 06/01/2021 0710   BILIRUBINUR NEGATIVE 06/01/2021 0710   KETONESUR NEGATIVE 06/01/2021 0710   PROTEINUR NEGATIVE 06/01/2021 0710   UROBILINOGEN 0.2 03/28/2018 1206   NITRITE NEGATIVE 06/01/2021 0710   LEUKOCYTESUR NEGATIVE 06/01/2021 0710    Sepsis Labs: Lactic Acid, Venous    Component Value Date/Time   LATICACIDVEN 1.1 05/05/2021 2200    MICROBIOLOGY: Recent Results (from the past 240 hour(s))  MRSA Next Gen by PCR, Nasal     Status: None   Collection Time: 05/28/21  8:13 PM   Specimen: Nasal Mucosa; Nasal Swab  Result Value Ref Range Status   MRSA by PCR Next Gen NOT DETECTED NOT DETECTED Final    Comment: (NOTE) The GeneXpert MRSA Assay (FDA approved for NASAL specimens only), is one component of a comprehensive MRSA colonization surveillance program. It is not intended to diagnose MRSA infection nor to guide or monitor treatment for MRSA infections. Test performance is not FDA approved in patients less than 89 years old. Performed at [facility], [place] [address] [place], [place] [address]     RADIOLOGY STUDIES/RESULTS: DG Abd Portable 1V  Result Date: 05/30/2021 CLINICAL DATA:  Feeding tube placement. EXAM: PORTABLE ABDOMEN - 1 VIEW COMPARISON:  None. FINDINGS: The bowel gas pattern is normal. Distal tip of feeding tube is seen in expected position of distal stomach. No radio-opaque calculi or other significant radiographic abnormality are seen. IMPRESSION: Distal tip of feeding tube seen in expected position of distal stomach. Electronically Signed   By: Marijo Conception M.D.   On: 05/30/2021 15:32     LOS: 4 days   Oren Binet, MD  [facility]    To contact the attending provider between 7A-7P or the covering provider during after hours 7P-7A, please log into the web site [email or web address] and  access using universal [place] password for that web site. If you do not have the password, please call the hospital operator.  06/01/2021, 11:11 AM

## 2021-06-01 NOTE — Telephone Encounter (Signed)
Pt's wife called back in and was asking about holter monitor. She said he was wearing it when he had the stroke, but they took it off in the hospital. She wants to find out if she should send it back or keep it?

## 2021-06-01 NOTE — Progress Notes (Signed)
Inpatient Rehabilitation Admissions Coordinator   Inpatient rehab consult received. Noted patient is febrile and now on airborne precautions. Not yet at a level to consider Cir admit. I will follow.  Danne Baxter, RN, MSN Rehab Admissions Coordinator [phone] 06/01/2021 2:56 PM

## 2021-06-01 NOTE — Telephone Encounter (Signed)
Pls let wife know to just return the holter monitor and we can have them look at the data collected while he was wearing it. But no need to keep it, thanks

## 2021-06-01 NOTE — Progress Notes (Signed)
Pharmacy Antibiotic Note  Casey Greene is a 70 y.o. male admitted on 05/28/2021 with ICH.  Pharmacy has been consulted for Unasyn dosing for aspiration pneumonia. Tmax 102, blood cultures sent. COVID negative.   CKD IV. Renal function borderline for Unasyn q8h vs q12h dosing.  Plan:  Will begin Unasyn 3gm IV q12h.  Will follow renal function, culture data, clinical progress and length of therapy.   Height: 5\' 10"  (177.8 cm) Weight: 71.4 kg (157 lb 6.5 oz) IBW/kg (Calculated) : 73  Temp (24hrs), Avg:100.6 F (38.1 C), Min:99.8 F (37.7 C), Max:102 F (38.9 C)  Recent Labs  Lab 05/28/21 1752 05/28/21 1758 06/01/21 0825  WBC 6.8  --  8.6  CREATININE 2.20* 2.20* 2.11*    Estimated Creatinine Clearance: 32 mL/min (A) (by C-G formula based on SCr of 2.11 mg/dL (H)).    Allergies  Allergen Reactions   Amlodipine Other (See Comments)    headache   Tamsulosin Hcl     Antimicrobials this admission:  Unasyn 7/7 >>  Dose adjustments this admission:  n/a  Microbiology results:  7/7 blood x 2:  7/7 COVID and flu: negative  7/3 MRSA PCR: negative   Thank you for allowing pharmacy to be a part of this patient's care.  Arty Baumgartner, [place] 06/01/2021 3:21 PM

## 2021-06-01 NOTE — Progress Notes (Signed)
STROKE TEAM PROGRESS NOTE   INTERVAL HISTORY Patient in chair.  His wife is at bedside.  Patient was febrile to 102 degree earlier this morning and has become sleepy and hard to arouse.  Blood cultures have been sent likely has aspiration and will be started on antibiotics.  WBC count is normal at 8.6.  CBG is also okay at 142 Vitals:   06/01/21 0425 06/01/21 0440 06/01/21 0815 06/01/21 1156  BP:  (!) 141/93 (!) 159/88 127/60  Pulse:  88 95 90  Resp:  18 18 16   Temp: (!) 102 F (38.9 C) 100 F (37.8 C) (!) 100.4 F (38 C) (!) 100.8 F (38.2 C)  TempSrc: Axillary Oral Oral Oral  SpO2:  100% 100% 98%  Weight:      Height:       CBC:  Recent Labs  Lab 05/28/21 1752 05/28/21 1758 06/01/21 0825  WBC 6.8  --  8.6  NEUTROABS 6.3  --   --   HGB 10.5* 11.2* 9.9*  HCT 31.9* 33.0* 30.2*  MCV 96.4  --  95.9  PLT 173  --  035   Basic Metabolic Panel:  Recent Labs  Lab 05/28/21 1752 05/28/21 1758 05/30/21 1559 05/31/21 0458 05/31/21 1452 06/01/21 0825  NA 141 142  --   --   --  145  K 3.5 3.5  --   --   --  3.9  CL 103 105  --   --   --  111  CO2 28  --   --   --   --  27  GLUCOSE 119* 112*  --   --   --  123*  BUN 35* 34*  --   --   --  43*  CREATININE 2.20* 2.20*  --   --   --  2.11*  CALCIUM 10.8*  --   --   --   --  10.5*  MG  --   --    < > 1.7 1.5* 1.7  PHOS  --   --    < > 2.1* 2.7  --    < > = values in this interval not displayed.   Lipid Panel:  Recent Labs  Lab 05/29/21 0224  CHOL 114  TRIG 48  HDL 54  CHOLHDL 2.1  VLDL 10  LDLCALC 50   HgbA1c:  Recent Labs  Lab 05/29/21 0224  HGBA1C 5.1    IMAGING past 24 hours DG Chest Port 1V same Day  Result Date: 06/01/2021 CLINICAL DATA:  Shortness of breath. EXAM: PORTABLE CHEST 1 VIEW COMPARISON:  May 05, 2021. FINDINGS: The heart size and mediastinal contours are within normal limits. Both lungs are clear. Feeding tube is seen entering stomach. The visualized skeletal structures are unremarkable.  IMPRESSION: No active disease. Electronically Signed   By: Marijo Conception M.D.   On: 06/01/2021 11:23    PHYSICAL EXAM General: Appears well-developed elderly African-American male, not in distress Psych: Affect appropriate to situation Eyes: No scleral injection HENT: No OP obstrucion Head: Normocephalic.  Cardiovascular: Normal rate and regular rhythm.  Respiratory: Effort normal and breath sounds normal to anterior ascultation GI: Soft.  No distension. There is no tenderness.  Skin: WDI    Neurological Examination Mental Status: Lethargic and can be barely aroused.  Diminished attention, registration and recall.  Speech is nonfluent and slow, mild dysarthric, but fluent without evidence of aphasia. Able to follow 3 step commands slowly, with some difficulty. Cranial  Nerves: Right gaze pref, but able to cross midline, VF seem full, but more difficult on left for him. Left facial weakness without sensation deficit Motor:Tone and bulk:normal tone throughout; no atrophy noted. Left side has drift with much effort. There is asterixis noted in bilat arms/hands.  Sensory: diminished to light touch on left Plantars: Right: downgoing   Left: downgoing Cerebellar:unable Gait: did not test  ASSESSMENT/PLAN [name] is a 70 y.o. male with history of previous strokes, HTN, HLD, mild neurocognitive disorder, PVD, chronic diastolic heart failure, and DM2 who presented to the ED as a code stroke for evaluation of acute left upper and lower extremity weakness; noticed by family to be dragging his left foot while assisting him to bed. Initial ICH score: 1  ICH:  Right thalamic and IVH secondary to HTN mild cytotoxic edema and obstructive hydrocephalus Code Stroke CT head show ICH/IVH as above MRI  acute right thalamic hemorrhage with surrounding edema, IVH extension, mild effacement of 3rd ventricale, stable mild hydrocephalus. Noted 2 other small foci of DWI in right parietal lobe.  Evidence of numerous old strokes and microhemorrhages that appear HTN as etiology. MRA no significant change or stenosis 2D Echo done 05/07/21: 65-70% EF, mild LVH, severe dilation of LA, mitral regurg and calcific MV disease LDL 50 HgbA1c 5.1 VTE prophylaxis - SCDs only d/t bleed    There are no active orders of the following types: Diet, Nourishments.   ASA 81mg  + clopidogrel 75 mg daily prior to admission, now on  none d/t bleed   Therapy recommendations:  CIR Disposition:  pending  Hypertension Home meds:  Coreg, apresoline, cozaar Stable on Clevaprex gtt, will transition to home meds once route avail Long-term BP goal normotensive  Hyperlipidemia Home meds:  Lipitor 40mg , will resume in hospital once route avail LDL 50, goal < 70 Continue statin at discharge  Diabetes type II - no dx HgbA1c 5.1, goal < 7.0 CBGs Recent Labs    06/01/21 0359 06/01/21 0816 06/01/21 1158  GLUCAP 125* 110* 142*      Other Stroke Risk Factors Advanced Age >/= 79  Former Cigarette smoker Hx stroke/TIA Family hx stroke  Diastolic Congestive heart failure  Other Active Problems Lethargy/encephalopathy- d/t ICH and hydrocephalus. NSGY following, no EVD at this time CKD3b Chronic anemia: H/H is 10.5/31 Mild neurocognitive disorder- Started Aricept in May H/o prostate ca-s/p proctectomy and with chronic urinary retention PVD Metabolic panel with LFTs and NH4 added d/t asterixis seen on exam NPO currently- not safe for swallow at this time; may need Cortrak when team avail. Once route is available, will need home meds restarted.   Hospital day # 4  Patient has spiked fever and likely has aspiration pneumonia.  Recommend blood cultures and antibiotics.  Long discussion with wife at the bedside and answered questions.  Patient swallowing function unlikely to improve anytime soon and he will likely need a PEG tube  next week when he is stable.  Discussed with Dr. Sloan Leiter.   Greater than 50%  time during this 25-minute visit was spent in counseling and coordination of care and discussion with care team.  Antony Contras, MD Medical Director [place] Pager: [phone] 06/01/2021 1:27 PM   To contact Stroke Continuity provider, please refer to [email or web address] After hours, contact General Neurology

## 2021-06-02 ENCOUNTER — Inpatient Hospital Stay (HOSPITAL_COMMUNITY): Payer: Medicare Other

## 2021-06-02 DIAGNOSIS — E43 Unspecified severe protein-calorie malnutrition: Secondary | ICD-10-CM | POA: Diagnosis not present

## 2021-06-02 DIAGNOSIS — E785 Hyperlipidemia, unspecified: Secondary | ICD-10-CM | POA: Diagnosis not present

## 2021-06-02 DIAGNOSIS — I610 Nontraumatic intracerebral hemorrhage in hemisphere, subcortical: Secondary | ICD-10-CM | POA: Diagnosis not present

## 2021-06-02 LAB — COMPREHENSIVE METABOLIC PANEL
ALT: 13 U/L (ref 0–44)
AST: 34 U/L (ref 15–41)
Albumin: 3.1 g/dL — ABNORMAL LOW (ref 3.5–5.0)
Alkaline Phosphatase: 37 U/L — ABNORMAL LOW (ref 38–126)
Anion gap: 10 (ref 5–15)
BUN: 55 mg/dL — ABNORMAL HIGH (ref 8–23)
CO2: 23 mmol/L (ref 22–32)
Calcium: 10.8 mg/dL — ABNORMAL HIGH (ref 8.9–10.3)
Chloride: 113 mmol/L — ABNORMAL HIGH (ref 98–111)
Creatinine, Ser: 2.58 mg/dL — ABNORMAL HIGH (ref 0.61–1.24)
GFR, Estimated: 26 mL/min — ABNORMAL LOW (ref >60)
Glucose, Bld: 147 mg/dL — ABNORMAL HIGH (ref 70–99)
Potassium: 3.8 mmol/L (ref 3.5–5.1)
Sodium: 146 mmol/L — ABNORMAL HIGH (ref 135–145)
Total Bilirubin: 0.8 mg/dL (ref 0.3–1.2)
Total Protein: 6.8 g/dL (ref 6.5–8.1)

## 2021-06-02 LAB — CBC
HCT: 27.9 % — ABNORMAL LOW (ref 39.0–52.0)
Hemoglobin: 9.1 g/dL — ABNORMAL LOW (ref 13.0–17.0)
MCH: 31.6 pg (ref 26.0–34.0)
MCHC: 32.6 g/dL (ref 30.0–36.0)
MCV: 96.9 fL (ref 80.0–100.0)
Platelets: 153 10*3/uL (ref 150–400)
RBC: 2.88 MIL/uL — ABNORMAL LOW (ref 4.22–5.81)
RDW: 13.8 % (ref 11.5–15.5)
WBC: 8.7 10*3/uL (ref 4.0–10.5)
nRBC: 0 % (ref 0.0–0.2)

## 2021-06-02 LAB — GLUCOSE, CAPILLARY
Glucose-Capillary: 131 mg/dL — ABNORMAL HIGH (ref 70–99)
Glucose-Capillary: 137 mg/dL — ABNORMAL HIGH (ref 70–99)
Glucose-Capillary: 139 mg/dL — ABNORMAL HIGH (ref 70–99)
Glucose-Capillary: 141 mg/dL — ABNORMAL HIGH (ref 70–99)
Glucose-Capillary: 144 mg/dL — ABNORMAL HIGH (ref 70–99)
Glucose-Capillary: 151 mg/dL — ABNORMAL HIGH (ref 70–99)
Glucose-Capillary: 164 mg/dL — ABNORMAL HIGH (ref 70–99)

## 2021-06-02 LAB — PROCALCITONIN: Procalcitonin: 0.12 ng/mL

## 2021-06-02 LAB — MAGNESIUM: Magnesium: 2.1 mg/dL (ref 1.7–2.4)

## 2021-06-02 MED ORDER — LOSARTAN POTASSIUM 50 MG PO TABS
50.0000 mg | ORAL_TABLET | Freq: Two times a day (BID) | ORAL | Status: DC
  Administered 2021-06-02: 50 mg via NASOGASTRIC
  Filled 2021-06-02: qty 1

## 2021-06-02 MED ORDER — FREE WATER
200.0000 mL | Freq: Four times a day (QID) | Status: DC
  Administered 2021-06-02 – 2021-06-03 (×3): 200 mL

## 2021-06-02 MED ORDER — HEPARIN SODIUM (PORCINE) 5000 UNIT/ML IJ SOLN
5000.0000 [IU] | Freq: Three times a day (TID) | INTRAMUSCULAR | Status: DC
  Administered 2021-06-02 – 2021-06-16 (×44): 5000 [IU] via SUBCUTANEOUS
  Filled 2021-06-02 (×38): qty 1

## 2021-06-02 MED ORDER — FREE WATER
50.0000 mL | Status: DC
  Administered 2021-06-02 (×3): 50 mL

## 2021-06-02 NOTE — Progress Notes (Addendum)
STROKE TEAM PROGRESS NOTE   INTERVAL HISTORY Patient in bed resting comfortably. Neuro exam unchanged.  Slightly febrile 38.2 this morning. Last temp afebrile.  COVID negative, UA negative, blood cultures no growth < 24 hours. Patient currently on unasyn.  Vitals:   06/02/21 0358 06/02/21 0741 06/02/21 0923 06/02/21 1157  BP: 124/72 (!) 178/94 (!) 171/112 (!) 172/97  Pulse: 95 92 96 94  Resp: 20 20  14   Temp: 99.1 F (37.3 C) (!) 100.8 F (38.2 C)  98.5 F (36.9 C)  TempSrc: Oral Axillary  Oral  SpO2: 99% 100%  97%  Weight:      Height:       CBC:  Recent Labs  Lab 05/28/21 1752 05/28/21 1758 06/01/21 0825 06/02/21 0259  WBC 6.8  --  8.6 8.7  NEUTROABS 6.3  --   --   --   HGB 10.5*   < > 9.9* 9.1*  HCT 31.9*   < > 30.2* 27.9*  MCV 96.4  --  95.9 96.9  PLT 173  --  151 153   < > = values in this interval not displayed.   Basic Metabolic Panel:  Recent Labs  Lab 05/31/21 0458 05/31/21 1452 06/01/21 0825 06/02/21 0259  NA  --   --  145 146*  K  --   --  3.9 3.8  CL  --   --  111 113*  CO2  --   --  27 23  GLUCOSE  --   --  123* 147*  BUN  --   --  43* 55*  CREATININE  --   --  2.11* 2.58*  CALCIUM  --   --  10.5* 10.8*  MG 1.7 1.5* 1.7 2.1  PHOS 2.1* 2.7  --   --    Lipid Panel:  Recent Labs  Lab 05/29/21 0224  CHOL 114  TRIG 48  HDL 54  CHOLHDL 2.1  VLDL 10  LDLCALC 50   HgbA1c:  Recent Labs  Lab 05/29/21 0224  HGBA1C 5.1    IMAGING past 24 hours DG Chest Port 1 View  Result Date: 06/02/2021 CLINICAL DATA:  Short of breath EXAM: PORTABLE CHEST 1 VIEW COMPARISON:  None. FINDINGS: Feeding tube extends the stomach. Lungs are clear. No pneumonia, infiltrate pneumothorax IMPRESSION: Lungs are clear Electronically Signed   By: Casey Greene M.D.   On: 06/02/2021 09:06    PHYSICAL EXAM General: Appears well-developed elderly African-American male, not in distress Psych: Affect appropriate to situation Eyes: No scleral injection HENT: No OP  obstrucion Head: Normocephalic.  Cardiovascular: Normal rate and regular rhythm.  Respiratory: Effort normal and breath sounds normal to anterior ascultation GI: Soft.  No distension. There is no tenderness.  Skin: WDI    Neurological Examination Mental Status: Patient sleeping. Aroused to voice.   Able to follow commands slowly. Cranial Nerves: Right gaze pref, but able to cross midline, VF seem full, but more difficult on left for him. Left facial weakness without sensation deficit Motor:Tone and bulk:normal tone throughout; no atrophy noted. Left side has drift with much effort. There is asterixis noted in bilat arms/hands.  Sensory: diminished to light touch on left Plantars: Right: downgoing   Left: downgoing Cerebellar:unable Gait: did not test  ASSESSMENT/PLAN Casey Greene is a 70 y.o. male with history of previous strokes, HTN, HLD, mild neurocognitive disorder, PVD, chronic diastolic heart failure who presented to the ED as a code stroke for evaluation of acute left upper and  lower extremity weakness; noticed by family to be dragging his left foot while assisting him to bed. Initial ICH score: 1  ICH:  Right thalamic ICH with IVH secondary to HTN, with mild cytotoxic edema and obstructive hydrocephalus Code Stroke CT head show ICH/IVH as above MRI  acute right thalamic ICH with surrounding edema, IVH extension, mild effacement of 3rd ventricale, stable mild hydrocephalus. Noted 2 other small foci of DWI in right parietal lobe. Evidence of numerous old strokes and microhemorrhages that appear HTN as etiology. MRA head and neck no significant change or stenosis CT repeat in am pending 2D Echo 05/07/21 - EF 65-70% EF, mild LVH, severe dilation of LA LDL 50 HgbA1c 5.1 VTE prophylaxis - heparin subq ASA 81mg  + clopidogrel 75 mg daily prior to admission, now on none d/t ICH Therapy recommendations:  CIR Disposition:  pending  Hypertensive emergency Home meds:  Coreg,  apresoline, cozaar Off Clevaprex gtt Now on coreg, hydralazine, losartan Long-term BP goal normotensive  Hyperlipidemia Home meds:  Lipitor 40mg  LDL 50, goal < 70 Continue home statin at discharge  Fever  Tmax 102.1->100.8 COVID negative UA negative blood cultures no growth < 24 hours.  CXR clear Likely aspiration, patient currently on unasyn  Dysphagia  Did not pass swallow On TF via cortrak @ 55 On FW 200 Q6 Speech on board  Other Stroke Risk Factors Advanced Age >/= 29  Former Cigarette smoker Hx stroke/TIA on imaging Family hx stroke  Diastolic Congestive heart failure PVD  Other Active Problems CKD3b, Cre 2.11->2.58 Chronic anemia due to CKD Mild neurocognitive impairment - Started Aricept in May H/o prostate ca-s/p proctectomy and with chronic urinary retention  Hospital day # [place], MSN, NP-C [facility] [phone]  ATTENDING NOTE: I reviewed above note and agree with the assessment and plan. Pt was seen and examined.   70 year old male with history of stroke, hypertension, hyperlipidemia, MCI, PVD, CHF admitted for left sided weakness.  CT head showed right thalamic ICH with IVH, which confirmed on MRI.  MRA head and neck negative.  EF 65 to 70%, LDL 50, A1c 5.1.  On exam, patient lying in bed, lethargic, drowsy sleepy but able to arouse with voice and follow simple commands.  Orientated to place and age, not to time.  Paucity of speech, able to name 1/2 and repeat simple sentences.  Right gaze complete, left gaze incomplete, left facial droop, visual field full.  Right upper extremity 4/5, left upper extremity finger grip 2/5, bicep 3/5, proximal 1/5.  Left lower extremity 1/5, right lower extremity 3 -/5.  Sensation subjectively symmetrical.  Right finger-to-nose slow but grossly intact.  Patient BP on the higher end, increase losartan to 100 mg, continue hydralazine and Coreg.  BP monitoring.  Patient had a fever, T-max  102.1, on Unasyn, infectious work-up so far negative, concerning for aspiration.  Elevated sodium level and creatinine, concerning for dehydration, increase free water flush.  Repeat CT in a.m.  For detailed assessment and plan, please refer to above as I have made changes wherever appropriate.   Casey Hawking, MD PhD Stroke Neurology 06/02/2021 2:27 PM    To contact Stroke Continuity provider, please refer to [email or web address] After hours, contact General Neurology

## 2021-06-02 NOTE — Progress Notes (Signed)
OT Cancellation Note  Patient Details Name: Casey Greene MRN: [id number] DOB: 1951-03-15   Cancelled Treatment:    Reason Eval/Treat Not Completed: Other (comment) (Nursing staff giving pt a bath, will return as schedule allows.)  Shanda Howells, OTDS  Shanda Howells 06/02/2021, 4:10 PM

## 2021-06-02 NOTE — Progress Notes (Signed)
PROGRESS NOTE        PATIENT DETAILS Name: Casey Greene Age: 70 y.o. Sex: male Date of Birth: [birth year] Admit Date: 05/28/2021 Admitting Physician Amie [place], MD [id number], Lillette Boxer, MD  Brief Narrative: Patient is a 70 y.o. male with history of CVA, mild neurocognitive dysfunction, PAD, chronic diastolic heart failure, DM-2, HTN, CKD stage IV who presented with left-sided hemiplegia-found to have acute right thalamic hemorrhage.  Admitted by neurology to neurointensive care-upon stability-transferred to [facility].  See below for further details.  Significant events: 7/3>> presented with left-sided hemiplegia-right thalamic hemorrhage on CT-admit to neuro ICU 7/7>> transfer to [facility] 7/7>> febrile-Unasyn started-work-up sent  Significant studies: 6/12>> TTE: EF 36-64%, grade 2 diastolic dysfunction. 7/3>> acute right thalamic hemorrhage with intraventricular extension and hydrocephalus. 7/3>> CT head: Unchanged size of intraparenchymal hemorrhage 7/4>> MRI brain: 2 small acute infarct in rt parietal lobe, acute right thalamic hemorrhage with intraventricular extension. 7/4>> MRA brain: No significant stenosis or aneurysm 7/4>> MRA neck: No significant stenosis 7/4>> LDL: 50 7/4>> A1c: 5.1 7/7>> CXR: No pneumonia 7/8>> CXR: No pneumonia  Antimicrobial therapy: Unasyn: 7/7>>  Microbiology data: 7/7>> COVID/influenza PCR: Negative 7/7>> blood culture: No growth  Procedures : None  Consults: Neurology, neurosurgery, palliative care  DVT Prophylaxis :   SCDs  Subjective: Follows commands-slightly lethargic-febrile overnight but low-grade fever this morning.  Still continues to accumulate secretions-Per nursing staff-large mucous glob was removed with suction this morning.   Assessment/Plan: Right thalamic intracranial hemorrhage with intraventricular extension and hydrocephalus: Continues to have significant left hemiplegia-dysphagia-on NG tube  feedings.  Work-up as above.  Neurology following with recommendations to keep SBP less than 160.  Continue PT/OT/SLP follow-up.  Dysphagia: Due to above-on NG tube feedings-accumulating secretions at times-at risk for aspiration pneumonia.  Long discussion with spouse at bedside on 7/7-she is aware that patient may require PEG tube-but understands that it will not prevent aspiration.  Have consulted palliative care to have goals of care discussion with family before we proceed with PEG tube placement (if no improvement in dysphagia)   Fever-suspicion for microaspiration: Remains febrile but fever curve slowly improving-otherwise stable.  Per nursing staff no diarrhea-no skin breakdown.  Given that he is accumulating secretions-high suspicion that he may be have small amount of pneumonia not seen on chest x-ray.  Continue Unasyn-await cultures.  If he worsens-can broaden antimicrobial therapy.  HTN: BP relatively stable-goal SBP less than 160 per neurology-continue Cozaar, hydralazine, and Coreg.  On as needed hydralazine and labetalol as well.  Follow and adjust.  HLD: On statins prior to this hospital stay-resume when oral intake established.  HFpEF: Euvolemic on exam  Hypernatremia: We will increase free water flushes to 200 cc every 6-and recheck sodium levels in the morning.  CKD stage IV: Slight jump in creatinine overnight-but still not very far from baseline-watch closely.  If creatinine continues to worsen-May need to stop ARB.  History of mild neurocognitive dysfunction: Resume Aricept when able.  Normocytic anemia: Due to CKD-no indication for transfusion-follow CBC periodically.  Nutrition Status: Nutrition Problem: Severe Malnutrition Etiology: chronic illness (Alzheimers dementia, DM, CKD, CHF) Signs/Symptoms: moderate fat depletion, severe fat depletion, moderate muscle depletion, severe muscle depletion, percent weight loss Percent weight loss: 23 % Interventions: Tube  feeding, MVI, Prostat    Diet: Diet Order     None  Code Status: Full code   Family Communication: [phone] spoke at bedside on 7/7.   Disposition Plan: Status is: Inpatient  Remains inpatient appropriate because:Inpatient level of care appropriate due to severity of illness  Dispo: The patient is from: Home              Anticipated d/c is to: CIR              Patient currently is not medically stable to d/c.   Difficult to place patient No    Barriers to Discharge: ICH-severe dysphagia-n.p.o. with NG tube-not yet stable for discharge  Antimicrobial agents: Anti-infectives (From admission, onward)    Start     Dose/Rate Route Frequency Ordered Stop   06/01/21 1500  Ampicillin-Sulbactam (UNASYN) 3 g in sodium chloride 0.9 % 100 mL IVPB        3 g 200 mL/hr over 30 Minutes Intravenous Every 12 hours 06/01/21 1402          Time spent: 35 minutes-Greater than 50% of this time was spent in counseling, explanation of diagnosis, planning of further management, and coordination of care.  MEDICATIONS: Scheduled Meds:  carvedilol  6.25 mg Per NG tube BID WC   chlorhexidine  15 mL Mouth Rinse BID   Chlorhexidine Gluconate Cloth  6 each Topical Q0600   feeding supplement (PROSource TF)  45 mL Per Tube BID   free water  50 mL Per Tube Q4H   hydrALAZINE  100 mg Per Tube TID   losartan  50 mg Per NG tube Daily   mouth rinse  15 mL Mouth Rinse q12n4p   multivitamin with minerals  1 tablet Per Tube Daily   pantoprazole sodium  40 mg Per Tube QHS   senna-docusate  1 tablet Per Tube BID   sodium chloride flush  3 mL Intravenous Once   Continuous Infusions:  ampicillin-sulbactam (UNASYN) IV 3 g (06/02/21 0249)   feeding supplement (OSMOLITE 1.5 CAL) 1,000 mL (05/31/21 2144)   PRN Meds:.acetaminophen (TYLENOL) oral liquid 160 mg/5 mL, hydrALAZINE, labetalol   PHYSICAL EXAM: Vital signs: Vitals:   06/02/21 0358 06/02/21 0741 06/02/21 0923  06/02/21 1157  BP: 124/72 (!) 178/94 (!) 171/112 (!) 172/97  Pulse: 95 92 96 94  Resp: 20 20  14   Temp: 99.1 F (37.3 C) (!) 100.8 F (38.2 C)  98.5 F (36.9 C)  TempSrc: Oral Axillary  Oral  SpO2: 99% 100%  97%  Weight:      Height:       Filed Weights   05/28/21 1924 05/31/21 0500  Weight: 73.9 kg 71.4 kg   Body mass index is 22.59 kg/m.   Gen Exam: Slightly lethargic-but overall awake and alert HEENT:atraumatic, normocephalic Chest: B/L clear to auscultation anteriorly CVS:S1S2 regular Abdomen:soft non tender, non distended Extremities:no edema Neurology: Dense left-sided hemiplegia.  NG tube in place. Skin: no rash   I have personally reviewed following labs and imaging studies  LABORATORY DATA: CBC: Recent Labs  Lab 05/28/21 1752 05/28/21 1758 06/01/21 0825 06/02/21 0259  WBC 6.8  --  8.6 8.7  NEUTROABS 6.3  --   --   --   HGB 10.5* 11.2* 9.9* 9.1*  HCT 31.9* 33.0* 30.2* 27.9*  MCV 96.4  --  95.9 96.9  PLT 173  --  151 153     Basic Metabolic Panel: Recent Labs  Lab 05/28/21 1752 05/28/21 1758 05/30/21 1559 05/30/21 1559 05/30/21 1812 05/31/21 0458 05/31/21 1452 06/01/21 0825 06/02/21 0259  NA  141 142  --   --   --   --   --  145 146*  K 3.5 3.5  --   --   --   --   --  3.9 3.8  CL 103 105  --   --   --   --   --  111 113*  CO2 28  --   --   --   --   --   --  27 23  GLUCOSE 119* 112*  --   --   --   --   --  123* 147*  BUN 35* 34*  --   --   --   --   --  43* 55*  CREATININE 2.20* 2.20*  --   --   --   --   --  2.11* 2.58*  CALCIUM 10.8*  --   --   --   --   --   --  10.5* 10.8*  MG  --   --  1.4*   < > 1.5* 1.7 1.5* 1.7 2.1  PHOS  --   --  2.7  --  2.6 2.1* 2.7  --   --    < > = values in this interval not displayed.     GFR: Estimated Creatinine Clearance: 26.1 mL/min (A) (by C-G formula based on SCr of 2.58 mg/dL (H)).  Liver Function Tests: Recent Labs  Lab 05/28/21 1752 05/29/21 0958 06/01/21 0825 06/02/21 0259  AST 29 29  37 34  ALT 14 12 13 13   ALKPHOS 51 48 46 37*  BILITOT 1.1 0.7 0.6 0.8  PROT 7.6 7.2 7.0 6.8  ALBUMIN 4.2 3.7 3.3* 3.1*    No results for input(s): LIPASE, AMYLASE in the last 168 hours. Recent Labs  Lab 05/29/21 0958  AMMONIA 17     Coagulation Profile: Recent Labs  Lab 05/28/21 1752  INR 1.1     Cardiac Enzymes: No results for input(s): CKTOTAL, CKMB, CKMBINDEX, TROPONINI in the last 168 hours.  BNP (last 3 results) Recent Labs    02/03/21 1100  PROBNP 635*     Lipid Profile: No results for input(s): CHOL, HDL, LDLCALC, TRIG, CHOLHDL, LDLDIRECT in the last 72 hours.  Thyroid Function Tests: No results for input(s): TSH, T4TOTAL, FREET4, T3FREE, THYROIDAB in the last 72 hours.  Anemia Panel: No results for input(s): VITAMINB12, FOLATE, FERRITIN, TIBC, IRON, RETICCTPCT in the last 72 hours.  Urine analysis:    Component Value Date/Time   COLORURINE YELLOW 06/01/2021 0710   APPEARANCEUR HAZY (A) 06/01/2021 0710   LABSPEC 1.013 06/01/2021 0710   PHURINE 7.0 06/01/2021 0710   GLUCOSEU NEGATIVE 06/01/2021 0710   GLUCOSEU NEGATIVE 03/28/2018 1206   HGBUR NEGATIVE 06/01/2021 0710   BILIRUBINUR NEGATIVE 06/01/2021 0710   KETONESUR NEGATIVE 06/01/2021 0710   PROTEINUR NEGATIVE 06/01/2021 0710   UROBILINOGEN 0.2 03/28/2018 1206   NITRITE NEGATIVE 06/01/2021 0710   LEUKOCYTESUR NEGATIVE 06/01/2021 0710    Sepsis Labs: Lactic Acid, Venous    Component Value Date/Time   LATICACIDVEN 1.1 05/05/2021 2200    MICROBIOLOGY: Recent Results (from the past 240 hour(s))  MRSA Next Gen by PCR, Nasal     Status: None   Collection Time: 05/28/21  8:13 PM   Specimen: Nasal Mucosa; Nasal Swab  Result Value Ref Range Status   MRSA by PCR Next Gen NOT DETECTED NOT DETECTED Final    Comment: (NOTE) The GeneXpert MRSA Assay (FDA approved for  NASAL specimens only), is one component of a comprehensive MRSA colonization surveillance program. It is not intended to diagnose  MRSA infection nor to guide or monitor treatment for MRSA infections. Test performance is not FDA approved in patients less than 4 years old. Performed at [facility], [place] [address], [place], [place] [address]   Culture, blood (routine x 2)     Status: None (Preliminary result)   Collection Time: 06/01/21  8:14 AM   Specimen: BLOOD  Result Value Ref Range Status   Specimen Description BLOOD RIGHT ANTECUBITAL  Final   Special Requests   Final    BOTTLES DRAWN AEROBIC AND ANAEROBIC Blood Culture adequate volume   Culture   Final    NO GROWTH < 24 HOURS Performed at [facility], [place] [address] [place], [place] [address]    Report Status PENDING  Incomplete  Culture, blood (routine x 2)     Status: None (Preliminary result)   Collection Time: 06/01/21  8:31 AM   Specimen: BLOOD RIGHT HAND  Result Value Ref Range Status   Specimen Description BLOOD RIGHT HAND  Final   Special Requests   Final    BOTTLES DRAWN AEROBIC AND ANAEROBIC Blood Culture adequate volume   Culture   Final    NO GROWTH < 24 HOURS Performed at [facility], [place] [address] [place], [place] [address]    Report Status PENDING  Incomplete  Resp Panel by RT-PCR (Flu A&B, Covid) Nasopharyngeal Swab     Status: None   Collection Time: 06/01/21  2:02 PM   Specimen: Nasopharyngeal Swab; Nasopharyngeal(NP) swabs in vial transport medium  Result Value Ref Range Status   SARS Coronavirus 2 by RT PCR NEGATIVE NEGATIVE Final    Comment: (NOTE) SARS-CoV-2 target nucleic acids are NOT DETECTED.  The SARS-CoV-2 RNA is generally detectable in upper respiratory specimens during the acute phase of infection. The lowest concentration of SARS-CoV-2 viral copies this assay can detect is 138 copies/mL. A negative result does not preclude SARS-Cov-2 infection and should not be used as the sole basis for treatment or other patient management decisions. A negative result may occur with  improper specimen  collection/handling, submission of specimen other than nasopharyngeal swab, presence of viral mutation(s) within the areas targeted by this assay, and inadequate number of viral copies(<138 copies/mL). A negative result must be combined with clinical observations, patient history, and epidemiological information. The expected result is Negative.  Fact Sheet for Patients:  EntrepreneurPulse.com.au  Fact Sheet for Healthcare Providers:  IncredibleEmployment.be  This test is no t yet approved or cleared by the [place] FDA and  has been authorized for detection and/or diagnosis of SARS-CoV-2 by FDA under an Emergency Use Authorization (EUA). This EUA will remain  in effect (meaning this test can be used) for the duration of the COVID-19 declaration under Section 564(b)(1) of the Act, 21 U.S.C.section 360bbb-3(b)(1), unless the authorization is terminated  or revoked sooner.       Influenza A by PCR NEGATIVE NEGATIVE Final   Influenza B by PCR NEGATIVE NEGATIVE Final    Comment: (NOTE) The Xpert Xpress SARS-CoV-2/FLU/RSV plus assay is intended as an aid in the diagnosis of influenza from Nasopharyngeal swab specimens and should not be used as a sole basis for treatment. Nasal washings and aspirates are unacceptable for Xpert Xpress SARS-CoV-2/FLU/RSV testing.  Fact Sheet for Patients: EntrepreneurPulse.com.au  Fact Sheet for Healthcare Providers: IncredibleEmployment.be  This test is not yet approved  or cleared by the [place] and has been authorized for detection and/or diagnosis of SARS-CoV-2 by FDA under an Emergency Use Authorization (EUA). This EUA will remain in effect (meaning this test can be used) for the duration of the COVID-19 declaration under Section 564(b)(1) of the Act, 21 U.S.C. section 360bbb-3(b)(1), unless the authorization is terminated or revoked.  Performed at [facility], [place] [address] [place], [place] [address]     RADIOLOGY STUDIES/RESULTS: DG Chest Port 1 View  Result Date: 06/02/2021 CLINICAL DATA:  Short of breath EXAM: PORTABLE CHEST 1 VIEW COMPARISON:  None. FINDINGS: Feeding tube extends the stomach. Lungs are clear. No pneumonia, infiltrate pneumothorax IMPRESSION: Lungs are clear Electronically Signed   By: Suzy Bouchard M.D.   On: 06/02/2021 09:06   DG Chest Port 1V same Day  Result Date: 06/01/2021 CLINICAL DATA:  Shortness of breath. EXAM: PORTABLE CHEST 1 VIEW COMPARISON:  May 05, 2021. FINDINGS: The heart size and mediastinal contours are within normal limits. Both lungs are clear. Feeding tube is seen entering stomach. The visualized skeletal structures are unremarkable. IMPRESSION: No active disease. Electronically Signed   By: Marijo Conception M.D.   On: 06/01/2021 11:23     LOS: 5 days   Oren Binet, MD  [facility]    To contact the attending provider between 7A-7P or the covering provider during after hours 7P-7A, please log into the web site [email or web address] and access using universal [place] password for that web site. If you do not have the password, please call the hospital operator.  06/02/2021, 12:31 PM

## 2021-06-02 NOTE — Progress Notes (Signed)
  Speech Language Pathology Treatment: Dysphagia;Cognitive-Linquistic  Patient Details Name: Casey Greene MRN: [id number] DOB: 24-Feb-1951 Today's Date: 06/02/2021 Time: 1607-3710 SLP Time Calculation (min) (ACUTE ONLY): 25 min  Assessment / Plan / Recommendation Clinical Impression  Pt seen for a skilled dysphagia/cognitive tx with functional directives/personal information questions during oral care (extensive)/positioning and PO readiness as pt is receiving primary nutrition via Cortrak.  PEG pending if dysphagia does not improve during tx sessions.  Pt able to follow simple commands during oral care such as lingual protrusion, lateralization and opening/closing mouth.  He answered various personal information questions with max verbal/tactile cues with 50% accuracy.  He responded in primarily 1-word responses such as "thank you" or "good" during tx with an overall 85% intelligibility rating noted.  He consumed ice chips with max multimodal cues with delayed cough and wet vocal quality noted.  Pt's mentation/alertness level impact function as well during sessions.  Wife/daughter educated during session re: swallowing function and potential for continued non-oral nutrition/hydration, oral care frequency/importance during non-oral nutrition, etc.  Family appreciative for information/education.  ST will continue to f/u for PO readiness/cog/dysarthria tx.       HPI HPI: 70 yr old admitted with LUE and LLE weakness. CT revealed acute right thalamic hemorrhage. 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

## 2021-06-03 ENCOUNTER — Inpatient Hospital Stay (HOSPITAL_COMMUNITY): Payer: Medicare Other

## 2021-06-03 DIAGNOSIS — E785 Hyperlipidemia, unspecified: Secondary | ICD-10-CM | POA: Diagnosis not present

## 2021-06-03 DIAGNOSIS — I615 Nontraumatic intracerebral hemorrhage, intraventricular: Secondary | ICD-10-CM

## 2021-06-03 DIAGNOSIS — I610 Nontraumatic intracerebral hemorrhage in hemisphere, subcortical: Secondary | ICD-10-CM | POA: Diagnosis not present

## 2021-06-03 DIAGNOSIS — E43 Unspecified severe protein-calorie malnutrition: Secondary | ICD-10-CM | POA: Diagnosis not present

## 2021-06-03 LAB — CBC
HCT: 28.6 % — ABNORMAL LOW (ref 39.0–52.0)
HCT: 29.5 % — ABNORMAL LOW (ref 39.0–52.0)
Hemoglobin: 9.3 g/dL — ABNORMAL LOW (ref 13.0–17.0)
Hemoglobin: 9.5 g/dL — ABNORMAL LOW (ref 13.0–17.0)
MCH: 31.6 pg (ref 26.0–34.0)
MCH: 31.7 pg (ref 26.0–34.0)
MCHC: 32.5 g/dL (ref 30.0–36.0)
MCHC: 32.2 g/dL (ref 30.0–36.0)
MCV: 97.3 fL (ref 80.0–100.0)
MCV: 98.3 fL (ref 80.0–100.0)
Platelets: 161 10*3/uL (ref 150–400)
Platelets: 167 10*3/uL (ref 150–400)
RBC: 2.94 MIL/uL — ABNORMAL LOW (ref 4.22–5.81)
RBC: 3 MIL/uL — ABNORMAL LOW (ref 4.22–5.81)
RDW: 13.7 % (ref 11.5–15.5)
RDW: 13.5 % (ref 11.5–15.5)
WBC: 10.1 10*3/uL (ref 4.0–10.5)
WBC: 10.5 10*3/uL (ref 4.0–10.5)
nRBC: 0 % (ref 0.0–0.2)
nRBC: 0 % (ref 0.0–0.2)

## 2021-06-03 LAB — COMPREHENSIVE METABOLIC PANEL
ALT: 13 U/L (ref 0–44)
AST: 31 U/L (ref 15–41)
Albumin: 3.1 g/dL — ABNORMAL LOW (ref 3.5–5.0)
Alkaline Phosphatase: 38 U/L (ref 38–126)
Anion gap: 8 (ref 5–15)
BUN: 62 mg/dL — ABNORMAL HIGH (ref 8–23)
CO2: 27 mmol/L (ref 22–32)
Calcium: 11.1 mg/dL — ABNORMAL HIGH (ref 8.9–10.3)
Chloride: 113 mmol/L — ABNORMAL HIGH (ref 98–111)
Creatinine, Ser: 2.56 mg/dL — ABNORMAL HIGH (ref 0.61–1.24)
GFR, Estimated: 26 mL/min — ABNORMAL LOW (ref >60)
Glucose, Bld: 143 mg/dL — ABNORMAL HIGH (ref 70–99)
Potassium: 3.6 mmol/L (ref 3.5–5.1)
Sodium: 148 mmol/L — ABNORMAL HIGH (ref 135–145)
Total Bilirubin: 0.7 mg/dL (ref 0.3–1.2)
Total Protein: 7 g/dL (ref 6.5–8.1)

## 2021-06-03 LAB — GLUCOSE, CAPILLARY
Glucose-Capillary: 136 mg/dL — ABNORMAL HIGH (ref 70–99)
Glucose-Capillary: 115 mg/dL — ABNORMAL HIGH (ref 70–99)
Glucose-Capillary: 139 mg/dL — ABNORMAL HIGH (ref 70–99)
Glucose-Capillary: 143 mg/dL — ABNORMAL HIGH (ref 70–99)

## 2021-06-03 LAB — TYPE AND SCREEN
ABO/RH(D): O POS
Antibody Screen: NEGATIVE

## 2021-06-03 LAB — PROCALCITONIN: Procalcitonin: 0.17 ng/mL

## 2021-06-03 MED ORDER — CARVEDILOL 12.5 MG PO TABS: 12.5000 mg | ORAL_TABLET | Freq: Two times a day (BID) | ORAL | Status: DC

## 2021-06-03 MED ORDER — CARVEDILOL 12.5 MG PO TABS
25.0000 mg | ORAL_TABLET | Freq: Two times a day (BID) | ORAL | Status: DC
  Administered 2021-06-03 – 2021-06-16 (×27): 25 mg via NASOGASTRIC
  Filled 2021-06-03 (×29): qty 2

## 2021-06-03 MED ORDER — FREE WATER
300.0000 mL | Freq: Four times a day (QID) | Status: DC
  Administered 2021-06-03 – 2021-06-04 (×4): 300 mL

## 2021-06-03 NOTE — Progress Notes (Signed)
Paged on call hospitalist, Dr. Hal Hope:  Hi sir.  This patient has positive blood in stool.  He has orders for type and screen.  His status in chart is blood products refusal.  I am with the wife and have confirmed that this was charted in error and he does want blood products if needed.  Can we remove this status?  Thanks, Ronalee Belts, [place], [phone]

## 2021-06-03 NOTE — Progress Notes (Signed)
Occupational Therapy Treatment Patient Details Name: Casey Greene MRN: [id number] DOB: September 21, 1951 Today's Date: 06/03/2021    History of present illness 64 male admitted to [facility] on 7/3 with R thalamic hemorrhage with intraventricular extension and hydrocephalus, no plan for neurosurgical intervention. PMH includes CKD stage III, hyperlipidemia, essential hypertension, LVH, mild neurocognitive disorder on Aricept, peripheral vascular disease, chronic diastolic heart failure, ACD, type 2 diabetes mellitus, and remote strokes identified on MRI in January 2022- remote pontine and bilateral thalamic infarcts and subacute infarcts identified on imaging in June 2022 on aspirin and clopidogrel.   OT comments  Pt. Seen for skilled OT treatment session.  Wife present at bedside.  Attempts at grooming tasks bed level. Pt. Unable to stay awake long enough for full engagement or participation.  Remains total a.  Performed PROM B UEs for cont. Attempts at stimulation and also edema management and education for wife as well.  Reviewed positioning of BUES and also head and neck.  Wife taking notes and very active in understanding how to assist with ROM and positioning.  Will continue to progress with stated OT goals as pt. Able.    Follow Up Recommendations  CIR    Equipment Recommendations  3 in 1 bedside commode    Recommendations for Other Services Rehab consult    Precautions / Restrictions Precautions Precautions: Fall Precaution Comments: BP <140       Mobility Bed Mobility                    Transfers                      Balance                                           ADL either performed or assessed with clinical judgement   ADL Overall ADL's : Needs assistance/impaired     Grooming: Wash/dry face;Oral care;Total assistance;Bed level                                 General ADL Comments: total (A) at this time     Vision        Perception     Praxis      Cognition                                                Exercises Other Exercises Other Exercises: PROM digits, wrist, elbow, shoulder for increased sitmulation and edema management.  wife present and educated on tech. for rom and positioning of BUEs to promote elevation and neutral positioning.  also on gentle head/neck rom as pt. leaning and turning head to L   Shoulder Instructions       General Comments  Has a best friend Mendel Ryder that his wife and dtr. Are working on getting in touch with per the pts. request    Pertinent Vitals/ Pain       Pain Assessment: Faces Faces Pain Scale: Hurts a little bit Pain Location: head/neck rom to R Pain Descriptors / Indicators: Grimacing Pain Intervention(s): Limited activity within patient's tolerance;Monitored during session;Repositioned  Home Living  Prior Functioning/Environment              Frequency  Min 2X/week        Progress Toward Goals  OT Goals(current goals can now be found in the care plan section)  Progress towards OT goals: Not progressing toward goals - comment (unable to arouse for full participation)     Plan Discharge plan remains appropriate    Co-evaluation                 AM-PAC OT "6 Clicks" Daily Activity     Outcome Measure   Help from another person eating meals?: Total Help from another person taking care of personal grooming?: Total Help from another person toileting, which includes using toliet, bedpan, or urinal?: Total Help from another person bathing (including washing, rinsing, drying)?: Total Help from another person to put on and taking off regular upper body clothing?: Total Help from another person to put on and taking off regular lower body clothing?: Total 6 Click Score: 6    End of Session    OT Visit Diagnosis: Unsteadiness on feet (R26.81);Muscle weakness  (generalized) (M62.81)   Activity Tolerance Patient limited by lethargy   Patient Left in bed;with call bell/phone within reach;with bed alarm set;with family/visitor present;with nursing/sitter in room   Nurse Communication Other (comment) (pts. wife present and requesting to speak with md for updates)        Time: 1201-1220 OT Time Calculation (min): 19 min  Charges: OT General Charges $OT Visit: 1 Visit OT Treatments $Therapeutic Activity: 8-22 mins  Sonia Baller, COTA/L Acute Rehabilitation [phone]    Tanya Nones 06/03/2021, 12:30 PM

## 2021-06-03 NOTE — Progress Notes (Signed)
I removed blood product refusal status as patient and wife confirmed this was not intentional and they do desire blood products if needed

## 2021-06-03 NOTE — Progress Notes (Signed)
PROGRESS NOTE        PATIENT DETAILS Name: Casey Greene Age: 70 y.o. Sex: male Date of Birth: Nov 12, 1951 Admit Date: 05/28/2021 Admitting Physician Amie Portland, MD SRP:RXYV, Lillette Boxer, MD  Brief Narrative: Patient is a 70 y.o. male with history of CVA, mild neurocognitive dysfunction, PAD, chronic diastolic heart failure, DM-2, HTN, CKD stage IV who presented with left-sided hemiplegia-found to have acute right thalamic hemorrhage.  Admitted by neurology to neurointensive care-upon stability-transferred to [facility].  See below for further details.  Significant events: 7/3>> presented with left-sided hemiplegia-right thalamic hemorrhage on CT-admit to neuro ICU 7/7>> transfer to [facility] 7/7>> febrile-Unasyn started  Significant studies: 6/12>> TTE: EF 85-92%, grade 2 diastolic dysfunction. 7/3>> acute right thalamic hemorrhage with intraventricular extension and hydrocephalus. 7/3>> CT head: Unchanged size of intraparenchymal hemorrhage 7/4>> MRI brain: 2 small acute infarct in rt parietal lobe, acute right thalamic hemorrhage with intraventricular extension. 7/4>> MRA brain: No significant stenosis or aneurysm 7/4>> MRA neck: No significant stenosis 7/4>> LDL: 50 7/4>> A1c: 5.1 7/7>> CXR: No pneumonia 7/8>> CXR: No pneumonia  Antimicrobial therapy: Unasyn: 7/7>>  Microbiology data: 7/7>> COVID/influenza PCR: Negative 7/7>> blood culture: No growth  Procedures : None  Consults: Neurology, neurosurgery, palliative care  DVT Prophylaxis : heparin injection 5,000 Units Start: 06/02/21 1515  SCDs  Subjective: More Awake/alert compared to yesterday-fever curve better-although continues to have fever. +cough-RN continues to bring up significant secretions on suctioning. No diarrhea-no Abd pain   Assessment/Plan: Right thalamic intracranial hemorrhage with intraventricular extension and hydrocephalus: Continues to have significant left  hemiplegia-dysphagia-on NG tube feedings.  Work-up as above.  Neurology following with recommendations to keep SBP less than 160.  Continue PT/OT/SLP evaluation  Dysphagia: Due to above-on NG tube feedings-accumulating secretions at times-at risk for aspiration pneumonia.  Long discussion with spouse at bedside on 7/7-she is aware that patient may require PEG tube-but understands that it will not prevent aspiration.  Have consulted palliative care to have goals of care discussion with family before we proceed with PEG tube placement (if no improvement in dysphagia). Reassess early next week regarding peg tube  Fever-suspicion for microaspiration: Remains febrile-although fever curve better-culture negative so far.  Continues to have cough-requires frequent suctioning per Conservation officer, historic buildings. No other source of infection apparent-CXR neg x 2-but suspect has PNA not seen on CXR. Given persistent fever-will go ahead and check CT Chest/Abd  HTN: BP relatively stable-goal SBP less than 160 per neurology-increase Coreg to 25 mg BID-continue hydralazine-but given CKD-potential for AKI-will stop Losartan.Follow and adjust  HLD: On statins prior to this hospital stay-resume when oral intake established.  HFpEF: Euvolemic on exam  Hypernatremia: We will increase free water flushes to 300 cc every 6-and recheck sodium levels in the morning.  CKD stage IV: Slight jump in creatinine overnight-but still not very far from baseline-watch closely.  Stop Losartan.  History of mild neurocognitive dysfunction: Resume Aricept when able.  Normocytic anemia: Due to CKD-no indication for transfusion-follow CBC periodically.  Nutrition Status: Nutrition Problem: Severe Malnutrition Etiology: chronic illness (Alzheimers dementia, DM, CKD, CHF) Signs/Symptoms: moderate fat depletion, severe fat depletion, moderate muscle depletion, severe muscle depletion, percent weight loss Percent weight loss: 23 % Interventions: Tube  feeding, MVI, Prostat    Diet: Diet Order     None        Code Status: Full code  Family Communication: Spouse-Marsha-[phone] spoke at bedside on 7/9  Disposition Plan: Status is: Inpatient  Remains inpatient appropriate because:Inpatient level of care appropriate due to severity of illness  Dispo: The patient is from: Home              Anticipated d/c is to: CIR              Patient currently is not medically stable to d/c.   Difficult to place patient No    Barriers to Discharge: ICH-severe dysphagia-n.p.o. with NG tube-not yet stable for discharge  Antimicrobial agents: Anti-infectives (From admission, onward)    Start     Dose/Rate Route Frequency Ordered Stop   06/01/21 1500  Ampicillin-Sulbactam (UNASYN) 3 g in sodium chloride 0.9 % 100 mL IVPB        3 g 200 mL/hr over 30 Minutes Intravenous Every 12 hours 06/01/21 1402          Time spent: 35 minutes-Greater than 50% of this time was spent in counseling, explanation of diagnosis, planning of further management, and coordination of care.  MEDICATIONS: Scheduled Meds:  carvedilol  25 mg Per NG tube BID WC   chlorhexidine  15 mL Mouth Rinse BID   Chlorhexidine Gluconate Cloth  6 each Topical Q0600   feeding supplement (PROSource TF)  45 mL Per Tube BID   free water  300 mL Per Tube Q6H   heparin injection (subcutaneous)  5,000 Units Subcutaneous Q8H   hydrALAZINE  100 mg Per Tube TID   mouth rinse  15 mL Mouth Rinse q12n4p   multivitamin with minerals  1 tablet Per Tube Daily   pantoprazole sodium  40 mg Per Tube QHS   senna-docusate  1 tablet Per Tube BID   sodium chloride flush  3 mL Intravenous Once   Continuous Infusions:  ampicillin-sulbactam (UNASYN) IV 3 g (06/03/21 0346)   feeding supplement (OSMOLITE 1.5 CAL) 1,000 mL (05/31/21 2144)   PRN Meds:.acetaminophen (TYLENOL) oral liquid 160 mg/5 mL, hydrALAZINE, labetalol   PHYSICAL EXAM: Vital signs: Vitals:   06/03/21 1131  06/03/21 1235 06/03/21 1303 06/03/21 1403  BP: (!) 163/107 (!) 158/93    Pulse: 99     Resp: 18     Temp: 99.6 F (37.6 C)  (!) 102.5 F (39.2 C) (!) 101.6 F (38.7 C)  TempSrc: Oral  Axillary Axillary  SpO2: 100%     Weight:      Height:       Filed Weights   05/28/21 1924 05/31/21 0500  Weight: 73.9 kg 71.4 kg   Body mass index is 22.59 kg/m.   Gen Exam:Alert awake-not in any distress HEENT:atraumatic, normocephalic Chest: B/L clear to auscultation anteriorly CVS:S1S2 regular Abdomen:soft non tender, non distended Extremities:no edema Neurology: Left sided deficits Skin: no rash   I have personally reviewed following labs and imaging studies  LABORATORY DATA: CBC: Recent Labs  Lab 05/28/21 1752 05/28/21 1758 06/01/21 0825 06/02/21 0259 06/03/21 0106  WBC 6.8  --  8.6 8.7 10.1  NEUTROABS 6.3  --   --   --   --   HGB 10.5* 11.2* 9.9* 9.1* 9.3*  HCT 31.9* 33.0* 30.2* 27.9* 28.6*  MCV 96.4  --  95.9 96.9 97.3  PLT 173  --  151 153 161     Basic Metabolic Panel: Recent Labs  Lab 05/28/21 1752 05/28/21 1758 05/30/21 1559 05/30/21 1559 05/30/21 1812 05/31/21 0458 05/31/21 1452 06/01/21 0825 06/02/21 0259 06/03/21 0106  NA 141 142  --   --   --   --   --  145 146* 148*  K 3.5 3.5  --   --   --   --   --  3.9 3.8 3.6  CL 103 105  --   --   --   --   --  111 113* 113*  CO2 28  --   --   --   --   --   --  27 23 27   GLUCOSE 119* 112*  --   --   --   --   --  123* 147* 143*  BUN 35* 34*  --   --   --   --   --  43* 55* 62*  CREATININE 2.20* 2.20*  --   --   --   --   --  2.11* 2.58* 2.56*  CALCIUM 10.8*  --   --   --   --   --   --  10.5* 10.8* 11.1*  MG  --   --  1.4*   < > 1.5* 1.7 1.5* 1.7 2.1  --   PHOS  --   --  2.7  --  2.6 2.1* 2.7  --   --   --    < > = values in this interval not displayed.     GFR: Estimated Creatinine Clearance: 26.3 mL/min (A) (by C-G formula based on SCr of 2.56 mg/dL (H)).  Liver Function Tests: Recent Labs  Lab  05/28/21 1752 05/29/21 0958 06/01/21 0825 06/02/21 0259 06/03/21 0106  AST 29 29 37 34 31  ALT 14 12 13 13 13   ALKPHOS 51 48 46 37* 38  BILITOT 1.1 0.7 0.6 0.8 0.7  PROT 7.6 7.2 7.0 6.8 7.0  ALBUMIN 4.2 3.7 3.3* 3.1* 3.1*    No results for input(s): LIPASE, AMYLASE in the last 168 hours. Recent Labs  Lab 05/29/21 0958  AMMONIA 17     Coagulation Profile: Recent Labs  Lab 05/28/21 1752  INR 1.1     Cardiac Enzymes: No results for input(s): CKTOTAL, CKMB, CKMBINDEX, TROPONINI in the last 168 hours.  BNP (last 3 results) Recent Labs    02/03/21 1100  PROBNP 635*     Lipid Profile: No results for input(s): CHOL, HDL, LDLCALC, TRIG, CHOLHDL, LDLDIRECT in the last 72 hours.  Thyroid Function Tests: No results for input(s): TSH, T4TOTAL, FREET4, T3FREE, THYROIDAB in the last 72 hours.  Anemia Panel: No results for input(s): VITAMINB12, FOLATE, FERRITIN, TIBC, IRON, RETICCTPCT in the last 72 hours.  Urine analysis:    Component Value Date/Time   COLORURINE YELLOW 06/01/2021 0710   APPEARANCEUR HAZY (A) 06/01/2021 0710   LABSPEC 1.013 06/01/2021 0710   PHURINE 7.0 06/01/2021 0710   GLUCOSEU NEGATIVE 06/01/2021 0710   GLUCOSEU NEGATIVE 03/28/2018 1206   HGBUR NEGATIVE 06/01/2021 0710   BILIRUBINUR NEGATIVE 06/01/2021 0710   KETONESUR NEGATIVE 06/01/2021 0710   PROTEINUR NEGATIVE 06/01/2021 0710   UROBILINOGEN 0.2 03/28/2018 1206   NITRITE NEGATIVE 06/01/2021 0710   LEUKOCYTESUR NEGATIVE 06/01/2021 0710    Sepsis Labs: Lactic Acid, Venous    Component Value Date/Time   LATICACIDVEN 1.1 05/05/2021 2200    MICROBIOLOGY: Recent Results (from the past 240 hour(s))  MRSA Next Gen by PCR, Nasal     Status: None   Collection Time: 05/28/21  8:13 PM   Specimen: Nasal Mucosa; Nasal Swab  Result Value Ref Range Status   MRSA by PCR Next Gen NOT DETECTED NOT DETECTED Final    Comment: (NOTE) The GeneXpert  MRSA Assay (FDA approved for NASAL specimens  only), is one component of a comprehensive MRSA colonization surveillance program. It is not intended to diagnose MRSA infection nor to guide or monitor treatment for MRSA infections. Test performance is not FDA approved in patients less than 63 years old. Performed at [facility], [place] [address], [place], [place] [address]   Culture, blood (routine x 2)     Status: None (Preliminary result)   Collection Time: 06/01/21  8:14 AM   Specimen: BLOOD  Result Value Ref Range Status   Specimen Description BLOOD RIGHT ANTECUBITAL  Final   Special Requests   Final    BOTTLES DRAWN AEROBIC AND ANAEROBIC Blood Culture adequate volume   Culture   Final    NO GROWTH 2 DAYS Performed at [facility], [place] [address] [place], [place] [address]    Report Status PENDING  Incomplete  Culture, blood (routine x 2)     Status: None (Preliminary result)   Collection Time: 06/01/21  8:31 AM   Specimen: BLOOD RIGHT HAND  Result Value Ref Range Status   Specimen Description BLOOD RIGHT HAND  Final   Special Requests   Final    BOTTLES DRAWN AEROBIC AND ANAEROBIC Blood Culture adequate volume   Culture   Final    NO GROWTH 2 DAYS Performed at [facility], [place] [address] [place], [place] [address]    Report Status PENDING  Incomplete  Resp Panel by RT-PCR (Flu A&B, Covid) Nasopharyngeal Swab     Status: None   Collection Time: 06/01/21  2:02 PM   Specimen: Nasopharyngeal Swab; Nasopharyngeal(NP) swabs in vial transport medium  Result Value Ref Range Status   SARS Coronavirus 2 by RT PCR NEGATIVE NEGATIVE Final    Comment: (NOTE) SARS-CoV-2 target nucleic acids are NOT DETECTED.  The SARS-CoV-2 RNA is generally detectable in upper respiratory specimens during the acute phase of infection. The lowest concentration of SARS-CoV-2 viral copies this assay can detect is 138 copies/mL. A negative result does not preclude SARS-Cov-2 infection and should not be used as the sole basis for  treatment or other patient management decisions. A negative result may occur with  improper specimen collection/handling, submission of specimen other than nasopharyngeal swab, presence of viral mutation(s) within the areas targeted by this assay, and inadequate number of viral copies(<138 copies/mL). A negative result must be combined with clinical observations, patient history, and epidemiological information. The expected result is Negative.  Fact Sheet for Patients:  EntrepreneurPulse.com.au  Fact Sheet for Healthcare Providers:  IncredibleEmployment.be  This test is no t yet approved or cleared by the [place] FDA and  has been authorized for detection and/or diagnosis of SARS-CoV-2 by FDA under an Emergency Use Authorization (EUA). This EUA will remain  in effect (meaning this test can be used) for the duration of the COVID-19 declaration under Section 564(b)(1) of the Act, 21 U.S.C.section 360bbb-3(b)(1), unless the authorization is terminated  or revoked sooner.       Influenza A by PCR NEGATIVE NEGATIVE Final   Influenza B by PCR NEGATIVE NEGATIVE Final    Comment: (NOTE) The Xpert Xpress SARS-CoV-2/FLU/RSV plus assay is intended as an aid in the diagnosis of influenza from Nasopharyngeal swab specimens and should not be used as a sole basis for treatment. Nasal washings and aspirates are unacceptable for Xpert Xpress SARS-CoV-2/FLU/RSV testing.  Fact Sheet for Patients: EntrepreneurPulse.com.au  Fact Sheet for Healthcare Providers: IncredibleEmployment.be  This test is  not yet approved or cleared by the [place] and has been authorized for detection and/or diagnosis of SARS-CoV-2 by FDA under an Emergency Use Authorization (EUA). This EUA will remain in effect (meaning this test can be used) for the duration of the COVID-19 declaration under Section 564(b)(1) of the Act, 21  U.S.C. section 360bbb-3(b)(1), unless the authorization is terminated or revoked.  Performed at [facility], [place] [address] [place], [place] [address]     RADIOLOGY STUDIES/RESULTS: CT HEAD WO CONTRAST  Result Date: 06/03/2021 CLINICAL DATA:  Follow-up cerebral hemorrhage EXAM: CT HEAD WITHOUT CONTRAST TECHNIQUE: Contiguous axial images were obtained from the base of the skull through the vertex without intravenous contrast. COMPARISON:  Head CT from 6 days ago FINDINGS: Brain: Subacute right thalamic hematoma which is decreased in bulk, now 2.3 cm maximal on axial slices. There is intraventricular clot in the lateral ventricles which has diminished, as has ventricular volume. No evidence of cortical infarct or extra-axial collection. Chronic small vessel ischemia in the deep white matter. Vascular: No hyperdense vessel or unexpected calcification. Skull: Normal. Negative for fracture or focal lesion. Sinuses/Orbits: Left cataract resection Other: Subcutaneous dermal inclusion cyst in the left suboccipital region. IMPRESSION: Decreasing right thalamic and intraventricular hemorrhage. Ventriculomegaly has resolved. Electronically Signed   By: Monte Fantasia M.D.   On: 06/03/2021 05:42   DG Chest Port 1 View  Result Date: 06/02/2021 CLINICAL DATA:  Short of breath EXAM: PORTABLE CHEST 1 VIEW COMPARISON:  None. FINDINGS: Feeding tube extends the stomach. Lungs are clear. No pneumonia, infiltrate pneumothorax IMPRESSION: Lungs are clear Electronically Signed   By: Suzy Bouchard M.D.   On: 06/02/2021 09:06     LOS: 6 days   Oren Binet, MD  [facility]    To contact the attending provider between 7A-7P or the covering provider during after hours 7P-7A, please log into the web site [email or web address] and access using universal [place] password for that web site. If you do not have the password, please call the hospital operator.  06/03/2021, 2:59 PM

## 2021-06-03 NOTE — Progress Notes (Signed)
STROKE TEAM PROGRESS NOTE   INTERVAL HISTORY Wife at bedside.  Patient lying in bed, lethargic, open eyes on voice and pain.  Still has fever this afternoon T-max 102.5.  Blood cultures no growth for 2 days. Patient still on unasyn.  Sodium 148, increase free water.  Creatinine stable.  Vitals:   06/03/21 1235 06/03/21 1303 06/03/21 1403 06/03/21 1519  BP: (!) 158/93   (!) 146/80  Pulse:    76  Resp:    20  Temp:  (!) 102.5 F (39.2 C) (!) 101.6 F (38.7 C) 99.8 F (37.7 C)  TempSrc:  Axillary Axillary Oral  SpO2:    100%  Weight:      Height:       CBC:  Recent Labs  Lab 05/28/21 1752 05/28/21 1758 06/02/21 0259 06/03/21 0106  WBC 6.8   < > 8.7 10.1  NEUTROABS 6.3  --   --   --   HGB 10.5*   < > 9.1* 9.3*  HCT 31.9*   < > 27.9* 28.6*  MCV 96.4   < > 96.9 97.3  PLT 173   < > 153 161   < > = values in this interval not displayed.   Basic Metabolic Panel:  Recent Labs  Lab 05/31/21 0458 05/31/21 1452 06/01/21 0825 06/02/21 0259 06/03/21 0106  NA  --   --  145 146* 148*  K  --   --  3.9 3.8 3.6  CL  --   --  111 113* 113*  CO2  --   --  27 23 27   GLUCOSE  --   --  123* 147* 143*  BUN  --   --  43* 55* 62*  CREATININE  --   --  2.11* 2.58* 2.56*  CALCIUM  --   --  10.5* 10.8* 11.1*  MG 1.7 1.5* 1.7 2.1  --   PHOS 2.1* 2.7  --   --   --    Lipid Panel:  Recent Labs  Lab 05/29/21 0224  CHOL 114  TRIG 48  HDL 54  CHOLHDL 2.1  VLDL 10  LDLCALC 50   HgbA1c:  Recent Labs  Lab 05/29/21 0224  HGBA1C 5.1    IMAGING past 24 hours CT HEAD WO CONTRAST  Result Date: 06/03/2021 CLINICAL DATA:  Follow-up cerebral hemorrhage EXAM: CT HEAD WITHOUT CONTRAST TECHNIQUE: Contiguous axial images were obtained from the base of the skull through the vertex without intravenous contrast. COMPARISON:  Head CT from 6 days ago FINDINGS: Brain: Subacute right thalamic hematoma which is decreased in bulk, now 2.3 cm maximal on axial slices. There is intraventricular clot in the  lateral ventricles which has diminished, as has ventricular volume. No evidence of cortical infarct or extra-axial collection. Chronic small vessel ischemia in the deep white matter. Vascular: No hyperdense vessel or unexpected calcification. Skull: Normal. Negative for fracture or focal lesion. Sinuses/Orbits: Left cataract resection Other: Subcutaneous dermal inclusion cyst in the left suboccipital region. IMPRESSION: Decreasing right thalamic and intraventricular hemorrhage. Ventriculomegaly has resolved. Electronically Signed   By: Monte Fantasia M.D.   On: 06/03/2021 05:42    PHYSICAL EXAM General: Appears well-developed elderly African-American male, not in distress Psych: Affect appropriate to situation Eyes: No scleral injection HENT: No OP obstrucion Head: Normocephalic.  Cardiovascular: Normal rate and regular rhythm.  Respiratory: Mild Cheyne-Stokes breathing pattern GI: Soft.  No distension. There is no tenderness.  Skin: WDI   Neurological Examination Patient lethargic, drowsy sleepy but  able to arouse with voice and follow limited simple commands.  Orientated to place and age, not to time.  Paucity of speech, able to name 1/2 and repeat simple sentences.  Right gaze complete, left gaze incomplete, left facial droop, visual field full.  Right upper extremity 4/5, left upper extremity finger grip 2/5, bicep 3/5, proximal 1/5.  Left lower extremity 1/5, right lower extremity 3 -/5.  Sensation subjectively symmetrical.  Right finger-to-nose slow but grossly intact.   ASSESSMENT/PLAN Casey Greene is a 70 y.o. male with history of previous strokes, HTN, HLD, mild neurocognitive disorder, PVD, chronic diastolic heart failure who presented to the ED as a code stroke for evaluation of acute left upper and lower extremity weakness; noticed by family to be dragging his left foot while assisting him to bed. Initial ICH score: 1  ICH:  Right thalamic ICH with IVH secondary to HTN, with  mild cytotoxic edema and obstructive hydrocephalus Code Stroke CT head show ICH/IVH as above MRI  acute right thalamic ICH with surrounding edema, IVH extension, mild effacement of 3rd ventricale, stable mild hydrocephalus. Noted 2 other small foci of DWI in right parietal lobe. Evidence of numerous old strokes and microhemorrhages that appear HTN as etiology. MRA head and neck no significant change or stenosis CT repeat 06/03/2021 decreasing right thalamic and intraventricular hemorrhage. Ventriculomegaly has resolved. 2D Echo 05/07/21 - EF 65-70% EF, mild LVH, severe dilation of LA LDL 50 HgbA1c 5.1 VTE prophylaxis - heparin subq ASA 81mg  + clopidogrel 75 mg daily prior to admission, now on none d/t ICH Therapy recommendations:  CIR Disposition:  pending  Hypertensive emergency Home meds:  Coreg, apresoline, cozaar Off Clevaprex gtt Now on coreg, hydralazine, losartan Long-term BP goal normotensive  Hyperlipidemia Home meds:  Lipitor 40mg  LDL 50, goal < 70 Continue home statin at discharge  Fever  Tmax 102.1->100.8->102.5 COVID negative UA negative blood cultures no growth so far.  CXR clear Likely aspiration, patient currently on unasyn Management per primary team  Dysphagia  Hyponatremia/dehydration Did not pass swallow On TF via cortrak @ 55 On FW 200 Q6->300Q6 Speech on board  Other Stroke Risk Factors Advanced Age >/= 21  Former Cigarette smoker Hx stroke/TIA on imaging Family hx stroke  Diastolic Congestive heart failure PVD  Other Active Problems CKD3b, Cre 2.11->2.58-> 2.56 Chronic anemia due to CKD Mild neurocognitive impairment - Started Aricept in May H/o prostate ca-s/p proctectomy and with chronic urinary retention  Hospital day # 6  Neurology will sign off. Please call with questions. Pt will follow up with [name] at [facility] on 06/19/21 and [place] PA at [facility] on 10/06/21. Thanks for the consult.   Rosalin Hawking, MD PhD Stroke  Neurology 06/03/2021 3:38 PM    To contact Stroke Continuity provider, please refer to [email or web address] After hours, contact General Neurology

## 2021-06-04 DIAGNOSIS — N179 Acute kidney failure, unspecified: Secondary | ICD-10-CM

## 2021-06-04 DIAGNOSIS — T17908A Unspecified foreign body in respiratory tract, part unspecified causing other injury, initial encounter: Secondary | ICD-10-CM | POA: Diagnosis not present

## 2021-06-04 DIAGNOSIS — Z515 Encounter for palliative care: Secondary | ICD-10-CM

## 2021-06-04 DIAGNOSIS — E43 Unspecified severe protein-calorie malnutrition: Secondary | ICD-10-CM | POA: Diagnosis not present

## 2021-06-04 DIAGNOSIS — E785 Hyperlipidemia, unspecified: Secondary | ICD-10-CM | POA: Diagnosis not present

## 2021-06-04 DIAGNOSIS — N183 Chronic kidney disease, stage 3 unspecified: Secondary | ICD-10-CM

## 2021-06-04 DIAGNOSIS — I610 Nontraumatic intracerebral hemorrhage in hemisphere, subcortical: Secondary | ICD-10-CM | POA: Diagnosis not present

## 2021-06-04 LAB — GLUCOSE, CAPILLARY
Glucose-Capillary: 139 mg/dL — ABNORMAL HIGH (ref 70–99)
Glucose-Capillary: 141 mg/dL — ABNORMAL HIGH (ref 70–99)
Glucose-Capillary: 146 mg/dL — ABNORMAL HIGH (ref 70–99)
Glucose-Capillary: 147 mg/dL — ABNORMAL HIGH (ref 70–99)
Glucose-Capillary: 147 mg/dL — ABNORMAL HIGH (ref 70–99)

## 2021-06-04 LAB — URINALYSIS, ROUTINE W REFLEX MICROSCOPIC
Bilirubin Urine: NEGATIVE
Glucose, UA: NEGATIVE mg/dL
Hgb urine dipstick: NEGATIVE
Ketones, ur: NEGATIVE mg/dL
Leukocytes,Ua: NEGATIVE
Nitrite: NEGATIVE
Protein, ur: NEGATIVE mg/dL
Specific Gravity, Urine: 1.017 (ref 1.005–1.030)
pH: 5 (ref 5.0–8.0)

## 2021-06-04 LAB — COMPREHENSIVE METABOLIC PANEL
ALT: 12 U/L (ref 0–44)
AST: 28 U/L (ref 15–41)
Albumin: 3 g/dL — ABNORMAL LOW (ref 3.5–5.0)
Alkaline Phosphatase: 39 U/L (ref 38–126)
Anion gap: 10 (ref 5–15)
BUN: 67 mg/dL — ABNORMAL HIGH (ref 8–23)
CO2: 26 mmol/L (ref 22–32)
Calcium: 11.5 mg/dL — ABNORMAL HIGH (ref 8.9–10.3)
Chloride: 113 mmol/L — ABNORMAL HIGH (ref 98–111)
Creatinine, Ser: 2.47 mg/dL — ABNORMAL HIGH (ref 0.61–1.24)
GFR, Estimated: 27 mL/min — ABNORMAL LOW (ref >60)
Glucose, Bld: 149 mg/dL — ABNORMAL HIGH (ref 70–99)
Potassium: 3.7 mmol/L (ref 3.5–5.1)
Sodium: 149 mmol/L — ABNORMAL HIGH (ref 135–145)
Total Bilirubin: 0.9 mg/dL (ref 0.3–1.2)
Total Protein: 6.8 g/dL (ref 6.5–8.1)

## 2021-06-04 LAB — PROCALCITONIN: Procalcitonin: 0.14 ng/mL

## 2021-06-04 LAB — CBC
HCT: 28.7 % — ABNORMAL LOW (ref 39.0–52.0)
Hemoglobin: 9.3 g/dL — ABNORMAL LOW (ref 13.0–17.0)
MCH: 31.6 pg (ref 26.0–34.0)
MCHC: 32.4 g/dL (ref 30.0–36.0)
MCV: 97.6 fL (ref 80.0–100.0)
Platelets: 169 10*3/uL (ref 150–400)
RBC: 2.94 MIL/uL — ABNORMAL LOW (ref 4.22–5.81)
RDW: 13.5 % (ref 11.5–15.5)
WBC: 11.1 10*3/uL — ABNORMAL HIGH (ref 4.0–10.5)
nRBC: 0 % (ref 0.0–0.2)

## 2021-06-04 MED ORDER — FREE WATER
400.0000 mL | Freq: Four times a day (QID) | Status: DC
  Administered 2021-06-04 – 2021-06-14 (×37): 400 mL

## 2021-06-04 NOTE — Consult Note (Addendum)
Consultation Note Date: 06/04/2021   Patient Name: Casey Greene  DOB: [birth year]  MRN: [id number]  Age / Sex: 70 y.o., male  PCP: [name], MD Referring Physician: Jonetta Osgood, MD  Reason for Consultation: Establishing goals of care  HPI/Patient Profile: 70 y.o. male with past medical history of multiple CVAs, DM, HFpEF (G2DD), PVD, prostate CA, CKD 3, MDD, mild neurocognitive disorder, and severe malnutrition, who was admitted on 05/28/2021 with left sided weakness.  He was found to have an intercerebral hemorrage in the right thalamic area with intraventricular extension and hydrocephalus.  Neurology recommendations have been made.  He is on aspirin and plavix and being considered for CIR.  A cor trak was placed for nutrition.  On 7/7 the patient developed a fever. Blood cultures are still pending but his very wet vocal quality and thick secretions give rise to suspicion for aspiration pneumonia.  I have spoken directly with the Neurologist who feels that he may regain a limited amount of lost functionality on the left, but it will take 2-3 months before we will really know whether or not he will regain the ability to swallow.   Of course this would be significantly hampered by recurrent aspiration pneumonia.  Clinical Assessment and Goals of Care: I have reviewed medical records including EPIC notes, labs and imaging, received report from RN, assessed the patient and then met at the bedside along with his wife [name].  [name] included their two children in the meeting by speaker phone to discuss diagnosis prognosis, [place], EOL wishes, disposition and options.  I introduced Palliative Medicine as specialized medical care for people living with serious illness. It focuses on providing relief from the symptoms and stress of a serious illness. The goal is to improve quality of life for both the patient and  the family.  We discussed a brief life review of the patient and then focused on their current illness. The natural disease trajectory and expectations at EOL were discussed.  [name] and Brewer are both from [place].  They met at a party and were married when his was 28 and she was 1.  Jaymian played college football - he was a Pharmacist, hospital at [employer] - and still loves sports.  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  He and [name] have two adult children.  Their son lives in the home with them.  [name] shares that Junah and Lynann Bologna (son) have an uneasy relationship with some unresolved differences.  [name] and Denzel moved from [place] to [place] so that Jameire could go to [employer].  They stayed in [place] (their new environment) so he would not go back to his old friend and old ways he left in [place].  The plan worked and Edmon has been a different man (drug free) since they have been in [place].  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  She cared for both of her parents at EOL at home with Hospice.  We talked about Mr. Moraes's medical  co-morbidities including the 3 previous strokes, likely vascular dementia, diastolic HF, and stage 3B chronic kidney disease.   [name] tells me that Mr. Heyward has been seeing Dr. Carolin Sicks for his kidneys.  [name] shares with me that Haneef has been to the hospital 3 times in 6 months.  When they determined that he needed medical care - Kemuel told [name] he did not want to come to the hospital again.     I attempted to elicit values and goals of care important to the patient.  [name] presented [employer].  His paperwork indicated that he wants all measures taken to prolong life.  He even wants artificial hydration and nutrition at end of life.   These choices are not what I normally see in an AD so I asked [name] what drove him to make these choices.  [name] explained that Willim's mother did not have a directive in place when one was needed - so  he very deliberately put a directive in place for himself to state his desires.    I shared that in my mind there are situations worse than death including - utter dependence, bed sores, uncontrollable pain, recurrent pneumonia and debilitation.  Marsha listened and nodded.  I asked is there any situation in which she would do something different that what his directive requests?  [name] said "no".     The difference between aggressive medical intervention and comfort care was considered in light of the patient's goals of care.  [name] and I talked about PEG placement, recurrent aspiration, facility placement.  She is making plans for all of these steps.  Questions and concerns were addressed.  The family was encouraged to call with questions or concerns.  PMT will continue to support holistically.    Primary Decision Maker:  HCPOA.  HCPOA is a combination of his wife and daughter.    SUMMARY OF RECOMMENDATIONS    Full code, full scope until end of life. Move forward with PEG.   Family will accept all offered medical interventions to prolong life. Will request thorough oral care to help prevent future aspiration pneumonia  Code Status/Advance Care Planning: Full code   Symptom Management:  Patient is comfortable, but has an involuntary right arm tremor.  he has significant secretions and cough  Additional Recommendations (Limitations, Scope, Preferences): Full Scope Treatment  Palliative Prophylaxis:  Aspiration and Delirium Protocol  Psycho-social/Spiritual:  Desire for further Chaplaincy support:  Prognosis:    Discharge Planning: [place] for rehab with Palliative care service follow-up      Primary Diagnoses: Present on Admission:  ICH (intracerebral hemorrhage) ([facility])   I have reviewed the medical record, interviewed the patient and family, and examined the patient. The following aspects are pertinent.  Past Medical History:  Diagnosis Date    Anemia of chronic disease 03/28/2018   Ascending aorta dilation 07/29/2019   (a) Echo 07/29/19 1m   Bleeding per rectum 12/09/2018   CKD (chronic kidney disease) stage 3, GFR 30-59 ml/min ([facility]) 10/13/2014   Discussed with patient importance of good BP control, avoiding nephrotoxic meds/supplements in preventing further progression   Constipation 12/01/2018   Corn or callus 06/11/2019   Decreased appetite    Diastolic congestive heart failure ([facility] 02/03/2021   DOE (dyspnea on exertion) 06/17/2020   Dysrhythmia    Edema 07/09/2016   Elevated hemoglobin A1c 06/11/2019   Essential hypertension 06/09/2014   Overview:  metoprolol caused bradycardia amlodipine causes headache, leg swelling  Last Assessment &  Plan:  We had a long discussion regarding goals of treatment of HTN to prevent worsening cardiac and renal disease.  We also discussed options to change his regimen.  He states that he would prefer to seek a practitioner who can recommend herbal remedies as he feels thhis will be a healthier path f   Hepatitis C virus infection cured after antiviral drug therapy 07/05/2014   History of gout    History of urinary frequency 12/05/2020   Hyperlipidemia 06/29/2014   Hypokalemia 12/09/2018   Laryngeal mass 06/17/2020   Localized osteoarthritis of left knee 06/11/2020   Left knee injection-06/10/2020   LVH (left ventricular hypertrophy) due to hypertensive disease    (a) echo 07/29/19 severe concentric LVH   Major depressive disorder 02/27/2016   Mass of thyroid region 03/28/2018   Mild neurocognitive disorder due to multiple etiologies 12/30/2020   Nonsustained ventricular tachycardia 07/23/2019   (a) Zio monitor 06/2019. Subsequent echo with severe concentric LVH, but normal EF 55-60%   Onychomycosis 12/01/2013   PAC (premature atrial contraction)    (a) ZIO 06/2019. Asymptomatic   Prostate cancer 1997   Dx in 1997, never treated, taking herbal supplements. S/p prostatectomy 2017   PVD  (peripheral vascular disease) 12/01/2013   Stroke    Per 12/26/20 MRI - remote pontine and bilateral thalamic infarcts    Urinary retention    Social History   Socioeconomic History   Marital status: Married    Spouse name: Not on file   Number of children: Not on file   Years of education: 15   Highest education level: Some college, no degree  Occupational History   Occupation: retired  Tobacco Use   Smoking status: Former    Packs/day: 0.50    Years: 10.00    Pack years: 5.00    Types: Cigarettes    Quit date: 01/11/2006    Years since quitting: 15.4   Smokeless tobacco: Never  Vaping Use   Vaping Use: Never used  Substance and Sexual Activity   Alcohol use: No   Drug use: Not Currently    Types: Cocaine, Marijuana    Comment: hx of cocaine abuse; stopped approximately 12 years prior   Sexual activity: Not Currently  Other Topics Concern   Not on file  Social History Narrative   Right handed    Lives with family    Social Determinants of Health   Financial Resource Strain: Low Risk    Difficulty of Paying Living Expenses: Not hard at all  Food Insecurity: No Food Insecurity   Worried About Charity fundraiser in the Last Year: Never true   [place] in the Last Year: Never true  Transportation Needs: No Transportation Needs   Lack of Transportation (Medical): No   Lack of Transportation (Non-Medical): No  Physical Activity: Insufficiently Active   Days of Exercise per Week: 7 days   Minutes of Exercise per Session: 20 min  Stress: No Stress Concern Present   Feeling of Stress : Not at all  Social Connections: Moderately Integrated   Frequency of Communication with Friends and Family: More than three times a week   Frequency of Social Gatherings with Friends and Family: Once a week   Attends Religious Services: 1 to 4 times per year   Active Member of [employer] or Organizations: No   Attends Archivist Meetings: Never   Marital Status: Married    Family History  Problem Relation Age of Onset  Heart disease Mother    Obesity Mother    Cervical cancer Mother    Dementia Mother        unspecified type; symptom onset in late 60s/early 82s   Heart disease Father    Diabetes Father    Stroke Father    ALS Sister    Stroke Paternal Grandmother    Stroke Paternal Grandfather    Colon cancer Neg Hx    Esophageal cancer Neg Hx    Colon polyps Neg Hx    Rectal cancer Neg Hx    Stomach cancer Neg Hx     Allergies  Allergen Reactions   Amlodipine Other (See Comments)    headache   Tamsulosin Hcl       Vital Signs: BP (!) 152/100 (BP Location: Right Arm)   Pulse 81   Temp 98.3 F (36.8 C) (Oral)   Resp 16   Ht _0  (1.778 m)   Wt 71.4 kg   SpO2 98%   BMI 22.59 kg/m  Pain Scale: 0-10   Pain Score: 0-No pain   SpO2: SpO2: 98 % O2 Device:SpO2: 98 % O2 Flow Rate: .     Palliative Assessment/Data: 20%     Time In: 1:00 Time Out: 2:00 Time Total: 60 min. Visit consisted of counseling and education dealing with the complex and emotionally intense issues surrounding the need for palliative care and symptom management in the setting of serious and potentially life-threatening illness. Greater than 50%  of this time was spent counseling and coordinating care related to the above assessment and plan.  Signed by: Florentina Jenny, [name] Palliative Medicine  Please contact Palliative Medicine Team phone at [phone] for questions and concerns.  For individual provider: See Shea Evans

## 2021-06-04 NOTE — Progress Notes (Signed)
PROGRESS NOTE        PATIENT DETAILS Name: Casey Greene Age: 70 y.o. Sex: male Date of Birth: [birth year] Admit Date: 05/28/2021 Admitting Physician Amie Portland, MD XAJ:OINO, Lillette Boxer, MD  Brief Narrative: Patient is a 70 y.o. male with history of CVA, mild neurocognitive dysfunction, PAD, chronic diastolic heart failure, DM-2, HTN, CKD stage IV who presented with left-sided hemiplegia-found to have acute right thalamic hemorrhage.  Admitted by neurology to neurointensive care-upon stability-transferred to [facility].  See below for further details.  Significant events: 7/3>> presented with left-sided hemiplegia-right thalamic hemorrhage on CT-admit to neuro ICU 7/7>> transfer to [facility] 7/7>> febrile-Unasyn started  Significant studies: 6/12>> TTE: EF 67-67%, grade 2 diastolic dysfunction. 7/3>> acute right thalamic hemorrhage with intraventricular extension and hydrocephalus. 7/3>> CT head: Unchanged size of intraparenchymal hemorrhage 7/4>> MRI brain: 2 small acute infarct in rt parietal lobe, acute right thalamic hemorrhage with intraventricular extension. 7/4>> MRA brain: No significant stenosis or aneurysm 7/4>> MRA neck: No significant stenosis 7/4>> LDL: 50 7/4>> A1c: 5.1 7/7>> CXR: No pneumonia 7/8>> CXR: No pneumonia  Antimicrobial therapy: Unasyn: 7/7>>  Microbiology data: 7/7>> COVID/influenza PCR: Negative 7/7>> blood culture: No growth  Procedures : None  Consults: Neurology, neurosurgery, palliative care  DVT Prophylaxis : heparin injection 5,000 Units Start: 06/02/21 1515  SCDs  Subjective: Somewhat lethargic but awake and alert.  Afebrile this morning.  Fever curve slowly improving.  No diarrhea.  Had 1 episode of some blood in the stools yesterday-none since then.  Assessment/Plan: Right thalamic intracranial hemorrhage with intraventricular extension and hydrocephalus: Continues to have dysphagia-left-sided hemiplegia-keep SBP less  than 160-continue PT/OT and SLP evaluation.    Dysphagia: Currently on NG tube feedings-no signs of improvement as of now-discussions ongoing with family regarding whether or not they desire PEG tube placement.  Palliative care to reach out to family today.    Fever-suspicion for microaspiration: Accumulating secretions-high suspicion for aspiration pneumonia-on Unasyn-fever curve definitely better today.  Cultures negative so far.  CT chest shows secretions in the airway without any major parenchymal infection.  We will plan on 5-7 days of Unasyn depending on clinical response.  No other source of infection apparent.  1 episode of hematochezia on 7/9: CBC stable-none since then-plan is to watch closely for recurrence.  HTN: BP relatively stable-goal SBP less than 160-stopped losartan due to CKD-at risk of AKI-continue Coreg/hydralazine.  Follow-add other agents if needed.    HLD: On statins prior to this hospital stay-resume when oral intake established.  HFpEF: Euvolemic on exam  Hypernatremia: Continues to have hyponatremia-increasing free water flushes to 400 cc every 6 hours-recheck electrolytes in the morning.    CKD stage IV: Slight jump in creatinine overnight-but still not very far from baseline-watch closely.  No longer on losartan.  History of mild neurocognitive dysfunction: Resume Aricept when able.  Normocytic anemia: Due to CKD-no indication for transfusion-follow CBC periodically.  Nutrition Status: Nutrition Problem: Severe Malnutrition Etiology: chronic illness (Alzheimers dementia, DM, CKD, CHF) Signs/Symptoms: moderate fat depletion, severe fat depletion, moderate muscle depletion, severe muscle depletion, percent weight loss Percent weight loss: 23 % Interventions: Tube feeding, MVI, Prostat    Diet: Diet Order     None        Code Status: Full code   Family Communication: [phone] spoke at bedside on 7/9  Disposition Plan: Status  is: Inpatient  Remains inpatient appropriate because:Inpatient level of care appropriate due to severity of illness  Dispo: The patient is from: Home              Anticipated d/c is to: CIR              Patient currently is not medically stable to d/c.   Difficult to place patient No    Barriers to Discharge: ICH-severe dysphagia-n.p.o. with NG tube-not yet stable for discharge  Antimicrobial agents: Anti-infectives (From admission, onward)    Start     Dose/Rate Route Frequency Ordered Stop   06/01/21 1500  Ampicillin-Sulbactam (UNASYN) 3 g in sodium chloride 0.9 % 100 mL IVPB        3 g 200 mL/hr over 30 Minutes Intravenous Every 12 hours 06/01/21 1402          Time spent: 35 minutes-Greater than 50% of this time was spent in counseling, explanation of diagnosis, planning of further management, and coordination of care.  MEDICATIONS: Scheduled Meds:  carvedilol  25 mg Per NG tube BID WC   chlorhexidine  15 mL Mouth Rinse BID   Chlorhexidine Gluconate Cloth  6 each Topical Q0600   feeding supplement (PROSource TF)  45 mL Per Tube BID   free water  400 mL Per Tube Q6H   heparin injection (subcutaneous)  5,000 Units Subcutaneous Q8H   hydrALAZINE  100 mg Per Tube TID   mouth rinse  15 mL Mouth Rinse q12n4p   multivitamin with minerals  1 tablet Per Tube Daily   pantoprazole sodium  40 mg Per Tube QHS   senna-docusate  1 tablet Per Tube BID   sodium chloride flush  3 mL Intravenous Once   Continuous Infusions:  ampicillin-sulbactam (UNASYN) IV 3 g (06/04/21 0414)   feeding supplement (OSMOLITE 1.5 CAL) 1,000 mL (05/31/21 2144)   PRN Meds:.acetaminophen (TYLENOL) oral liquid 160 mg/5 mL, hydrALAZINE, labetalol   PHYSICAL EXAM: Vital signs: Vitals:   06/04/21 0000 06/04/21 0400 06/04/21 0757 06/04/21 0907  BP: (!) 151/79 (!) 161/84 (!) 177/96 (!) 152/100  Pulse:   81   Resp: 19 16 16    Temp: 100.2 F (37.9 C) (!) 100.6 F (38.1 C) 98.3 F (36.8 C)   TempSrc:  Temporal Temporal Oral   SpO2: 99% 98%    Weight:      Height:       Filed Weights   05/28/21 1924 05/31/21 0500  Weight: 73.9 kg 71.4 kg   Body mass index is 22.59 kg/m.   Gen Exam: Alert-slight lethargic-answering questions appropriately. HEENT:atraumatic, normocephalic Chest: B/L clear to auscultation anteriorly CVS:S1S2 regular Abdomen:soft non tender, non distended Extremities:no edema Neurology: Left hemiplegia.  Skin: no rash   I have personally reviewed following labs and imaging studies  LABORATORY DATA: CBC: Recent Labs  Lab 05/28/21 1752 05/28/21 1758 06/01/21 0825 06/02/21 0259 06/03/21 0106 06/03/21 1946 06/04/21 0241  WBC 6.8  --  8.6 8.7 10.1 10.5 11.1*  NEUTROABS 6.3  --   --   --   --   --   --   HGB 10.5*   < > 9.9* 9.1* 9.3* 9.5* 9.3*  HCT 31.9*   < > 30.2* 27.9* 28.6* 29.5* 28.7*  MCV 96.4  --  95.9 96.9 97.3 98.3 97.6  PLT 173  --  151 153 161 167 169   < > = values in this interval not displayed.     Basic Metabolic Panel: Recent Labs  Lab 05/28/21  1752 05/28/21 1758 05/30/21 1559 05/30/21 1559 05/30/21 1812 05/31/21 0458 05/31/21 1452 06/01/21 0825 06/02/21 0259 06/03/21 0106 06/04/21 0241  NA 141 142  --   --   --   --   --  145 146* 148* 149*  K 3.5 3.5  --   --   --   --   --  3.9 3.8 3.6 3.7  CL 103 105  --   --   --   --   --  111 113* 113* 113*  CO2 28  --   --   --   --   --   --  27 23 27 26   GLUCOSE 119* 112*  --   --   --   --   --  123* 147* 143* 149*  BUN 35* 34*  --   --   --   --   --  43* 55* 62* 67*  CREATININE 2.20* 2.20*  --   --   --   --   --  2.11* 2.58* 2.56* 2.47*  CALCIUM 10.8*  --   --   --   --   --   --  10.5* 10.8* 11.1* 11.5*  MG  --   --  1.4*   < > 1.5* 1.7 1.5* 1.7 2.1  --   --   PHOS  --   --  2.7  --  2.6 2.1* 2.7  --   --   --   --    < > = values in this interval not displayed.     GFR: Estimated Creatinine Clearance: 27.3 mL/min (A) (by C-G formula based on SCr of 2.47 mg/dL  (H)).  Liver Function Tests: Recent Labs  Lab 05/29/21 0958 06/01/21 0825 06/02/21 0259 06/03/21 0106 06/04/21 0241  AST 29 37 34 31 28  ALT 12 13 13 13 12   ALKPHOS 48 46 37* 38 39  BILITOT 0.7 0.6 0.8 0.7 0.9  PROT 7.2 7.0 6.8 7.0 6.8  ALBUMIN 3.7 3.3* 3.1* 3.1* 3.0*    No results for input(s): LIPASE, AMYLASE in the last 168 hours. Recent Labs  Lab 05/29/21 0958  AMMONIA 17     Coagulation Profile: Recent Labs  Lab 05/28/21 1752  INR 1.1     Cardiac Enzymes: No results for input(s): CKTOTAL, CKMB, CKMBINDEX, TROPONINI in the last 168 hours.  BNP (last 3 results) Recent Labs    02/03/21 1100  PROBNP 635*     Lipid Profile: No results for input(s): CHOL, HDL, LDLCALC, TRIG, CHOLHDL, LDLDIRECT in the last 72 hours.  Thyroid Function Tests: No results for input(s): TSH, T4TOTAL, FREET4, T3FREE, THYROIDAB in the last 72 hours.  Anemia Panel: No results for input(s): VITAMINB12, FOLATE, FERRITIN, TIBC, IRON, RETICCTPCT in the last 72 hours.  Urine analysis:    Component Value Date/Time   COLORURINE YELLOW 06/01/2021 0710   APPEARANCEUR HAZY (A) 06/01/2021 0710   LABSPEC 1.013 06/01/2021 0710   PHURINE 7.0 06/01/2021 0710   GLUCOSEU NEGATIVE 06/01/2021 0710   GLUCOSEU NEGATIVE 03/28/2018 1206   HGBUR NEGATIVE 06/01/2021 0710   BILIRUBINUR NEGATIVE 06/01/2021 0710   KETONESUR NEGATIVE 06/01/2021 0710   PROTEINUR NEGATIVE 06/01/2021 0710   UROBILINOGEN 0.2 03/28/2018 1206   NITRITE NEGATIVE 06/01/2021 0710   LEUKOCYTESUR NEGATIVE 06/01/2021 0710    Sepsis Labs: Lactic Acid, Venous    Component Value Date/Time   LATICACIDVEN 1.1 05/05/2021 2200    MICROBIOLOGY: Recent Results (from the past 240  hour(s))  MRSA Next Gen by PCR, Nasal     Status: None   Collection Time: 05/28/21  8:13 PM   Specimen: Nasal Mucosa; Nasal Swab  Result Value Ref Range Status   MRSA by PCR Next Gen NOT DETECTED NOT DETECTED Final    Comment: (NOTE) The GeneXpert  MRSA Assay (FDA approved for NASAL specimens only), is one component of a comprehensive MRSA colonization surveillance program. It is not intended to diagnose MRSA infection nor to guide or monitor treatment for MRSA infections. Test performance is not FDA approved in patients less than 34 years old. Performed at [facility], [place] [address], [place], [place] [address]   Culture, blood (routine x 2)     Status: None (Preliminary result)   Collection Time: 06/01/21  8:14 AM   Specimen: BLOOD  Result Value Ref Range Status   Specimen Description BLOOD RIGHT ANTECUBITAL  Final   Special Requests   Final    BOTTLES DRAWN AEROBIC AND ANAEROBIC Blood Culture adequate volume   Culture   Final    NO GROWTH 3 DAYS Performed at [facility], [place] [address] [place], [place] [address]    Report Status PENDING  Incomplete  Culture, blood (routine x 2)     Status: None (Preliminary result)   Collection Time: 06/01/21  8:31 AM   Specimen: BLOOD RIGHT HAND  Result Value Ref Range Status   Specimen Description BLOOD RIGHT HAND  Final   Special Requests   Final    BOTTLES DRAWN AEROBIC AND ANAEROBIC Blood Culture adequate volume   Culture   Final    NO GROWTH 3 DAYS Performed at [facility], [place] [address], [place], [place] [address]    Report Status PENDING  Incomplete  Resp Panel by RT-PCR (Flu A&B, Covid) Nasopharyngeal Swab     Status: None   Collection Time: 06/01/21  2:02 PM   Specimen: Nasopharyngeal Swab; Nasopharyngeal(NP) swabs in vial transport medium  Result Value Ref Range Status   SARS Coronavirus 2 by RT PCR NEGATIVE NEGATIVE Final    Comment: (NOTE) SARS-CoV-2 target nucleic acids are NOT DETECTED.  The SARS-CoV-2 RNA is generally detectable in upper respiratory specimens during the acute phase of infection. The lowest concentration of SARS-CoV-2 viral copies this assay can detect is 138 copies/mL. A negative result does not preclude SARS-Cov-2 infection  and should not be used as the sole basis for treatment or other patient management decisions. A negative result may occur with  improper specimen collection/handling, submission of specimen other than nasopharyngeal swab, presence of viral mutation(s) within the areas targeted by this assay, and inadequate number of viral copies(<138 copies/mL). A negative result must be combined with clinical observations, patient history, and epidemiological information. The expected result is Negative.  Fact Sheet for Patients:  EntrepreneurPulse.com.au  Fact Sheet for Healthcare Providers:  IncredibleEmployment.be  This test is no t yet approved or cleared by the [place] FDA and  has been authorized for detection and/or diagnosis of SARS-CoV-2 by FDA under an Emergency Use Authorization (EUA). This EUA will remain  in effect (meaning this test can be used) for the duration of the COVID-19 declaration under Section 564(b)(1) of the Act, 21 U.S.C.section 360bbb-3(b)(1), unless the authorization is terminated  or revoked sooner.       Influenza A by PCR NEGATIVE NEGATIVE Final   Influenza B by PCR NEGATIVE NEGATIVE Final    Comment: (NOTE) The Xpert Xpress SARS-CoV-2/FLU/RSV plus  assay is intended as an aid in the diagnosis of influenza from Nasopharyngeal swab specimens and should not be used as a sole basis for treatment. Nasal washings and aspirates are unacceptable for Xpert Xpress SARS-CoV-2/FLU/RSV testing.  Fact Sheet for Patients: EntrepreneurPulse.com.au  Fact Sheet for Healthcare Providers: IncredibleEmployment.be  This test is not yet approved or cleared by the [place] FDA and has been authorized for detection and/or diagnosis of SARS-CoV-2 by FDA under an Emergency Use Authorization (EUA). This EUA will remain in effect (meaning this test can be used) for the duration of the COVID-19 declaration  under Section 564(b)(1) of the Act, 21 U.S.C. section 360bbb-3(b)(1), unless the authorization is terminated or revoked.  Performed at [facility], [place] [address], [place], [place] [address]     RADIOLOGY STUDIES/RESULTS: CT ABDOMEN PELVIS WO CONTRAST  Result Date: 06/03/2021 CLINICAL DATA:  Fever of unknown origin. 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 EXAM: CT CHEST, ABDOMEN AND PELVIS WITHOUT CONTRAST TECHNIQUE: Multidetector CT imaging of the chest, abdomen and pelvis was performed following the standard protocol without IV contrast. COMPARISON:  Chest radiograph 06/02/2021, PET CT 10/25/2020 FINDINGS: CT CHEST FINDINGS Cardiovascular: Assessment limited in the absence of contrast media. Hypoattenuation of the cardiac blood pool compatible with mild anemia. Few scattered coronary artery calcifications. Normal cardiac size. No significant pericardial effusion. Atherosclerotic plaque within the normal caliber aorta. No hyperdense mural thickening or plaque displacement. No periaortic stranding. Shared origin of the brachiocephalic and left common carotid arteries. Tortuosity of the proximal great vessels. Central pulmonary arteries are normal caliber. Mediastinum/Nodes: No mediastinal fluid or gas. Normal thyroid gland and thoracic inlet. Scattered secretions noted within the central trachea and right mainstem bronchus. Transesophageal feeding tube in place with the tip positioned appropriately towards the gastric body/antrum. No acute abnormality of the thoracic esophagus. No discernible laryngeal abnormality within the partially included cervical soft tissues. No worrisome mediastinal or axillary adenopathy. Hilar nodal evaluation is limited in the absence of intravenous contrast media. Lungs/Pleura: No concerning pulmonary nodules or masses. Few scattered bandlike opacities in the lung bases, likely atelectasis or scarring. No consolidation, features of edema,  pneumothorax, or effusion. Musculoskeletal: No acute osseous abnormality or suspicious osseous lesion. Minimal degenerative changes in the spine and shoulders. CT ABDOMEN PELVIS FINDINGS Hepatobiliary: No worrisome focal liver lesions. Smooth liver surface contour. Normal hepatic attenuation. Normal gallbladder and biliary tree. No visible calcified gallstone. Mild bilateral gynecomastia. No worrisome chest wall mass. Pancreas: No pancreatic ductal dilatation or surrounding inflammatory changes. Spleen: Normal in size. No concerning splenic lesions. Adrenals/Urinary Tract: Normal adrenals. Bilateral fluid attenuation parapelvic renal cysts. No concerning focal renal lesion. Mild cortical thinning and scarring bilaterally. Nonspecific bilateral perinephric stranding. Mild left urothelial thickening. No hydronephrosis. Mild circumferential bladder wall thickening. Stomach/Bowel: Transesophageal feeding tube, as above. Stomach and duodenum are otherwise unremarkable. No small bowel thickening or dilatation. Fluid-filled appearance of the colon with lack of formed stool, may reflect a rapid transit state. Scattered colonic diverticula without focal inflammation to suggest diverticulitis. Redemonstration of the ventral midline hernia containing portion of colon. No evidence of mechanical or vascular compromise of the bowel at this time. No other sites of bowel obstruction. Appendix is not visualized. No focal inflammation the vicinity of the cecum to suggest an occult appendicitis. Vascular/Lymphatic: Atherosclerotic calcifications within the abdominal aorta and branch vessels. No aneurysm or ectasia. No enlarged abdominopelvic lymph nodes. Reproductive: Surgical changes noted at the prostate bed. No gross abnormality at the site of prior  prostatectomy. Chronic presacral fat stranding. Other: Complex multilobulated ventral hernia containing portion of the transverse colon. Some stranding of the herniated fat contents  both within and external to the hernia sac could reflect some fatty strangulation (7/105). Correlate for point tenderness. No evidence of vascular compromise or mechanical obstruction of the herniated bowel loops. No other bowel containing hernia. Mild body wall edema most pronounced towards the flanks, left greater than right. No free abdominopelvic air or fluid. Chronic stranding in the presacral space. Musculoskeletal: The osseous structures appear diffusely demineralized which may limit detection of small or nondisplaced fractures. No acute osseous abnormality or suspicious osseous lesion. Multilevel degenerative changes are present in the imaged portions of the spine. Additional mild degenerative changes in the bilateral hips. IMPRESSION: 1. Scattered secretions noted in the central trachea and right mainstem bronchus. Lungs remain otherwise clear. 2. Diffusely fluid-filled appearance of the colon with lack of formed stool. Could reflect diarrheal illness/rapid transit state. 3. Bilateral perinephric stranding with some urothelial thickening on the left and circumferential bladder wall thickening. Could reflect a cystitis and ascending tract infection in the appropriate clinical setting. Correlate with urinalysis. 4. Complex multilobulated ventral hernia containing portion of the transverse colon. No evidence of vascular compromise or bowel obstruction though some stranding of the herniated fat contents could reflect fat strangulation. Correlate for point tenderness. 5. Surgical changes from prior prostatectomy. No gross interval change from comparison PET-CT. 6. Coronary artery calcifications. 7. Hypoattenuation of the blood pool suggestive of anemia. Correlate with CBC. 8.  Aortic Atherosclerosis (ICD10-I70.0). Electronically Signed   By: Lovena Le M.D.   On: 06/03/2021 20:36   CT HEAD WO CONTRAST  Result Date: 06/03/2021 CLINICAL DATA:  Follow-up cerebral hemorrhage EXAM: CT HEAD WITHOUT CONTRAST  TECHNIQUE: Contiguous axial images were obtained from the base of the skull through the vertex without intravenous contrast. COMPARISON:  Head CT from 6 days ago FINDINGS: Brain: Subacute right thalamic hematoma which is decreased in bulk, now 2.3 cm maximal on axial slices. There is intraventricular clot in the lateral ventricles which has diminished, as has ventricular volume. No evidence of cortical infarct or extra-axial collection. Chronic small vessel ischemia in the deep white matter. Vascular: No hyperdense vessel or unexpected calcification. Skull: Normal. Negative for fracture or focal lesion. Sinuses/Orbits: Left cataract resection Other: Subcutaneous dermal inclusion cyst in the left suboccipital region. IMPRESSION: Decreasing right thalamic and intraventricular hemorrhage. Ventriculomegaly has resolved. Electronically Signed   By: Monte Fantasia M.D.   On: 06/03/2021 05:42   CT CHEST WO CONTRAST  Result Date: 06/03/2021 CLINICAL DATA:  Fever of unknown origin. 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 EXAM: CT CHEST, ABDOMEN AND PELVIS WITHOUT CONTRAST TECHNIQUE: Multidetector CT imaging of the chest, abdomen and pelvis was performed following the standard protocol without IV contrast. COMPARISON:  Chest radiograph 06/02/2021, PET CT 10/25/2020 FINDINGS: CT CHEST FINDINGS Cardiovascular: Assessment limited in the absence of contrast media. Hypoattenuation of the cardiac blood pool compatible with mild anemia. Few scattered coronary artery calcifications. Normal cardiac size. No significant pericardial effusion. Atherosclerotic plaque within the normal caliber aorta. No hyperdense mural thickening or plaque displacement. No periaortic stranding. Shared origin of the brachiocephalic and left common carotid arteries. Tortuosity of the proximal great vessels. Central pulmonary arteries are normal caliber. Mediastinum/Nodes: No mediastinal fluid or gas. Normal thyroid gland and thoracic  inlet. Scattered secretions noted within the central trachea and right mainstem bronchus. Transesophageal feeding tube in place with the tip positioned appropriately towards the gastric  body/antrum. No acute abnormality of the thoracic esophagus. No discernible laryngeal abnormality within the partially included cervical soft tissues. No worrisome mediastinal or axillary adenopathy. Hilar nodal evaluation is limited in the absence of intravenous contrast media. Lungs/Pleura: No concerning pulmonary nodules or masses. Few scattered bandlike opacities in the lung bases, likely atelectasis or scarring. No consolidation, features of edema, pneumothorax, or effusion. Musculoskeletal: No acute osseous abnormality or suspicious osseous lesion. Minimal degenerative changes in the spine and shoulders. CT ABDOMEN PELVIS FINDINGS Hepatobiliary: No worrisome focal liver lesions. Smooth liver surface contour. Normal hepatic attenuation. Normal gallbladder and biliary tree. No visible calcified gallstone. Mild bilateral gynecomastia. No worrisome chest wall mass. Pancreas: No pancreatic ductal dilatation or surrounding inflammatory changes. Spleen: Normal in size. No concerning splenic lesions. Adrenals/Urinary Tract: Normal adrenals. Bilateral fluid attenuation parapelvic renal cysts. No concerning focal renal lesion. Mild cortical thinning and scarring bilaterally. Nonspecific bilateral perinephric stranding. Mild left urothelial thickening. No hydronephrosis. Mild circumferential bladder wall thickening. Stomach/Bowel: Transesophageal feeding tube, as above. Stomach and duodenum are otherwise unremarkable. No small bowel thickening or dilatation. Fluid-filled appearance of the colon with lack of formed stool, may reflect a rapid transit state. Scattered colonic diverticula without focal inflammation to suggest diverticulitis. Redemonstration of the ventral midline hernia containing portion of colon. No evidence of mechanical  or vascular compromise of the bowel at this time. No other sites of bowel obstruction. Appendix is not visualized. No focal inflammation the vicinity of the cecum to suggest an occult appendicitis. Vascular/Lymphatic: Atherosclerotic calcifications within the abdominal aorta and branch vessels. No aneurysm or ectasia. No enlarged abdominopelvic lymph nodes. Reproductive: Surgical changes noted at the prostate bed. No gross abnormality at the site of prior prostatectomy. Chronic presacral fat stranding. Other: Complex multilobulated ventral hernia containing portion of the transverse colon. Some stranding of the herniated fat contents both within and external to the hernia sac could reflect some fatty strangulation (7/105). Correlate for point tenderness. No evidence of vascular compromise or mechanical obstruction of the herniated bowel loops. No other bowel containing hernia. Mild body wall edema most pronounced towards the flanks, left greater than right. No free abdominopelvic air or fluid. Chronic stranding in the presacral space. Musculoskeletal: The osseous structures appear diffusely demineralized which may limit detection of small or nondisplaced fractures. No acute osseous abnormality or suspicious osseous lesion. Multilevel degenerative changes are present in the imaged portions of the spine. Additional mild degenerative changes in the bilateral hips. IMPRESSION: 1. Scattered secretions noted in the central trachea and right mainstem bronchus. Lungs remain otherwise clear. 2. Diffusely fluid-filled appearance of the colon with lack of formed stool. Could reflect diarrheal illness/rapid transit state. 3. Bilateral perinephric stranding with some urothelial thickening on the left and circumferential bladder wall thickening. Could reflect a cystitis and ascending tract infection in the appropriate clinical setting. Correlate with urinalysis. 4. Complex multilobulated ventral hernia containing portion of the  transverse colon. No evidence of vascular compromise or bowel obstruction though some stranding of the herniated fat contents could reflect fat strangulation. Correlate for point tenderness. 5. Surgical changes from prior prostatectomy. No gross interval change from comparison PET-CT. 6. Coronary artery calcifications. 7. Hypoattenuation of the blood pool suggestive of anemia. Correlate with CBC. 8.  Aortic Atherosclerosis (ICD10-I70.0). Electronically Signed   By: Lovena Le M.D.   On: 06/03/2021 20:36     LOS: 7 days   Oren Binet, MD  [facility]    To contact the attending provider between 7A-7P or the covering  provider during after hours 7P-7A, please log into the web site [email or web address] and access using universal [place] password for that web site. If you do not have the password, please call the hospital operator.  06/04/2021, 11:31 AM

## 2021-06-05 ENCOUNTER — Ambulatory Visit: Payer: Medicare Other | Admitting: Internal Medicine

## 2021-06-05 DIAGNOSIS — E785 Hyperlipidemia, unspecified: Secondary | ICD-10-CM | POA: Diagnosis not present

## 2021-06-05 DIAGNOSIS — I619 Nontraumatic intracerebral hemorrhage, unspecified: Secondary | ICD-10-CM

## 2021-06-05 DIAGNOSIS — E43 Unspecified severe protein-calorie malnutrition: Secondary | ICD-10-CM | POA: Diagnosis not present

## 2021-06-05 LAB — GLUCOSE, CAPILLARY
Glucose-Capillary: 136 mg/dL — ABNORMAL HIGH (ref 70–99)
Glucose-Capillary: 145 mg/dL — ABNORMAL HIGH (ref 70–99)
Glucose-Capillary: 145 mg/dL — ABNORMAL HIGH (ref 70–99)
Glucose-Capillary: 130 mg/dL — ABNORMAL HIGH (ref 70–99)
Glucose-Capillary: 140 mg/dL — ABNORMAL HIGH (ref 70–99)
Glucose-Capillary: 146 mg/dL — ABNORMAL HIGH (ref 70–99)

## 2021-06-05 LAB — COMPREHENSIVE METABOLIC PANEL
ALT: 11 U/L (ref 0–44)
AST: 23 U/L (ref 15–41)
Albumin: 2.7 g/dL — ABNORMAL LOW (ref 3.5–5.0)
Alkaline Phosphatase: 31 U/L — ABNORMAL LOW (ref 38–126)
Anion gap: 10 (ref 5–15)
BUN: 83 mg/dL — ABNORMAL HIGH (ref 8–23)
CO2: 27 mmol/L (ref 22–32)
Calcium: 11.2 mg/dL — ABNORMAL HIGH (ref 8.9–10.3)
Chloride: 109 mmol/L (ref 98–111)
Creatinine, Ser: 2.98 mg/dL — ABNORMAL HIGH (ref 0.61–1.24)
GFR, Estimated: 22 mL/min — ABNORMAL LOW (ref >60)
Glucose, Bld: 162 mg/dL — ABNORMAL HIGH (ref 70–99)
Potassium: 3.7 mmol/L (ref 3.5–5.1)
Sodium: 146 mmol/L — ABNORMAL HIGH (ref 135–145)
Total Bilirubin: 0.5 mg/dL (ref 0.3–1.2)
Total Protein: 6.2 g/dL — ABNORMAL LOW (ref 6.5–8.1)

## 2021-06-05 LAB — CBC
HCT: 26.9 % — ABNORMAL LOW (ref 39.0–52.0)
Hemoglobin: 8.5 g/dL — ABNORMAL LOW (ref 13.0–17.0)
MCH: 31.5 pg (ref 26.0–34.0)
MCHC: 31.6 g/dL (ref 30.0–36.0)
MCV: 99.6 fL (ref 80.0–100.0)
Platelets: 161 10*3/uL (ref 150–400)
RBC: 2.7 MIL/uL — ABNORMAL LOW (ref 4.22–5.81)
RDW: 13.3 % (ref 11.5–15.5)
WBC: 8.7 10*3/uL (ref 4.0–10.5)
nRBC: 0 % (ref 0.0–0.2)

## 2021-06-05 MED ORDER — SODIUM CHLORIDE 0.9 % IV SOLN: INTRAVENOUS | Status: AC

## 2021-06-05 NOTE — Consult Note (Signed)
[facility] for Infectious Diseases                                                                                        Patient Identification: Patient Name: Casey Greene MRN: [id number] [place] Date: 05/28/2021  5:29 PM Today's Date: 06/05/2021 Reason for consult:  Requesting provider:   Active Problems:   ICH (intracerebral hemorrhage) ([place])   Protein-calorie malnutrition, severe   Antibiotics: Ampsulbactam 7/7-current   Lines/Tubes: External Urinary Catheter, Feeding tube   Assessment Fevers DD- aspiration, central vs others Low suspicion for drug related fevers No reported diarrhea per RN  Less likely UTI given unremarkable UA although CT wuth non specific bilateral perinephric stranding  with some urothelial thickening and circumferential bladder wall thickening  Blood cx 7/7 No growth   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 Please call with questions or concerns.  Thank you for the consult  Rosiland Oz, MD Infectious Disease Physician [facility] for Infectious Disease [address] [place], [place] [address] Phone: [phone]  Fax: [phone]  __________________________________________________________________________________________________________ HPI and Hospital Course: 70 year old male with multiple comorbidities including CKD, CHF, hypertension, hepatitis C treated, hyperlipidemia, prostate cancer status post prostatectomy and extended bilateral pelvic lymphadenectomy in 2017, PVD, laryngeal mass, mild  neurocognitive disorder , CVA who presented to the ED with left sided weakness as a code stroke.  History is obtained from chart review given patient's current condition. Found to have acute right thalamic hemorrhage w right ventricular extension and hydrocephalus in the setting of uncontrolled hypertension.  Not a surgical candidate per neurosurgery.  MRI brain with 2 small acute infarcts of the inferior right parietal lobe.   At ED,  he was afebrile, no leukocytosis.  Blood cultures 7/7 2 out of 2 sets no growth.  UA unremarkable. Started having fevers after 3 days of admit. Started on Unasyn for concerns of aspiration vs central fevers. Palliative care has been consulted for [place]. ID consulted for fevers.    ROS: unavailable at this time  Past Medical History:  Diagnosis Date   Anemia of chronic disease 03/28/2018   Ascending aorta dilation 07/29/2019   (a) Echo 07/29/19 15mm   Bleeding per rectum 12/09/2018   CKD (chronic kidney disease) stage 3, GFR 30-59 ml/min ([place]) 10/13/2014   Discussed with patient importance of good BP control, avoiding nephrotoxic meds/supplements in preventing further progression   Constipation 12/01/2018   Corn or callus 06/11/2019   Decreased appetite    Diastolic congestive heart failure ([place]) 02/03/2021   DOE (dyspnea on exertion) 06/17/2020   Dysrhythmia    Edema 07/09/2016   Elevated hemoglobin A1c 06/11/2019   Essential hypertension 06/09/2014   Overview:  metoprolol caused bradycardia amlodipine causes headache, leg swelling  Last Assessment & Plan:  We had a long discussion regarding goals of treatment of HTN to prevent worsening cardiac and renal disease.  We also discussed options to change his regimen.  He states that he would prefer to seek a practitioner who can recommend herbal remedies as he feels thhis will be a healthier path f   Hepatitis C virus infection cured after antiviral drug therapy 07/05/2014   History of gout    History of urinary  frequency 12/05/2020   Hyperlipidemia 06/29/2014   Hypokalemia 12/09/2018   Laryngeal mass 06/17/2020   Localized osteoarthritis of left knee 06/11/2020   Left knee injection-06/10/2020   LVH (left ventricular hypertrophy) due to hypertensive disease    (a) echo 07/29/19 severe concentric LVH   Major depressive disorder 02/27/2016   Mass of thyroid region 03/28/2018   Mild neurocognitive disorder due to multiple etiologies 12/30/2020   Nonsustained ventricular tachycardia 07/23/2019   (a) Zio monitor 06/2019. Subsequent echo with severe concentric LVH, but normal EF 55-60%   Onychomycosis 12/01/2013   PAC (premature atrial contraction)    (a) ZIO 06/2019. Asymptomatic   Prostate cancer 1997   Dx in 1997, never treated, taking herbal supplements. S/p prostatectomy 2017   PVD (peripheral vascular disease) 12/01/2013   Stroke    Per 12/26/20 MRI - remote pontine and bilateral thalamic infarcts    Urinary retention    Past Surgical History:  Procedure Laterality Date   ACHILLES TENDON REPAIR     CERVICAL DISC SURGERY     neck   COLONOSCOPY     before 2010    LYMPHADENECTOMY Bilateral 01/26/2016   Procedure: EXTENDED BILATERAL PELVIC LYMPHADENECTOMY;  Surgeon: Raynelle Bring, MD;  Location: [facility];  Service: Urology;  Laterality: Bilateral;   NOSE SURGERY     in high school-nasal fracturex2   ROBOT ASSISTED LAPAROSCOPIC RADICAL PROSTATECTOMY N/A 01/26/2016   Procedure: XI ROBOTIC ASSISTED LAPAROSCOPIC RADICAL PROSTATECTOMY LEVEL 3;  Surgeon: Raynelle Bring, MD;  Location: [facility];  Service: Urology;  Laterality: N/A;     Scheduled Meds:  carvedilol  25 mg Per NG tube BID WC   chlorhexidine  15 mL Mouth Rinse BID   Chlorhexidine Gluconate Cloth  6 each Topical Q0600   feeding supplement (PROSource TF)  45 mL Per Tube BID   free water  400 mL Per Tube Q6H   heparin injection (subcutaneous)  5,000 Units Subcutaneous Q8H   hydrALAZINE  100 mg Per Tube TID   mouth rinse  15 mL Mouth Rinse q12n4p    multivitamin with minerals  1 tablet Per Tube Daily   pantoprazole sodium  40 mg Per Tube QHS   senna-docusate  1 tablet Per Tube BID   sodium chloride flush  3 mL Intravenous Once   Continuous Infusions:  sodium chloride 75 mL/hr at 06/05/21 0851   ampicillin-sulbactam (UNASYN) IV 3 g (06/05/21 0436)   feeding supplement (OSMOLITE 1.5 CAL) 1,000 mL (06/05/21 0238)   PRN Meds:.acetaminophen (TYLENOL) oral liquid 160 mg/5 mL, hydrALAZINE, labetalol  Allergies  Allergen Reactions   Amlodipine Other (See Comments)    headache   Tamsulosin Hcl    Social History   Socioeconomic History   Marital status: Married    Spouse name: Not on file   Number of children: Not on file   Years of education: 15   Highest education level: Some college, no degree  Occupational History   Occupation: retired  Tobacco Use   Smoking status: Former    Packs/day: 0.50  Years: 10.00    Pack years: 5.00    Types: Cigarettes    Quit date: 01/11/2006    Years since quitting: 15.4   Smokeless tobacco: Never  Vaping Use   Vaping Use: Never used  Substance and Sexual Activity   Alcohol use: No   Drug use: Not Currently    Types: Cocaine, Marijuana    Comment: hx of cocaine abuse; stopped approximately 12 years prior   Sexual activity: Not Currently  Other Topics Concern   Not on file  Social History Narrative   Right handed    Lives with family    Social Determinants of Health   Financial Resource Strain: Low Risk    Difficulty of Paying Living Expenses: Not hard at all  Food Insecurity: No Food Insecurity   Worried About Charity fundraiser in the Last Year: Never true   Ran Out of Food in the Last Year: Never true  Transportation Needs: No Transportation Needs   Lack of Transportation (Medical): No   Lack of Transportation (Non-Medical): No  Physical Activity: Insufficiently Active   Days of Exercise per Week: 7 days   Minutes of Exercise per Session: 20 min  Stress: No Stress  Concern Present   Feeling of Stress : Not at all  Social Connections: Moderately Integrated   Frequency of Communication with Friends and Family: More than three times a week   Frequency of Social Gatherings with Friends and Family: Once a week   Attends Religious Services: 1 to 4 times per year   Active Member of [employer] or Organizations: No   Attends Music therapist: Never   Marital Status: Married  Human resources officer Violence: Not At Risk   Fear of Current or Ex-Partner: No   Emotionally Abused: No   Physically Abused: No   Sexually Abused: No    Family History  Problem Relation Age of Onset   Heart disease Mother    Obesity Mother    Cervical cancer Mother    Dementia Mother        unspecified type; symptom onset in late 60s/early 7s   Heart disease Father    Diabetes Father    Stroke Father    ALS Sister    Stroke Paternal Grandmother    Stroke Paternal Grandfather    Colon cancer Neg Hx    Esophageal cancer Neg Hx    Colon polyps Neg Hx    Rectal cancer Neg Hx    Stomach cancer Neg Hx     Vitals BP 115/68 (BP Location: Right Arm)   Pulse 75   Temp 98.6 F (37 C) (Oral)   Resp 18   Ht 5\' 10"  (1.778 m)   Wt 71.4 kg   SpO2 100%   BMI 22.59 kg/m    Physical Exam Constitutional:  Elderly male lying in bed, left sided facial droop.     Comments:   Cardiovascular:     Rate and Rhythm: Normal rate and regular rhythm.     Heart sounds:   Pulmonary:     Effort: Pulmonary effort is normal.     Comments:   Abdominal:     Palpations: Abdomen is soft.     Tenderness: ventral hernia, soft , non tender and non distended   Musculoskeletal:        General: No swelling or tenderness in peripheral joints   Skin:    Comments: No lesions or rashes   Neurological:     General:  Left sided weakness  Psychiatric:        Mood and Affect: unable to assess.   Pertinent Microbiology Results for orders placed or performed during the hospital encounter  of 05/28/21  MRSA Next Gen by PCR, Nasal     Status: None   Collection Time: 05/28/21  8:13 PM   Specimen: Nasal Mucosa; Nasal Swab  Result Value Ref Range Status   MRSA by PCR Next Gen NOT DETECTED NOT DETECTED Final    Comment: (NOTE) The GeneXpert MRSA Assay (FDA approved for NASAL specimens only), is one component of a comprehensive MRSA colonization surveillance program. It is not intended to diagnose MRSA infection nor to guide or monitor treatment for MRSA infections. Test performance is not FDA approved in patients less than 71 years old. Performed at [facility], [place] [address], [place], [place] [address]   Culture, blood (routine x 2)     Status: None (Preliminary result)   Collection Time: 06/01/21  8:14 AM   Specimen: BLOOD  Result Value Ref Range Status   Specimen Description BLOOD RIGHT ANTECUBITAL  Final   Special Requests   Final    BOTTLES DRAWN AEROBIC AND ANAEROBIC Blood Culture adequate volume   Culture   Final    NO GROWTH 4 DAYS Performed at [facility], [place] [address] [place], [place] [address]    Report Status PENDING  Incomplete  Culture, blood (routine x 2)     Status: None (Preliminary result)   Collection Time: 06/01/21  8:31 AM   Specimen: BLOOD RIGHT HAND  Result Value Ref Range Status   Specimen Description BLOOD RIGHT HAND  Final   Special Requests   Final    BOTTLES DRAWN AEROBIC AND ANAEROBIC Blood Culture adequate volume   Culture   Final    NO GROWTH 4 DAYS Performed at [facility], [place] [address] [place], [place] [address]    Report Status PENDING  Incomplete  Resp Panel by RT-PCR (Flu A&B, Covid) Nasopharyngeal Swab     Status: None   Collection Time: 06/01/21  2:02 PM   Specimen: Nasopharyngeal Swab; Nasopharyngeal(NP) swabs in vial transport medium  Result Value Ref Range Status   SARS Coronavirus 2 by RT PCR NEGATIVE NEGATIVE Final    Comment: (NOTE) SARS-CoV-2 target nucleic acids are NOT DETECTED.  The  SARS-CoV-2 RNA is generally detectable in upper respiratory specimens during the acute phase of infection. The lowest concentration of SARS-CoV-2 viral copies this assay can detect is 138 copies/mL. A negative result does not preclude SARS-Cov-2 infection and should not be used as the sole basis for treatment or other patient management decisions. A negative result may occur with  improper specimen collection/handling, submission of specimen other than nasopharyngeal swab, presence of viral mutation(s) within the areas targeted by this assay, and inadequate number of viral copies(<138 copies/mL). A negative result must be combined with clinical observations, patient history, and epidemiological information. The expected result is Negative.  Fact Sheet for Patients:  EntrepreneurPulse.com.au  Fact Sheet for Healthcare Providers:  IncredibleEmployment.be  This test is no t yet approved or cleared by the [place] FDA and  has been authorized for detection and/or diagnosis of SARS-CoV-2 by FDA under an Emergency Use Authorization (EUA). This EUA will remain  in effect (meaning this test can be used) for the duration of the COVID-19 declaration under Section 564(b)(1) of the Act, 21 U.S.C.section 360bbb-3(b)(1), unless the authorization is terminated  or revoked  sooner.       Influenza A by PCR NEGATIVE NEGATIVE Final   Influenza B by PCR NEGATIVE NEGATIVE Final    Comment: (NOTE) The Xpert Xpress SARS-CoV-2/FLU/RSV plus assay is intended as an aid in the diagnosis of influenza from Nasopharyngeal swab specimens and should not be used as a sole basis for treatment. Nasal washings and aspirates are unacceptable for Xpert Xpress SARS-CoV-2/FLU/RSV testing.  Fact Sheet for Patients: EntrepreneurPulse.com.au  Fact Sheet for Healthcare Providers: IncredibleEmployment.be  This test is not yet approved or  cleared by the [place] FDA and has been authorized for detection and/or diagnosis of SARS-CoV-2 by FDA under an Emergency Use Authorization (EUA). This EUA will remain in effect (meaning this test can be used) for the duration of the COVID-19 declaration under Section 564(b)(1) of the Act, 21 U.S.C. section 360bbb-3(b)(1), unless the authorization is terminated or revoked.  Performed at [facility], [place] [address], [place], [place] [address]       Pertinent Lab seen by me: CBC Latest Ref Rng & Units 06/05/2021 06/04/2021 06/03/2021  WBC 4.0 - 10.5 K/uL 8.7 11.1(H) 10.5  Hemoglobin 13.0 - 17.0 g/dL 8.5(L) 9.3(L) 9.5(L)  Hematocrit 39.0 - 52.0 % 26.9(L) 28.7(L) 29.5(L)  Platelets 150 - 400 K/uL 161 169 167   CMP Latest Ref Rng & Units 06/05/2021 06/04/2021 06/03/2021  Glucose 70 - 99 mg/dL 162(H) 149(H) 143(H)  BUN 8 - 23 mg/dL 83(H) 67(H) 62(H)  Creatinine 0.61 - 1.24 mg/dL 2.98(H) 2.47(H) 2.56(H)  Sodium 135 - 145 mmol/L 146(H) 149(H) 148(H)  Potassium 3.5 - 5.1 mmol/L 3.7 3.7 3.6  Chloride 98 - 111 mmol/L 109 113(H) 113(H)  CO2 22 - 32 mmol/L 27 26 27   Calcium 8.9 - 10.3 mg/dL 11.2(H) 11.5(H) 11.1(H)  Total Protein 6.5 - 8.1 g/dL 6.2(L) 6.8 7.0  Total Bilirubin 0.3 - 1.2 mg/dL 0.5 0.9 0.7  Alkaline Phos 38 - 126 U/L 31(L) 39 38  AST 15 - 41 U/L 23 28 31   ALT 0 - 44 U/L 11 12 13      Pertinent Imagings/Other Imagings Plain films and CT images have been personally visualized and interpreted; radiology reports have been reviewed. Decision making incorporated into the Impression / Recommendations.  CT Chest abdomen pelvis 06/03/2021 IMPRESSION: 1. Scattered secretions noted in the central trachea and right mainstem bronchus. Lungs remain otherwise clear. 2. Diffusely fluid-filled appearance of the colon with lack of formed stool. Could reflect diarrheal illness/rapid transit state. 3. Bilateral perinephric stranding with some urothelial thickening on the left and  circumferential bladder wall thickening. Could reflect a cystitis and ascending tract infection in the appropriate clinical setting. Correlate with urinalysis. 4. Complex multilobulated ventral hernia containing portion of the transverse colon. No evidence of vascular compromise or bowel obstruction though some stranding of the herniated fat contents could reflect fat strangulation. Correlate for point tenderness. 5. Surgical changes from prior prostatectomy. No gross interval change from comparison PET-CT. 6. Coronary artery calcifications. 7. Hypoattenuation of the blood pool suggestive of anemia. Correlate with CBC. 8.  Aortic Atherosclerosis (ICD10-I70.0).  CT head 06/03/21 IMPRESSION: Decreasing right thalamic and intraventricular hemorrhage. Ventriculomegaly has resolved.  05/29/21 MRA HEAD   Intracranial internal carotid arteries are patent. Middle and anterior cerebral arteries are patent. Intracranial vertebral arteries, basilar artery, posterior cerebral arteries are patent. Bilateral posterior communicating arteries are present. There is no significant stenosis or aneurysm.   MRA NECK   Common, internal, and external carotid arteries are patent. Codominant vertebral arteries  are patent. There is focal moderate narrowing of the right V2 vertebral artery due to osteophytic spurring. No hemodynamically significant stenosis.   IMPRESSION: Two small acute infarcts of the inferior right parietal lobe.   Acute right thalamic hemorrhage with intraventricular extension, mild mass effect, and hydrocephalus similar to recent CT imaging. Likely hypertensive in etiology with multiple prior chronic microhemorrhage is noted.  CT head 05/28/21 IMPRESSION: 1. Unchanged size of intraparenchymal hematoma centered in the right thalamus with intraventricular extension. 2. Unchanged mild communicating hydrocephalus.  CT head code stroke 05/28/21 IMPRESSION: Acute right thalamic  hemorrhage with intraventricular extension and hydrocephalus. Likely hypertensive etiology, noting findings on prior brain MRI.  TTE 05/07/21 1. Left ventricular ejection fraction, by estimation, is 65 to 70%. The left ventricle has normal function. The left ventricle has no regional wall motion abnormalities. There is mild concentric left ventricular hypertrophy. Left ventricular diastolic parameters are consistent with Grade II diastolic dysfunction (pseudonormalization). Elevated left ventricular enddiastolic pressure. 2. Right ventricular systolic function is normal. The right ventricular size is normal. Tricuspid regurgitation signal is inadequate for assessing PA pressure. 3. Left atrial size was severely dilated. 4. There is a small bright target on the anterior MV leaflet tip that likely represents calcific MV disease. . The mitral valve is degenerative. Mild mitral valve regurgitation. No evidence of mitral stenosis. 5. The aortic valve is tricuspid. Aortic valve regurgitation is not visualized. Mild aortic valve sclerosis is present, with no evidence of aortic valve stenosis. 6. The inferior vena cava is dilated in size with >50% respiratory variability, suggesting right atrial pressure of 8 mmHg.   I spent more than 70  minutes for this patient encounter including review of prior medical records/discussing diagnostics and treatment plan with the patient/family/coordinate care with primary/other specialits with greater than 50% of time in face to face encounter.   Electronically signed by:   Rosiland Oz, MD Infectious Disease Physician [facility] for Infectious Disease Pager: [phone]

## 2021-06-05 NOTE — Progress Notes (Signed)
  Speech Language Pathology Treatment: Dysphagia  Patient Details Name: Casey Greene MRN: [id number] DOB: [birth year] Today's Date: 06/05/2021 Time: 6415-8309 SLP Time Calculation (min) (ACUTE ONLY): 22 min  Assessment / Plan / Recommendation Clinical Impression  Pt administered extensive oral care with coughing noted during oral care.  Suctioning provided throughout oral care and max cues to increase alertness level, but pt remained lethargic during session.  He was able to state his name and said "yes" when asked if he wanted his mouth cleaned by SLP.  No swallow initiated this session during oral care, but POs were not attempted d/t level of lethargy.  May consider non-oral feeding via PEG if family/MD in agreement to maintain nutrition/hydration.  ST will continue to f/u for PO readiness and cog/linguistic goals.  HPI HPI: 69 yr old admitted with LUE and LLE weakness. CT revealed acute right thalamic hemorrhage. 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), DM, mild neurocognitive disorder d/t multiple etiologies, prostate cancer, pontine/bil thalamic infarcts 12/26/20      SLP Plan  Continue with current plan of care       Recommendations  Diet recommendations: NPO Medication Administration: Via alternative means                General recommendations: Rehab consult Oral Care Recommendations: Oral care QID Follow up Recommendations: Inpatient Rehab SLP Visit Diagnosis: Dysphagia, unspecified (R13.10) Plan: Continue with current plan of care                       Elvina Sidle, M.S., CCC-SLP 06/05/2021, 1:10 PM

## 2021-06-05 NOTE — Care Management Important Message (Signed)
Important Message  Patient Details  Name: Casey Greene MRN: [id number] Date of Birth: 1950-12-11   Medicare Important Message Given:  Yes     Shelda Altes 06/05/2021, 10:29 AM

## 2021-06-05 NOTE — Consult Note (Signed)
   [facility] Inpatient Consult   06/05/2021  Kelsey Edman 05-31-1951 [id number]   [facility] [ACO] Patient: [employer]  Patient screened for length of stay and  hospitalization with noted high risk score for unplanned readmission risk.  aTo assess for progress and potential [place] Management service needs for post hospital transition.  Review of patient's medical record reveals patient is being recommended for rehab from recommendations of PT/OT and which level of rehab is still ongoing. Came by patient's room and he is asleep no family currently at bedside.  Plan:  Continue to follow progress and disposition to assess for post hospital care management needs.    For questions contact:   Natividad Brood, RN BSN [facility] Liaison  [phone] business mobile phone Toll free office [phone]  Fax number: [phone] [email or web address]

## 2021-06-05 NOTE — Plan of Care (Signed)

## 2021-06-05 NOTE — Progress Notes (Signed)
Inpatient Rehabilitation Admissions Coordinator   Patient not yet at a level to pursue CIR admit. Recommend other rehab venues to be pursued at this time.  Danne [place], RN, MSN Rehab Admissions Coordinator [phone] 06/05/2021 12:12 PM

## 2021-06-05 NOTE — Progress Notes (Signed)
Physical Therapy Treatment Patient Details Name: Casey Greene MRN: [id number] DOB: 08-01-1951 Today's Date: 06/05/2021    History of Present Illness 9 male admitted to [facility] on 7/3 with R thalamic hemorrhage with intraventricular extension and hydrocephalus, no plan for neurosurgical intervention. PMH includes CKD stage III, hyperlipidemia, essential hypertension, LVH, mild neurocognitive disorder on Aricept, peripheral vascular disease, chronic diastolic heart failure, ACD, type 2 diabetes mellitus, and remote strokes identified on MRI in January 2022- remote pontine and bilateral thalamic infarcts and subacute infarcts identified on imaging in June 2022 on aspirin and clopidogrel.    PT Comments    Continuing work on functional mobility and activity tolerance;  Pt fatigued, but awake and more able to interact; consistently following simple commands with incr time, consistently answering yes/no questions, and answered yes/no biographical questions correctly; session focused on upright sitting tolerance and trunk stretching, as well as sitting balance and weight shifts; continues to require Max/Total assist for mobility; Noted CIR signed off; Will need post-acute rehab at SNF  Follow Up Recommendations  SNF     Equipment Recommendations  Hospital bed;Wheelchair (measurements PT);Wheelchair cushion (measurements PT) (mechanical lift)    Recommendations for Other Services       Precautions / Restrictions Precautions Precautions: Fall    Mobility  Bed Mobility Overal bed mobility: Needs Assistance Bed Mobility: Rolling;Sidelying to Sit;Sit to Sidelying Rolling: +2 for physical assistance;Total assist Sidelying to sit: Max assist;+2 for physical assistance   Sit to supine: +2 for physical assistance;Total assist   General bed mobility comments: Pt fatigued, but awake; Total to Max assist to come to sit EOB, square off hips at EOB, and then to lay back down after stretching work in  sitting    Transfers                    Ambulation/Gait                 Stairs             Wheelchair Mobility    Modified Rankin (Stroke Patients Only) Modified Rankin (Stroke Patients Only) Pre-Morbid Rankin Score: Slight disability Modified Rankin: Severe disability     Balance Overall balance assessment: Needs assistance Sitting-balance support: Feet supported;Bilateral upper extremity supported Sitting balance-Leahy Scale: Poor Sitting balance - Comments: noting slight tendency to push with stronger R UE towards L side; Sat EOB at least 8 minutes, with support and facilitation for anterior pelvic tilt, scapular retraction, upper and lower trunk extension; needing phsyical assist to extend neck for upright head position; performed trunk twist stretches as well; Pt seemed to like trunk stretching                                    Cognition Arousal/Alertness: Awake/alert (L eye tended to stay shut, R eye open) Behavior During Therapy: [facility] for tasks assessed/performed Overall Cognitive Status: Difficult to assess                                 General Comments: answered questions yes/no questions consistently, and biographical questions with accuracy; followed simple commands consistently with incr time      Exercises      General Comments General comments (skin integrity, edema, etc.): Awake and participating      Pertinent Vitals/Pain Pain Assessment: Faces Faces Pain Scale: No hurt (facial  expression pleasant with trunk and neck stretching) Pain Intervention(s): Monitored during session    Home Living                      Prior Function            PT Goals (current goals can now be found in the care plan section) Acute Rehab PT Goals Patient Stated Goal: Did not state, but seemed happy to sit up and stretch trunk PT Goal Formulation: With patient Time For Goal Achievement: 06/13/21 Potential to  Achieve Goals: Good Progress towards PT goals: Progressing toward goals (slowly; more able to interact today)    Frequency    Min 2X/week      PT Plan Discharge plan needs to be updated;Frequency needs to be updated    Co-evaluation              AM-PAC PT "6 Clicks" Mobility   Outcome Measure  Help needed turning from your back to your side while in a flat bed without using bedrails?: Total Help needed moving from lying on your back to sitting on the side of a flat bed without using bedrails?: Total Help needed moving to and from a bed to a chair (including a wheelchair)?: Total Help needed standing up from a chair using your arms (e.g., wheelchair or bedside chair)?: Total Help needed to walk in hospital room?: Total Help needed climbing 3-5 steps with a railing? : Total 6 Click Score: 6    End of Session Equipment Utilized During Treatment:  (bed pad) Activity Tolerance: Patient tolerated treatment well Patient left: with call bell/phone within reach;in bed;with bed alarm set;with family/visitor present Nurse Communication: Mobility status;Need for lift equipment PT Visit Diagnosis: Other abnormalities of gait and mobility (R26.89);Unsteadiness on feet (R26.81);Muscle weakness (generalized) (M62.81);Difficulty in walking, not elsewhere classified (R26.2);Other symptoms and signs involving the nervous system (R29.898)     Time: 0254-2706 PT Time Calculation (min) (ACUTE ONLY): 20 min  Charges:  $Therapeutic Activity: 8-22 mins                     Roney Marion, PT  Acute Rehabilitation Services Pager [phone] Office [place] 06/05/2021, 5:01 PM

## 2021-06-05 NOTE — Progress Notes (Addendum)
PROGRESS NOTE        PATIENT DETAILS Name: Casey Greene Age: 70 y.o. Sex: male Date of Birth: 1951/08/18 Admit Date: 05/28/2021 Admitting Physician Amie Portland, MD [id number], Lillette Boxer, MD  Brief Narrative: Patient is a 70 y.o. male with history of CVA, mild neurocognitive dysfunction, PAD, chronic diastolic heart failure, DM-2, HTN, CKD stage IV who presented with left-sided hemiplegia-found to have acute right thalamic hemorrhage.  Admitted by neurology to neurointensive care-upon stability-transferred to [facility].  See below for further details.  Significant events: 7/3>> presented with left-sided hemiplegia-right thalamic hemorrhage on CT-admit to neuro ICU 7/7>> transfer to [facility] 7/7>> febrile-Unasyn started  Significant studies: 6/12>> TTE: EF 76-54%, grade 2 diastolic dysfunction. 7/3>> acute right thalamic hemorrhage with intraventricular extension and hydrocephalus. 7/3>> CT head: Unchanged size of intraparenchymal hemorrhage 7/4>> MRI brain: 2 small acute infarct in rt parietal lobe, acute right thalamic hemorrhage with intraventricular extension. 7/4>> MRA brain: No significant stenosis or aneurysm 7/4>> MRA neck: No significant stenosis 7/4>> LDL: 50 7/4>> A1c: 5.1 7/7>> CXR: No pneumonia 7/8>> CXR: No pneumonia 7/9>> CT chest: Some scattered secretions in the central trachea/right mainstem bronchus-lungs are clear. 7/9>> CT abdomen: Diffusely fluid-filled appearance of the colon with lack of formed stools, complex multilobulated ventral hernia.   Antimicrobial therapy: Unasyn: 7/7>>  Microbiology data: 7/7>> COVID/influenza PCR: Negative 7/7>> blood culture: No growth  Procedures : None  Consults: Neurology, neurosurgery, palliative care  DVT Prophylaxis : heparin injection 5,000 Units Start: 06/02/21 1515  SCDs  Subjective: Continues to have fever-although trend better.  Per nursing staff-continues to require significant amount of  suctioning.  Per nursing staff no diarrhea-had soft formed stools this morning.  Assessment/Plan: Right thalamic intracranial hemorrhage with intraventricular extension and hydrocephalus: Neurological deficits remain unchanged-left-sided hemiplegia/dysphagia persists-neurology recommending to keep SBP less than 160-await further recommendations from SLP/PT/OT.  Suspect will require PEG tube placement at some point.     Dysphagia: Currently on NG tube feedings-will ask SLP to reevaluate again today to see if dysphagia has improved-if not-will require PEG tube placement.  Appreciate palliative care input.   Fever-suspicion for microaspiration: Accumulating secretions-high suspicion for aspiration pneumonia- has secretions/mucus in his airways on ct chest.  UA without any signs of UTI-even though he has bilateral perinephric stranding.  Remains on Unasyn-suspect fever could be central in etiology-we will ask ID opinion.   1 episode of hematochezia on 7/9: CBC stable-none since then-plan is to watch closely for recurrence.  HTN: BP stable-on Coreg/hydralazine.  Goal SBP <160.   HLD: On statins prior to this hospital stay-resume when oral intake established.  HFpEF: Euvolemic on exam  Hypernatremia: Improving-continue free water flushes.  AKI CKD stage IV: Jump in creatinine overnight-start IVF-suspicion that this is hemodynamically mediated in setting of fever-continue to hold losartan.  Watch closely.  Check frequent bladder scans  History of mild neurocognitive dysfunction: Resume Aricept when able.  Normocytic anemia: Due to CKD-no indication for transfusion-follow CBC periodically.  Nutrition Status: Nutrition Problem: Severe Malnutrition Etiology: chronic illness (Alzheimers dementia, DM, CKD, CHF) Signs/Symptoms: moderate fat depletion, severe fat depletion, moderate muscle depletion, severe muscle depletion, percent weight loss Percent weight loss: 23 % Interventions: Tube feeding,  MVI, Prostat    Diet: Diet Order     None        Code Status: Full code   Family Communication: [id number] spoke  at bedside on 7/9  Disposition Plan: Status is: Inpatient  Remains inpatient appropriate because:Inpatient level of care appropriate due to severity of illness  Dispo: The patient is from: Home              Anticipated d/c is to: CIR              Patient currently is not medically stable to d/c.   Difficult to place patient No    Barriers to Discharge: ICH-severe dysphagia-n.p.o. with NG tube-not yet stable for discharge  Antimicrobial agents: Anti-infectives (From admission, onward)    Start     Dose/Rate Route Frequency Ordered Stop   06/01/21 1500  Ampicillin-Sulbactam (UNASYN) 3 g in sodium chloride 0.9 % 100 mL IVPB        3 g 200 mL/hr over 30 Minutes Intravenous Every 12 hours 06/01/21 1402          Time spent: 35 minutes-Greater than 50% of this time was spent in counseling, explanation of diagnosis, planning of further management, and coordination of care.  MEDICATIONS: Scheduled Meds:  carvedilol  25 mg Per NG tube BID WC   chlorhexidine  15 mL Mouth Rinse BID   Chlorhexidine Gluconate Cloth  6 each Topical Q0600   feeding supplement (PROSource TF)  45 mL Per Tube BID   free water  400 mL Per Tube Q6H   heparin injection (subcutaneous)  5,000 Units Subcutaneous Q8H   hydrALAZINE  100 mg Per Tube TID   mouth rinse  15 mL Mouth Rinse q12n4p   multivitamin with minerals  1 tablet Per Tube Daily   pantoprazole sodium  40 mg Per Tube QHS   senna-docusate  1 tablet Per Tube BID   sodium chloride flush  3 mL Intravenous Once   Continuous Infusions:  sodium chloride 75 mL/hr at 06/05/21 0851   ampicillin-sulbactam (UNASYN) IV 3 g (06/05/21 0436)   feeding supplement (OSMOLITE 1.5 CAL) 1,000 mL (06/05/21 0238)   PRN Meds:.acetaminophen (TYLENOL) oral liquid 160 mg/5 mL, hydrALAZINE, labetalol   PHYSICAL EXAM: Vital  signs: Vitals:   06/04/21 2120 06/04/21 2329 06/05/21 0445 06/05/21 0752  BP:  (!) 92/51 134/68 115/68  Pulse:  80 82 75  Resp:  20 18 18   Temp: (!) 100.5 F (38.1 C) 100.2 F (37.9 C) 99.3 F (37.4 C) 98.6 F (37 C)  TempSrc: Oral Oral Oral Oral  SpO2:  100% 100% 100%  Weight:      Height:       Filed Weights   05/28/21 1924 05/31/21 0500  Weight: 73.9 kg 71.4 kg   Body mass index is 22.59 kg/m.   Gen Exam: Awake-alert-following commands-remains slightly lethargic. HEENT:atraumatic, normocephalic Chest: B/L clear to auscultation anteriorly CVS:S1S2 regular Abdomen:soft non tender, non distended Extremities:no edema Neurology: Left-sided hemiplegia.  NG tube in place Skin: no rash    I have personally reviewed following labs and imaging studies  LABORATORY DATA: CBC: Recent Labs  Lab 06/02/21 0259 06/03/21 0106 06/03/21 1946 06/04/21 0241 06/05/21 0421  WBC 8.7 10.1 10.5 11.1* 8.7  HGB 9.1* 9.3* 9.5* 9.3* 8.5*  HCT 27.9* 28.6* 29.5* 28.7* 26.9*  MCV 96.9 97.3 98.3 97.6 99.6  PLT 153 161 167 169 161     Basic Metabolic Panel: Recent Labs  Lab 05/30/21 1559 05/30/21 1812 05/31/21 0458 05/31/21 1452 06/01/21 0825 06/02/21 0259 06/03/21 0106 06/04/21 0241 06/05/21 0421  NA  --   --   --   --  145 146*  148* 149* 146*  K  --   --   --   --  3.9 3.8 3.6 3.7 3.7  CL  --   --   --   --  111 113* 113* 113* 109  CO2  --   --   --   --  27 23 27 26 27   GLUCOSE  --   --   --   --  123* 147* 143* 149* 162*  BUN  --   --   --   --  43* 55* 62* 67* 83*  CREATININE  --   --   --   --  2.11* 2.58* 2.56* 2.47* 2.98*  CALCIUM  --   --   --   --  10.5* 10.8* 11.1* 11.5* 11.2*  MG 1.4* 1.5* 1.7 1.5* 1.7 2.1  --   --   --   PHOS 2.7 2.6 2.1* 2.7  --   --   --   --   --      GFR: Estimated Creatinine Clearance: 22.6 mL/min (A) (by C-G formula based on SCr of 2.98 mg/dL (H)).  Liver Function Tests: Recent Labs  Lab 06/01/21 0825 06/02/21 0259 06/03/21 0106  06/04/21 0241 06/05/21 0421  AST 37 34 31 28 23   ALT 13 13 13 12 11   ALKPHOS 46 37* 38 39 31*  BILITOT 0.6 0.8 0.7 0.9 0.5  PROT 7.0 6.8 7.0 6.8 6.2*  ALBUMIN 3.3* 3.1* 3.1* 3.0* 2.7*    No results for input(s): LIPASE, AMYLASE in the last 168 hours. No results for input(s): AMMONIA in the last 168 hours.   Coagulation Profile: No results for input(s): INR, PROTIME in the last 168 hours.   Cardiac Enzymes: No results for input(s): CKTOTAL, CKMB, CKMBINDEX, TROPONINI in the last 168 hours.  BNP (last 3 results) Recent Labs    02/03/21 1100  PROBNP 635*     Lipid Profile: No results for input(s): CHOL, HDL, LDLCALC, TRIG, CHOLHDL, LDLDIRECT in the last 72 hours.  Thyroid Function Tests: No results for input(s): TSH, T4TOTAL, FREET4, T3FREE, THYROIDAB in the last 72 hours.  Anemia Panel: No results for input(s): VITAMINB12, FOLATE, FERRITIN, TIBC, IRON, RETICCTPCT in the last 72 hours.  Urine analysis:    Component Value Date/Time   COLORURINE YELLOW 06/04/2021 1310   APPEARANCEUR HAZY (A) 06/04/2021 1310   LABSPEC 1.017 06/04/2021 1310   PHURINE 5.0 06/04/2021 1310   GLUCOSEU NEGATIVE 06/04/2021 1310   GLUCOSEU NEGATIVE 03/28/2018 1206   HGBUR NEGATIVE 06/04/2021 1310   BILIRUBINUR NEGATIVE 06/04/2021 1310   KETONESUR NEGATIVE 06/04/2021 1310   PROTEINUR NEGATIVE 06/04/2021 1310   UROBILINOGEN 0.2 03/28/2018 1206   NITRITE NEGATIVE 06/04/2021 1310   LEUKOCYTESUR NEGATIVE 06/04/2021 1310    Sepsis Labs: Lactic Acid, Venous    Component Value Date/Time   LATICACIDVEN 1.1 05/05/2021 2200    MICROBIOLOGY: Recent Results (from the past 240 hour(s))  MRSA Next Gen by PCR, Nasal     Status: None   Collection Time: 05/28/21  8:13 PM   Specimen: Nasal Mucosa; Nasal Swab  Result Value Ref Range Status   MRSA by PCR Next Gen NOT DETECTED NOT DETECTED Final    Comment: (NOTE) The GeneXpert MRSA Assay (FDA approved for NASAL specimens only), is one component  of a comprehensive MRSA colonization surveillance program. It is not intended to diagnose MRSA infection nor to guide or monitor treatment for MRSA infections. Test performance is not FDA approved in patients  less than 46 years old. Performed at [facility], [place] [address] [place], [place] [address]   Culture, blood (routine x 2)     Status: None (Preliminary result)   Collection Time: 06/01/21  8:14 AM   Specimen: BLOOD  Result Value Ref Range Status   Specimen Description BLOOD RIGHT ANTECUBITAL  Final   Special Requests   Final    BOTTLES DRAWN AEROBIC AND ANAEROBIC Blood Culture adequate volume   Culture   Final    NO GROWTH 4 DAYS Performed at [facility], [place] [address], [place], [place] [address]    Report Status PENDING  Incomplete  Culture, blood (routine x 2)     Status: None (Preliminary result)   Collection Time: 06/01/21  8:31 AM   Specimen: BLOOD RIGHT HAND  Result Value Ref Range Status   Specimen Description BLOOD RIGHT HAND  Final   Special Requests   Final    BOTTLES DRAWN AEROBIC AND ANAEROBIC Blood Culture adequate volume   Culture   Final    NO GROWTH 4 DAYS Performed at [facility], [place] [address] [place], [place] [address]    Report Status PENDING  Incomplete  Resp Panel by RT-PCR (Flu A&B, Covid) Nasopharyngeal Swab     Status: None   Collection Time: 06/01/21  2:02 PM   Specimen: Nasopharyngeal Swab; Nasopharyngeal(NP) swabs in vial transport medium  Result Value Ref Range Status   SARS Coronavirus 2 by RT PCR NEGATIVE NEGATIVE Final    Comment: (NOTE) SARS-CoV-2 target nucleic acids are NOT DETECTED.  The SARS-CoV-2 RNA is generally detectable in upper respiratory specimens during the acute phase of infection. The lowest concentration of SARS-CoV-2 viral copies this assay can detect is 138 copies/mL. A negative result does not preclude SARS-Cov-2 infection and should not be used as the sole basis for treatment or other  patient management decisions. A negative result may occur with  improper specimen collection/handling, submission of specimen other than nasopharyngeal swab, presence of viral mutation(s) within the areas targeted by this assay, and inadequate number of viral copies(<138 copies/mL). A negative result must be combined with clinical observations, patient history, and epidemiological information. The expected result is Negative.  Fact Sheet for Patients:  EntrepreneurPulse.com.au  Fact Sheet for Healthcare Providers:  IncredibleEmployment.be  This test is no t yet approved or cleared by the [place] FDA and  has been authorized for detection and/or diagnosis of SARS-CoV-2 by FDA under an Emergency Use Authorization (EUA). This EUA will remain  in effect (meaning this test can be used) for the duration of the COVID-19 declaration under Section 564(b)(1) of the Act, 21 U.S.C.section 360bbb-3(b)(1), unless the authorization is terminated  or revoked sooner.       Influenza A by PCR NEGATIVE NEGATIVE Final   Influenza B by PCR NEGATIVE NEGATIVE Final    Comment: (NOTE) The Xpert Xpress SARS-CoV-2/FLU/RSV plus assay is intended as an aid in the diagnosis of influenza from Nasopharyngeal swab specimens and should not be used as a sole basis for treatment. Nasal washings and aspirates are unacceptable for Xpert Xpress SARS-CoV-2/FLU/RSV testing.  Fact Sheet for Patients: EntrepreneurPulse.com.au  Fact Sheet for Healthcare Providers: IncredibleEmployment.be  This test is not yet approved or cleared by the [place] FDA and has been authorized for detection and/or diagnosis of SARS-CoV-2 by FDA under an Emergency Use Authorization (EUA). This EUA will remain in effect (meaning this test can be used) for the duration of  the COVID-19 declaration under Section 564(b)(1) of the Act, 21 U.S.C. section  360bbb-3(b)(1), unless the authorization is terminated or revoked.  Performed at [facility], [place] [address], [place], [place] [address]     RADIOLOGY STUDIES/RESULTS: CT ABDOMEN PELVIS WO CONTRAST  Result Date: 06/03/2021 CLINICAL DATA:  Fever of unknown origin. 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 EXAM: CT CHEST, ABDOMEN AND PELVIS WITHOUT CONTRAST TECHNIQUE: Multidetector CT imaging of the chest, abdomen and pelvis was performed following the standard protocol without IV contrast. COMPARISON:  Chest radiograph 06/02/2021, PET CT 10/25/2020 FINDINGS: CT CHEST FINDINGS Cardiovascular: Assessment limited in the absence of contrast media. Hypoattenuation of the cardiac blood pool compatible with mild anemia. Few scattered coronary artery calcifications. Normal cardiac size. No significant pericardial effusion. Atherosclerotic plaque within the normal caliber aorta. No hyperdense mural thickening or plaque displacement. No periaortic stranding. Shared origin of the brachiocephalic and left common carotid arteries. Tortuosity of the proximal great vessels. Central pulmonary arteries are normal caliber. Mediastinum/Nodes: No mediastinal fluid or gas. Normal thyroid gland and thoracic inlet. Scattered secretions noted within the central trachea and right mainstem bronchus. Transesophageal feeding tube in place with the tip positioned appropriately towards the gastric body/antrum. No acute abnormality of the thoracic esophagus. No discernible laryngeal abnormality within the partially included cervical soft tissues. No worrisome mediastinal or axillary adenopathy. Hilar nodal evaluation is limited in the absence of intravenous contrast media. Lungs/Pleura: No concerning pulmonary nodules or masses. Few scattered bandlike opacities in the lung bases, likely atelectasis or scarring. No consolidation, features of edema, pneumothorax, or effusion. Musculoskeletal: No acute osseous  abnormality or suspicious osseous lesion. Minimal degenerative changes in the spine and shoulders. CT ABDOMEN PELVIS FINDINGS Hepatobiliary: No worrisome focal liver lesions. Smooth liver surface contour. Normal hepatic attenuation. Normal gallbladder and biliary tree. No visible calcified gallstone. Mild bilateral gynecomastia. No worrisome chest wall mass. Pancreas: No pancreatic ductal dilatation or surrounding inflammatory changes. Spleen: Normal in size. No concerning splenic lesions. Adrenals/Urinary Tract: Normal adrenals. Bilateral fluid attenuation parapelvic renal cysts. No concerning focal renal lesion. Mild cortical thinning and scarring bilaterally. Nonspecific bilateral perinephric stranding. Mild left urothelial thickening. No hydronephrosis. Mild circumferential bladder wall thickening. Stomach/Bowel: Transesophageal feeding tube, as above. Stomach and duodenum are otherwise unremarkable. No small bowel thickening or dilatation. Fluid-filled appearance of the colon with lack of formed stool, may reflect a rapid transit state. Scattered colonic diverticula without focal inflammation to suggest diverticulitis. Redemonstration of the ventral midline hernia containing portion of colon. No evidence of mechanical or vascular compromise of the bowel at this time. No other sites of bowel obstruction. Appendix is not visualized. No focal inflammation the vicinity of the cecum to suggest an occult appendicitis. Vascular/Lymphatic: Atherosclerotic calcifications within the abdominal aorta and branch vessels. No aneurysm or ectasia. No enlarged abdominopelvic lymph nodes. Reproductive: Surgical changes noted at the prostate bed. No gross abnormality at the site of prior prostatectomy. Chronic presacral fat stranding. Other: Complex multilobulated ventral hernia containing portion of the transverse colon. Some stranding of the herniated fat contents both within and external to the hernia sac could reflect some  fatty strangulation (7/105). Correlate for point tenderness. No evidence of vascular compromise or mechanical obstruction of the herniated bowel loops. No other bowel containing hernia. Mild body wall edema most pronounced towards the flanks, left greater than right. No free abdominopelvic air or fluid. Chronic stranding in the presacral space. Musculoskeletal: The osseous structures appear diffusely demineralized which may limit detection of small  or nondisplaced fractures. No acute osseous abnormality or suspicious osseous lesion. Multilevel degenerative changes are present in the imaged portions of the spine. Additional mild degenerative changes in the bilateral hips. IMPRESSION: 1. Scattered secretions noted in the central trachea and right mainstem bronchus. Lungs remain otherwise clear. 2. Diffusely fluid-filled appearance of the colon with lack of formed stool. Could reflect diarrheal illness/rapid transit state. 3. Bilateral perinephric stranding with some urothelial thickening on the left and circumferential bladder wall thickening. Could reflect a cystitis and ascending tract infection in the appropriate clinical setting. Correlate with urinalysis. 4. Complex multilobulated ventral hernia containing portion of the transverse colon. No evidence of vascular compromise or bowel obstruction though some stranding of the herniated fat contents could reflect fat strangulation. Correlate for point tenderness. 5. Surgical changes from prior prostatectomy. No gross interval change from comparison PET-CT. 6. Coronary artery calcifications. 7. Hypoattenuation of the blood pool suggestive of anemia. Correlate with CBC. 8.  Aortic Atherosclerosis (ICD10-I70.0). Electronically Signed   By: Lovena Le M.D.   On: 06/03/2021 20:36   CT CHEST WO CONTRAST  Result Date: 06/03/2021 CLINICAL DATA:  Fever of unknown origin. 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 EXAM: CT CHEST, ABDOMEN AND PELVIS WITHOUT  CONTRAST TECHNIQUE: Multidetector CT imaging of the chest, abdomen and pelvis was performed following the standard protocol without IV contrast. COMPARISON:  Chest radiograph 06/02/2021, PET CT 10/25/2020 FINDINGS: CT CHEST FINDINGS Cardiovascular: Assessment limited in the absence of contrast media. Hypoattenuation of the cardiac blood pool compatible with mild anemia. Few scattered coronary artery calcifications. Normal cardiac size. No significant pericardial effusion. Atherosclerotic plaque within the normal caliber aorta. No hyperdense mural thickening or plaque displacement. No periaortic stranding. Shared origin of the brachiocephalic and left common carotid arteries. Tortuosity of the proximal great vessels. Central pulmonary arteries are normal caliber. Mediastinum/Nodes: No mediastinal fluid or gas. Normal thyroid gland and thoracic inlet. Scattered secretions noted within the central trachea and right mainstem bronchus. Transesophageal feeding tube in place with the tip positioned appropriately towards the gastric body/antrum. No acute abnormality of the thoracic esophagus. No discernible laryngeal abnormality within the partially included cervical soft tissues. No worrisome mediastinal or axillary adenopathy. Hilar nodal evaluation is limited in the absence of intravenous contrast media. Lungs/Pleura: No concerning pulmonary nodules or masses. Few scattered bandlike opacities in the lung bases, likely atelectasis or scarring. No consolidation, features of edema, pneumothorax, or effusion. Musculoskeletal: No acute osseous abnormality or suspicious osseous lesion. Minimal degenerative changes in the spine and shoulders. CT ABDOMEN PELVIS FINDINGS Hepatobiliary: No worrisome focal liver lesions. Smooth liver surface contour. Normal hepatic attenuation. Normal gallbladder and biliary tree. No visible calcified gallstone. Mild bilateral gynecomastia. No worrisome chest wall mass. Pancreas: No pancreatic  ductal dilatation or surrounding inflammatory changes. Spleen: Normal in size. No concerning splenic lesions. Adrenals/Urinary Tract: Normal adrenals. Bilateral fluid attenuation parapelvic renal cysts. No concerning focal renal lesion. Mild cortical thinning and scarring bilaterally. Nonspecific bilateral perinephric stranding. Mild left urothelial thickening. No hydronephrosis. Mild circumferential bladder wall thickening. Stomach/Bowel: Transesophageal feeding tube, as above. Stomach and duodenum are otherwise unremarkable. No small bowel thickening or dilatation. Fluid-filled appearance of the colon with lack of formed stool, may reflect a rapid transit state. Scattered colonic diverticula without focal inflammation to suggest diverticulitis. Redemonstration of the ventral midline hernia containing portion of colon. No evidence of mechanical or vascular compromise of the bowel at this time. No other sites of bowel obstruction. Appendix is not visualized. No focal inflammation the  vicinity of the cecum to suggest an occult appendicitis. Vascular/Lymphatic: Atherosclerotic calcifications within the abdominal aorta and branch vessels. No aneurysm or ectasia. No enlarged abdominopelvic lymph nodes. Reproductive: Surgical changes noted at the prostate bed. No gross abnormality at the site of prior prostatectomy. Chronic presacral fat stranding. Other: Complex multilobulated ventral hernia containing portion of the transverse colon. Some stranding of the herniated fat contents both within and external to the hernia sac could reflect some fatty strangulation (7/105). Correlate for point tenderness. No evidence of vascular compromise or mechanical obstruction of the herniated bowel loops. No other bowel containing hernia. Mild body wall edema most pronounced towards the flanks, left greater than right. No free abdominopelvic air or fluid. Chronic stranding in the presacral space. Musculoskeletal: The osseous structures  appear diffusely demineralized which may limit detection of small or nondisplaced fractures. No acute osseous abnormality or suspicious osseous lesion. Multilevel degenerative changes are present in the imaged portions of the spine. Additional mild degenerative changes in the bilateral hips. IMPRESSION: 1. Scattered secretions noted in the central trachea and right mainstem bronchus. Lungs remain otherwise clear. 2. Diffusely fluid-filled appearance of the colon with lack of formed stool. Could reflect diarrheal illness/rapid transit state. 3. Bilateral perinephric stranding with some urothelial thickening on the left and circumferential bladder wall thickening. Could reflect a cystitis and ascending tract infection in the appropriate clinical setting. Correlate with urinalysis. 4. Complex multilobulated ventral hernia containing portion of the transverse colon. No evidence of vascular compromise or bowel obstruction though some stranding of the herniated fat contents could reflect fat strangulation. Correlate for point tenderness. 5. Surgical changes from prior prostatectomy. No gross interval change from comparison PET-CT. 6. Coronary artery calcifications. 7. Hypoattenuation of the blood pool suggestive of anemia. Correlate with CBC. 8.  Aortic Atherosclerosis (ICD10-I70.0). Electronically Signed   By: Lovena Le M.D.   On: 06/03/2021 20:36     LOS: 8 days   Oren Binet, MD  [facility]    To contact the attending provider between 7A-7P or the covering provider during after hours 7P-7A, please log into the web site [email or web address] and access using universal [place] password for that web site. If you do not have the password, please call the hospital operator.  06/05/2021, 11:17 AM

## 2021-06-05 NOTE — Progress Notes (Signed)
Pharmacy Antibiotic Note  Casey Greene is a 70 y.o. male admitted on 05/28/2021 with ICH.  Pharmacy has been consulted for Unasyn dosing for aspiration pneumonia. Tmax 99.3, blood cultures sent. COVID negative.   CKD IV. Renal function borderline for Unasyn q8h vs q12h dosing. CrCl ~23 today.  Plan:  Will continue Unasyn 3gm IV q12h.  Will follow renal function, culture data, clinical progress and length of therapy.   Height: 5\' 10"  (177.8 cm) Weight: 71.4 kg (157 lb 6.5 oz) IBW/kg (Calculated) : 73  Temp (24hrs), Avg:99.9 F (37.7 C), Min:98.3 F (36.8 C), Max:101.3 F (38.5 C)  Recent Labs  Lab 06/01/21 0825 06/02/21 0259 06/03/21 0106 06/03/21 1946 06/04/21 0241 06/05/21 0421  WBC 8.6 8.7 10.1 10.5 11.1* 8.7  CREATININE 2.11* 2.58* 2.56*  --  2.47* 2.98*     Estimated Creatinine Clearance: 22.6 mL/min (A) (by C-G formula based on SCr of 2.98 mg/dL (H)).    Allergies  Allergen Reactions   Amlodipine Other (See Comments)    headache   Tamsulosin Hcl     Antimicrobials this admission:  Unasyn 7/7 >>  Dose adjustments this admission:  n/a  Microbiology results:  7/7 blood x 2: NGTD  7/7 COVID and flu: negative  7/3 MRSA PCR: negative   Casey Greene A. Levada Dy, PharmD, BCPS, FNKF Clinical Pharmacist [place] Please utilize Amion for appropriate phone number to reach the unit pharmacist ([place])  06/05/2021 7:45 AM

## 2021-06-06 ENCOUNTER — Inpatient Hospital Stay (HOSPITAL_COMMUNITY): Payer: Medicare Other

## 2021-06-06 DIAGNOSIS — R4182 Altered mental status, unspecified: Secondary | ICD-10-CM | POA: Diagnosis not present

## 2021-06-06 DIAGNOSIS — E43 Unspecified severe protein-calorie malnutrition: Secondary | ICD-10-CM | POA: Diagnosis not present

## 2021-06-06 DIAGNOSIS — Z515 Encounter for palliative care: Secondary | ICD-10-CM | POA: Diagnosis not present

## 2021-06-06 DIAGNOSIS — I610 Nontraumatic intracerebral hemorrhage in hemisphere, subcortical: Secondary | ICD-10-CM | POA: Diagnosis not present

## 2021-06-06 DIAGNOSIS — E785 Hyperlipidemia, unspecified: Secondary | ICD-10-CM | POA: Diagnosis not present

## 2021-06-06 DIAGNOSIS — Z7189 Other specified counseling: Secondary | ICD-10-CM | POA: Diagnosis not present

## 2021-06-06 LAB — GLUCOSE, CAPILLARY
Glucose-Capillary: 133 mg/dL — ABNORMAL HIGH (ref 70–99)
Glucose-Capillary: 139 mg/dL — ABNORMAL HIGH (ref 70–99)
Glucose-Capillary: 148 mg/dL — ABNORMAL HIGH (ref 70–99)
Glucose-Capillary: 143 mg/dL — ABNORMAL HIGH (ref 70–99)
Glucose-Capillary: 126 mg/dL — ABNORMAL HIGH (ref 70–99)

## 2021-06-06 LAB — BLOOD GAS, ARTERIAL
Acid-Base Excess: 1.8 mmol/L (ref 0.0–2.0)
Bicarbonate: 24.8 mmol/L (ref 20.0–28.0)
FIO2: 21
O2 Saturation: 97.6 %
Patient temperature: 36.7
pCO2 arterial: 31.1 mmHg — ABNORMAL LOW (ref 32.0–48.0)
pH, Arterial: 7.511 — ABNORMAL HIGH (ref 7.350–7.450)
pO2, Arterial: 87.7 mmHg (ref 83.0–108.0)

## 2021-06-06 LAB — CBC
HCT: 28.5 % — ABNORMAL LOW (ref 39.0–52.0)
Hemoglobin: 9.1 g/dL — ABNORMAL LOW (ref 13.0–17.0)
MCH: 31.5 pg (ref 26.0–34.0)
MCHC: 31.9 g/dL (ref 30.0–36.0)
MCV: 98.6 fL (ref 80.0–100.0)
Platelets: 180 10*3/uL (ref 150–400)
RBC: 2.89 MIL/uL — ABNORMAL LOW (ref 4.22–5.81)
RDW: 13.2 % (ref 11.5–15.5)
WBC: 9.9 10*3/uL (ref 4.0–10.5)
nRBC: 0 % (ref 0.0–0.2)

## 2021-06-06 LAB — BASIC METABOLIC PANEL
Anion gap: 7 (ref 5–15)
BUN: 81 mg/dL — ABNORMAL HIGH (ref 8–23)
CO2: 26 mmol/L (ref 22–32)
Calcium: 11.1 mg/dL — ABNORMAL HIGH (ref 8.9–10.3)
Chloride: 114 mmol/L — ABNORMAL HIGH (ref 98–111)
Creatinine, Ser: 2.72 mg/dL — ABNORMAL HIGH (ref 0.61–1.24)
GFR, Estimated: 24 mL/min — ABNORMAL LOW (ref >60)
Glucose, Bld: 147 mg/dL — ABNORMAL HIGH (ref 70–99)
Potassium: 3.7 mmol/L (ref 3.5–5.1)
Sodium: 147 mmol/L — ABNORMAL HIGH (ref 135–145)

## 2021-06-06 LAB — CULTURE, BLOOD (ROUTINE X 2)
Culture: NO GROWTH
Culture: NO GROWTH
Special Requests: ADEQUATE
Special Requests: ADEQUATE

## 2021-06-06 MED ORDER — CLONIDINE HCL 0.1 MG PO TABS
0.1000 mg | ORAL_TABLET | Freq: Two times a day (BID) | ORAL | Status: DC
  Administered 2021-06-06: 0.1 mg via ORAL
  Filled 2021-06-06: qty 1

## 2021-06-06 MED ORDER — SODIUM CHLORIDE 0.9 % IV SOLN: INTRAVENOUS | Status: DC

## 2021-06-06 MED ORDER — GLYCOPYRROLATE 0.2 MG/ML IJ SOLN
0.1000 mg | Freq: Three times a day (TID) | INTRAMUSCULAR | Status: DC
  Administered 2021-06-06: 0.1 mg via INTRAVENOUS
  Filled 2021-06-06: qty 1

## 2021-06-06 NOTE — Progress Notes (Signed)
STAT EEG Completed; Results Pending; Dr Hortense Ramal notified.

## 2021-06-06 NOTE — Progress Notes (Signed)
CCMD reported pt had 4 beat run of  VT.  Will continue to monitor closely.

## 2021-06-06 NOTE — Progress Notes (Addendum)
Patient lethargic throughout day. Occasionally opening eyes for short moment to loud voice and touch. MD Ghimire aware and at bedside multiple times during shift. Placed orders for CXR, CT scan, MRI, ABG, and EEG. Patient bladder scan of 325mL. Verbal order to in and out cath. Closely monitoring patient.   1530: Patient able to void. Bladder scanned again-135mL.

## 2021-06-06 NOTE — Care Management Important Message (Signed)
Important Message  Patient Details  Name: Casey Greene MRN: [id number] Date of Birth: May 24, 1951   Medicare Important Message Given:  Yes     Minta Fair Montine Circle 06/06/2021, 2:57 PM

## 2021-06-06 NOTE — Progress Notes (Signed)
Nutrition Follow-up  DOCUMENTATION CODES:   Severe malnutrition in context of chronic illness  INTERVENTION:  Continue TF via Cortrak: Osmolite 1.5 at 55 ml/h (1320 ml per day) Prosource TF 45 ml BID  Provides 2060 kcal, 104 gm protein, 1003 ml free water daily  MVI with minerals per tube; daily  TF regimen may be continued if/when PEG is placed.    NUTRITION DIAGNOSIS:   Severe Malnutrition related to chronic illness (Alzheimers dementia, DM, CKD, CHF) as evidenced by moderate fat depletion, severe fat depletion, moderate muscle depletion, severe muscle depletion, percent weight loss. 23% x 6 months -- ongoing  GOAL:   Patient will meet greater than or equal to 90% of their needs -- met with TF  MONITOR:   Diet advancement, TF tolerance, Labs  REASON FOR ASSESSMENT:   Consult Enteral/tube feeding initiation and management  ASSESSMENT:   Pt with PMH of CKD stage III, HLD, HTN, mild neurocognitive disorder, PVD, CHF, DM, remote strokes now admitted with R thalamic hemorrhage wit intraventricular extension and hydrocephalus with L hemiparesis.    7/3 admit to neuro ICU 7/5 failed swallow; cortrak placed  7/7 tx to TRH; febrile, suspicions for microaspiration (no PNA noted in repeat CXRs)  Pt tolerating TF via Cortrak per RN. Current regimen: Osmolite 1.5 @ 63m/hr with 464mProsource TF BID. Pt was to have PEG placed by IR; however, per MD, "This MD has had repeated discussions with spouse-she is aware of the issue with aspiration pneumonia-and placing a PEG tube will not minimize risk of aspiration. Given ongoing lethargy-aspiration issues/accumulation of secretions-with lack of any significant improvement-he remains with poor prognosis-and at risk for further decompensation.  Spoke at length with spouse on 7/12-I relayed my concerns-I have implored her to reexamine goals of care-I would see if palliative care can give them a call again to rediscuss goals of care."  Recommend continuing TF via Cortrak, will monitor for decision regarding [place] MillsIf pt is to remain full scope, recommend placement of PEG and continuation of above TF regimen.   Medications: Scheduled Meds:  carvedilol  25 mg Per NG tube BID WC   chlorhexidine  15 mL Mouth Rinse BID   Chlorhexidine Gluconate Cloth  6 each Topical Q0600   feeding supplement (PROSource TF)  45 mL Per Tube BID   free water  400 mL Per Tube Q6H   heparin injection (subcutaneous)  5,000 Units Subcutaneous Q8H   hydrALAZINE  100 mg Per Tube TID   mouth rinse  15 mL Mouth Rinse q12n4p   multivitamin with minerals  1 tablet Per Tube Daily   pantoprazole sodium  40 mg Per Tube QHS   senna-docusate  1 tablet Per Tube BID   sodium chloride flush  3 mL Intravenous Once   Continuous Infusions:  sodium chloride 50 mL/hr at 06/06/21 1319   ampicillin-sulbactam (UNASYN) IV 3 g (06/06/21 0416)   feeding supplement (OSMOLITE 1.5 CAL) 1,000 mL (06/05/21 2244)   Labs: Recent Labs  Lab 05/30/21 1812 05/31/21 0458 05/31/21 1452 06/01/21 0825 06/01/21 0825 06/02/21 0259 06/03/21 0106 06/04/21 0241 06/05/21 0421 06/06/21 0327  NA  --   --   --  145   < > 146*   < > 149* 146* 147*  K  --   --   --  3.9   < > 3.8   < > 3.7 3.7 3.7  CL  --   --   --  111   < > 113*   < >  113* 109 114*  CO2  --   --   --  27   < > 23   < > _0 BUN  --   --   --  43*   < > 55*   < > 67* 83* 81*  CREATININE  --   --   --  2.11*   < > 2.58*   < > 2.47* 2.98* 2.72*  CALCIUM  --   --   --  10.5*   < > 10.8*   < > 11.5* 11.2* 11.1*  MG 1.5* 1.7 1.5* 1.7  --  2.1  --   --   --   --   PHOS 2.6 2.1* 2.7  --   --   --   --   --   --   --   GLUCOSE  --   --   --  123*   < > 147*   < > 149* 162* 147*   < > = values in this interval not displayed.  CBGs [phone]  UOP: 1852m x24 hours Stool: 3x unmeasured occurrences x24 hours  Diet Order:   Diet Order     None       EDUCATION NEEDS:   Not appropriate for education at this  time  Skin:  Skin Assessment: Reviewed RN Assessment  Last BM:  7/11  Height:   Ht Readings from Last 1 Encounters:  05/28/21 _1  (1.778 m)    Weight:   Wt Readings from Last 1 Encounters:  05/31/21 71.4 kg     BMI:  Body mass index is 22.59 kg/m.  Estimated Nutritional Needs:   Kcal:  1900-2100  Protein:  95-110 grams  Fluid:  > 1.8 L/day   ALarkin Ina MS, RD, LDN (she/her/hers) RD pager number and weekend/on-call pager number located in [place]

## 2021-06-06 NOTE — TOC Initial Note (Signed)
Transition of Care [facility]) - Initial/Assessment Note    Patient Details  Name: Casey Greene MRN: [id number] Date of Birth: 07-04-1951  [facility] of [facility]) CM/SW Contact:    Geralynn Ochs, LCSW Phone Number: 06/06/2021, 4:41 PM  Clinical Narrative:        CSW spoke with patient's wife to discuss discharge planning, per wife's request. Wife discussed concern about bringing him home as they are in an apartment and not a house, and they do not have any medical equipment. CSW explained recommendation for SNF at this time, and wife indicated agreement but that their parents had been in SNFs and they had issues with some of them so she wanted to start the process early to find one that they like. CSW provided information on SNF close to home, and directed wife to look at [facility].gov as well as [employer] for reviews of facilities that she may be interested in. Wife discussed concern on if patient will need long term care, and CSW suggested that wife contact Medicaid to start the process of seeing if he qualifies for that if he needs it. Wife to call and start that. Wife appreciative of information. Patient not medically stable to pursue SNF at this time, but CSW will follow and fax out referral when patient is more appropriate.            Expected Discharge Plan: Skilled Nursing Facility Barriers to Discharge: Continued Medical Work up, Ship broker   Patient Goals and CMS Choice Patient states their goals for this hospitalization and ongoing recovery are:: patient unable to participate in goal setting CMS Medicare.gov Compare Post Acute Care list provided to:: Patient Represenative (must comment) Choice offered to / list presented to : Spouse  Expected Discharge Plan and Services Expected Discharge Plan: [place] Choice: [place] Living arrangements for the past 2 months: Apartment                                       Prior Living Arrangements/Services Living arrangements for the past 2 months: Apartment Lives with:: Spouse, Adult Children Patient language and need for interpreter reviewed:: No Do you feel safe going back to the place where you live?: Yes      Need for Family Participation in Patient Care: Yes (Comment) Care giver support system in place?: No (comment)   Criminal Activity/Legal Involvement Pertinent to Current Situation/Hospitalization: No - Comment as needed  Activities of Daily Living Home Assistive Devices/Equipment: Gilford Rile (specify type), Eyeglasses    Permission Sought/Granted Permission sought to share information with : Facility Sport and exercise psychologist, Family Supports Permission granted to share information with : Yes, Verbal Permission Granted  Share Information with NAME: [name]  Permission granted to share info w AGENCY: SNF  Permission granted to share info w Relationship: Spouse     Emotional Assessment   Attitude/Demeanor/Rapport: Unable to Assess Affect (typically observed): Unable to Assess Orientation: : Oriented to Self Alcohol / Substance Use: Not Applicable Psych Involvement: No (comment)  Admission diagnosis:  ICH (intracerebral hemorrhage) ([place]) [I61.9] Thalamic hemorrhage ([place]) [I61.0] Patient Active Problem List   Diagnosis Date Noted   Protein-calorie malnutrition, severe 05/30/2021   ICH (intracerebral hemorrhage) ([place]) 05/28/2021   Radiation proctitis 05/19/2021   AKI (acute kidney injury) ([place]) 05/06/2021   Acute cystitis 05/06/2021   Acute encephalopathy 05/05/2021   Ceruminosis, left 02/10/2021  Chronic diastolic CHF (congestive heart failure) ([place]) 02/03/2021   Urinary retention    Mild neurocognitive disorder due to multiple etiologies 12/30/2020   PAC (premature atrial contraction)    Stroke    History of urinary frequency 12/05/2020   History of gout    Dysrhythmia    Decreased appetite    Laryngeal mass 06/17/2020   DOE  (dyspnea on exertion) 06/17/2020   Localized osteoarthritis of left knee 06/11/2020   LVH (left ventricular hypertrophy) due to hypertensive disease    Ascending aorta dilation 07/29/2019   Nonsustained ventricular tachycardia 07/23/2019   Elevated hemoglobin A1c 06/11/2019   Corn or callus 06/11/2019   Hypokalemia 12/09/2018   Constipation 12/01/2018   Mass of thyroid region 03/28/2018   Anemia of chronic disease 03/28/2018   Edema 07/09/2016   Major depressive disorder 02/27/2016   CKD (chronic kidney disease) stage 3, GFR 30-59 ml/min 10/13/2014   Hepatitis C virus infection cured after antiviral drug therapy 07/05/2014   Hyperlipidemia 06/29/2014   Prostate cancer 06/09/2014   Essential hypertension 06/09/2014   Onychomycosis 12/01/2013   PVD (peripheral vascular disease) ([place]) 12/01/2013   PCP:  [name], MD Pharmacy:   [facility]  ([place]) - [place], [place] - [address] [place] [address] Phone: [phone] Fax: [place] #[id number] - [address] [place] AT [facility] [place] [address] Phone: [phone] Fax: [phone]     Social Determinants of Health ([place]) Interventions    Readmission Risk Interventions Readmission Risk Prevention Plan 10/09/2020  Medication Screening Complete  Transportation Screening Complete  Some recent data might be hidden

## 2021-06-06 NOTE — Progress Notes (Signed)
Palliative:  HPI: 70 y.o. male with past medical history of multiple CVAs, DM, HFpEF (G2DD), PVD, prostate CA, CKD 3, MDD, mild neurocognitive disorder, and severe malnutrition, who was admitted on 05/28/2021 with left sided weakness.  He was found to have an intercerebral hemorrage in the right thalamic area with intraventricular extension and hydrocephalus.  Neurology recommendations have been made.  He is on aspirin and plavix and being considered for CIR.  A cor trak was placed for nutrition.  On 7/7 the patient developed a fever. Blood cultures are still pending but his very wet vocal quality and thick secretions give rise to suspicion for aspiration pneumonia. Palliative care requested to re-engage in conversations 06/06/21 due to decreased mental status and ongoing aspiration with copious secretions.    I met today at Mr. Bortner's bedside along with Dr. Sloan Leiter and RN. Casey Greene does not respond or awaken to examination with multiple attempts. He only coughed. This is an acute decline in mentation from yesterday according to Dr. Sloan Leiter and RN. Plans for further work up from Dr. Sloan Leiter and neurology.   I called and spoke with wife, [name], who has spoken with Dr. Sloan Leiter regarding her husband's changes and decline in mentation. [name] wants their 2 children to be involved in any further conversation and we have set time to meet in the morning 0730 06/07/21 to discuss these changes and reevaluate plan of care. [name] and I did speak more about Cortrak being temporary and the concerns and complications and expectations with PEG placement. We will discuss further in the morning.   All questions/concerns addressed to best of my ability. Emotional support provided.   Exam: Unresponsive. Does not make attempt to open eyes and no movement of extremities. Breathing labored with copious secretions - strong cough but unable to clear secretions or expectorate. Abd soft.   Plan: - Family meeting 06/07/21 2111.    15 min  Vinie Sill, NP Palliative Medicine Team Pager [phone] (Please see [email or web address] for schedule) Team Phone [phone]    Greater than 50%  of this time was spent counseling and coordinating care related to the above assessment and plan

## 2021-06-06 NOTE — Progress Notes (Addendum)
PROGRESS NOTE        PATIENT DETAILS Name: Casey Greene Age: 70 y.o. Sex: male Date of Birth: Jul 05, 1951 Admit Date: 05/28/2021 Admitting Physician Amie Portland, MD [id number], Lillette Boxer, MD  Brief Narrative: Patient is a 70 y.o. male with history of CVA, mild neurocognitive dysfunction, PAD, chronic diastolic heart failure, DM-2, HTN, CKD stage IV who presented with left-sided hemiplegia-found to have acute right thalamic hemorrhage.  Admitted by neurology to neurointensive care-upon stability-transferred to [facility]  See below for further details.  Significant events: 7/3>> presented with left-sided hemiplegia-right thalamic hemorrhage on CT-admit to neuro ICU 7/7>> transfer to [facility] 7/7>> febrile-suspicion for microaspiration-Unasyn started  Significant studies: 6/12>> TTE: EF 89-37%, grade 2 diastolic dysfunction. 7/3>> acute right thalamic hemorrhage with intraventricular extension and hydrocephalus. 7/3>> CT head: Unchanged size of intraparenchymal hemorrhage 7/4>> MRI brain: 2 small acute infarct in rt parietal lobe, acute right thalamic hemorrhage with intraventricular extension. 7/4>> MRA brain: No significant stenosis or aneurysm 7/4>> MRA neck: No significant stenosis 7/4>> LDL: 50 7/4>> A1c: 5.1 7/7>> CXR: No pneumonia 7/8>> CXR: No pneumonia 7/9>> CT chest: Some scattered secretions in the central trachea/right mainstem bronchus-lungs are clear. 7/9>> CT abdomen: Diffusely fluid-filled appearance of the colon with lack of formed stools, complex multilobulated ventral hernia.  7/12>> CT head: Decreased right thalamic/intraventricular hemorrhage since last week 7/12>> CXR: No pneumonia  Antimicrobial therapy: Unasyn: 7/7>>  Microbiology data: 7/7>> COVID/influenza PCR: Negative 7/7>> blood culture: No growth  Procedures : None  Consults: Neurology, neurosurgery, palliative care,ID  DVT Prophylaxis : heparin injection 5,000 Units Start:  06/02/21 1515  SCDs  Subjective: Afebrile overnight-a little bit more lethargic than yesterday-mumbles 1-2 words.  No major issues overnight per nursing staff.  Continues to accumulate secretions at times.  No diarrhea.  Assessment/Plan: Right thalamic intracranial hemorrhage with intraventricular extension and hydrocephalus: Neurological deficits remain unchanged-left-sided hemiplegia/dysphagia persists-neurology recommending to keep SBP less than 160-bit more lethargic today (repeat CT head stable/ABG without hypercarbia).  Continue supportive care-continues to have dysphagia and accumulates secretion-family desires full scope of treatment (see palliative care note)-have consulted IR for PEG tube placement.    Addendum: continues to be hard to arouse (CThead/ABG done earlier this am)-coughing-grimaces to pain-per RN opens eyes at time. Spoke with Stroke MD-Dr Lowry Bowl evaluate-but recommends MRI Brain and EEG.Given issues with lethargy-stop both clonidine and Robinul (was already lethargic this am-when these agents were started)  Dysphagia: Currently on NG tube feedings-continues to have significant dysphagia-have consulted IR for PEG tube placement.   Fever-suspicion for microaspiration: High suspicion for microaspiration-accumulating secretions-although no parenchymal disease seen on imaging studies-has secretions/mucus in his airways (seen on CT chest).  Doubt UTI-UA benign-even though he has some bilateral perinephric standing on CT.  Suspect that this is either microaspiration-or central fever.  Appreciate ID input-recommendations are for total of 7 days of Unasyn.     1 episode of hematochezia on 7/9: CBC stable-none since then-plan is to watch closely for recurrence.  Hemoglobin relatively stable.  HTN: BP creeping up-goal SBP less than 160-add clonidine-continue Coreg/hydralazine.  Losartan on hold due to fluctuating renal function.   HLD: On statins prior to this hospital stay-resume when  oral intake established.  HFpEF: Euvolemic on exam  Hypernatremia: Mild-overall slowly improving-on free water flushes-repeat electrolytes tomorrow.  AKI CKD stage IV: AKI improving-after starting normal saline.  Continue to hold losartan.  No signs of urinary retention-check frequent bladder scans.  History of mild neurocognitive dysfunction: Resume Aricept when able.  Normocytic anemia: Due to CKD-no indication for transfusion-follow CBC periodically.  Palliative care: Full code-appreciate palliative care input-patient/family desires full scope of treatment.  This MD has had repeated discussions with spouse-she is aware of the issue with aspiration pneumonia-and placing a PEG tube will not minimize risk of aspiration.  Given ongoing lethargy-aspiration issues/accumulation of secretions-with lack of any significant improvement-he remains with poor prognosis-and at risk for further decompensation.  Spoke at length with spouse on 7/12-I relayed my concerns-I have implored her to reexamine goals of care-I would see if palliative care can give them a call again to rediscuss goals of care.  Nutrition Status: Nutrition Problem: Severe Malnutrition Etiology: chronic illness (Alzheimers dementia, DM, CKD, CHF) Signs/Symptoms: moderate fat depletion, severe fat depletion, moderate muscle depletion, severe muscle depletion, percent weight loss Percent weight loss: 23 % Interventions: Tube feeding, MVI, Prostat    Diet: Diet Order     None        Code Status: Full code   Family Communication: [phone] (cell)/[phone] (home)-Long discussion on 7/12-regarding risk of ongoing aspiration/decompensation.  Disposition Plan: Status is: Inpatient  Remains inpatient appropriate because:Inpatient level of care appropriate due to severity of illness  Dispo: The patient is from: Home              Anticipated d/c is to: CIR              Patient currently is not medically  stable to d/c.   Difficult to place patient No   Barriers to Discharge: ICH-severe dysphagia-n.p.o. with NG tube-not yet stable for discharge  Antimicrobial agents: Anti-infectives (From admission, onward)    Start     Dose/Rate Route Frequency Ordered Stop   06/01/21 1500  Ampicillin-Sulbactam (UNASYN) 3 g in sodium chloride 0.9 % 100 mL IVPB        3 g 200 mL/hr over 30 Minutes Intravenous Every 12 hours 06/01/21 1402 06/08/21 1559        Time spent: 35 minutes-Greater than 50% of this time was spent in counseling, explanation of diagnosis, planning of further management, and coordination of care.  MEDICATIONS: Scheduled Meds:  carvedilol  25 mg Per NG tube BID WC   chlorhexidine  15 mL Mouth Rinse BID   Chlorhexidine Gluconate Cloth  6 each Topical Q0600   cloNIDine  0.1 mg Oral BID   feeding supplement (PROSource TF)  45 mL Per Tube BID   free water  400 mL Per Tube Q6H   glycopyrrolate  0.1 mg Intravenous TID   heparin injection (subcutaneous)  5,000 Units Subcutaneous Q8H   hydrALAZINE  100 mg Per Tube TID   mouth rinse  15 mL Mouth Rinse q12n4p   multivitamin with minerals  1 tablet Per Tube Daily   pantoprazole sodium  40 mg Per Tube QHS   senna-docusate  1 tablet Per Tube BID   sodium chloride flush  3 mL Intravenous Once   Continuous Infusions:  ampicillin-sulbactam (UNASYN) IV 3 g (06/06/21 0416)   feeding supplement (OSMOLITE 1.5 CAL) 1,000 mL (06/05/21 2244)   PRN Meds:.acetaminophen (TYLENOL) oral liquid 160 mg/5 mL, hydrALAZINE, labetalol   PHYSICAL EXAM: Vital signs: Vitals:   06/06/21 0510 06/06/21 0608 06/06/21 0627 06/06/21 0810  BP: (!) 167/100 (!) 178/110 (!) 158/81 (!) 182/93  Pulse:   94 (!) 103  Resp:    20  Temp:  53 F (36.7 C)  TempSrc:    Oral  SpO2:    99%  Weight:      Height:       Filed Weights   05/28/21 1924 05/31/21 0500  Weight: 73.9 kg 71.4 kg   Body mass index is 22.59 kg/m.   Gen Exam: Slightly more lethargic  than yesterday-opens eyes-will say yes/no in one-word sentences.  But then goes back to sleep.  Continues to accumulate secretions. HEENT:atraumatic, normocephalic Chest: B/L clear to auscultation anteriorly CVS:S1S2 regular Abdomen:soft non tender, non distended Extremities:no edema Neurology: Left-sided hemiplegia. Skin: no rash   I have personally reviewed following labs and imaging studies  LABORATORY DATA: CBC: Recent Labs  Lab 06/03/21 0106 06/03/21 1946 06/04/21 0241 06/05/21 0421 06/06/21 0327  WBC 10.1 10.5 11.1* 8.7 9.9  HGB 9.3* 9.5* 9.3* 8.5* 9.1*  HCT 28.6* 29.5* 28.7* 26.9* 28.5*  MCV 97.3 98.3 97.6 99.6 98.6  PLT 161 167 169 161 180     Basic Metabolic Panel: Recent Labs  Lab 05/30/21 1559 05/30/21 1812 05/31/21 0458 05/31/21 1452 06/01/21 0825 06/01/21 0825 06/02/21 0259 06/03/21 0106 06/04/21 0241 06/05/21 0421 06/06/21 0327  NA  --   --   --   --  145   < > 146* 148* 149* 146* 147*  K  --   --   --   --  3.9   < > 3.8 3.6 3.7 3.7 3.7  CL  --   --   --   --  111   < > 113* 113* 113* 109 114*  CO2  --   --   --   --  27   < > 23 27 26 27 26   GLUCOSE  --   --   --   --  123*   < > 147* 143* 149* 162* 147*  BUN  --   --   --   --  43*   < > 55* 62* 67* 83* 81*  CREATININE  --   --   --   --  2.11*   < > 2.58* 2.56* 2.47* 2.98* 2.72*  CALCIUM  --   --   --   --  10.5*   < > 10.8* 11.1* 11.5* 11.2* 11.1*  MG 1.4* 1.5* 1.7 1.5* 1.7  --  2.1  --   --   --   --   PHOS 2.7 2.6 2.1* 2.7  --   --   --   --   --   --   --    < > = values in this interval not displayed.     GFR: Estimated Creatinine Clearance: 24.8 mL/min (A) (by C-G formula based on SCr of 2.72 mg/dL (H)).  Liver Function Tests: Recent Labs  Lab 06/01/21 0825 06/02/21 0259 06/03/21 0106 06/04/21 0241 06/05/21 0421  AST 37 34 31 28 23   ALT 13 13 13 12 11   ALKPHOS 46 37* 38 39 31*  BILITOT 0.6 0.8 0.7 0.9 0.5  PROT 7.0 6.8 7.0 6.8 6.2*  ALBUMIN 3.3* 3.1* 3.1* 3.0* 2.7*     No results for input(s): LIPASE, AMYLASE in the last 168 hours. No results for input(s): AMMONIA in the last 168 hours.   Coagulation Profile: No results for input(s): INR, PROTIME in the last 168 hours.   Cardiac Enzymes: No results for input(s): CKTOTAL, CKMB, CKMBINDEX, TROPONINI in the last 168 hours.  BNP (last 3 results) Recent Labs  02/03/21 1100  PROBNP 635*     Lipid Profile: No results for input(s): CHOL, HDL, LDLCALC, TRIG, CHOLHDL, LDLDIRECT in the last 72 hours.  Thyroid Function Tests: No results for input(s): TSH, T4TOTAL, FREET4, T3FREE, THYROIDAB in the last 72 hours.  Anemia Panel: No results for input(s): VITAMINB12, FOLATE, FERRITIN, TIBC, IRON, RETICCTPCT in the last 72 hours.  Urine analysis:    Component Value Date/Time   COLORURINE YELLOW 06/04/2021 1310   APPEARANCEUR HAZY (A) 06/04/2021 1310   LABSPEC 1.017 06/04/2021 1310   PHURINE 5.0 06/04/2021 1310   GLUCOSEU NEGATIVE 06/04/2021 1310   GLUCOSEU NEGATIVE 03/28/2018 1206   HGBUR NEGATIVE 06/04/2021 1310   BILIRUBINUR NEGATIVE 06/04/2021 1310   KETONESUR NEGATIVE 06/04/2021 1310   PROTEINUR NEGATIVE 06/04/2021 1310   UROBILINOGEN 0.2 03/28/2018 1206   NITRITE NEGATIVE 06/04/2021 1310   LEUKOCYTESUR NEGATIVE 06/04/2021 1310    Sepsis Labs: Lactic Acid, Venous    Component Value Date/Time   LATICACIDVEN 1.1 05/05/2021 2200    MICROBIOLOGY: Recent Results (from the past 240 hour(s))  MRSA Next Gen by PCR, Nasal     Status: None   Collection Time: 05/28/21  8:13 PM   Specimen: Nasal Mucosa; Nasal Swab  Result Value Ref Range Status   MRSA by PCR Next Gen NOT DETECTED NOT DETECTED Final    Comment: (NOTE) The GeneXpert MRSA Assay (FDA approved for NASAL specimens only), is one component of a comprehensive MRSA colonization surveillance program. It is not intended to diagnose MRSA infection nor to guide or monitor treatment for MRSA infections. Test performance is not FDA  approved in patients less than 39 years old. Performed at [facility], [place] [address] [place], [place] [address]   Culture, blood (routine x 2)     Status: None   Collection Time: 06/01/21  8:14 AM   Specimen: BLOOD  Result Value Ref Range Status   Specimen Description BLOOD RIGHT ANTECUBITAL  Final   Special Requests   Final    BOTTLES DRAWN AEROBIC AND ANAEROBIC Blood Culture adequate volume   Culture   Final    NO GROWTH 5 DAYS Performed at [facility], [place] [address], [place], [place] [address]    Report Status 06/06/2021 FINAL  Final  Culture, blood (routine x 2)     Status: None   Collection Time: 06/01/21  8:31 AM   Specimen: BLOOD RIGHT HAND  Result Value Ref Range Status   Specimen Description BLOOD RIGHT HAND  Final   Special Requests   Final    BOTTLES DRAWN AEROBIC AND ANAEROBIC Blood Culture adequate volume   Culture   Final    NO GROWTH 5 DAYS Performed at [facility], [place] [address], [place], [place] [address]    Report Status 06/06/2021 FINAL  Final  Resp Panel by RT-PCR (Flu A&B, Covid) Nasopharyngeal Swab     Status: None   Collection Time: 06/01/21  2:02 PM   Specimen: Nasopharyngeal Swab; Nasopharyngeal(NP) swabs in vial transport medium  Result Value Ref Range Status   SARS Coronavirus 2 by RT PCR NEGATIVE NEGATIVE Final    Comment: (NOTE) SARS-CoV-2 target nucleic acids are NOT DETECTED.  The SARS-CoV-2 RNA is generally detectable in upper respiratory specimens during the acute phase of infection. The lowest concentration of SARS-CoV-2 viral copies this assay can detect is 138 copies/mL. A negative result does not preclude SARS-Cov-2 infection and should not be used as the sole basis for treatment or  other patient management decisions. A negative result may occur with  improper specimen collection/handling, submission of specimen other than nasopharyngeal swab, presence of viral mutation(s) within the areas targeted by this  assay, and inadequate number of viral copies(<138 copies/mL). A negative result must be combined with clinical observations, patient history, and epidemiological information. The expected result is Negative.  Fact Sheet for Patients:  EntrepreneurPulse.com.au  Fact Sheet for Healthcare Providers:  IncredibleEmployment.be  This test is no t yet approved or cleared by the [place] FDA and  has been authorized for detection and/or diagnosis of SARS-CoV-2 by FDA under an Emergency Use Authorization (EUA). This EUA will remain  in effect (meaning this test can be used) for the duration of the COVID-19 declaration under Section 564(b)(1) of the Act, 21 U.S.C.section 360bbb-3(b)(1), unless the authorization is terminated  or revoked sooner.       Influenza A by PCR NEGATIVE NEGATIVE Final   Influenza B by PCR NEGATIVE NEGATIVE Final    Comment: (NOTE) The Xpert Xpress SARS-CoV-2/FLU/RSV plus assay is intended as an aid in the diagnosis of influenza from Nasopharyngeal swab specimens and should not be used as a sole basis for treatment. Nasal washings and aspirates are unacceptable for Xpert Xpress SARS-CoV-2/FLU/RSV testing.  Fact Sheet for Patients: EntrepreneurPulse.com.au  Fact Sheet for Healthcare Providers: IncredibleEmployment.be  This test is not yet approved or cleared by the [place] FDA and has been authorized for detection and/or diagnosis of SARS-CoV-2 by FDA under an Emergency Use Authorization (EUA). This EUA will remain in effect (meaning this test can be used) for the duration of the COVID-19 declaration under Section 564(b)(1) of the Act, 21 U.S.C. section 360bbb-3(b)(1), unless the authorization is terminated or revoked.  Performed at [facility], [place] [address] [place], [place] [address]   Fungus culture, blood     Status: None (Preliminary result)   Collection Time:  06/05/21  1:43 PM   Specimen: BLOOD RIGHT HAND  Result Value Ref Range Status   Specimen Description BLOOD RIGHT HAND  Final   Special Requests   Final    BOTTLES DRAWN AEROBIC ONLY Blood Culture results may not be optimal due to an inadequate volume of blood received in culture bottles   Culture   Final    NO GROWTH < 24 HOURS Performed at [facility], [place] [address] [place], [place] [address]    Report Status PENDING  Incomplete    RADIOLOGY STUDIES/RESULTS: CT HEAD WO CONTRAST  Result Date: 06/06/2021 CLINICAL DATA:  70 year old male with intracranial hemorrhage. EXAM: CT HEAD WITHOUT CONTRAST TECHNIQUE: Contiguous axial images were obtained from the base of the skull through the vertex without intravenous contrast. COMPARISON:  Head CT 06/03/2021 and earlier. FINDINGS: Brain: Rounded hyperdense hemorrhage in the right thalamus has slightly decreased since 06/03/2021, now approximately 26 mm diameter (versus 28-31 mm diameter previously, coronal image 36 today versus the same coronal image previously). Stable mild regional mass effect. Decreased intraventricular hemorrhage since last week, small volume residual. No ventriculomegaly. No new intracranial hemorrhage. Basilar cisterns remain normal. Stable gray-white matter differentiation elsewhere with patchy white matter hypodensity. No midline shift or evidence of cortically based acute infarction. Vascular: No suspicious intracranial vascular hyperdensity. Skull: No acute osseous abnormality identified. Sinuses/Orbits: Visualized paranasal sinuses and mastoids are stable and well aerated. Other: Left side nasoenteric tube remains in place. Stable orbit and scalp soft tissues. IMPRESSION: 1. Slightly decreased right thalamic and intraventricular hemorrhage since last week. Stable mild  regional mass effect. 2. No new intracranial abnormality. Electronically Signed   By: Genevie Ann M.D.   On: 06/06/2021 09:40   DG Chest Port 1 View  Result  Date: 06/06/2021 CLINICAL DATA:  70 year old male with shortness of breath. Intracranial hemorrhage. EXAM: PORTABLE CHEST 1 VIEW COMPARISON:  CT Chest, Abdomen, and Pelvis today are reported separately. 06/03/2021 and earlier. FINDINGS: Portable AP upright view at 0826 hours. Enteric feeding tube courses to the abdomen, tip not included. Lung volumes, mediastinal contours are stable and within normal limits. Allowing for portable technique the lungs are clear. No pneumothorax. No acute osseous abnormality identified. Negative visible bowel gas pattern. IMPRESSION: Enteric tube remains in place. No acute cardiopulmonary abnormality. Electronically Signed   By: Genevie Ann M.D.   On: 06/06/2021 08:33     LOS: 9 days   Oren Binet, MD  [facility]    To contact the attending provider between 7A-7P or the covering provider during after hours 7P-7A, please log into the web site [email or web address] and access using universal [place] password for that web site. If you do not have the password, please call the hospital operator.  06/06/2021, 10:58 AM

## 2021-06-06 NOTE — Procedures (Addendum)
Patient Name: Willard Farquharson  MRN: [id number]  Epilepsy Attending: Lora Havens  Referring Physician/Provider: Dr Oren Binet Date: 06/06/2021 Duration: 23.05 mins  Patient history: 70yo M with ams. EEG to evaluate for seizure.  Level of alertness:  lethargic, asleep  AEDs during EEG study: None  Technical aspects: This EEG study was done with scalp electrodes positioned according to the 10-20 International system of electrode placement. Electrical activity was acquired at a sampling rate of 500Hz  and reviewed with a high frequency filter of 70Hz  and a low frequency filter of 1Hz . EEG data were recorded continuously and digitally stored.   Description: No posterior dominant rhythm was seen. Sleep was characterized by sleep spindles (12-14hz 0, maximal frontocentral region. EEG showed continuous/ generalized 5 to 7 Hz theta as well as intermittent 2-3hz  delta slowing. Hyperventilation and photic stimulation were not performed.     ABNORMALITY - Continuous slow, generalized  IMPRESSION: This study is suggestive of moderate diffuse encephalopathy, nonspecific etiology. No seizures or epileptiform discharges were seen throughout the recording.   Itzayana Pardy Barbra Sarks

## 2021-06-06 NOTE — Consult Note (Signed)
NAME:  Casey Greene, MRN:  [id number], DOB:  1951-11-11, LOS: 9 ADMISSION DATE:  05/28/2021, CONSULTATION DATE:  7/12 REFERRING MD:  Dr. Sloan Leiter, CHIEF COMPLAINT:  Somnolence    History of Present Illness:  Patient is encephalopathic and/or intubated. Therefore history has been obtained from chart review.   70 year old male with PMH as below, which is significant for CKD III, CHF, HTN, Hep C s/p treatment, HTN, Prostate Ca, DM, and stroke. At baseline he walks with a cane and relies on his wife for assistance with some ADLs. Recently admitted to [facility] for stroke and UTI. Presented again to [facility] on 7/3 after wife found him slumped over in the bathroom complaining he didn't feel well. Blood pressure was checked at home with systolic of 562 and EMS was called. Upon EMS arrival left arm and leg were noted to be weak with left sided facial symptoms consistent with stroke. CT scan showed right sided thalamic hemorrhage with intraventricular extension. He was admitted to the ICU under the neurology service for neurosurgical consultation and blood pressure control with cleviprex infusion. Neurosurgery did not recommend intervention. MRi showed two small acute infarcts in the R parietal lobe.  He was transferred out of ICU on 7/7 at which point he was "barely arousable" on exam with dysarthria and aphasia, but was able to follow commands, albeit slowly. That same day he developed fevers and there was some concern for aspiration pneumonia. Antibiotics were started. From that point his course has been significant for slowly worsening exam and ongoing issues secretions/aspiration. PCCM consulted 7/12 for these complaints.   Pertinent  Medical History   has a past medical history of Anemia of chronic disease (03/28/2018), Ascending aorta dilation (07/29/2019), Bleeding per rectum (12/09/2018), CKD (chronic kidney disease) stage 3, GFR 30-59 ml/min (HCC) (10/13/2014), Constipation  (12/01/2018), Corn or callus (06/11/2019), Decreased appetite, Diastolic congestive heart failure ([place]) (02/03/2021), DOE (dyspnea on exertion) (06/17/2020), Dysrhythmia, Edema (07/09/2016), Elevated hemoglobin A1c (06/11/2019), Essential hypertension (06/09/2014), Hepatitis C virus infection cured after antiviral drug therapy (07/05/2014), History of gout, History of urinary frequency (12/05/2020), Hyperlipidemia (06/29/2014), Hypokalemia (12/09/2018), Laryngeal mass (06/17/2020), Localized osteoarthritis of left knee (06/11/2020), LVH (left ventricular hypertrophy) due to hypertensive disease, Major depressive disorder (02/27/2016), Mass of thyroid region (03/28/2018), Mild neurocognitive disorder due to multiple etiologies (12/30/2020), Nonsustained ventricular tachycardia (07/23/2019), Onychomycosis (12/01/2013), PAC (premature atrial contraction), Prostate cancer (1997), PVD (peripheral vascular disease) (12/01/2013), Stroke, and Urinary retention.   Significant Hospital Events: Including procedures, antibiotic start and stop dates in addition to other pertinent events   6/12>> TTE: EF 13-08%, grade 2 diastolic dysfunction. 7/3>> acute right thalamic hemorrhage with intraventricular extension and hydrocephalus. 7/3>> CT head: Unchanged size of intraparenchymal hemorrhage 7/4>> MRI brain: 2 small acute infarct in rt parietal lobe, acute right thalamic hemorrhage with intraventricular extension. 7/4>> MRA brain: No significant stenosis or aneurysm 7/4>> MRA neck: No significant stenosis 7/7 > tx to floor, ABX for aspiration started.  7/12 >PCCM consult for ongoing aspiration.   Interim History / Subjective:    Objective   Blood pressure (!) 102/58, pulse 93, temperature 98 F (36.7 C), temperature source Oral, resp. rate 19, height 5\' 10"  (1.778 m), weight 71.4 kg, SpO2 99 %.        Intake/Output Summary (Last 24 hours) at 06/06/2021 1428 Last data filed at 06/06/2021 1200 Gross per 24 hour   Intake 1304.42 ml  Output 1600 ml  Net -295.58 ml   [employer]  05/28/21 1924 05/31/21 0500  Weight: 73.9 kg 71.4 kg    Examination: General: Elderly male in bed HENT: [employer]/AT, PERRL, no appreciable JVD Lungs: Clear bilateral breath sounds Cardiovascular: RRR, no MRG Abdomen: Soft, non-tender, non-distended. Hyperactive bowel sounds.  Extremities: No acute deformity. No edema. Neuro: Does not open eyes. Grimace to sternal rub. Left sided hemiplegia.   Resolved Hospital Problem list     Assessment & Plan:   Right sided thalamic hemorrhage: with intraventricular extension. Likely hypertensive in origin.  CVA right parietal lobe.  - Holding home plavix - PT/OT/SLP ongoing - SBP goal < 160 mmHg on clonidine, hydralazine, coreg. Holding losartan due to renal funciton  Aspiration: secondary to [place] - Aspiration precautions - Supplemental O2 - No urgent need for intubation at this time - Continue Unasyn for total course of 7 days as recommended by ID.  - I do worry the patient will slowly decline to the point of requiring intubation as it is unlikely his cognition and ability to protect his airway will see meaningful improvement in the short term. Given his constellation of symptoms and location of ICH, intubation will almost certainly lead to tracheostomy. Will need to make sure family is aware of what that means prior to moving forward. If he would not endorse tracheostomy, would be wise to decline intubation. Historically patient and family have been supportive of aggressive measures.   Dysphagia - TF per primary - PEF placement pending.   AKI CKD IV - trend BMP   Best Practice (right click and "Reselect all SmartList Selections" daily)   Diet/type: tubefeeds DVT prophylaxis: prophylactic heparin  GI prophylaxis: PPI Lines: N/A Foley:  N/A Code Status:  full code Last date of multidisciplinary goals of care discussion [ ]   Labs   CBC: Recent Labs  Lab  06/03/21 0106 06/03/21 1946 06/04/21 0241 06/05/21 0421 06/06/21 0327  WBC 10.1 10.5 11.1* 8.7 9.9  HGB 9.3* 9.5* 9.3* 8.5* 9.1*  HCT 28.6* 29.5* 28.7* 26.9* 28.5*  MCV 97.3 98.3 97.6 99.6 98.6  PLT 161 167 169 161 093    Basic Metabolic Panel: Recent Labs  Lab 05/30/21 1559 05/30/21 1812 05/31/21 0458 05/31/21 1452 06/01/21 0825 06/01/21 0825 06/02/21 0259 06/03/21 0106 06/04/21 0241 06/05/21 0421 06/06/21 0327  NA  --   --   --   --  145   < > 146* 148* 149* 146* 147*  K  --   --   --   --  3.9   < > 3.8 3.6 3.7 3.7 3.7  CL  --   --   --   --  111   < > 113* 113* 113* 109 114*  CO2  --   --   --   --  27   < > 23 27 26 27 26   GLUCOSE  --   --   --   --  123*   < > 147* 143* 149* 162* 147*  BUN  --   --   --   --  43*   < > 55* 62* 67* 83* 81*  CREATININE  --   --   --   --  2.11*   < > 2.58* 2.56* 2.47* 2.98* 2.72*  CALCIUM  --   --   --   --  10.5*   < > 10.8* 11.1* 11.5* 11.2* 11.1*  MG 1.4* 1.5* 1.7 1.5* 1.7  --  2.1  --   --   --   --  PHOS 2.7 2.6 2.1* 2.7  --   --   --   --   --   --   --    < > = values in this interval not displayed.   GFR: Estimated Creatinine Clearance: 24.8 mL/min (A) (by C-G formula based on SCr of 2.72 mg/dL (H)). Recent Labs  Lab 06/02/21 1243 06/03/21 0106 06/03/21 1946 06/04/21 0241 06/05/21 0421 06/06/21 0327  PROCALCITON 0.12 0.17  --  0.14  --   --   WBC  --  10.1 10.5 11.1* 8.7 9.9    Liver Function Tests: Recent Labs  Lab 06/01/21 0825 06/02/21 0259 06/03/21 0106 06/04/21 0241 06/05/21 0421  AST 37 34 31 28 23   ALT 13 13 13 12 11   ALKPHOS 46 37* 38 39 31*  BILITOT 0.6 0.8 0.7 0.9 0.5  PROT 7.0 6.8 7.0 6.8 6.2*  ALBUMIN 3.3* 3.1* 3.1* 3.0* 2.7*   No results for input(s): LIPASE, AMYLASE in the last 168 hours. No results for input(s): AMMONIA in the last 168 hours.  ABG    Component Value Date/Time   PHART 7.511 (H) 06/06/2021 0843   PCO2ART 31.1 (L) 06/06/2021 0843   PO2ART 87.7 06/06/2021 0843   HCO3  24.8 06/06/2021 0843   TCO2 26 05/28/2021 1758   O2SAT 97.6 06/06/2021 0843     Coagulation Profile: No results for input(s): INR, PROTIME in the last 168 hours.  Cardiac Enzymes: No results for input(s): CKTOTAL, CKMB, CKMBINDEX, TROPONINI in the last 168 hours.  HbA1C: Hemoglobin A1C  Date/Time Value Ref Range Status  04/23/2019 12:00 AM 11.2  Final   Hgb A1c MFr Bld  Date/Time Value Ref Range Status  05/29/2021 02:24 AM 5.1 4.8 - 5.6 % Final    Comment:    (NOTE) Pre diabetes:          5.7%-6.4%  Diabetes:              >6.4%  Glycemic control for   <7.0% adults with diabetes   05/05/2021 09:50 PM 5.2 4.8 - 5.6 % Final    Comment:    (NOTE)         Prediabetes: 5.7 - 6.4         Diabetes: >6.4         Glycemic control for adults with diabetes: <7.0     CBG: Recent Labs  Lab 06/05/21 1938 06/05/21 2320 06/06/21 0334 06/06/21 0808 06/06/21 1149  GLUCAP 140* 146* 133* 139* 148*    Review of Systems:   Patient is encephalopathic and/or intubated. Therefore history has been obtained from chart review.    Past Medical History:  He,  has a past medical history of Anemia of chronic disease (03/28/2018), Ascending aorta dilation (07/29/2019), Bleeding per rectum (12/09/2018), CKD (chronic kidney disease) stage 3, GFR 30-59 ml/min (HCC) (10/13/2014), Constipation (12/01/2018), Corn or callus (06/11/2019), Decreased appetite, Diastolic congestive heart failure ([place]) (02/03/2021), DOE (dyspnea on exertion) (06/17/2020), Dysrhythmia, Edema (07/09/2016), Elevated hemoglobin A1c (06/11/2019), Essential hypertension (06/09/2014), Hepatitis C virus infection cured after antiviral drug therapy (07/05/2014), History of gout, History of urinary frequency (12/05/2020), Hyperlipidemia (06/29/2014), Hypokalemia (12/09/2018), Laryngeal mass (06/17/2020), Localized osteoarthritis of left knee (06/11/2020), LVH (left ventricular hypertrophy) due to hypertensive disease, Major depressive  disorder (02/27/2016), Mass of thyroid region (03/28/2018), Mild neurocognitive disorder due to multiple etiologies (12/30/2020), Nonsustained ventricular tachycardia (07/23/2019), Onychomycosis (12/01/2013), PAC (premature atrial contraction), Prostate cancer (1997), PVD (peripheral vascular disease) (12/01/2013), Stroke, and Urinary retention.   Surgical History:  Past Surgical History:  Procedure Laterality Date   ACHILLES TENDON REPAIR     CERVICAL DISC SURGERY     neck   COLONOSCOPY     before 2010    LYMPHADENECTOMY Bilateral 01/26/2016   Procedure: EXTENDED BILATERAL PELVIC LYMPHADENECTOMY;  Surgeon: Raynelle Bring, MD;  Location: [facility];  Service: Urology;  Laterality: Bilateral;   NOSE SURGERY     in high school-nasal fracturex2   ROBOT ASSISTED LAPAROSCOPIC RADICAL PROSTATECTOMY N/A 01/26/2016   Procedure: XI ROBOTIC ASSISTED LAPAROSCOPIC RADICAL PROSTATECTOMY LEVEL 3;  Surgeon: Raynelle Bring, MD;  Location: [facility];  Service: Urology;  Laterality: N/A;     Social History:   reports that he quit smoking about 15 years ago. His smoking use included cigarettes. He has a 5.00 pack-year smoking history. He has never used smokeless tobacco. He reports previous drug use. Drugs: Cocaine and Marijuana. He reports that he does not drink alcohol.   Family History:  His family history includes ALS in his sister; Cervical cancer in his mother; Dementia in his mother; Diabetes in his father; Heart disease in his father and mother; Obesity in his mother; Stroke in his father, paternal grandfather, and paternal grandmother. There is no history of Colon cancer, Esophageal cancer, Colon polyps, Rectal cancer, or Stomach cancer.   Allergies Allergies  Allergen Reactions   Amlodipine Other (See Comments)    headache   Tamsulosin Hcl      Home Medications  Prior to Admission medications   Medication Sig Start Date End Date Taking? Authorizing Provider  aspirin EC 81 MG tablet Take 81 mg by mouth  daily. Swallow whole.   Yes [provider]  atorvastatin (LIPITOR) 40 MG tablet Take 1 tablet (40 mg total) by mouth at bedtime. 05/09/21  Yes Pokhrel, Corrie Mckusick, MD  carvedilol (COREG) 6.25 MG tablet Take 1 tablet (6.25 mg total) by mouth 2 (two) times daily with a meal. 03/06/21  Yes Libby Maw, MD  clopidogrel (PLAVIX) 75 MG tablet Take 1 tablet (75 mg total) by mouth daily. 04/17/21  Yes [name], MD  donepezil (ARICEPT) 10 MG tablet Take 1 tablet (10 mg total) by mouth in the morning. 05/22/21  Yes [name], MD  ferrous sulfate 325 (65 FE) MG tablet Take 325 mg by mouth daily with breakfast.   Yes [provider]  furosemide (LASIX) 40 MG tablet Take 1 tablet (40 mg total) by mouth every other day. Patient taking differently: Take 40 mg by mouth daily. 05/09/21  Yes Pokhrel, [place], MD  hydrALAZINE (APRESOLINE) 100 MG tablet Take 1 tablet (100 mg total) by mouth 3 (three) times daily. Patient taking differently: Take 100 mg by mouth daily. 01/23/21  Yes [name], MD  losartan (COZAAR) 50 MG tablet Take 1 tablet (50 mg total) by mouth daily. 04/14/21  Yes Libby Maw, MD  OVER THE COUNTER MEDICATION Take 1 capsule by mouth daily. Bladder Support formula ([place], [place], Cretavox)   Yes [provider]  oxybutynin (DITROPAN) 5 MG tablet TAKE 1 TABLET BY MOUTH AT  BEDTIME Patient taking differently: Take 5 mg by mouth at bedtime. 05/28/21  Yes [name], MD  vitamin C (ASCORBIC ACID) 500 MG tablet Take 500 mg by mouth daily. 01/28/21  Yes [provider]  sucralfate (CARAFATE) 1 GM/10ML suspension Place 20 mLs (2 g total) rectally 2 (two) times daily. Mix 2 gm (20 ml) with 10 ml of warm water. Instill rectally using an enema  bag. Patient not taking: Reported on 05/29/2021 05/19/21   [name], MD     Critical care time:      Georgann Housekeeper, AGACNP-BC [place] for personal  pager PCCM on call pager [phone] until 7pm. Please call Elink 7p-7a. [phone]  06/06/2021 3:01 PM

## 2021-06-06 NOTE — Consult Note (Signed)
Chief Complaint: Patient was seen in consultation today for percutaneous gastrostomy tube placement Chief Complaint  Patient presents with   Code Stroke     Referring Physician(s): [place]  Supervising Physician: Mir, Sharen Heck  Patient Status: [facility] - In-pt  History of Present Illness: Casey Greene is a 70 y.o. male with past medical history significant for anemia, chronic kidney disease, congestive heart failure, dysrhythmia, hypertension, hepatitis C treated with antiviral therapy, gout, hyperlipidemia, osteoarthritis, left ventricular hypertrophy, depression, mild neurocognitive disorder , prostate cancer 1997 with prior prostatectomy, peripheral vascular disease, stroke in January of this year who recently presented with left-sided hemiplegia and was found to have acute right thalamic hemorrhage.  He was on Plavix as outpatient and currently on subcu heparin.  He is currently receiving tube feeds via NG and has dysphagia.  He continues to have some intermittent fevers.  Current labs include WBC 9.9, hemoglobin 9.1, platelets 180K, creatinine 2.72, PT/INR normal, COVID-19 negative, latest blood culture pending.  Chest x-ray with no acute cardiopulmonary abnormality.  CT head today with no new intracranial abnormality.  Patient remains lethargic.  He is currently on IV Unasyn.  ID has also been consulted.  Request now received from primary care team for gastrostomy tube placement.  Past Medical History:  Diagnosis Date   Anemia of chronic disease 03/28/2018   Ascending aorta dilation 07/29/2019   (a) Echo 07/29/19 55mm   Bleeding per rectum 12/09/2018   CKD (chronic kidney disease) stage 3, GFR 30-59 ml/min ([place]) 10/13/2014   Discussed with patient importance of good BP control, avoiding nephrotoxic meds/supplements in preventing further progression   Constipation 12/01/2018   Corn or callus 06/11/2019   Decreased appetite    Diastolic congestive heart failure ([place]) 02/03/2021    DOE (dyspnea on exertion) 06/17/2020   Dysrhythmia    Edema 07/09/2016   Elevated hemoglobin A1c 06/11/2019   Essential hypertension 06/09/2014   Overview:  metoprolol caused bradycardia amlodipine causes headache, leg swelling  Last Assessment & Plan:  We had a long discussion regarding goals of treatment of HTN to prevent worsening cardiac and renal disease.  We also discussed options to change his regimen.  He states that he would prefer to seek a practitioner who can recommend herbal remedies as he feels thhis will be a healthier path f   Hepatitis C virus infection cured after antiviral drug therapy 07/05/2014   History of gout    History of urinary frequency 12/05/2020   Hyperlipidemia 06/29/2014   Hypokalemia 12/09/2018   Laryngeal mass 06/17/2020   Localized osteoarthritis of left knee 06/11/2020   Left knee injection-06/10/2020   LVH (left ventricular hypertrophy) due to hypertensive disease    (a) echo 07/29/19 severe concentric LVH   Major depressive disorder 02/27/2016   Mass of thyroid region 03/28/2018   Mild neurocognitive disorder due to multiple etiologies 12/30/2020   Nonsustained ventricular tachycardia 07/23/2019   (a) Zio monitor 06/2019. Subsequent echo with severe concentric LVH, but normal EF 55-60%   Onychomycosis 12/01/2013   PAC (premature atrial contraction)    (a) ZIO 06/2019. Asymptomatic   Prostate cancer 1997   Dx in 1997, never treated, taking herbal supplements. S/p prostatectomy 2017   PVD (peripheral vascular disease) 12/01/2013   Stroke    Per 12/26/20 MRI - remote pontine and bilateral thalamic infarcts    Urinary retention     Past Surgical History:  Procedure Laterality Date   ACHILLES TENDON REPAIR     CERVICAL DISC SURGERY  neck   COLONOSCOPY     before 2010    LYMPHADENECTOMY Bilateral 01/26/2016   Procedure: EXTENDED BILATERAL PELVIC LYMPHADENECTOMY;  Surgeon: Raynelle Bring, MD;  Location: [facility];  Service: Urology;  Laterality: Bilateral;    NOSE SURGERY     in high school-nasal fracturex2   ROBOT ASSISTED LAPAROSCOPIC RADICAL PROSTATECTOMY N/A 01/26/2016   Procedure: XI ROBOTIC ASSISTED LAPAROSCOPIC RADICAL PROSTATECTOMY LEVEL 3;  Surgeon: Raynelle Bring, MD;  Location: [facility];  Service: Urology;  Laterality: N/A;    Allergies: Amlodipine and Tamsulosin hcl  Medications: Prior to Admission medications   Medication Sig Start Date End Date Taking? Authorizing Provider  aspirin EC 81 MG tablet Take 81 mg by mouth daily. Swallow whole.   Yes [provider]  atorvastatin (LIPITOR) 40 MG tablet Take 1 tablet (40 mg total) by mouth at bedtime. 05/09/21  Yes Pokhrel, Corrie Mckusick, MD  carvedilol (COREG) 6.25 MG tablet Take 1 tablet (6.25 mg total) by mouth 2 (two) times daily with a meal. 03/06/21  Yes Libby Maw, MD  clopidogrel (PLAVIX) 75 MG tablet Take 1 tablet (75 mg total) by mouth daily. 04/17/21  Yes [name], MD  donepezil (ARICEPT) 10 MG tablet Take 1 tablet (10 mg total) by mouth in the morning. 05/22/21  Yes [name], MD  ferrous sulfate 325 (65 FE) MG tablet Take 325 mg by mouth daily with breakfast.   Yes [provider]  furosemide (LASIX) 40 MG tablet Take 1 tablet (40 mg total) by mouth every other day. Patient taking differently: Take 40 mg by mouth daily. 05/09/21  Yes Pokhrel, [place], MD  hydrALAZINE (APRESOLINE) 100 MG tablet Take 1 tablet (100 mg total) by mouth 3 (three) times daily. Patient taking differently: Take 100 mg by mouth daily. 01/23/21  Yes [name], MD  losartan (COZAAR) 50 MG tablet Take 1 tablet (50 mg total) by mouth daily. 04/14/21  Yes Libby Maw, MD  OVER THE COUNTER MEDICATION Take 1 capsule by mouth daily. Bladder Support formula ([place], [place], Cretavox)   Yes [provider]  oxybutynin (DITROPAN) 5 MG tablet TAKE 1 TABLET BY MOUTH AT  BEDTIME Patient taking differently: Take 5 mg by mouth at bedtime. 05/28/21  Yes [name], MD  vitamin C (ASCORBIC ACID) 500 MG tablet Take 500 mg by mouth daily. 01/28/21  Yes [provider]  sucralfate (CARAFATE) 1 GM/10ML suspension Place 20 mLs (2 g total) rectally 2 (two) times daily. Mix 2 gm (20 ml) with 10 ml of warm water. Instill rectally using an enema bag. Patient not taking: Reported on 05/29/2021 05/19/21   [name], MD     Family History  Problem Relation Age of Onset   Heart disease Mother    Obesity Mother    Cervical cancer Mother    Dementia Mother        unspecified type; symptom onset in late 60s/early 2s   Heart disease Father    Diabetes Father    Stroke Father    ALS Sister    Stroke Paternal Grandmother    Stroke Paternal Grandfather    Colon cancer Neg Hx    Esophageal cancer Neg Hx    Colon polyps Neg Hx    Rectal cancer Neg Hx    Stomach cancer Neg Hx     Social History   Socioeconomic History   Marital status: Married    Spouse name: Not on file   Number  of children: Not on file   Years of education: 15   Highest education level: Some college, no degree  Occupational History   Occupation: retired  Tobacco Use   Smoking status: Former    Packs/day: 0.50    Years: 10.00    Pack years: 5.00    Types: Cigarettes    Quit date: 01/11/2006    Years since quitting: 15.4   Smokeless tobacco: Never  Vaping Use   Vaping Use: Never used  Substance and Sexual Activity   Alcohol use: No   Drug use: Not Currently    Types: Cocaine, Marijuana    Comment: hx of cocaine abuse; stopped approximately 12 years prior   Sexual activity: Not Currently  Other Topics Concern   Not on file  Social History Narrative   Right handed    Lives with family    Social Determinants of Health   Financial Resource Strain: Low Risk    Difficulty of Paying Living Expenses: Not hard at all  Food Insecurity: No Food Insecurity   Worried About Charity fundraiser in the Last Year: Never true   [place] in the Last Year: Never true   Transportation Needs: No Transportation Needs   Lack of Transportation (Medical): No   Lack of Transportation (Non-Medical): No  Physical Activity: Insufficiently Active   Days of Exercise per Week: 7 days   Minutes of Exercise per Session: 20 min  Stress: No Stress Concern Present   Feeling of Stress : Not at all  Social Connections: Moderately Integrated   Frequency of Communication with Friends and Family: More than three times a week   Frequency of Social Gatherings with Friends and Family: Once a week   Attends Religious Services: 1 to 4 times per year   Active Member of [employer] or Organizations: No   Attends Music therapist: Never   Marital Status: Married      Review of Systems unable to obtain due to patient's lethargic state  Vital Signs: BP (!) 151/80 (BP Location: Right Arm)   Pulse 93   Temp (!) 102.4 F (39.1 C) (Axillary)   Resp 16   Ht 5\' 10"  (1.778 m)   Wt 157 lb 6.5 oz (71.4 kg)   SpO2 100%   BMI 22.59 kg/m   Physical Exam patient lethargic.  Will occasionally mumble a few words.  NG tube in place, chest clear to auscultation bilaterally anteriorly.  Heart with normal rate, occasional ectopy.  Abdomen soft, positive bowel sounds, nontender.  No lower extremity edema.  Left sided hemiplegia  Imaging: CT ABDOMEN PELVIS WO CONTRAST  Result Date: 06/03/2021 CLINICAL DATA:  Fever of unknown origin. 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 EXAM: CT CHEST, ABDOMEN AND PELVIS WITHOUT CONTRAST TECHNIQUE: Multidetector CT imaging of the chest, abdomen and pelvis was performed following the standard protocol without IV contrast. COMPARISON:  Chest radiograph 06/02/2021, PET CT 10/25/2020 FINDINGS: CT CHEST FINDINGS Cardiovascular: Assessment limited in the absence of contrast media. Hypoattenuation of the cardiac blood pool compatible with mild anemia. Few scattered coronary artery calcifications. Normal cardiac size. No significant  pericardial effusion. Atherosclerotic plaque within the normal caliber aorta. No hyperdense mural thickening or plaque displacement. No periaortic stranding. Shared origin of the brachiocephalic and left common carotid arteries. Tortuosity of the proximal great vessels. Central pulmonary arteries are normal caliber. Mediastinum/Nodes: No mediastinal fluid or gas. Normal thyroid gland and thoracic inlet. Scattered secretions noted within the central trachea and  right mainstem bronchus. Transesophageal feeding tube in place with the tip positioned appropriately towards the gastric body/antrum. No acute abnormality of the thoracic esophagus. No discernible laryngeal abnormality within the partially included cervical soft tissues. No worrisome mediastinal or axillary adenopathy. Hilar nodal evaluation is limited in the absence of intravenous contrast media. Lungs/Pleura: No concerning pulmonary nodules or masses. Few scattered bandlike opacities in the lung bases, likely atelectasis or scarring. No consolidation, features of edema, pneumothorax, or effusion. Musculoskeletal: No acute osseous abnormality or suspicious osseous lesion. Minimal degenerative changes in the spine and shoulders. CT ABDOMEN PELVIS FINDINGS Hepatobiliary: No worrisome focal liver lesions. Smooth liver surface contour. Normal hepatic attenuation. Normal gallbladder and biliary tree. No visible calcified gallstone. Mild bilateral gynecomastia. No worrisome chest wall mass. Pancreas: No pancreatic ductal dilatation or surrounding inflammatory changes. Spleen: Normal in size. No concerning splenic lesions. Adrenals/Urinary Tract: Normal adrenals. Bilateral fluid attenuation parapelvic renal cysts. No concerning focal renal lesion. Mild cortical thinning and scarring bilaterally. Nonspecific bilateral perinephric stranding. Mild left urothelial thickening. No hydronephrosis. Mild circumferential bladder wall thickening. Stomach/Bowel: Transesophageal  feeding tube, as above. Stomach and duodenum are otherwise unremarkable. No small bowel thickening or dilatation. Fluid-filled appearance of the colon with lack of formed stool, may reflect a rapid transit state. Scattered colonic diverticula without focal inflammation to suggest diverticulitis. Redemonstration of the ventral midline hernia containing portion of colon. No evidence of mechanical or vascular compromise of the bowel at this time. No other sites of bowel obstruction. Appendix is not visualized. No focal inflammation the vicinity of the cecum to suggest an occult appendicitis. Vascular/Lymphatic: Atherosclerotic calcifications within the abdominal aorta and branch vessels. No aneurysm or ectasia. No enlarged abdominopelvic lymph nodes. Reproductive: Surgical changes noted at the prostate bed. No gross abnormality at the site of prior prostatectomy. Chronic presacral fat stranding. Other: Complex multilobulated ventral hernia containing portion of the transverse colon. Some stranding of the herniated fat contents both within and external to the hernia sac could reflect some fatty strangulation (7/105). Correlate for point tenderness. No evidence of vascular compromise or mechanical obstruction of the herniated bowel loops. No other bowel containing hernia. Mild body wall edema most pronounced towards the flanks, left greater than right. No free abdominopelvic air or fluid. Chronic stranding in the presacral space. Musculoskeletal: The osseous structures appear diffusely demineralized which may limit detection of small or nondisplaced fractures. No acute osseous abnormality or suspicious osseous lesion. Multilevel degenerative changes are present in the imaged portions of the spine. Additional mild degenerative changes in the bilateral hips. IMPRESSION: 1. Scattered secretions noted in the central trachea and right mainstem bronchus. Lungs remain otherwise clear. 2. Diffusely fluid-filled appearance of the  colon with lack of formed stool. Could reflect diarrheal illness/rapid transit state. 3. Bilateral perinephric stranding with some urothelial thickening on the left and circumferential bladder wall thickening. Could reflect a cystitis and ascending tract infection in the appropriate clinical setting. Correlate with urinalysis. 4. Complex multilobulated ventral hernia containing portion of the transverse colon. No evidence of vascular compromise or bowel obstruction though some stranding of the herniated fat contents could reflect fat strangulation. Correlate for point tenderness. 5. Surgical changes from prior prostatectomy. No gross interval change from comparison PET-CT. 6. Coronary artery calcifications. 7. Hypoattenuation of the blood pool suggestive of anemia. Correlate with CBC. 8.  Aortic Atherosclerosis (ICD10-I70.0). Electronically Signed   By: Lovena Le M.D.   On: 06/03/2021 20:36   CT HEAD WO CONTRAST  Result Date: 06/06/2021 CLINICAL DATA:  70 year old male with intracranial hemorrhage. EXAM: CT HEAD WITHOUT CONTRAST TECHNIQUE: Contiguous axial images were obtained from the base of the skull through the vertex without intravenous contrast. COMPARISON:  Head CT 06/03/2021 and earlier. FINDINGS: Brain: Rounded hyperdense hemorrhage in the right thalamus has slightly decreased since 06/03/2021, now approximately 26 mm diameter (versus 28-31 mm diameter previously, coronal image 36 today versus the same coronal image previously). Stable mild regional mass effect. Decreased intraventricular hemorrhage since last week, small volume residual. No ventriculomegaly. No new intracranial hemorrhage. Basilar cisterns remain normal. Stable gray-white matter differentiation elsewhere with patchy white matter hypodensity. No midline shift or evidence of cortically based acute infarction. Vascular: No suspicious intracranial vascular hyperdensity. Skull: No acute osseous abnormality identified. Sinuses/Orbits:  Visualized paranasal sinuses and mastoids are stable and well aerated. Other: Left side nasoenteric tube remains in place. Stable orbit and scalp soft tissues. IMPRESSION: 1. Slightly decreased right thalamic and intraventricular hemorrhage since last week. Stable mild regional mass effect. 2. No new intracranial abnormality. Electronically Signed   By: Genevie Ann M.D.   On: 06/06/2021 09:40   CT HEAD WO CONTRAST  Result Date: 06/03/2021 CLINICAL DATA:  Follow-up cerebral hemorrhage EXAM: CT HEAD WITHOUT CONTRAST TECHNIQUE: Contiguous axial images were obtained from the base of the skull through the vertex without intravenous contrast. COMPARISON:  Head CT from 6 days ago FINDINGS: Brain: Subacute right thalamic hematoma which is decreased in bulk, now 2.3 cm maximal on axial slices. There is intraventricular clot in the lateral ventricles which has diminished, as has ventricular volume. No evidence of cortical infarct or extra-axial collection. Chronic small vessel ischemia in the deep white matter. Vascular: No hyperdense vessel or unexpected calcification. Skull: Normal. Negative for fracture or focal lesion. Sinuses/Orbits: Left cataract resection Other: Subcutaneous dermal inclusion cyst in the left suboccipital region. IMPRESSION: Decreasing right thalamic and intraventricular hemorrhage. Ventriculomegaly has resolved. Electronically Signed   By: Monte Fantasia M.D.   On: 06/03/2021 05:42   CT HEAD WO CONTRAST  Result Date: 05/29/2021 CLINICAL DATA:  Intracranial hemorrhage follow up EXAM: CT HEAD WITHOUT CONTRAST TECHNIQUE: Contiguous axial images were obtained from the base of the skull through the vertex without intravenous contrast. COMPARISON:  05/28/2021 FINDINGS: Brain: Unchanged size of intraparenchymal hematoma centered in the right thalamus. Intraventricular extension of blood is unchanged. Unchanged mild communicating hydrocephalus. No new site of hemorrhage. Vascular: No abnormal hyperdensity  of the major intracranial arteries or dural venous sinuses. No intracranial atherosclerosis. Skull: The visualized skull base, calvarium and extracranial soft tissues are normal. Sinuses/Orbits: No fluid levels or advanced mucosal thickening of the visualized paranasal sinuses. No mastoid or middle ear effusion. The orbits are normal. IMPRESSION: 1. Unchanged size of intraparenchymal hematoma centered in the right thalamus with intraventricular extension. 2. Unchanged mild communicating hydrocephalus. Electronically Signed   By: Ulyses Jarred M.D.   On: 05/29/2021 00:10   CT CHEST WO CONTRAST  Result Date: 06/03/2021 CLINICAL DATA:  Fever of unknown origin. 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 EXAM: CT CHEST, ABDOMEN AND PELVIS WITHOUT CONTRAST TECHNIQUE: Multidetector CT imaging of the chest, abdomen and pelvis was performed following the standard protocol without IV contrast. COMPARISON:  Chest radiograph 06/02/2021, PET CT 10/25/2020 FINDINGS: CT CHEST FINDINGS Cardiovascular: Assessment limited in the absence of contrast media. Hypoattenuation of the cardiac blood pool compatible with mild anemia. Few scattered coronary artery calcifications. Normal cardiac size. No significant pericardial effusion. Atherosclerotic plaque within the normal caliber aorta. No hyperdense mural thickening or plaque  displacement. No periaortic stranding. Shared origin of the brachiocephalic and left common carotid arteries. Tortuosity of the proximal great vessels. Central pulmonary arteries are normal caliber. Mediastinum/Nodes: No mediastinal fluid or gas. Normal thyroid gland and thoracic inlet. Scattered secretions noted within the central trachea and right mainstem bronchus. Transesophageal feeding tube in place with the tip positioned appropriately towards the gastric body/antrum. No acute abnormality of the thoracic esophagus. No discernible laryngeal abnormality within the partially included cervical  soft tissues. No worrisome mediastinal or axillary adenopathy. Hilar nodal evaluation is limited in the absence of intravenous contrast media. Lungs/Pleura: No concerning pulmonary nodules or masses. Few scattered bandlike opacities in the lung bases, likely atelectasis or scarring. No consolidation, features of edema, pneumothorax, or effusion. Musculoskeletal: No acute osseous abnormality or suspicious osseous lesion. Minimal degenerative changes in the spine and shoulders. CT ABDOMEN PELVIS FINDINGS Hepatobiliary: No worrisome focal liver lesions. Smooth liver surface contour. Normal hepatic attenuation. Normal gallbladder and biliary tree. No visible calcified gallstone. Mild bilateral gynecomastia. No worrisome chest wall mass. Pancreas: No pancreatic ductal dilatation or surrounding inflammatory changes. Spleen: Normal in size. No concerning splenic lesions. Adrenals/Urinary Tract: Normal adrenals. Bilateral fluid attenuation parapelvic renal cysts. No concerning focal renal lesion. Mild cortical thinning and scarring bilaterally. Nonspecific bilateral perinephric stranding. Mild left urothelial thickening. No hydronephrosis. Mild circumferential bladder wall thickening. Stomach/Bowel: Transesophageal feeding tube, as above. Stomach and duodenum are otherwise unremarkable. No small bowel thickening or dilatation. Fluid-filled appearance of the colon with lack of formed stool, may reflect a rapid transit state. Scattered colonic diverticula without focal inflammation to suggest diverticulitis. Redemonstration of the ventral midline hernia containing portion of colon. No evidence of mechanical or vascular compromise of the bowel at this time. No other sites of bowel obstruction. Appendix is not visualized. No focal inflammation the vicinity of the cecum to suggest an occult appendicitis. Vascular/Lymphatic: Atherosclerotic calcifications within the abdominal aorta and branch vessels. No aneurysm or ectasia. No  enlarged abdominopelvic lymph nodes. Reproductive: Surgical changes noted at the prostate bed. No gross abnormality at the site of prior prostatectomy. Chronic presacral fat stranding. Other: Complex multilobulated ventral hernia containing portion of the transverse colon. Some stranding of the herniated fat contents both within and external to the hernia sac could reflect some fatty strangulation (7/105). Correlate for point tenderness. No evidence of vascular compromise or mechanical obstruction of the herniated bowel loops. No other bowel containing hernia. Mild body wall edema most pronounced towards the flanks, left greater than right. No free abdominopelvic air or fluid. Chronic stranding in the presacral space. Musculoskeletal: The osseous structures appear diffusely demineralized which may limit detection of small or nondisplaced fractures. No acute osseous abnormality or suspicious osseous lesion. Multilevel degenerative changes are present in the imaged portions of the spine. Additional mild degenerative changes in the bilateral hips. IMPRESSION: 1. Scattered secretions noted in the central trachea and right mainstem bronchus. Lungs remain otherwise clear. 2. Diffusely fluid-filled appearance of the colon with lack of formed stool. Could reflect diarrheal illness/rapid transit state. 3. Bilateral perinephric stranding with some urothelial thickening on the left and circumferential bladder wall thickening. Could reflect a cystitis and ascending tract infection in the appropriate clinical setting. Correlate with urinalysis. 4. Complex multilobulated ventral hernia containing portion of the transverse colon. No evidence of vascular compromise or bowel obstruction though some stranding of the herniated fat contents could reflect fat strangulation. Correlate for point tenderness. 5. Surgical changes from prior prostatectomy. No gross interval change from comparison PET-CT. 6.  Coronary artery calcifications. 7.  Hypoattenuation of the blood pool suggestive of anemia. Correlate with CBC. 8.  Aortic Atherosclerosis (ICD10-I70.0). Electronically Signed   By: Lovena Le M.D.   On: 06/03/2021 20:36   MR ANGIO HEAD WO CONTRAST  Result Date: 05/29/2021 CLINICAL DATA:  Intracranial hemorrhage, code stroke follow-up EXAM: MRI HEAD WITHOUT AND WITH CONTRAST MRA HEAD WITHOUT CONTRAST MRA NECK WITHOUT CONTRAST TECHNIQUE: Multiplanar, multiecho pulse sequences of the brain and surrounding structures were obtained without and with intravenous contrast. Angiographic images of the Circle of Willis were obtained using MRA technique without intravenous contrast. Angiographic images of the neck were obtained using MRA technique without intravenous contrast. Carotid stenosis measurements (when applicable) are obtained utilizing NASCET criteria, using the distal internal carotid diameter as the denominator. CONTRAST:  41mL GADAVIST GADOBUTROL 1 MMOL/ML IV SOLN COMPARISON:  05/05/2021, 04/30/2021 MRA neck FINDINGS: MRI HEAD Brain: Acute right thalamic hemorrhage seen on recent CT imaging is again identified with surrounding edema. Intraventricular extension is again noted. There is effacement of the third ventricle. Similar ventricle caliber with hydrocephalus. No abnormal enhancement to suggest underlying lesion. Two small foci of reduced diffusion in the inferior right parietal lobe. Stable findings of chronic microvascular ischemic changes. Multiple chronic small vessel infarcts are again identified with involvement of central cerebral white matter, deep gray nuclei, and pons. Numerous foci of susceptibility hypointensity are also again noted in the cerebral white matter, basal ganglia, thalami, and cerebellum consistent with chronic microhemorrhages in a distribution suggesting hypertension as the etiology. There is no abnormal enhancement. Vascular: Major vessel flow voids at the skull base are preserved. Skull and upper cervical spine:  Normal marrow signal is preserved. Sinuses/Orbits: Minor mucosal thickening.  Orbits are unremarkable. Other: Sella is unremarkable.  Mastoid air cells are clear. MRA HEAD Intracranial internal carotid arteries are patent. Middle and anterior cerebral arteries are patent. Intracranial vertebral arteries, basilar artery, posterior cerebral arteries are patent. Bilateral posterior communicating arteries are present. There is no significant stenosis or aneurysm. MRA NECK Common, internal, and external carotid arteries are patent. Codominant vertebral arteries are patent. There is focal moderate narrowing of the right V2 vertebral artery due to osteophytic spurring. No hemodynamically significant stenosis. IMPRESSION: Two small acute infarcts of the inferior right parietal lobe. Acute right thalamic hemorrhage with intraventricular extension, mild mass effect, and hydrocephalus similar to recent CT imaging. Likely hypertensive in etiology with multiple prior chronic microhemorrhage is noted. Stable vascular imaging. Electronically Signed   By: Macy Mis M.D.   On: 05/29/2021 14:15   MR ANGIO NECK WO CONTRAST  Result Date: 05/29/2021 CLINICAL DATA:  Intracranial hemorrhage, code stroke follow-up EXAM: MRI HEAD WITHOUT AND WITH CONTRAST MRA HEAD WITHOUT CONTRAST MRA NECK WITHOUT CONTRAST TECHNIQUE: Multiplanar, multiecho pulse sequences of the brain and surrounding structures were obtained without and with intravenous contrast. Angiographic images of the Circle of Willis were obtained using MRA technique without intravenous contrast. Angiographic images of the neck were obtained using MRA technique without intravenous contrast. Carotid stenosis measurements (when applicable) are obtained utilizing NASCET criteria, using the distal internal carotid diameter as the denominator. CONTRAST:  11mL GADAVIST GADOBUTROL 1 MMOL/ML IV SOLN COMPARISON:  05/05/2021, 04/30/2021 MRA neck FINDINGS: MRI HEAD Brain: Acute right  thalamic hemorrhage seen on recent CT imaging is again identified with surrounding edema. Intraventricular extension is again noted. There is effacement of the third ventricle. Similar ventricle caliber with hydrocephalus. No abnormal enhancement to suggest underlying lesion. Two small foci of reduced diffusion in  the inferior right parietal lobe. Stable findings of chronic microvascular ischemic changes. Multiple chronic small vessel infarcts are again identified with involvement of central cerebral white matter, deep gray nuclei, and pons. Numerous foci of susceptibility hypointensity are also again noted in the cerebral white matter, basal ganglia, thalami, and cerebellum consistent with chronic microhemorrhages in a distribution suggesting hypertension as the etiology. There is no abnormal enhancement. Vascular: Major vessel flow voids at the skull base are preserved. Skull and upper cervical spine: Normal marrow signal is preserved. Sinuses/Orbits: Minor mucosal thickening.  Orbits are unremarkable. Other: Sella is unremarkable.  Mastoid air cells are clear. MRA HEAD Intracranial internal carotid arteries are patent. Middle and anterior cerebral arteries are patent. Intracranial vertebral arteries, basilar artery, posterior cerebral arteries are patent. Bilateral posterior communicating arteries are present. There is no significant stenosis or aneurysm. MRA NECK Common, internal, and external carotid arteries are patent. Codominant vertebral arteries are patent. There is focal moderate narrowing of the right V2 vertebral artery due to osteophytic spurring. No hemodynamically significant stenosis. IMPRESSION: Two small acute infarcts of the inferior right parietal lobe. Acute right thalamic hemorrhage with intraventricular extension, mild mass effect, and hydrocephalus similar to recent CT imaging. Likely hypertensive in etiology with multiple prior chronic microhemorrhage is noted. Stable vascular imaging.  Electronically Signed   By: Macy Mis M.D.   On: 05/29/2021 14:15   MR BRAIN W WO CONTRAST  Result Date: 05/29/2021 CLINICAL DATA:  Intracranial hemorrhage, code stroke follow-up EXAM: MRI HEAD WITHOUT AND WITH CONTRAST MRA HEAD WITHOUT CONTRAST MRA NECK WITHOUT CONTRAST TECHNIQUE: Multiplanar, multiecho pulse sequences of the brain and surrounding structures were obtained without and with intravenous contrast. Angiographic images of the Circle of Willis were obtained using MRA technique without intravenous contrast. Angiographic images of the neck were obtained using MRA technique without intravenous contrast. Carotid stenosis measurements (when applicable) are obtained utilizing NASCET criteria, using the distal internal carotid diameter as the denominator. CONTRAST:  15mL GADAVIST GADOBUTROL 1 MMOL/ML IV SOLN COMPARISON:  05/05/2021, 04/30/2021 MRA neck FINDINGS: MRI HEAD Brain: Acute right thalamic hemorrhage seen on recent CT imaging is again identified with surrounding edema. Intraventricular extension is again noted. There is effacement of the third ventricle. Similar ventricle caliber with hydrocephalus. No abnormal enhancement to suggest underlying lesion. Two small foci of reduced diffusion in the inferior right parietal lobe. Stable findings of chronic microvascular ischemic changes. Multiple chronic small vessel infarcts are again identified with involvement of central cerebral white matter, deep gray nuclei, and pons. Numerous foci of susceptibility hypointensity are also again noted in the cerebral white matter, basal ganglia, thalami, and cerebellum consistent with chronic microhemorrhages in a distribution suggesting hypertension as the etiology. There is no abnormal enhancement. Vascular: Major vessel flow voids at the skull base are preserved. Skull and upper cervical spine: Normal marrow signal is preserved. Sinuses/Orbits: Minor mucosal thickening.  Orbits are unremarkable. Other: Sella is  unremarkable.  Mastoid air cells are clear. MRA HEAD Intracranial internal carotid arteries are patent. Middle and anterior cerebral arteries are patent. Intracranial vertebral arteries, basilar artery, posterior cerebral arteries are patent. Bilateral posterior communicating arteries are present. There is no significant stenosis or aneurysm. MRA NECK Common, internal, and external carotid arteries are patent. Codominant vertebral arteries are patent. There is focal moderate narrowing of the right V2 vertebral artery due to osteophytic spurring. No hemodynamically significant stenosis. IMPRESSION: Two small acute infarcts of the inferior right parietal lobe. Acute right thalamic hemorrhage with intraventricular extension, mild mass  effect, and hydrocephalus similar to recent CT imaging. Likely hypertensive in etiology with multiple prior chronic microhemorrhage is noted. Stable vascular imaging. Electronically Signed   By: Macy Mis M.D.   On: 05/29/2021 14:15   DG Chest Port 1 View  Result Date: 06/06/2021 CLINICAL DATA:  70 year old male with shortness of breath. Intracranial hemorrhage. EXAM: PORTABLE CHEST 1 VIEW COMPARISON:  CT Chest, Abdomen, and Pelvis today are reported separately. 06/03/2021 and earlier. FINDINGS: Portable AP upright view at 0826 hours. Enteric feeding tube courses to the abdomen, tip not included. Lung volumes, mediastinal contours are stable and within normal limits. Allowing for portable technique the lungs are clear. No pneumothorax. No acute osseous abnormality identified. Negative visible bowel gas pattern. IMPRESSION: Enteric tube remains in place. No acute cardiopulmonary abnormality. Electronically Signed   By: Genevie Ann M.D.   On: 06/06/2021 08:33   DG Chest Port 1 View  Result Date: 06/02/2021 CLINICAL DATA:  Short of breath EXAM: PORTABLE CHEST 1 VIEW COMPARISON:  None. FINDINGS: Feeding tube extends the stomach. Lungs are clear. No pneumonia, infiltrate pneumothorax  IMPRESSION: Lungs are clear Electronically Signed   By: Suzy Bouchard M.D.   On: 06/02/2021 09:06   DG Chest Port 1V same Day  Result Date: 06/01/2021 CLINICAL DATA:  Shortness of breath. EXAM: PORTABLE CHEST 1 VIEW COMPARISON:  May 05, 2021. FINDINGS: The heart size and mediastinal contours are within normal limits. Both lungs are clear. Feeding tube is seen entering stomach. The visualized skeletal structures are unremarkable. IMPRESSION: No active disease. Electronically Signed   By: Marijo Conception M.D.   On: 06/01/2021 11:23   DG Abd Portable 1V  Result Date: 05/30/2021 CLINICAL DATA:  Feeding tube placement. EXAM: PORTABLE ABDOMEN - 1 VIEW COMPARISON:  None. FINDINGS: The bowel gas pattern is normal. Distal tip of feeding tube is seen in expected position of distal stomach. No radio-opaque calculi or other significant radiographic abnormality are seen. IMPRESSION: Distal tip of feeding tube seen in expected position of distal stomach. Electronically Signed   By: Marijo Conception M.D.   On: 05/30/2021 15:32   EEG adult  Result Date: 05/08/2021 Lora Havens, MD     05/08/2021  6:10 PM Patient Name: Casey Greene MRN: [id number] Epilepsy Attending: Lora Havens Referring Physician/Provider: Clance Boll, NP Date: 05/08/2021 Duration: 23.07 mins Patient history: 70 yo male who presented to [facility] yesterday after a 45 min to 1 hr episode of confusion while driving. EEG to evaluate for seizure Level of alertness: Awake AEDs during EEG study: None Technical aspects: This EEG study was done with scalp electrodes positioned according to the 10-20 International system of electrode placement. Electrical activity was acquired at a sampling rate of 500Hz  and reviewed with a high frequency filter of 70Hz  and a low frequency filter of 1Hz . EEG data were recorded continuously and digitally stored. Description: The posterior dominant rhythm consists of 9 Hz activity of moderate voltage (25-35 uV) seen  predominantly in posterior head regions, symmetric and reactive to eye opening and eye closing. EEG showed intermittent left>right temporal independent 3 to 6 Hz theta-delta slowing. Hyperventilation and photic stimulation were not performed.   ABNORMALITY - Intermittent slow, left>right temporal region IMPRESSION: This study is suggestive of non specific cortical dysfunction arising from left> right temporal region.  No seizures or epileptiform discharges were seen throughout the recording. Lora Havens   ECHOCARDIOGRAM COMPLETE  Result Date: 05/07/2021    ECHOCARDIOGRAM REPORT   Patient  Name:   TAYLOR LEVICK Date of Exam: 05/07/2021 Medical Rec #:  [id number]     Height:       70.0 in Accession #:    [id number]    Weight:       168.0 lb Date of Birth:  1951-05-13     BSA:          1.938 m Patient Age:    49 years      BP:           157/90 mmHg Patient Gender: M             HR:           59 bpm. Exam Location:  Inpatient Procedure: 2D Echo, Cardiac Doppler and Color Doppler Indications:    Stroke I63.9  History:        Patient has prior history of Echocardiogram examinations, most                 recent 07/29/2019. CHF, Stroke; Risk Factors:Hypertension and                 Dyslipidemia. CKD.  Sonographer:    Jonelle Sidle Dance Referring Phys: [place]  1. Left ventricular ejection fraction, by estimation, is 65 to 70%. The left ventricle has normal function. The left ventricle has no regional wall motion abnormalities. There is mild concentric left ventricular hypertrophy. Left ventricular diastolic parameters are consistent with Grade II diastolic dysfunction (pseudonormalization). Elevated left ventricular end-diastolic pressure.  2. Right ventricular systolic function is normal. The right ventricular size is normal. Tricuspid regurgitation signal is inadequate for assessing PA pressure.  3. Left atrial size was severely dilated.  4. There is a small bright target on the anterior MV  leaflet tip that likely represents calcific MV disease. . The mitral valve is degenerative. Mild mitral valve regurgitation. No evidence of mitral stenosis.  5. The aortic valve is tricuspid. Aortic valve regurgitation is not visualized. Mild aortic valve sclerosis is present, with no evidence of aortic valve stenosis.  6. The inferior vena cava is dilated in size with >50% respiratory variability, suggesting right atrial pressure of 8 mmHg. FINDINGS  Left Ventricle: Left ventricular ejection fraction, by estimation, is 65 to 70%. The left ventricle has normal function. The left ventricle has no regional wall motion abnormalities. The left ventricular internal cavity size was normal in size. There is  mild concentric left ventricular hypertrophy. Left ventricular diastolic parameters are consistent with Grade II diastolic dysfunction (pseudonormalization). Elevated left ventricular end-diastolic pressure. Right Ventricle: The right ventricular size is normal. No increase in right ventricular wall thickness. Right ventricular systolic function is normal. Tricuspid regurgitation signal is inadequate for assessing PA pressure. Left Atrium: Left atrial size was severely dilated. Right Atrium: Right atrial size was normal in size. Pericardium: There is no evidence of pericardial effusion. Mitral Valve: There is a small bright target on the anterior MV leaflet tip that likely represents calcific MV disease. The mitral valve is degenerative in appearance. There is mild calcification of the anterior mitral valve leaflet(s). Mild mitral valve  regurgitation. No evidence of mitral valve stenosis. Tricuspid Valve: The tricuspid valve is normal in structure. Tricuspid valve regurgitation is trivial. No evidence of tricuspid stenosis. Aortic Valve: The aortic valve is tricuspid. Aortic valve regurgitation is not visualized. Mild aortic valve sclerosis is present, with no evidence of aortic valve stenosis. Pulmonic Valve: The  pulmonic valve was normal in structure. Pulmonic valve regurgitation is  not visualized. No evidence of pulmonic stenosis. Aorta: The aortic root is normal in size and structure. Venous: The inferior vena cava is dilated in size with greater than 50% respiratory variability, suggesting right atrial pressure of 8 mmHg. IAS/Shunts: No atrial level shunt detected by color flow Doppler.  LEFT VENTRICLE PLAX 2D LVIDd:         4.60 cm  Diastology LVIDs:         3.00 cm  LV e' medial:    6.20 cm/s LV PW:         1.50 cm  LV E/e' medial:  15.1 LV IVS:        1.30 cm  LV e' lateral:   6.42 cm/s LVOT diam:     2.00 cm  LV E/e' lateral: 14.6 LV SV:         69 LV SV Index:   35 LVOT Area:     3.14 cm  RIGHT VENTRICLE             IVC RV Basal diam:  2.90 cm     IVC diam: 2.10 cm RV S prime:     13.70 cm/s TAPSE (M-mode): 2.4 cm LEFT ATRIUM              Index       RIGHT ATRIUM           Index LA diam:        3.30 cm  1.70 cm/m  RA Area:     21.20 cm LA Vol (A2C):   104.0 ml 53.66 ml/m RA Volume:   57.30 ml  29.56 ml/m LA Vol (A4C):   69.0 ml  35.60 ml/m LA Biplane Vol: 94.5 ml  48.76 ml/m  AORTIC VALVE LVOT Vmax:   87.80 cm/s LVOT Vmean:  59.100 cm/s LVOT VTI:    0.218 m  AORTA Ao Root diam: 3.60 cm Ao Asc diam:  3.40 cm MITRAL VALVE MV Area (PHT): 3.85 cm    SHUNTS MV Decel Time: 197 msec    Systemic VTI:  0.22 m MV E velocity: 93.60 cm/s  Systemic Diam: 2.00 cm MV A velocity: 70.40 cm/s MV E/A ratio:  1.33 Fransico Him MD Electronically signed by Fransico Him MD Signature Date/Time: 05/07/2021/3:30:59 PM    Final    CT HEAD CODE STROKE WO CONTRAST  Result Date: 05/28/2021 CLINICAL DATA:  Code stroke.  Left-sided weakness EXAM: CT HEAD WITHOUT CONTRAST TECHNIQUE: Contiguous axial images were obtained from the base of the skull through the vertex without intravenous contrast. COMPARISON:  05/05/2021 FINDINGS: Motion artifact is present. Brain: There is hemorrhage centered within the right thalamus measuring  approximately 2.2 x 2.2 x 2.1 cm. Intraventricular extension is present with hemorrhage seen within the right lateral ventricle, third ventricle, cerebral aqueduct, and fourth ventricle. Ventricles are enlarged compared to the recent prior study reflecting hydrocephalus. No definite new loss of gray-white differentiation. Patchy and confluent areas of low-attenuation in the supratentorial white matter nonspecific but probably reflects stable chronic microvascular ischemic changes. No extra-axial collection. Prominence of the ventricles and sulci reflects generalized parenchymal volume loss. Vascular: No hyperdense vessel. Skull: Unremarkable Sinuses/Orbits: No acute abnormality. Other: Mastoid air cells are clear. IMPRESSION: Acute right thalamic hemorrhage with intraventricular extension and hydrocephalus. Likely hypertensive etiology, noting findings on prior brain MRI. These results were communicated to Dr. Rory Percy at 5:49 pm on 05/28/2021 by text page via the [facility] messaging system. Electronically Signed   By: Addison Lank.D.  On: 05/28/2021 17:52    Labs:  CBC: Recent Labs    06/03/21 1946 06/04/21 0241 06/05/21 0421 06/06/21 0327  WBC 10.5 11.1* 8.7 9.9  HGB 9.5* 9.3* 8.5* 9.1*  HCT 29.5* 28.7* 26.9* 28.5*  PLT 167 169 161 180    COAGS: Recent Labs    05/05/21 1441 05/28/21 1752  INR 1.0 1.1  APTT 31 27    BMP: Recent Labs    06/03/21 0106 06/04/21 0241 06/05/21 0421 06/06/21 0327  NA 148* 149* 146* 147*  K 3.6 3.7 3.7 3.7  CL 113* 113* 109 114*  CO2 27 26 27 26   GLUCOSE 143* 149* 162* 147*  BUN 62* 67* 83* 81*  CALCIUM 11.1* 11.5* 11.2* 11.1*  CREATININE 2.56* 2.47* 2.98* 2.72*  GFRNONAA 26* 27* 22* 24*    LIVER FUNCTION TESTS: Recent Labs    06/02/21 0259 06/03/21 0106 06/04/21 0241 06/05/21 0421  BILITOT 0.8 0.7 0.9 0.5  AST 34 31 28 23   ALT 13 13 12 11   ALKPHOS 37* 38 39 31*  PROT 6.8 7.0 6.8 6.2*  ALBUMIN 3.1* 3.1* 3.0* 2.7*    TUMOR MARKERS: No  results for input(s): AFPTM, CEA, CA199, CHROMGRNA in the last 8760 hours.  Assessment and Plan: 71 y.o. male with past medical history significant for anemia, chronic kidney disease, congestive heart failure, dysrhythmia, hypertension, hepatitis C treated with antiviral therapy, gout, hyperlipidemia, osteoarthritis, left ventricular hypertrophy, depression, mild neurocognitive disorder , prostate cancer 1997 with prior prostatectomy, peripheral vascular disease, stroke in January of this year who recently presented with left-sided hemiplegia and was found to have acute right thalamic hemorrhage.  He was on Plavix as outpatient and currently on subcu heparin.  He is currently receiving tube feeds via NG and has dysphagia.  He continues to have some intermittent fevers.  Current labs include WBC 9.9, hemoglobin 9.1, platelets 180K, creatinine 2.72, PT/INR normal, COVID-19 negative, latest blood culture pending.  Chest x-ray with no acute cardiopulmonary abnormality.  CT head today with no new intracranial abnormality.  Patient remains lethargic.  He is currently on IV Unasyn.  ID has also been consulted.  Request now received from primary care team for gastrostomy tube placement.  Imaging studies have been reviewed by Dr. Earleen Newport. Risks and benefits image guided gastrostomy tube placement was discussed with the patient's spouse Nhan Qualley including, but not limited to the need for a barium enema during the procedure, bleeding, infection, peritonitis and/or damage to adjacent structures.  All of the patient's questions were answered, patient is agreeable to proceed.  Consent signed and in chart.  We will need to continue monitoring patient's fever curve to ensure stability (afebrile state)before finalizing plans for G tube placement    Thank you for this interesting consult.  I greatly enjoyed meeting Jaylin Benzel and look forward to participating in their care.  A copy of this report was sent to the  requesting provider on this date.  Electronically Signed: D. Rowe Robert, [name] 06/06/2021, 11:30 AM   I spent a total of   30 minutes  in face to face in clinical consultation, greater than 50% of which was counseling/coordinating care for percutaneous gastrostomy tube placement.

## 2021-06-07 ENCOUNTER — Inpatient Hospital Stay (HOSPITAL_COMMUNITY): Payer: Medicare Other

## 2021-06-07 DIAGNOSIS — E43 Unspecified severe protein-calorie malnutrition: Secondary | ICD-10-CM | POA: Diagnosis not present

## 2021-06-07 DIAGNOSIS — T17908D Unspecified foreign body in respiratory tract, part unspecified causing other injury, subsequent encounter: Secondary | ICD-10-CM

## 2021-06-07 DIAGNOSIS — G9341 Metabolic encephalopathy: Secondary | ICD-10-CM

## 2021-06-07 DIAGNOSIS — I610 Nontraumatic intracerebral hemorrhage in hemisphere, subcortical: Secondary | ICD-10-CM | POA: Diagnosis not present

## 2021-06-07 DIAGNOSIS — R509 Fever, unspecified: Secondary | ICD-10-CM

## 2021-06-07 DIAGNOSIS — Z7189 Other specified counseling: Secondary | ICD-10-CM | POA: Diagnosis not present

## 2021-06-07 DIAGNOSIS — T17908A Unspecified foreign body in respiratory tract, part unspecified causing other injury, initial encounter: Secondary | ICD-10-CM

## 2021-06-07 DIAGNOSIS — Z515 Encounter for palliative care: Secondary | ICD-10-CM | POA: Diagnosis not present

## 2021-06-07 LAB — GLUCOSE, CAPILLARY
Glucose-Capillary: 142 mg/dL — ABNORMAL HIGH (ref 70–99)
Glucose-Capillary: 145 mg/dL — ABNORMAL HIGH (ref 70–99)
Glucose-Capillary: 151 mg/dL — ABNORMAL HIGH (ref 70–99)
Glucose-Capillary: 157 mg/dL — ABNORMAL HIGH (ref 70–99)
Glucose-Capillary: 157 mg/dL — ABNORMAL HIGH (ref 70–99)
Glucose-Capillary: 161 mg/dL — ABNORMAL HIGH (ref 70–99)

## 2021-06-07 LAB — BASIC METABOLIC PANEL
Anion gap: 5 (ref 5–15)
BUN: 83 mg/dL — ABNORMAL HIGH (ref 8–23)
CO2: 27 mmol/L (ref 22–32)
Calcium: 11.2 mg/dL — ABNORMAL HIGH (ref 8.9–10.3)
Chloride: 116 mmol/L — ABNORMAL HIGH (ref 98–111)
Creatinine, Ser: 2.76 mg/dL — ABNORMAL HIGH (ref 0.61–1.24)
GFR, Estimated: 24 mL/min — ABNORMAL LOW (ref >60)
Glucose, Bld: 148 mg/dL — ABNORMAL HIGH (ref 70–99)
Potassium: 3.7 mmol/L (ref 3.5–5.1)
Sodium: 148 mmol/L — ABNORMAL HIGH (ref 135–145)

## 2021-06-07 LAB — CBC
HCT: 27.8 % — ABNORMAL LOW (ref 39.0–52.0)
Hemoglobin: 8.9 g/dL — ABNORMAL LOW (ref 13.0–17.0)
MCH: 31.7 pg (ref 26.0–34.0)
MCHC: 32 g/dL (ref 30.0–36.0)
MCV: 98.9 fL (ref 80.0–100.0)
Platelets: 191 10*3/uL (ref 150–400)
RBC: 2.81 MIL/uL — ABNORMAL LOW (ref 4.22–5.81)
RDW: 13.2 % (ref 11.5–15.5)
WBC: 10.2 10*3/uL (ref 4.0–10.5)
nRBC: 0 % (ref 0.0–0.2)

## 2021-06-07 LAB — PROTIME-INR
INR: 1 (ref 0.8–1.2)
Prothrombin Time: 13.5 seconds (ref 11.4–15.2)

## 2021-06-07 MED ORDER — DEXTROSE 5 % IV SOLN: INTRAVENOUS | Status: DC

## 2021-06-07 MED ORDER — IPRATROPIUM BROMIDE 0.02 % IN SOLN: 0.5000 mg | Freq: Four times a day (QID) | RESPIRATORY_TRACT | Status: DC

## 2021-06-07 MED ORDER — LEVALBUTEROL HCL 0.63 MG/3ML IN NEBU: 0.6300 mg | INHALATION_SOLUTION | Freq: Four times a day (QID) | RESPIRATORY_TRACT | Status: DC

## 2021-06-07 MED ORDER — LEVALBUTEROL HCL 0.63 MG/3ML IN NEBU: 0.6300 mg | INHALATION_SOLUTION | Freq: Four times a day (QID) | RESPIRATORY_TRACT | Status: DC | PRN

## 2021-06-07 NOTE — Progress Notes (Signed)
STROKE TEAM PROGRESS NOTE   INTERVAL HISTORY RN is at bedside.  Patient lying in bed, lethargic, barely open eyes on voice, only able to follow commands with right finger grip.  Intermittent coughing with difficulty handling secretions.  Palliative care on board to discuss possible needs of PEG.  MRI no acute change and EEG no seizure.  Vitals:   06/07/21 0635 06/07/21 0810 06/07/21 1132 06/07/21 1600  BP:  127/79 115/67 (!) 144/77  Pulse:  86 75 79  Resp:  18 20 19   Temp:  (!) 100.4 F (38 C) 100.3 F (37.9 C) 97.7 F (36.5 C)  TempSrc:  Axillary Axillary Axillary  SpO2:  93% 100% 100%  Weight: 71.8 kg     Height:       CBC:  Recent Labs  Lab 06/06/21 0327 06/07/21 0342  WBC 9.9 10.2  HGB 9.1* 8.9*  HCT 28.5* 27.8*  MCV 98.6 98.9  PLT 180 741   Basic Metabolic Panel:  Recent Labs  Lab 06/01/21 0825 06/02/21 0259 06/03/21 0106 06/06/21 0327 06/07/21 0342  NA 145 146*   < > 147* 148*  K 3.9 3.8   < > 3.7 3.7  CL 111 113*   < > 114* 116*  CO2 27 23   < > 26 27  GLUCOSE 123* 147*   < > 147* 148*  BUN 43* 55*   < > 81* 83*  CREATININE 2.11* 2.58*   < > 2.72* 2.76*  CALCIUM 10.5* 10.8*   < > 11.1* 11.2*  MG 1.7 2.1  --   --   --    < > = values in this interval not displayed.   Lipid Panel:  No results for input(s): CHOL, TRIG, HDL, CHOLHDL, VLDL, LDLCALC in the last 168 hours.  HgbA1c:  No results for input(s): HGBA1C in the last 168 hours.   IMAGING past 24 hours MR BRAIN WO CONTRAST  Result Date: 06/06/2021 CLINICAL DATA:  Stroke follow-up EXAM: MRI HEAD WITHOUT CONTRAST TECHNIQUE: Multiplanar, multiecho pulse sequences of the brain and surrounding structures were obtained without intravenous contrast. COMPARISON:  05/29/2021 FINDINGS: Brain: Slightly decreased size of intraparenchymal hematoma centered in the right thalamus. Persistent intraventricular extension of blood products. There are numerous chronic microhemorrhages in a predominantly central  distribution. There is mild edema surrounding the hemorrhage. Early confluent hyperintense T2-weighted signal of the periventricular and deep white matter, most commonly due to chronic ischemic microangiopathy. Mild volume loss. Old small vessel infarct of the left pons. The midline structures are normal. Vascular: Major flow voids are preserved. Skull and upper cervical spine: Normal calvarium and skull base. Visualized upper cervical spine and soft tissues are normal. Sinuses/Orbits:No paranasal sinus fluid levels or advanced mucosal thickening. No mastoid or middle ear effusion. Normal orbits. IMPRESSION: 1. Slightly decreased size of right thalamic intraparenchymal hematoma with intraventricular extension. 2. Numerous chronic microhemorrhages in a predominantly central distribution, consistent with chronic hypertensive angiopathy. 3. Chronic ischemic microangiopathy. Electronically Signed   By: Ulyses Jarred M.D.   On: 06/06/2021 22:00   DG CHEST PORT 1 VIEW  Result Date: 06/07/2021 CLINICAL DATA:  Shortness of breath EXAM: PORTABLE CHEST 1 VIEW COMPARISON:  06/06/2021 FINDINGS: Feeding tube coursing below the diaphragm with the tip excluded from the field of view. No focal consolidation. No pleural effusion or pneumothorax. Heart and mediastinal contours are unremarkable. No acute osseous abnormality. IMPRESSION: No acute cardiopulmonary disease. Electronically Signed   By: Kathreen Devoid   On: 06/07/2021 13:02  VAS [place] LOWER EXTREMITY VENOUS (DVT)  Result Date: 06/07/2021  Lower Venous DVT Study Patient Name:  Casey Greene  Date of Exam:   06/07/2021 Medical Rec #: [id number]      Accession #:    [id number] Date of Birth: [birth year]      Patient Gender: M Patient Age:   70Y Exam Location:  [facility] Procedure:      VAS [place] LOWER EXTREMITY VENOUS (DVT) Referring Phys: [id number] [facility] --------------------------------------------------------------------------------  Indications: Edema.   Comparison Study: No prior study Performing Technologist: Maudry Mayhew MHA, RDMS, RVT, RDCS  Examination Guidelines: A complete evaluation includes B-mode imaging, spectral Doppler, color Doppler, and power Doppler as needed of all accessible portions of each vessel. Bilateral testing is considered an integral part of a complete examination. Limited examinations for reoccurring indications may be performed as noted. The reflux portion of the exam is performed with the patient in reverse Trendelenburg.  +---------+---------------+---------+-----------+----------+--------------+ RIGHT    CompressibilityPhasicitySpontaneityPropertiesThrombus Aging +---------+---------------+---------+-----------+----------+--------------+ CFV      Full           Yes      Yes                                 +---------+---------------+---------+-----------+----------+--------------+ SFJ      Full                                                        +---------+---------------+---------+-----------+----------+--------------+ FV Prox  Full                                                        +---------+---------------+---------+-----------+----------+--------------+ FV Mid   Full                                                        +---------+---------------+---------+-----------+----------+--------------+ FV DistalFull                                                        +---------+---------------+---------+-----------+----------+--------------+ PFV      Full                                                        +---------+---------------+---------+-----------+----------+--------------+ POP      Full           Yes      Yes                                 +---------+---------------+---------+-----------+----------+--------------+ PTV      Full                                                         +---------+---------------+---------+-----------+----------+--------------+  PERO     Full                                                        +---------+---------------+---------+-----------+----------+--------------+   +---------+---------------+---------+-----------+----------+--------------+ LEFT     CompressibilityPhasicitySpontaneityPropertiesThrombus Aging +---------+---------------+---------+-----------+----------+--------------+ CFV      Full           Yes      Yes                                 +---------+---------------+---------+-----------+----------+--------------+ SFJ      Full                                                        +---------+---------------+---------+-----------+----------+--------------+ FV Prox  Full                                                        +---------+---------------+---------+-----------+----------+--------------+ FV Mid   Full                                                        +---------+---------------+---------+-----------+----------+--------------+ FV DistalFull                                                        +---------+---------------+---------+-----------+----------+--------------+ PFV      Full                                                        +---------+---------------+---------+-----------+----------+--------------+ POP      Full           Yes      Yes                                 +---------+---------------+---------+-----------+----------+--------------+ PTV      Full                                                        +---------+---------------+---------+-----------+----------+--------------+ PERO     Full                                                        +---------+---------------+---------+-----------+----------+--------------+  Summary: RIGHT: - There is no evidence of deep vein thrombosis in the lower extremity.  - No cystic structure found in  the popliteal fossa.  LEFT: - There is no evidence of deep vein thrombosis in the lower extremity.  - No cystic structure found in the popliteal fossa.  *See table(s) above for measurements and observations.    Preliminary     PHYSICAL EXAM  Temp:  [97.7 F (36.5 C)-100.7 F (38.2 C)] 97.7 F (36.5 C) (07/13 1600) Pulse Rate:  [75-97] 79 (07/13 1600) Resp:  [15-20] 19 (07/13 1600) BP: (115-163)/(67-94) 144/77 (07/13 1600) SpO2:  [93 %-100 %] 100 % (07/13 1600) Weight:  [71.8 kg] 71.8 kg (07/13 0635)  General - Well nourished, well developed, drowsy sleepy and lethargic  Ophthalmologic - fundi not visualized due to noncooperation.  Cardiovascular - Regular rhythm and rate.  Neuro - lethargic, drowsy and sleepy, barely open eyes with voice, not answer orientation questions, nonverbal but moaning.  Only able to follow commands of right hand grip.  Right gaze preference, barely cross midline, not blinking to visual threat bilaterally, PERRL.  Left facial droop. Tongue protrusion not corporative.  Left upper and lower extremity flaccid, right upper extremity bicep 3-5, finger grip 3/5.  Right lower extremity mild withdraw on cancellation. Sensation, coordination and gait not tested.   ASSESSMENT/PLAN [name] is a 70 y.o. male with history of previous strokes, HTN, HLD, mild neurocognitive disorder, PVD, chronic diastolic heart failure who presented to the ED as a code stroke for evaluation of acute left upper and lower extremity weakness; noticed by family to be dragging his left foot while assisting him to bed. Initial ICH score: 1  ICH:  Right thalamic ICH with IVH secondary to HTN, with mild cytotoxic edema and obstructive hydrocephalus Code Stroke CT head show ICH/IVH as above MRI  acute right thalamic ICH with surrounding edema, IVH extension, mild effacement of 3rd ventricale, stable mild hydrocephalus. Noted 2 other small foci of DWI in right parietal lobe. Evidence of  numerous old strokes and microhemorrhages that appear HTN as etiology. MRA head and neck no significant change or stenosis CT repeat 06/03/2021 decreasing right thalamic and intraventricular hemorrhage. Ventriculomegaly has resolved. MRI repeat 06/06/2021 decreased size of right thalamic ICH with IVH, chronic hypertensive ischemic microangiopathy 2D Echo 05/07/21 - EF 65-70% EF, mild LVH, severe dilation of LA LDL 50 HgbA1c 5.1 VTE prophylaxis - heparin subq ASA 81mg  + clopidogrel 75 mg daily prior to admission, now on none d/t ICH Therapy recommendations:  CIR Disposition:  pending  Metabolic encephalopathy Decreased mentation for the last several days Likely due to fever, aspiration pneumonia, hypernatremia Right ICH improving from neuro imaging Tmax 102.4->100.6 WBC 11.1-9.9-10.2 Sodium 148-149-147-148 EEG no seizure, but moderate diffuse encephalopathy On Unasyn and tube feeding with free water  Hypertensive emergency Home meds:  Coreg, apresoline, cozaar Off Clevaprex gtt Now on coreg, hydralazine, losartan Long-term BP goal normotensive  Hyperlipidemia Home meds:  Lipitor 40mg  LDL 50, goal < 70 Continue home statin at discharge  Fever  Tmax 102.1->100.8->102.5->100.6 COVID negative UA negative blood cultures no growth so far.  CXR clear Likely aspiration, on unasyn Management per primary team  Dysphagia  Hyponatremia/dehydration Did not pass swallow On TF via cortrak @ 55 On FW 200 Q6->300 Q6->400 Q6 On D5 @ 50cc/h Speech on board  Other Stroke Risk Factors Advanced Age >/= 83  Former Cigarette smoker Hx stroke/TIA on imaging Family hx stroke  Diastolic Congestive heart failure PVD  Other Active Problems CKD3b, Cre 2.11->2.58-> 2.56-> 2.98-> 2.76 - on D5, FW and TF Chronic anemia due to CKD Mild neurocognitive impairment - Started Aricept in May H/o prostate ca-s/p proctectomy and with chronic urinary retention  Hospital day # 10  Neurology will  sign off. Please call with questions. Pt will follow up with [name] at [facility] on 06/19/21 and [place] [facility] at [facility] on 10/06/21. Thanks for the consult.   Rosalin Hawking, MD PhD Stroke Neurology 06/07/2021 6:32 PM    To contact Stroke Continuity provider, please refer to [email or web address] After hours, contact General Neurology

## 2021-06-07 NOTE — Progress Notes (Signed)
PROGRESS NOTE    Casey Greene  IFO:[id number] DOB: 1951/01/12 DOA: 05/28/2021 PCP: [name], MD   Brief Narrative:  The patient is a 70 year old African-American male with a past medical history significant for but not limited to history of CVA, mild neurocognitive dysfunction, PAD, chronic diastolic CHF, diabetes mellitus type 2, hypertension, chronic kidney disease stage IV as well as other comorbidities who presented with left-sided hemiplegia and found to have an acute right thalamic hemorrhage.  He is admitted by neurology to the neurointensive care unit and stabilized and then subsequently transferred to the hospitalist service.  Upon 7 7 he became febrile and there was suspicion for microaspiration so he started on IV Unasyn.  Given his lack of improvement and his family's wishes for aggressive care interventional radiology was consulted for possible PEG tube placement.  Palliative care goals of care happening at this time and his mental status is not improving.  He is currently on aspirin and Plavix being given.  Throughout core track tube feeding.  Given his suspicion for aspiration pneumonia and his decreased mental status with ongoing copious secretions he was initiated on breathing treatments and ID was consulted.  He has a very poor prognosis and palliative care discussing with family about possible PEG tube placement.  Continuing goals of care discussion at this time.  Patient's current significant events and significant studies are as follows: 7/3>> presented with left-sided hemiplegia-right thalamic hemorrhage on CT-admit to neuro ICU 7/7>> transfer to [facility] 7/7>> febrile-suspicion for microaspiration-Unasyn started   Significant studies: 6/12>> TTE: EF 67-67%, grade 2 diastolic dysfunction. 7/3>> acute right thalamic hemorrhage with intraventricular extension and hydrocephalus. 7/3>> CT head: Unchanged size of intraparenchymal hemorrhage 7/4>> MRI brain: 2 small acute infarct  in rt parietal lobe, acute right thalamic hemorrhage with intraventricular extension. 7/4>> MRA brain: No significant stenosis or aneurysm 7/4>> MRA neck: No significant stenosis 7/4>> LDL: 50 7/4>> A1c: 5.1 7/7>> CXR: No pneumonia 7/8>> CXR: No pneumonia 7/9>> CT chest: Some scattered secretions in the central trachea/right mainstem bronchus-lungs are clear. 7/9>> CT abdomen: Diffusely fluid-filled appearance of the colon with lack of formed stools, complex multilobulated ventral hernia.  7/12>> CT head: Decreased right thalamic/intraventricular hemorrhage since last week 7/12>> CXR: No pneumonia 06/06/2021 patient had an EEG done which was suggestive of moderate diffuse encephalopathy with nonspecific etiology but there is no seizures or epileptiform discharges seen throughout the recording 06/07/21 patient remains lethargic  Assessment & Plan:   Active Problems:   ICH (intracerebral hemorrhage) (HCC)   Protein-calorie malnutrition, severe  Right thalamic intracranial hemorrhage with intraventricular extension and hydrocephalus: -Neurological deficits remain unchanged-left-sided hemiplegia/dysphagia persists -Neurology recommending to keep SBP less than 160-bit more lethargic today (repeat CT head stable/ABG without hypercarbia).   -Continue supportive care-continues to have dysphagia and accumulates secretion-family desires full scope of treatment (see palliative care note)-have consulted IR for PEG tube placement but after goals of care discussion the family with palliative they are currently-hold to reevaluate and assess -Will conider resuming Robinul for secretions -Continues to be hard to arouse (CThead/ABG done earlier this am)-coughing-grimaces to pain-per RN opens eyes at time. Dr. Sonia Side Spoke with Stroke MD-Dr Erlinda Hong yesterday who evaluated and recommended MRI Brain and EEG. -Given issues with lethargy-stop both clonidine and Robinul (was already lethargic this am-when these agents  were started)   Dysphagia:  Currently on NG tube feedings-continues to have significant dysphagia-have consulted IR for PEG tube placement  and Palliative discussing with family and family weighing risks vs. Benefits  of PEG Placement     Fever-suspicion for microaspiration:  Acute respiratory failure with hypoxia -High suspicion for microaspiration-accumulating secretions-although no parenchymal disease seen on imaging studies-has secretions/mucus in his airways (seen on CT chest).   -Doubt UTI-UA benign-even though he has some bilateral perinephric standing on CT.   -Suspect that this is either microaspiration-or central fever.  Appreciate ID input-recommendations are for total of 7 days of Unasyn.    -We will add Xopenex and Atrovent -ID recommending follow up on repeat Blood Cx on 06/06/21 and following Lower extremity Duplex -Aspiration Precautions   Episode of hematochezia on 7/9:  -CBC stable-none since then-plan is to watch closely for recurrence.  Hemoglobin relatively stable. -Patient's hemoglobin/hematocrit has been relatively stable and gone from 8.5/26.9 -> 9.1/28.5 -> 8.9/27.8 -Continue to Monitor for S/Sx of Bleeding -Repeat CBC in the AM  HyperNatremia/hyperchloremia -Mild and is worsening a sodium is gone from 146 is now 148 but has been as high as 149; C/w Free Water Flushes -Continued IV fluid hydration from normal saline to D5W -Continue monitor and trend and repeat CMP in a.m.   HTN: -BP creeping up-goal SBP less than 160-added clonidine but has b3een held as above -continue Coreg/hydralazine.  Losartan on hold due to fluctuating renal function. -Continue to Monitor BP per Protocol -Last BP was 144/77   HLD -On statins prior to this hospital stay-resume when oral intake established.   HFpEF -Euvolemic on exam -Strict I's and O's and Daily Weights -Continue to Monitor Volume Status given that he is on IVF    AKI CKD stage IV: AKI improving slowly after  starting normal saline but will change to D5W.   -Continue to hold losartan.   -BUN/Cr went from 83/2.98 -> 81/2.72 -> 83/2.76 -No signs of urinary retention-check frequent bladder scans.   History of mild neurocognitive dysfunction -Resume Aricept when able.   Normocytic anemia:  -Due to CKD-no indication for transfusion-follow CBC periodically.   Palliative care: Currently Full code -Appreciate palliative care input-patient/family desires full scope of treatment.  Dr. Sonia Side has had repeated discussions with spouse-she is aware of the issue with aspiration pneumonia-and placing a PEG tube will not minimize risk of aspiration.   -Given ongoing lethargy-aspiration issues/accumulation of secretions-with lack of any significant improvement-he remains with poor prognosis-and at risk for further decompensation.  He Spoke at length with spouse on 7/12 and relayed concerns againhave implored her to reexamine goals of care -Palliative Care reconsulted for GOC    Severe malnutrition in the context of chronic illness -Evidenced by moderate fat depletion, severe fat depletion and 23% weight loss -Nutritionist consulted and currently on tube feedings with a multivitamin and prostat -Estimated body mass index is 22.71 kg/m as calculated from the following:   Height as of this encounter: 5\' 10"  (1.778 m).   Weight as of this encounter: 71.8 kg.  DVT prophylaxis: Heparin 5,000 sq q8h Code Status: FULL CODE  Family Communication: No family present at bedside  Disposition Plan: Pending further GOC Discussion  Status is: Inpatient  Remains inpatient appropriate because:Unsafe d/c plan, IV treatments appropriate due to intensity of illness or inability to take PO, and Inpatient level of care appropriate due to severity of illness  Dispo: The patient is from: Home              Anticipated d/c is to:  TBD              Patient currently is not medically stable to d/c.  Difficult to place patient  No  Consultants:  Palliative Care Neurology  PCCM   Procedures: As above  Antimicrobials:  Anti-infectives (From admission, onward)    Start     Dose/Rate Route Frequency Ordered Stop   06/01/21 1500  Ampicillin-Sulbactam (UNASYN) 3 g in sodium chloride 0.9 % 100 mL IVPB        3 g 200 mL/hr over 30 Minutes Intravenous Every 12 hours 06/01/21 1402 06/08/21 1559        Subjective: Seen and examined at bedside and he is very lethargic and not very responsive.  Continues to desaturate intermittently.  No family at bedside and unable to provide a subjective history due to his current condition.  Objective: Vitals:   06/07/21 0031 06/07/21 0431 06/07/21 0635 06/07/21 0810  BP: (!) 150/85 136/80  127/79  Pulse: 96   86  Resp: 20   18  Temp: 99.1 F (37.3 C) 99.5 F (37.5 C)  (!) 100.4 F (38 C)  TempSrc: Axillary Axillary  Axillary  SpO2: 100%   93%  Weight:   71.8 kg   Height:        Intake/Output Summary (Last 24 hours) at 06/07/2021 0814 Last data filed at 06/07/2021 0315 Gross per 24 hour  Intake 632.05 ml  Output 2391 ml  Net -1758.95 ml   Filed Weights   05/28/21 1924 05/31/21 0500 06/07/21 0635  Weight: 73.9 kg 71.4 kg 71.8 kg   Examination: Physical Exam:  Constitutional: Chronically ill-appearing elderly African-American male currently in no acute distress but is very lethargic and not very responsive due to his current condition Eyes: Lids and conjunctivae normal, sclerae anicteric  ENMT: External Ears, Nose appear normal.  Has a core track in place Neck: Appears normal, supple, no cervical masses, normal ROM, no appreciable thyromegaly; no JVD Respiratory: Diminished to auscultation bilaterally with coarse breath sounds and some, no wheezing, rales, rhonchi or crackles.  Slightly tachypneic and desaturates into the 80s.  Cardiovascular: RRR, no murmurs / rubs / gallops. S1 and S2 auscultated.   Abdomen: Soft, non-tender, non-distended. Bowel sounds  positive.  GU: Deferred. Musculoskeletal: No clubbing / cyanosis of digits/nails. No joint deformity upper and lower extremities.  Skin: No rashes, lesions, ulcers on limited skin evaluation. No induration; Warm and dry.  Neurologic: Very lethargic and does not follow commands Psychiatric: Impaired  judgment and insight.  He is not alert and oriented x 3. Normal mood and appropriate affect.   Data Reviewed: I have personally reviewed following labs and imaging studies  CBC: Recent Labs  Lab 06/03/21 1946 06/04/21 0241 06/05/21 0421 06/06/21 0327 06/07/21 0342  WBC 10.5 11.1* 8.7 9.9 10.2  HGB 9.5* 9.3* 8.5* 9.1* 8.9*  HCT 29.5* 28.7* 26.9* 28.5* 27.8*  MCV 98.3 97.6 99.6 98.6 98.9  PLT 167 169 161 180 482   Basic Metabolic Panel: Recent Labs  Lab 05/31/21 1452 06/01/21 0825 06/01/21 0825 06/02/21 0259 06/03/21 0106 06/04/21 0241 06/05/21 0421 06/06/21 0327 06/07/21 0342  NA  --  145   < > 146* 148* 149* 146* 147* 148*  K  --  3.9   < > 3.8 3.6 3.7 3.7 3.7 3.7  CL  --  111   < > 113* 113* 113* 109 114* 116*  CO2  --  27   < > 23 27 26 27 26 27   GLUCOSE  --  123*   < > 147* 143* 149* 162* 147* 148*  BUN  --  43*   < >  55* 62* 67* 83* 81* 83*  CREATININE  --  2.11*   < > 2.58* 2.56* 2.47* 2.98* 2.72* 2.76*  CALCIUM  --  10.5*   < > 10.8* 11.1* 11.5* 11.2* 11.1* 11.2*  MG 1.5* 1.7  --  2.1  --   --   --   --   --   PHOS 2.7  --   --   --   --   --   --   --   --    < > = values in this interval not displayed.   GFR: Estimated Creatinine Clearance: 24.6 mL/min (A) (by C-G formula based on SCr of 2.76 mg/dL (H)). Liver Function Tests: Recent Labs  Lab 06/01/21 0825 06/02/21 0259 06/03/21 0106 06/04/21 0241 06/05/21 0421  AST 37 34 31 28 23   ALT 13 13 13 12 11   ALKPHOS 46 37* 38 39 31*  BILITOT 0.6 0.8 0.7 0.9 0.5  PROT 7.0 6.8 7.0 6.8 6.2*  ALBUMIN 3.3* 3.1* 3.1* 3.0* 2.7*   No results for input(s): LIPASE, AMYLASE in the last 168 hours. No results for  input(s): AMMONIA in the last 168 hours. Coagulation Profile: Recent Labs  Lab 06/07/21 0342  INR 1.0   Cardiac Enzymes: No results for input(s): CKTOTAL, CKMB, CKMBINDEX, TROPONINI in the last 168 hours. BNP (last 3 results) Recent Labs    02/03/21 1100  PROBNP 635*   HbA1C: No results for input(s): HGBA1C in the last 72 hours. CBG: Recent Labs  Lab 06/06/21 0808 06/06/21 1149 06/06/21 1541 06/06/21 1956 06/07/21 0055  GLUCAP 139* 148* 143* 126* 142*   Lipid Profile: No results for input(s): CHOL, HDL, LDLCALC, TRIG, CHOLHDL, LDLDIRECT in the last 72 hours. Thyroid Function Tests: No results for input(s): TSH, T4TOTAL, FREET4, T3FREE, THYROIDAB in the last 72 hours. Anemia Panel: No results for input(s): VITAMINB12, FOLATE, FERRITIN, TIBC, IRON, RETICCTPCT in the last 72 hours. Sepsis Labs: Recent Labs  Lab 06/02/21 1243 06/03/21 0106 06/04/21 0241  PROCALCITON 0.12 0.17 0.14    Recent Results (from the past 240 hour(s))  MRSA Next Gen by PCR, Nasal     Status: None   Collection Time: 05/28/21  8:13 PM   Specimen: Nasal Mucosa; Nasal Swab  Result Value Ref Range Status   MRSA by PCR Next Gen NOT DETECTED NOT DETECTED Final    Comment: (NOTE) The GeneXpert MRSA Assay (FDA approved for NASAL specimens only), is one component of a comprehensive MRSA colonization surveillance program. It is not intended to diagnose MRSA infection nor to guide or monitor treatment for MRSA infections. Test performance is not FDA approved in patients less than 8 years old. Performed at [facility], [place] [address] [place], [place] [address]   Culture, blood (routine x 2)     Status: None   Collection Time: 06/01/21  8:14 AM   Specimen: BLOOD  Result Value Ref Range Status   Specimen Description BLOOD RIGHT ANTECUBITAL  Final   Special Requests   Final    BOTTLES DRAWN AEROBIC AND ANAEROBIC Blood Culture adequate volume   Culture   Final    NO GROWTH 5  DAYS Performed at [facility], [place] [address], [place], [place] [address]    Report Status 06/06/2021 FINAL  Final  Culture, blood (routine x 2)     Status: None   Collection Time: 06/01/21  8:31 AM   Specimen: BLOOD RIGHT HAND  Result Value Ref Range Status  Specimen Description BLOOD RIGHT HAND  Final   Special Requests   Final    BOTTLES DRAWN AEROBIC AND ANAEROBIC Blood Culture adequate volume   Culture   Final    NO GROWTH 5 DAYS Performed at [facility], [address], [place], [place] [address]    Report Status 06/06/2021 FINAL  Final  Resp Panel by RT-PCR (Flu A&B, Covid) Nasopharyngeal Swab     Status: None   Collection Time: 06/01/21  2:02 PM   Specimen: Nasopharyngeal Swab; Nasopharyngeal(NP) swabs in vial transport medium  Result Value Ref Range Status   SARS Coronavirus 2 by RT PCR NEGATIVE NEGATIVE Final    Comment: (NOTE) SARS-CoV-2 target nucleic acids are NOT DETECTED.  The SARS-CoV-2 RNA is generally detectable in upper respiratory specimens during the acute phase of infection. The lowest concentration of SARS-CoV-2 viral copies this assay can detect is 138 copies/mL. A negative result does not preclude SARS-Cov-2 infection and should not be used as the sole basis for treatment or other patient management decisions. A negative result may occur with  improper specimen collection/handling, submission of specimen other than nasopharyngeal swab, presence of viral mutation(s) within the areas targeted by this assay, and inadequate number of viral copies(<138 copies/mL). A negative result must be combined with clinical observations, patient history, and epidemiological information. The expected result is Negative.  Fact Sheet for Patients:  EntrepreneurPulse.com.au  Fact Sheet for Healthcare Providers:  IncredibleEmployment.be  This test is no t yet approved or cleared by the [place] FDA and  has been  authorized for detection and/or diagnosis of SARS-CoV-2 by FDA under an Emergency Use Authorization (EUA). This EUA will remain  in effect (meaning this test can be used) for the duration of the COVID-19 declaration under Section 564(b)(1) of the Act, 21 U.S.C.section 360bbb-3(b)(1), unless the authorization is terminated  or revoked sooner.       Influenza A by PCR NEGATIVE NEGATIVE Final   Influenza B by PCR NEGATIVE NEGATIVE Final    Comment: (NOTE) The Xpert Xpress SARS-CoV-2/FLU/RSV plus assay is intended as an aid in the diagnosis of influenza from Nasopharyngeal swab specimens and should not be used as a sole basis for treatment. Nasal washings and aspirates are unacceptable for Xpert Xpress SARS-CoV-2/FLU/RSV testing.  Fact Sheet for Patients: EntrepreneurPulse.com.au  Fact Sheet for Healthcare Providers: IncredibleEmployment.be  This test is not yet approved or cleared by the [place] FDA and has been authorized for detection and/or diagnosis of SARS-CoV-2 by FDA under an Emergency Use Authorization (EUA). This EUA will remain in effect (meaning this test can be used) for the duration of the COVID-19 declaration under Section 564(b)(1) of the Act, 21 U.S.C. section 360bbb-3(b)(1), unless the authorization is terminated or revoked.  Performed at [facility], [place] [address] [place], [place] [address]   Fungus culture, blood     Status: None (Preliminary result)   Collection Time: 06/05/21  1:43 PM   Specimen: BLOOD RIGHT HAND  Result Value Ref Range Status   Specimen Description BLOOD RIGHT HAND  Final   Special Requests   Final    BOTTLES DRAWN AEROBIC ONLY Blood Culture results may not be optimal due to an inadequate volume of blood received in culture bottles   Culture   Final    NO GROWTH < 24 HOURS Performed at [facility], [place] [address], [place], [place] [address]    Report Status PENDING  Incomplete  RN Pressure Injury Documentation:     Estimated body mass index is 22.71 kg/m as calculated from the following:   Height as of this encounter: 5\' 10"  (1.778 m).   Weight as of this encounter: 71.8 kg.  Malnutrition Type:  Nutrition Problem: Severe Malnutrition Etiology: chronic illness (Alzheimers dementia, DM, CKD, CHF)   Malnutrition Characteristics:  Signs/Symptoms: moderate fat depletion, severe fat depletion, moderate muscle depletion, severe muscle depletion, percent weight loss Percent weight loss: 23 %   Nutrition Interventions:  Interventions: Tube feeding, MVI, Prostat   Radiology Studies: CT HEAD WO CONTRAST  Result Date: 06/06/2021 CLINICAL DATA:  70 year old male with intracranial hemorrhage. EXAM: CT HEAD WITHOUT CONTRAST TECHNIQUE: Contiguous axial images were obtained from the base of the skull through the vertex without intravenous contrast. COMPARISON:  Head CT 06/03/2021 and earlier. FINDINGS: Brain: Rounded hyperdense hemorrhage in the right thalamus has slightly decreased since 06/03/2021, now approximately 26 mm diameter (versus 28-31 mm diameter previously, coronal image 36 today versus the same coronal image previously). Stable mild regional mass effect. Decreased intraventricular hemorrhage since last week, small volume residual. No ventriculomegaly. No new intracranial hemorrhage. Basilar cisterns remain normal. Stable gray-white matter differentiation elsewhere with patchy white matter hypodensity. No midline shift or evidence of cortically based acute infarction. Vascular: No suspicious intracranial vascular hyperdensity. Skull: No acute osseous abnormality identified. Sinuses/Orbits: Visualized paranasal sinuses and mastoids are stable and well aerated. Other: Left side nasoenteric tube remains in place. Stable orbit and scalp soft tissues. IMPRESSION: 1. Slightly decreased right thalamic and intraventricular hemorrhage since last week. Stable mild regional  mass effect. 2. No new intracranial abnormality. Electronically Signed   By: Genevie Ann M.D.   On: 06/06/2021 09:40   MR BRAIN WO CONTRAST  Result Date: 06/06/2021 CLINICAL DATA:  Stroke follow-up EXAM: MRI HEAD WITHOUT CONTRAST TECHNIQUE: Multiplanar, multiecho pulse sequences of the brain and surrounding structures were obtained without intravenous contrast. COMPARISON:  05/29/2021 FINDINGS: Brain: Slightly decreased size of intraparenchymal hematoma centered in the right thalamus. Persistent intraventricular extension of blood products. There are numerous chronic microhemorrhages in a predominantly central distribution. There is mild edema surrounding the hemorrhage. Early confluent hyperintense T2-weighted signal of the periventricular and deep white matter, most commonly due to chronic ischemic microangiopathy. Mild volume loss. Old small vessel infarct of the left pons. The midline structures are normal. Vascular: Major flow voids are preserved. Skull and upper cervical spine: Normal calvarium and skull base. Visualized upper cervical spine and soft tissues are normal. Sinuses/Orbits:No paranasal sinus fluid levels or advanced mucosal thickening. No mastoid or middle ear effusion. Normal orbits. IMPRESSION: 1. Slightly decreased size of right thalamic intraparenchymal hematoma with intraventricular extension. 2. Numerous chronic microhemorrhages in a predominantly central distribution, consistent with chronic hypertensive angiopathy. 3. Chronic ischemic microangiopathy. Electronically Signed   By: Ulyses Jarred M.D.   On: 06/06/2021 22:00   DG Chest Port 1 View  Result Date: 06/06/2021 CLINICAL DATA:  70 year old male with shortness of breath. Intracranial hemorrhage. EXAM: PORTABLE CHEST 1 VIEW COMPARISON:  CT Chest, Abdomen, and Pelvis today are reported separately. 06/03/2021 and earlier. FINDINGS: Portable AP upright view at 0826 hours. Enteric feeding tube courses to the abdomen, tip not included.  Lung volumes, mediastinal contours are stable and within normal limits. Allowing for portable technique the lungs are clear. No pneumothorax. No acute osseous abnormality identified. Negative visible bowel gas pattern. IMPRESSION: Enteric tube remains in place. No acute cardiopulmonary abnormality. Electronically Signed   By: Herminio Heads.D.  On: 06/06/2021 08:33   EEG adult  Result Date: 06/06/2021 Lora Havens, MD     06/06/2021  8:24 PM Patient Name: Nickholas Goldston MRN: [id number] Epilepsy Attending: Lora Havens Referring Physician/Provider: Dr Oren Binet Date: 06/06/2021 Duration: 23.05 mins Patient history: 70yo M with ams. EEG to evaluate for seizure. Level of alertness:  lethargic, asleep AEDs during EEG study: None Technical aspects: This EEG study was done with scalp electrodes positioned according to the 10-20 International system of electrode placement. Electrical activity was acquired at a sampling rate of 500Hz  and reviewed with a high frequency filter of 70Hz  and a low frequency filter of 1Hz . EEG data were recorded continuously and digitally stored. Description: No posterior dominant rhythm was seen. Sleep was characterized by sleep spindles (12-14hz 0, maximal frontocentral region. EEG showed continuous/ generalized 5 to 7 Hz theta as well as intermittent 2-3hz  delta slowing. Hyperventilation and photic stimulation were not performed.   ABNORMALITY - Continuous slow, generalized IMPRESSION: This study is suggestive of moderate diffuse encephalopathy, nonspecific etiology. No seizures or epileptiform discharges were seen throughout the recording. Priyanka Barbra Sarks    Scheduled Meds:  carvedilol  25 mg Per NG tube BID WC   chlorhexidine  15 mL Mouth Rinse BID   Chlorhexidine Gluconate Cloth  6 each Topical Q0600   feeding supplement (PROSource TF)  45 mL Per Tube BID   free water  400 mL Per Tube Q6H   heparin injection (subcutaneous)  5,000 Units Subcutaneous Q8H   hydrALAZINE  100  mg Per Tube TID   mouth rinse  15 mL Mouth Rinse q12n4p   multivitamin with minerals  1 tablet Per Tube Daily   pantoprazole sodium  40 mg Per Tube QHS   senna-docusate  1 tablet Per Tube BID   sodium chloride flush  3 mL Intravenous Once   Continuous Infusions:  ampicillin-sulbactam (UNASYN) IV 3 g (06/07/21 0315)   dextrose     feeding supplement (OSMOLITE 1.5 CAL) 1,000 mL (06/06/21 1800)    LOS: 10 days   Kerney Elbe, DO [facility] PAGER is on AMION  If 7PM-7AM, please contact night-coverage [email or web address]

## 2021-06-07 NOTE — Progress Notes (Signed)
Bilateral lower extremity venous duplex completed. Refer to "CV Proc" under chart review to view preliminary results.  06/07/2021 11:10 AM Kelby Aline., MHA, RVT, RDCS, RDMS

## 2021-06-07 NOTE — Progress Notes (Signed)
[facility] Infectious Diseases Follow Up Note  Patient Identification: Patient Name: Casey Greene MRN: [id number] [place] Date: 05/28/2021  5:29 PM Age: 70 y.o.Today's Date: 06/07/2021   Reason for Visit: Fevers   Active Problems:   ICH (intracerebral hemorrhage) (HCC)   Protein-calorie malnutrition, severe  Antibiotics: Ampsulbactam 7/7-current    Lines/Tubes: External Urinary Catheter, Feeding tube   Interval Events: continues to be febrile, but no leukocytosis   Assessment Fevers  DD aspiration, central fevers in the setting of ICH, r/o DVT  No reported diarrhea  Unlikely to be UTI given unremarkable UA although CT with non specific bilateral perinephric stranding with some urothelial and circumferential bladder wall thickening  7/7 Blood cx no growth in 5 days  7/12 blood cx NG in 1 day  CT and MRI brain with decreasing size of the intraparenchymal hematoma  Encephalopathy: CT and MRI brain with decreasing size of the intraparenchymal hematoma. EE with non specific moderate diffuse encephalopathy   Acute right thalamic hemorrhage w right ventricular extension and hydrocephalus 2 small acute infarcts of the inferior right parietal lobe   Hepatitic C , treated   Recommendations Continue Unasyn and plan to finish 7 days course Aspiration precautions  Fu blood cultures and Doppler of Lower extremities Palliative care consulted for [place] discussion, will follow their rec.s  Rest of the management as per the primary team. Thank you for the consult. Please page with pertinent questions or concerns.  ______________________________________________________________________ Subjective patient seen and examined at the bedside. Having Doppler of lower extremities   Vitals BP 127/79 (BP Location: Right Arm)   Pulse 86   Temp (!) 100.4 F (38 C) (Axillary)   Resp 18   Ht 5\' 10"  (1.778 m)   Wt 71.8 kg   SpO2 93%   BMI 22.71  kg/m     Physical Exam Constitutional:  lying in bed, not in acute distress, eye open     Comments: Left facial droop   Cardiovascular:     Rate and Rhythm: Normal rate and regular rhythm.     Heart sounds:   Pulmonary:     Effort: Pulmonary effort is normal.     Comments:   Abdominal:     Palpations: Abdomen is soft.     Tenderness: ventral hernia +, non tender and non distended   Musculoskeletal:        General: No swelling or tenderness.   Skin:    Comments: No lesions or rashes   Neurological:     General: Left sided weakness   Psychiatric:        Mood and Affect: unable to assess  Pertinent Microbiology Results for orders placed or performed during the hospital encounter of 05/28/21  MRSA Next Gen by PCR, Nasal     Status: None   Collection Time: 05/28/21  8:13 PM   Specimen: Nasal Mucosa; Nasal Swab  Result Value Ref Range Status   MRSA by PCR Next Gen NOT DETECTED NOT DETECTED Final    Comment: (NOTE) The GeneXpert MRSA Assay (FDA approved for NASAL specimens only), is one component of a comprehensive MRSA colonization surveillance program. It is not intended to diagnose MRSA infection nor to guide or monitor treatment for MRSA infections. Test performance is not FDA approved in patients less than 45 years old. Performed at [facility], [place] [address] [place], [place] [address]   Culture, blood (routine x 2)     Status: None   Collection Time: 06/01/21  8:14  AM   Specimen: BLOOD  Result Value Ref Range Status   Specimen Description BLOOD RIGHT ANTECUBITAL  Final   Special Requests   Final    BOTTLES DRAWN AEROBIC AND ANAEROBIC Blood Culture adequate volume   Culture   Final    NO GROWTH 5 DAYS Performed at [facility], [address], [place], [place] [address]    Report Status 06/06/2021 FINAL  Final  Culture, blood (routine x 2)     Status: None   Collection Time: 06/01/21  8:31 AM   Specimen: BLOOD RIGHT HAND  Result Value Ref  Range Status   Specimen Description BLOOD RIGHT HAND  Final   Special Requests   Final    BOTTLES DRAWN AEROBIC AND ANAEROBIC Blood Culture adequate volume   Culture   Final    NO GROWTH 5 DAYS Performed at [facility], [place] [address], [place], [place] [address]    Report Status 06/06/2021 FINAL  Final  Resp Panel by RT-PCR (Flu A&B, Covid) Nasopharyngeal Swab     Status: None   Collection Time: 06/01/21  2:02 PM   Specimen: Nasopharyngeal Swab; Nasopharyngeal(NP) swabs in vial transport medium  Result Value Ref Range Status   SARS Coronavirus 2 by RT PCR NEGATIVE NEGATIVE Final    Comment: (NOTE) SARS-CoV-2 target nucleic acids are NOT DETECTED.  The SARS-CoV-2 RNA is generally detectable in upper respiratory specimens during the acute phase of infection. The lowest concentration of SARS-CoV-2 viral copies this assay can detect is 138 copies/mL. A negative result does not preclude SARS-Cov-2 infection and should not be used as the sole basis for treatment or other patient management decisions. A negative result may occur with  improper specimen collection/handling, submission of specimen other than nasopharyngeal swab, presence of viral mutation(s) within the areas targeted by this assay, and inadequate number of viral copies(<138 copies/mL). A negative result must be combined with clinical observations, patient history, and epidemiological information. The expected result is Negative.  Fact Sheet for Patients:  EntrepreneurPulse.com.au  Fact Sheet for Healthcare Providers:  IncredibleEmployment.be  This test is no t yet approved or cleared by the [place] FDA and  has been authorized for detection and/or diagnosis of SARS-CoV-2 by FDA under an Emergency Use Authorization (EUA). This EUA will remain  in effect (meaning this test can be used) for the duration of the COVID-19 declaration under Section 564(b)(1) of the Act,  21 U.S.C.section 360bbb-3(b)(1), unless the authorization is terminated  or revoked sooner.       Influenza A by PCR NEGATIVE NEGATIVE Final   Influenza B by PCR NEGATIVE NEGATIVE Final    Comment: (NOTE) The Xpert Xpress SARS-CoV-2/FLU/RSV plus assay is intended as an aid in the diagnosis of influenza from Nasopharyngeal swab specimens and should not be used as a sole basis for treatment. Nasal washings and aspirates are unacceptable for Xpert Xpress SARS-CoV-2/FLU/RSV testing.  Fact Sheet for Patients: EntrepreneurPulse.com.au  Fact Sheet for Healthcare Providers: IncredibleEmployment.be  This test is not yet approved or cleared by the [place] FDA and has been authorized for detection and/or diagnosis of SARS-CoV-2 by FDA under an Emergency Use Authorization (EUA). This EUA will remain in effect (meaning this test can be used) for the duration of the COVID-19 declaration under Section 564(b)(1) of the Act, 21 U.S.C. section 360bbb-3(b)(1), unless the authorization is terminated or revoked.  Performed at [facility], [place] [address], [place],  [address]   Fungus culture, blood  Status: None (Preliminary result)   Collection Time: 06/05/21  1:43 PM   Specimen: BLOOD RIGHT HAND  Result Value Ref Range Status   Specimen Description BLOOD RIGHT HAND  Final   Special Requests   Final    BOTTLES DRAWN AEROBIC ONLY Blood Culture results may not be optimal due to an inadequate volume of blood received in culture bottles   Culture   Final    NO GROWTH 2 DAYS Performed at [facility], [place] [address] [place], [place] [address]    Report Status PENDING  Incomplete  Culture, blood (single)     Status: None (Preliminary result)   Collection Time: 06/06/21  7:33 AM   Specimen: BLOOD  Result Value Ref Range Status   Specimen Description BLOOD RIGHT ANTECUBITAL  Final   Special Requests   Final    BOTTLES DRAWN AEROBIC  AND ANAEROBIC Blood Culture adequate volume   Culture   Final    NO GROWTH 1 DAY Performed at [facility], [place] [address], [place], [place] [address]    Report Status PENDING  Incomplete     Pertinent Lab. CBC Latest Ref Rng & Units 06/07/2021 06/06/2021 06/05/2021  WBC 4.0 - 10.5 K/uL 10.2 9.9 8.7  Hemoglobin 13.0 - 17.0 g/dL 8.9(L) 9.1(L) 8.5(L)  Hematocrit 39.0 - 52.0 % 27.8(L) 28.5(L) 26.9(L)  Platelets 150 - 400 K/uL 191 180 161   CMP Latest Ref Rng & Units 06/07/2021 06/06/2021 06/05/2021  Glucose 70 - 99 mg/dL 148(H) 147(H) 162(H)  BUN 8 - 23 mg/dL 83(H) 81(H) 83(H)  Creatinine 0.61 - 1.24 mg/dL 2.76(H) 2.72(H) 2.98(H)  Sodium 135 - 145 mmol/L 148(H) 147(H) 146(H)  Potassium 3.5 - 5.1 mmol/L 3.7 3.7 3.7  Chloride 98 - 111 mmol/L 116(H) 114(H) 109  CO2 22 - 32 mmol/L 27 26 27   Calcium 8.9 - 10.3 mg/dL 11.2(H) 11.1(H) 11.2(H)  Total Protein 6.5 - 8.1 g/dL - - 6.2(L)  Total Bilirubin 0.3 - 1.2 mg/dL - - 0.5  Alkaline Phos 38 - 126 U/L - - 31(L)  AST 15 - 41 U/L - - 23  ALT 0 - 44 U/L - - 11     Pertinent Imaging today Plain films and CT images have been personally visualized and interpreted; radiology reports have been reviewed. Decision making incorporated into the Impression / Recommendations.  MRI brain 06/06/21 IMPRESSION: 1. Slightly decreased size of right thalamic intraparenchymal hematoma with intraventricular extension. 2. Numerous chronic microhemorrhages in a predominantly central distribution, consistent with chronic hypertensive angiopathy. 3. Chronic ischemic microangiopathy.  CT head 06/06/21   IMPRESSION: 1. Slightly decreased right thalamic and intraventricular hemorrhage since last week. Stable mild regional mass effect. 2. No new intracranial abnormality.   I spent more than 35 minutes for this patient encounter including review of prior medical records, coordination of care  with greater than 50% of time being face to face/counseling and  discussing diagnostics/treatment plan with the patient/family.  Electronically signed by:   Rosiland Oz, MD Infectious Disease Physician [facility] Pager: [phone]

## 2021-06-07 NOTE — Plan of Care (Signed)
  Problem: Intracerebral Hemorrhage Tissue Perfusion: Goal: Complications of Intracerebral Hemorrhage will be minimized Outcome: Progressing   

## 2021-06-07 NOTE — Progress Notes (Signed)
At the beginning of the shift was lethargic and even obtunded at times.  As the shift went on, he did become more alert and was following commands, especially after returning from MRI.  MEWS went from yellow to green and fever did go down.  At beginning of the shift I did place ice packs in axillae and his fever did break after that.

## 2021-06-07 NOTE — Progress Notes (Signed)
Physical Therapy Treatment Patient Details Name: Casey Greene MRN: [id number] DOB: [birth year] Today's Date: 06/07/2021    History of Present Illness 62 male admitted to [facility] on 7/3 with R thalamic hemorrhage with intraventricular extension and hydrocephalus, no plan for neurosurgical intervention. PMH includes CKD stage III, hyperlipidemia, essential hypertension, LVH, mild neurocognitive disorder on Aricept, peripheral vascular disease, chronic diastolic heart failure, ACD, type 2 diabetes mellitus, and remote strokes identified on MRI in January 2022- remote pontine and bilateral thalamic infarcts and subacute infarcts identified on imaging in June 2022 on aspirin and clopidogrel.    PT Comments    Session limited by lethargy. Patient answering to voice and responding to name, however quickly returning to sleep. Performed PROM to bilateral LE and AAROM for SLR on R LE. Performed oral care with suction with patient able to follow simple command of "open mouth". Continue to recommend SNF for ongoing Physical Therapy.       Follow Up Recommendations  SNF     Equipment Recommendations  Hospital bed;Wheelchair (measurements PT);Wheelchair cushion (measurements PT) (hoyer lift)    Recommendations for Other Services       Precautions / Restrictions Precautions Precautions: Fall Precaution Comments: BP <140, cortrak Restrictions Weight Bearing Restrictions: No    Mobility  Bed Mobility               General bed mobility comments: deferred due to increased lethargy and wet cough and speech noted with MDs suggesting possible aspiration    Transfers                    Ambulation/Gait                 Stairs             Wheelchair Mobility    Modified Rankin (Stroke Patients Only) Modified Rankin (Stroke Patients Only) Pre-Morbid Rankin Score: Slight disability Modified Rankin: Severe disability     Balance                                             Cognition Arousal/Alertness: Lethargic Behavior During Therapy: Flat affect Overall Cognitive Status: Difficult to assess                                 General Comments: responds to voice but quickly returns to sleep. Answering with name called and mumbles his name      Exercises Other Exercises Other Exercises: PROM B LEs ankle DF, knee extension/flexion, hip flexion Other Exercises: AAROM SLR on R Other Exercises: performed oral care with suction    General Comments        Pertinent Vitals/Pain Pain Assessment: Faces Faces Pain Scale: Hurts little more Pain Location: LE ROM Pain Descriptors / Indicators: Grimacing Pain Intervention(s): Monitored during session;Repositioned    Home Living                      Prior Function            PT Goals (current goals can now be found in the care plan section) Acute Rehab PT Goals Patient Stated Goal: did not state, lethargic PT Goal Formulation: With patient Time For Goal Achievement: 06/13/21 Potential to Achieve Goals: Fair Progress towards PT goals: Not progressing toward goals -  comment    Frequency    Min 2X/week      PT Plan Current plan remains appropriate    Co-evaluation              AM-PAC PT "6 Clicks" Mobility   Outcome Measure  Help needed turning from your back to your side while in a flat bed without using bedrails?: Total Help needed moving from lying on your back to sitting on the side of a flat bed without using bedrails?: Total Help needed moving to and from a bed to a chair (including a wheelchair)?: Total Help needed standing up from a chair using your arms (e.g., wheelchair or bedside chair)?: Total Help needed to walk in hospital room?: Total Help needed climbing 3-5 steps with a railing? : Total 6 Click Score: 6    End of Session   Activity Tolerance: Patient limited by lethargy Patient left: with call bell/phone within reach;in  bed;with bed alarm set Nurse Communication: Mobility status PT Visit Diagnosis: Other abnormalities of gait and mobility (R26.89);Unsteadiness on feet (R26.81);Muscle weakness (generalized) (M62.81);Difficulty in walking, not elsewhere classified (R26.2);Other symptoms and signs involving the nervous system (Y51.102)     Time: 1117-3567 PT Time Calculation (min) (ACUTE ONLY): 19 min  Charges:  $Therapeutic Exercise: 8-22 mins                     Zachariah Pavek A. Gilford Rile PT, DPT Acute Rehabilitation Services Pager [phone] Office [phone]    Linna Hoff 06/07/2021, 5:15 PM

## 2021-06-07 NOTE — Progress Notes (Signed)
Palliative:  HPI: 70 y.o. male with past medical history of multiple CVAs, DM, HFpEF (G2DD), PVD, prostate CA, CKD 3, MDD, mild neurocognitive disorder, and severe malnutrition, who was admitted on 05/28/2021 with left sided weakness.  He was found to have an intercerebral hemorrage in the right thalamic area with intraventricular extension and hydrocephalus.  Neurology recommendations have been made.  He is on aspirin and plavix and being considered for CIR.  A cor trak was placed for nutrition.  On 7/7 the patient developed a fever. Blood cultures are still pending but his very wet vocal quality and thick secretions give rise to suspicion for aspiration pneumonia. Palliative care requested to re-engage in conversations 06/06/21 due to decreased mental status and ongoing aspiration with copious secretions.   I met today at Casey Greene's bedside along with wife, son, daughter present. We discussed decline in mental status, fevers, aspiration (bedside canister full of suctioned secretions), and overall expectations and poor prognosis. We discussed risk of aspiration due to damage to brain from bleed and effecting his swallowing muscles and his own oral saliva is continuously seeping down into his lungs. I explained that this will lead to recurrent aspiration and pneumonia and that unfortunately there is nothing we can do to reverse this problem due to the fact we cannot reverse the damage to his brain. We discussed that in mild cases we can see some improvement in time but the severity of Casey Greene's aspiration and swallow dysfunction this is not expected. I explained that this issue can lead to recurrent infection and decline as well as issues with his breathing. I explained that we can provide tube feeding for nutrition but the feeding will not change his aspiration risk (can in fact make this worse by increasing salivation) and I would not expect continued tube feeding to aide in improvement of his overall  condition. I also asked them to consider the husband and father they know and the elements that make him who he is and if we cannot get him healthy enough to be himself are we really helping him by putting him through these invasive measures to keep him in a life of existing but not truly living. I asked them to consider plan if his breathing is effected and the limited benefits of pursuing life support and resuscitative measures. They understand but are struggling with him not being able to participate in conversation and the vagueness and ambiguity of his Advance Directive. I encourage them to have ongoing conversations about the interventions we do for him versus to him. I also explained that there will likely be a time that his body will continue to fail despite aggressive care and interventions. Family express understanding and wife and daughter specifically express continued interest in PEG placement if he stabilizes to proceed but agree to discuss further as well to make sure this is the best decision for him.   Of importance they do explain that Casey Greene had a very bad experience with his mother's end of life as he was attempting to obtain HCPOA but unable to be done and she was noted to have dementia but was made comfort care supposedly at her request and actions were taken without family awareness and involvement. I explained that this is unusual and not at all appropriate. I reassure them that this is the reason for [place] to meet together to continue conversations to ensure that we are all on the same page with expectations and how to care best for  Casey Greene. Even if Mr. Lye were completely awake and oriented I would want his family to participate and be aware of his wishes and decisions. I can only imagine that this situation has naturally created some level of fear and mistrust of how decisions are made in healthcare - hopefully we can prove to family how this can be done in a more appropriate  manner.   All questions/concerns addressed. Emotional support provided.   Exam: Not responding to family this morning. Does seem to make attempt to open eyes very briefly but no tracking and no purposeful (or non purposeful actions). Breathing labored with copious secretions - strong cough but unable to clear secretions or expectorate (pointed out to family this cough and congestion that he is unable to clear continues to leak down into lungs). Abd soft. Low grade fevers.   Plan: - Will need ongoing palliative care discussions and support.  - I have requested that family continue discussions regarding the risk vs benefit of PEG placement and code status. Will ask one of my colleagues to follow up tomorrow on progress of discussion.   62 min  Vinie Sill, NP Palliative Medicine Team Pager [phone] (Please see [email or web address] for schedule) Team Phone [phone]    Greater than 50%  of this time was spent counseling and coordinating care related to the above assessment and plan

## 2021-06-08 ENCOUNTER — Inpatient Hospital Stay (HOSPITAL_COMMUNITY): Payer: Medicare Other

## 2021-06-08 DIAGNOSIS — R1310 Dysphagia, unspecified: Secondary | ICD-10-CM

## 2021-06-08 DIAGNOSIS — G9341 Metabolic encephalopathy: Secondary | ICD-10-CM | POA: Diagnosis not present

## 2021-06-08 DIAGNOSIS — Z66 Do not resuscitate: Secondary | ICD-10-CM

## 2021-06-08 DIAGNOSIS — I610 Nontraumatic intracerebral hemorrhage in hemisphere, subcortical: Secondary | ICD-10-CM | POA: Diagnosis not present

## 2021-06-08 DIAGNOSIS — T17908A Unspecified foreign body in respiratory tract, part unspecified causing other injury, initial encounter: Secondary | ICD-10-CM | POA: Diagnosis not present

## 2021-06-08 DIAGNOSIS — E43 Unspecified severe protein-calorie malnutrition: Secondary | ICD-10-CM | POA: Diagnosis not present

## 2021-06-08 DIAGNOSIS — Z515 Encounter for palliative care: Secondary | ICD-10-CM | POA: Diagnosis not present

## 2021-06-08 LAB — GLUCOSE, CAPILLARY
Glucose-Capillary: 148 mg/dL — ABNORMAL HIGH (ref 70–99)
Glucose-Capillary: 153 mg/dL — ABNORMAL HIGH (ref 70–99)
Glucose-Capillary: 125 mg/dL — ABNORMAL HIGH (ref 70–99)
Glucose-Capillary: 131 mg/dL — ABNORMAL HIGH (ref 70–99)
Glucose-Capillary: 149 mg/dL — ABNORMAL HIGH (ref 70–99)

## 2021-06-08 LAB — CBC WITH DIFFERENTIAL/PLATELET
Abs Immature Granulocytes: 0.06 10*3/uL (ref 0.00–0.07)
Basophils Absolute: 0 10*3/uL (ref 0.0–0.1)
Basophils Relative: 1 %
Eosinophils Absolute: 0 10*3/uL (ref 0.0–0.5)
Eosinophils Relative: 0 %
HCT: 22.8 % — ABNORMAL LOW (ref 39.0–52.0)
Hemoglobin: 7.3 g/dL — ABNORMAL LOW (ref 13.0–17.0)
Immature Granulocytes: 1 %
Lymphocytes Relative: 6 %
Lymphs Abs: 0.5 10*3/uL — ABNORMAL LOW (ref 0.7–4.0)
MCH: 31.7 pg (ref 26.0–34.0)
MCHC: 32 g/dL (ref 30.0–36.0)
MCV: 99.1 fL (ref 80.0–100.0)
Monocytes Absolute: 0.5 10*3/uL (ref 0.1–1.0)
Monocytes Relative: 6 %
Neutro Abs: 7.2 10*3/uL (ref 1.7–7.7)
Neutrophils Relative %: 86 %
Platelets: 177 10*3/uL (ref 150–400)
RBC: 2.3 MIL/uL — ABNORMAL LOW (ref 4.22–5.81)
RDW: 13.2 % (ref 11.5–15.5)
WBC: 8.3 10*3/uL (ref 4.0–10.5)
nRBC: 0 % (ref 0.0–0.2)

## 2021-06-08 LAB — COMPREHENSIVE METABOLIC PANEL
ALT: 12 U/L (ref 0–44)
AST: 21 U/L (ref 15–41)
Albumin: 2.4 g/dL — ABNORMAL LOW (ref 3.5–5.0)
Alkaline Phosphatase: 31 U/L — ABNORMAL LOW (ref 38–126)
Anion gap: 6 (ref 5–15)
BUN: 90 mg/dL — ABNORMAL HIGH (ref 8–23)
CO2: 25 mmol/L (ref 22–32)
Calcium: 10.7 mg/dL — ABNORMAL HIGH (ref 8.9–10.3)
Chloride: 113 mmol/L — ABNORMAL HIGH (ref 98–111)
Creatinine, Ser: 2.85 mg/dL — ABNORMAL HIGH (ref 0.61–1.24)
GFR, Estimated: 23 mL/min — ABNORMAL LOW (ref >60)
Glucose, Bld: 149 mg/dL — ABNORMAL HIGH (ref 70–99)
Potassium: 3.6 mmol/L (ref 3.5–5.1)
Sodium: 144 mmol/L (ref 135–145)
Total Bilirubin: 0.6 mg/dL (ref 0.3–1.2)
Total Protein: 5.4 g/dL — ABNORMAL LOW (ref 6.5–8.1)

## 2021-06-08 LAB — PHOSPHORUS: Phosphorus: 3.9 mg/dL (ref 2.5–4.6)

## 2021-06-08 LAB — MAGNESIUM: Magnesium: 2.2 mg/dL (ref 1.7–2.4)

## 2021-06-08 NOTE — Plan of Care (Signed)
  Problem: Pain Managment: Goal: General experience of comfort will improve Outcome: Progressing   Problem: Safety: Goal: Ability to remain free from injury will improve Outcome: Progressing   Problem: Skin Integrity: Goal: Risk for impaired skin integrity will decrease Outcome: Progressing   

## 2021-06-08 NOTE — Progress Notes (Signed)
Patient ID: Casey Greene, male   DOB: [birth year], 70 y.o.   MRN: [id number]    Progress Note from the Palliative Medicine Team at [facility]   Patient Name: Casey Greene        Date: 06/08/2021 DOB: [birth year]  Age: 70 y.o. MRN#: [id number] Attending Physician: Kerney Elbe, DO Primary Care Physician: [name], MD Admit Date: 05/28/2021   Medical records reviewed   HPI: 70 y.o. male with past medical history of multiple CVAs, DM, HFpEF (G2DD), PVD, prostate CA, CKD 3, MDD, mild neurocognitive disorder, and severe malnutrition, who was admitted on 05/28/2021 with left sided weakness.  He was found to have an intercerebral hemorrage in the right thalamic area with intraventricular extension and hydrocephalus.  Neurology recommendations have been made.  He is on aspirin and plavix and being considered for CIR.  A cor trak was placed for nutrition.  On 7/7 the patient developed a fever. Blood cultures are still pending but his very wet vocal quality and thick secretions give rise to suspicion for aspiration pneumonia. Palliative care requested to re-engage in conversations 06/06/21 due to decreased mental status and ongoing aspiration with copious secretions.   This NP assessed patient at the bedside and then spoke to  wife by phone as follow-up for palliative medicine needs, emotional support and continued clarification of goals of care.  Patient's wife verbalizes an understanding of the seriousness of her husband's current medical situation.    Plan of Care: - DNR/DNI-documented today - family is open to all offered and availble medical  intervetnions to prolong life - Open to PEG placement for nutritional support.  Education offered on the risks/benefits of PEG placement and patient's continued high risk for aspiration.  Ultimately family is hopeful for improvement.  They are taking this one day at a time.  Discussed with wife the importance of continued conversation with her  family  (strong family support) and their  medical providers regarding overall plan of care and treatment options,  ensuring decisions are within the context of the patients values and GOCs.  Questions and concerns addressed   Discussed with Dr Alfredia Ferguson   PMT will continue to support holistically   Total time spent on the unit was 35 minutes  Greater than 50% of the time was spent in counseling and coordination of care  Wadie Lessen NP  Palliative Medicine Team Team Phone # [phone] Pager [phone]

## 2021-06-08 NOTE — Progress Notes (Signed)
Palliative Medicine RN Note: Rec'd call from Mrs Mano. She was calling to confirm that she and her family DO WANT the PEG if indicated (she reports she spoke w the RN last night who had a different understanding with the plan).   I will talk to our PMT NP Wadie Lessen, who is assuming care from Vinie Sill.   Marjie Skiff Douglass [place], RN, BSN, [facility] Palliative Medicine Team 06/08/2021 8:33 AM Office [phone]

## 2021-06-08 NOTE — Progress Notes (Signed)
Modified Barium Swallow Progress Note  Patient Details  Name: Casey Greene MRN: [id number] Date of Birth: [birth year]  Today's Date: 06/08/2021  Modified Barium Swallow completed.  Full report located under Chart Review in the Imaging Section.  Brief recommendations include the following:  Clinical Impression  Pt presents with a severe oral and moderate pharyngeal dysphagia likely of multifactorial etiology including recent CVA with fluctuating mentation. While pt was able to arouse with cueing, his overall sustained attention was diminished as compared to clinical interaction this am which suspected to contribute to worsened outcome. Pt with poor labial seal with diffuse oral residuals (worst with thicker POs), anterior sulcus pocketing, decreased bolus cohesion, reduced posterior bolus propulsion, and frequent anterior spillage. Pt with mistimed epiglottic deflection, reduced pharyngeal stripping wave, poor base of tongue retraction, and diminished laryngeal sensation. This allowed for intermittent during the swallow laryngeal penetration of nectar thick liquids and post swallow spillage of residuals with all consistencies (thin, nectar, honey thick, and puree; (PAS- 3,5). Pt not able to consistently implement throat clear, cough, or volitional swallow to aid airway protection. While no appreciable aspiration exhibited during exam, pt very high risk for aspiration across a meal. Per MD report, pts family not ready for comfort diet with known aspiration risk. Pt to remain NPO with continued ST intervention and will likely pursue PEG. SLP to continue to follow.   Swallow Evaluation Recommendations       SLP Diet Recommendations: NPO       Medication Administration: Via alternative means               Oral Care Recommendations: Oral care QID        Hayden Rasmussen MA, CCC-SLP Acute Rehabilitation Services   06/08/2021,3:19 PM

## 2021-06-08 NOTE — Progress Notes (Signed)
  Speech Language Pathology Treatment: Dysphagia;Cognitive-Linquistic  Patient Details Name: Casey Greene MRN: [id number] DOB: September 25, 1951 Today's Date: 06/08/2021 Time: 1100-1140 SLP Time Calculation (min) (ACUTE ONLY): 40 min  Assessment / Plan / Recommendation Clinical Impression  Pt exhibiting EXCELLENT improvements in mentation and ability to follow commands, sequence for swallowing. Performed diligent oral care, pt with reduced management of saliva (dried secretions throughout oral cavity; removed following oral care by SLP). Pt able to masticate ice chip, propel puree tsp, and consume sips of water and nectar thick liquids via cup. Pt did exhibit some oral deficits including reduced labial seal with right sided anterior spillage and oral residuals. Palpable swallows appreciated. No overt coughing exhibited however baseline dysphonic vocal quality with low vocal intensity ( and hx of laryngeal mass) make clinical differential diagnosis at bedside challenging. Cannot exclude reduced laryngeal sensation in setting of acute CVA and comorbities. Recommend proceed with instrumental assessment; MBSS this date. MD and nursing in agreement. Defer diet recommendations until completion of MBSS.   Speech tx: Pt following simple commands with repetition approximately  75 % accuracy. He continues to exhibit left side neglect but can turn head and redirect attention to left visual field with moderate to max cues.     HPI HPI: 70 yr old admitted with LUE and LLE weakness. CT revealed acute right thalamic hemorrhage. 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 MA, CCC-SLP Acute Rehabilitation Services   06/08/2021, 11:45 AM

## 2021-06-08 NOTE — TOC CAGE-AID Note (Signed)
Transition of Care [facility]) - CAGE-AID Screening   Patient Details  Name: Casey Greene MRN: [id number] Date of Birth: Apr 12, 1951  [facility] CM/SW Contact:    Khalie Wince C Tarpley-Carter, [place] Phone Number: 06/08/2021, 10:07 AM   Clinical Narrative: Pt is unable to participate in Cage Aid, due to weakness and ability to respond.   Arlee Bossard Tarpley-Carter, MSW, LCSW-A Pronouns:  She/Her/Hers                          [place] Clinical Social WorkerTransitions of Care Cell:  [phone] [email or web address]   CAGE-AID Screening: Substance Abuse Screening unable to be completed due to: : Patient unable to participate

## 2021-06-08 NOTE — Progress Notes (Signed)
PROGRESS NOTE    Nikko Goldwire  IWP:[id number] DOB: 22-Aug-1951 DOA: 05/28/2021 PCP: [name], MD   Brief Narrative:  The patient is a 70 year old African-American male with a past medical history significant for but not limited to history of CVA, mild neurocognitive dysfunction, PAD, chronic diastolic CHF, diabetes mellitus type 2, hypertension, chronic kidney disease stage IV as well as other comorbidities who presented with left-sided hemiplegia and found to have an acute right thalamic hemorrhage.  He is admitted by neurology to the neurointensive care unit and stabilized and then subsequently transferred to the hospitalist service.  Upon 7 7 he became febrile and there was suspicion for microaspiration so he started on IV Unasyn.  Given his lack of improvement and his family's wishes for aggressive care interventional radiology was consulted for possible PEG tube placement.  Palliative care goals of care happening at this time and his mental status is not improving.  He is currently on aspirin and Plavix being given.  Throughout core track tube feeding.  Given his suspicion for aspiration pneumonia and his decreased mental status with ongoing copious secretions he was initiated on breathing treatments and ID was consulted.  He has a very poor prognosis and palliative care discussing with family about possible PEG tube placement.  Continuing goals of care discussion at this time.  Patient's current significant events and significant studies are as follows: 7/3>> presented with left-sided hemiplegia-right thalamic hemorrhage on CT-admit to neuro ICU 7/7>> transfer to [facility] 7/7>> febrile-suspicion for microaspiration-Unasyn started   Significant studies: 6/12>> TTE: EF 50-53%, grade 2 diastolic dysfunction. 7/3>> acute right thalamic hemorrhage with intraventricular extension and hydrocephalus. 7/3>> CT head: Unchanged size of intraparenchymal hemorrhage 7/4>> MRI brain: 2 small acute infarct  in rt parietal lobe, acute right thalamic hemorrhage with intraventricular extension. 7/4>> MRA brain: No significant stenosis or aneurysm 7/4>> MRA neck: No significant stenosis 7/4>> LDL: 50 7/4>> A1c: 5.1 7/7>> CXR: No pneumonia 7/8>> CXR: No pneumonia 7/9>> CT chest: Some scattered secretions in the central trachea/right mainstem bronchus-lungs are clear. 7/9>> CT abdomen: Diffusely fluid-filled appearance of the colon with lack of formed stools, complex multilobulated ventral hernia.  7/12>> CT head: Decreased right thalamic/intraventricular hemorrhage since last week 7/12>> CXR: No pneumonia 06/06/2021 patient had an EEG done which was suggestive of moderate diffuse encephalopathy with nonspecific etiology but there is no seizures or epileptiform discharges seen throughout the recording 06/07/21 patient remains lethargic  Assessment & Plan:   Active Problems:   ICH (intracerebral hemorrhage) (HCC)   Protein-calorie malnutrition, severe  Right thalamic intracranial hemorrhage with intraventricular extension and hydrocephalus: -Neurological deficits remain unchanged-left-sided hemiplegia/dysphagia persists -Neurology recommending to keep SBP less than 160-bit more lethargic today (repeat CT head stable/ABG without hypercarbia).   -Continue supportive care-continues to have dysphagia and accumulates secretion-family desires full scope of treatment (see palliative care note)-have consulted IR for PEG tube placement but after goals of care discussion the family with palliative they are currently-hold to reevaluate and assess -Will conider resuming Robinul for secretions -Continues to be hard to arouse (CThead/ABG done earlier this am)-coughing-grimaces to pain-per RN opens eyes at time. Dr. Sonia Side Spoke with Stroke MD-Dr Erlinda Hong yesterday who evaluated and recommended MRI Brain and EEG. -MRI Brain showed "Slightly decreased size of right thalamic intraparenchymal hematoma with  intraventricular  extension. Numerous chronic microhemorrhages in a predominantly central distribution, consistent with chronic hypertensive angiopathy. Chronic ischemic microangiopathy" -EEG showed "This study is suggestive of moderate diffuse encephalopathy, nonspecific etiology. No seizures or epileptiform discharges  were seen throughout the recording."  -Given issues with lethargy-stop both clonidine and Robinul (was already lethargic this am-when these agents were started)   Dysphagia:  Currently on NG tube feedings-continues to have significant dysphagia-have consulted IR for PEG tube placement  and Palliative discussing with family and family weighing risks vs. Benefits of PEG Placement  -Family wants PEG and all other life-prolonging measures per palliative discussion but patient is now DNR/DNI   Fever-suspicion for microaspiration:  Acute respiratory failure with hypoxia -High suspicion for microaspiration-accumulating secretions-although no parenchymal disease seen on imaging studies-has secretions/mucus in his airways (seen on CT chest).   -Doubt UTI-UA benign-even though he has some bilateral perinephric standing on CT.   -Suspect that this is either microaspiration-or central fever.  Appreciate ID input-recommendations are for total of 7 days of Unasyn.   We will continue IV Unasyn. -We will add Xopenex and Atrovent -ID recommending follow up on repeat Blood Cx on 06/06/21 and following Lower extremity Duplex -Aspiration Precautions   Episode of hematochezia on 7/9:  -CBC stable-none since then-plan is to watch closely for recurrence.  Hemoglobin relatively stable last few days however dropped to 7.3/22.8 today -Patient's hemoglobin/hematocrit has been relatively stable and gone from 8.5/26.9 -> 9.1/28.5 -> 8.9/27.8 and dropped to 7.3/22.8 today and will repeat and if necessary will transfuse 1 unit PRBCs -Continue to Monitor for S/Sx of Bleeding -Repeat CBC in the  AM  HyperNatremia/hyperchloremia -Mild and was worsening as sodium had gone from 146 is now 148 but has been as high as 149; C/w Free Water Flushes -Continued IV fluid hydration from normal saline to D5W.  Now sodium level is now 144 and improved -Continue monitor and trend and repeat CMP in a.m.   HTN: -BP creeping up-goal SBP less than 160-added clonidine but has been held as above -continue Coreg/hydralazine.  Losartan on hold due to fluctuating renal function. -Continue to Monitor BP per Protocol -Last BP was 136/90   HLD -On statins prior to this hospital stay-resume when oral intake established.   HFpEF -Euvolemic on exam -Strict I's and O's and Daily Weights -Continue to Monitor Volume Status given that he is on IVF    AKI CKD stage IV: AKI improving slowly after starting normal saline but will change to D5W.   -Continue to hold losartan.   -BUN/Cr went from 83/2.98 -> 81/2.72 -> 83/2.76 and is now 90/2.85 -No signs of urinary retention-check frequent bladder scans.   History of mild neurocognitive dysfunction -Resume Aricept when able.   Normocytic anemia:  -Due to CKD-no indication for transfusion at this time -follow CBC daily -Patient's hemoglobin/hematocrit has been relatively stable and gone from 8.5/26.9 -> 9.1/28.5 -> 8.9/27.8 and dropped to 7.3/22.8 today and will repeat and if necessary will transfuse 1 unit PRBCs -Continue to Monitor for S/Sx of Bleeding -Repeat CBC in the AM  Palliative care: Currently now DNR/DNI -Appreciate palliative care input-patient/family desires full scope of treatment and PEG and all life prolonging measures. Dr. Sloan Leiter has had repeated discussions with spouse-she is aware of the issue with aspiration pneumonia-and placing a PEG tube will not minimize risk of aspiration.   -Given ongoing lethargy-aspiration issues/accumulation of secretions-with lack of any significant improvement-he remains with poor prognosis-and at risk for further  decompensation.  He Spoke at length with spouse on 7/12 and relayed concerns againhave implored her to reexamine goals of care -Palliative Care reconsulted for GOC    Severe malnutrition in the context of chronic illness -Evidenced by moderate  fat depletion, severe fat depletion and 23% weight loss -Nutritionist consulted and currently on tube feedings with a multivitamin and prostat -Estimated body mass index is 22.71 kg/m as calculated from the following:   Height as of this encounter: 5\' 10"  (1.778 m).   Weight as of this encounter: 71.8 kg. -SLP evaluated and did MBS but patient failed miserably so will need PEG tube placement at this time given that he has oral deficits and moderate pharyngeal deficits. -We will pursue PEG tube placement and IR consulted and he has been on the schedule for tomorrow if they cannot do tomorrow will be done on Monday.  Issue of sedation to be worked out  between [employer] and anesthesia  DVT prophylaxis: Heparin 5,000 sq q8h Code Status: FULL CODE  Family Communication: No family present at bedside  Disposition Plan: Pending further [place] Discussion and clinical improvement; will get a PEG tube likely tomorrow  Status is: Inpatient  Remains inpatient appropriate because:Unsafe d/c plan, IV treatments appropriate due to intensity of illness or inability to take PO, and Inpatient level of care appropriate due to severity of illness  Dispo: The patient is from: Home              Anticipated d/c is to:  TBD              Patient currently is not medically stable to d/c.   Difficult to place patient No  Consultants:  Palliative Care Neurology  PCCM   Procedures: As above  Antimicrobials:  Anti-infectives (From admission, onward)    Start     Dose/Rate Route Frequency Ordered Stop   06/01/21 1500  Ampicillin-Sulbactam (UNASYN) 3 g in sodium chloride 0.9 % 100 mL IVPB        3 g 200 mL/hr over 30 Minutes Intravenous Every 12 hours 06/01/21 1402 06/08/21  0405        Subjective: Seen and examined at bedside and was lethargic again but overall more interactive today compared to yesterday.  He would mumble some things and responded to his name being called.  No acute events overnight.  Family has elected for PEG tube placement and is on the schedule for tomorrow pending IR schedule.  No other concerns reported at this time.  Objective: Vitals:   06/08/21 0448 06/08/21 0500 06/08/21 0814 06/08/21 1133  BP:   134/89 136/90  Pulse: 80  82 73  Resp: 16  18 18   Temp:   98.9 F (37.2 C) 98.5 F (36.9 C)  TempSrc:   Axillary Oral  SpO2: 100%  100% 100%  Weight:  72 kg    Height:        Intake/Output Summary (Last 24 hours) at 06/08/2021 1519 Last data filed at 06/07/2021 1830 Gross per 24 hour  Intake --  Output 400 ml  Net -400 ml    Filed Weights   05/31/21 0500 06/07/21 0635 06/08/21 0500  Weight: 71.4 kg 71.8 kg 72 kg   Examination: Physical Exam:  Constitutional: Chronically ill-appearing elderly African-American male currently in no acute distress is very lethargic and very minimally responsive and does mumble a little bit Eyes: Lids and conjunctivae normal, sclerae anicteric  ENMT: External Ears, Nose appear normal. Grossly normal hearing.  Neck: Appears normal, supple, no cervical masses, normal ROM, no appreciable thyromegaly; no JVD Respiratory: Diminished to auscultation bilaterally with coarse breath sounds and some rhonchi., no wheezing, rales, Normal respiratory effort and patient is not tachypenic. No accessory muscle use.  Unlabored breathing Cardiovascular: RRR, no murmurs / rubs / gallops. S1 and S2 auscultated. No extremity edema. 2+ pedal pulses. No carotid bruits.  Abdomen: Soft, non-tender, non-distended. Bowel sounds positive.  GU: Deferred. Musculoskeletal: No clubbing / cyanosis of digits/nails. No joint deformity upper and lower extremities.  Skin: No rashes, lesions, ulcers on limited skin evaluation. No  induration; Warm and dry.  Neurologic: Remains with large neck and does not follow commands Psychiatric: Impaired judgment and insight.  He is somnolent and not alert and oriented x 3. Normal mood and appropriate affect.   Data Reviewed: I have personally reviewed following labs and imaging studies  CBC: Recent Labs  Lab 06/04/21 0241 06/05/21 0421 06/06/21 0327 06/07/21 0342 06/08/21 0240  WBC 11.1* 8.7 9.9 10.2 8.3  NEUTROABS  --   --   --   --  7.2  HGB 9.3* 8.5* 9.1* 8.9* 7.3*  HCT 28.7* 26.9* 28.5* 27.8* 22.8*  MCV 97.6 99.6 98.6 98.9 99.1  PLT 169 161 180 191 233    Basic Metabolic Panel: Recent Labs  Lab 06/02/21 0259 06/03/21 0106 06/04/21 0241 06/05/21 0421 06/06/21 0327 06/07/21 0342 06/08/21 0240  NA 146*   < > 149* 146* 147* 148* 144  K 3.8   < > 3.7 3.7 3.7 3.7 3.6  CL 113*   < > 113* 109 114* 116* 113*  CO2 23   < > 26 27 26 27 25   GLUCOSE 147*   < > 149* 162* 147* 148* 149*  BUN 55*   < > 67* 83* 81* 83* 90*  CREATININE 2.58*   < > 2.47* 2.98* 2.72* 2.76* 2.85*  CALCIUM 10.8*   < > 11.5* 11.2* 11.1* 11.2* 10.7*  MG 2.1  --   --   --   --   --  2.2  PHOS  --   --   --   --   --   --  3.9   < > = values in this interval not displayed.    GFR: Estimated Creatinine Clearance: 23.9 mL/min (A) (by C-G formula based on SCr of 2.85 mg/dL (H)). Liver Function Tests: Recent Labs  Lab 06/02/21 0259 06/03/21 0106 06/04/21 0241 06/05/21 0421 06/08/21 0240  AST 34 31 28 23 21   ALT 13 13 12 11 12   ALKPHOS 37* 38 39 31* 31*  BILITOT 0.8 0.7 0.9 0.5 0.6  PROT 6.8 7.0 6.8 6.2* 5.4*  ALBUMIN 3.1* 3.1* 3.0* 2.7* 2.4*    No results for input(s): LIPASE, AMYLASE in the last 168 hours. No results for input(s): AMMONIA in the last 168 hours. Coagulation Profile: Recent Labs  Lab 06/07/21 0342  INR 1.0    Cardiac Enzymes: No results for input(s): CKTOTAL, CKMB, CKMBINDEX, TROPONINI in the last 168 hours. BNP (last 3 results) Recent Labs     02/03/21 1100  PROBNP 635*    HbA1C: No results for input(s): HGBA1C in the last 72 hours. CBG: Recent Labs  Lab 06/07/21 2016 06/07/21 2321 06/08/21 0333 06/08/21 0816 06/08/21 1132  GLUCAP 151* 145* 148* 153* 125*    Lipid Profile: No results for input(s): CHOL, HDL, LDLCALC, TRIG, CHOLHDL, LDLDIRECT in the last 72 hours. Thyroid Function Tests: No results for input(s): TSH, T4TOTAL, FREET4, T3FREE, THYROIDAB in the last 72 hours. Anemia Panel: No results for input(s): VITAMINB12, FOLATE, FERRITIN, TIBC, IRON, RETICCTPCT in the last 72 hours. Sepsis Labs: Recent Labs  Lab 06/02/21 1243 06/03/21 0106 06/04/21 0241  PROCALCITON 0.12 0.17 0.14  Recent Results (from the past 240 hour(s))  Culture, blood (routine x 2)     Status: None   Collection Time: 06/01/21  8:14 AM   Specimen: BLOOD  Result Value Ref Range Status   Specimen Description BLOOD RIGHT ANTECUBITAL  Final   Special Requests   Final    BOTTLES DRAWN AEROBIC AND ANAEROBIC Blood Culture adequate volume   Culture   Final    NO GROWTH 5 DAYS Performed at [facility], [address], [place], [place] [address]    Report Status 06/06/2021 FINAL  Final  Culture, blood (routine x 2)     Status: None   Collection Time: 06/01/21  8:31 AM   Specimen: BLOOD RIGHT HAND  Result Value Ref Range Status   Specimen Description BLOOD RIGHT HAND  Final   Special Requests   Final    BOTTLES DRAWN AEROBIC AND ANAEROBIC Blood Culture adequate volume   Culture   Final    NO GROWTH 5 DAYS Performed at [facility], [place] [address], [place], [place] [address]    Report Status 06/06/2021 FINAL  Final  Resp Panel by RT-PCR (Flu A&B, Covid) Nasopharyngeal Swab     Status: None   Collection Time: 06/01/21  2:02 PM   Specimen: Nasopharyngeal Swab; Nasopharyngeal(NP) swabs in vial transport medium  Result Value Ref Range Status   SARS Coronavirus 2 by RT PCR NEGATIVE NEGATIVE Final    Comment:  (NOTE) SARS-CoV-2 target nucleic acids are NOT DETECTED.  The SARS-CoV-2 RNA is generally detectable in upper respiratory specimens during the acute phase of infection. The lowest concentration of SARS-CoV-2 viral copies this assay can detect is 138 copies/mL. A negative result does not preclude SARS-Cov-2 infection and should not be used as the sole basis for treatment or other patient management decisions. A negative result may occur with  improper specimen collection/handling, submission of specimen other than nasopharyngeal swab, presence of viral mutation(s) within the areas targeted by this assay, and inadequate number of viral copies(<138 copies/mL). A negative result must be combined with clinical observations, patient history, and epidemiological information. The expected result is Negative.  Fact Sheet for Patients:  EntrepreneurPulse.com.au  Fact Sheet for Healthcare Providers:  IncredibleEmployment.be  This test is no t yet approved or cleared by the [place] FDA and  has been authorized for detection and/or diagnosis of SARS-CoV-2 by FDA under an Emergency Use Authorization (EUA). This EUA will remain  in effect (meaning this test can be used) for the duration of the COVID-19 declaration under Section 564(b)(1) of the Act, 21 U.S.C.section 360bbb-3(b)(1), unless the authorization is terminated  or revoked sooner.       Influenza A by PCR NEGATIVE NEGATIVE Final   Influenza B by PCR NEGATIVE NEGATIVE Final    Comment: (NOTE) The Xpert Xpress SARS-CoV-2/FLU/RSV plus assay is intended as an aid in the diagnosis of influenza from Nasopharyngeal swab specimens and should not be used as a sole basis for treatment. Nasal washings and aspirates are unacceptable for Xpert Xpress SARS-CoV-2/FLU/RSV testing.  Fact Sheet for Patients: EntrepreneurPulse.com.au  Fact Sheet for Healthcare  Providers: IncredibleEmployment.be  This test is not yet approved or cleared by the [place] FDA and has been authorized for detection and/or diagnosis of SARS-CoV-2 by FDA under an Emergency Use Authorization (EUA). This EUA will remain in effect (meaning this test can be used) for the duration of the COVID-19 declaration under Section 564(b)(1) of the Act, 21 U.S.C. section 360bbb-3(b)(1),  unless the authorization is terminated or revoked.  Performed at [facility], [place] [address] [place], [place] [address]   Fungus culture, blood     Status: None (Preliminary result)   Collection Time: 06/05/21  1:43 PM   Specimen: BLOOD RIGHT HAND  Result Value Ref Range Status   Specimen Description BLOOD RIGHT HAND  Final   Special Requests   Final    BOTTLES DRAWN AEROBIC ONLY Blood Culture results may not be optimal due to an inadequate volume of blood received in culture bottles   Culture   Final    NO GROWTH 3 DAYS Performed at [facility], [place] [address], [place], [place] [address]    Report Status PENDING  Incomplete  Culture, blood (single)     Status: None (Preliminary result)   Collection Time: 06/06/21  7:33 AM   Specimen: BLOOD  Result Value Ref Range Status   Specimen Description BLOOD RIGHT ANTECUBITAL  Final   Special Requests   Final    BOTTLES DRAWN AEROBIC AND ANAEROBIC Blood Culture adequate volume   Culture   Final    NO GROWTH 2 DAYS Performed at [facility], [place] [address] [place], [place] [address]    Report Status PENDING  Incomplete      RN Pressure Injury Documentation:     Estimated body mass index is 22.78 kg/m as calculated from the following:   Height as of this encounter: 5\' 10"  (1.778 m).   Weight as of this encounter: 72 kg.  Malnutrition Type:  Nutrition Problem: Severe Malnutrition Etiology: chronic illness (Alzheimers dementia, DM, CKD, CHF)   Malnutrition Characteristics:  Signs/Symptoms:  moderate fat depletion, severe fat depletion, moderate muscle depletion, severe muscle depletion, percent weight loss Percent weight loss: 23 %   Nutrition Interventions:  Interventions: Tube feeding, MVI, Prostat   Radiology Studies: MR BRAIN WO CONTRAST  Result Date: 06/06/2021 CLINICAL DATA:  Stroke follow-up EXAM: MRI HEAD WITHOUT CONTRAST TECHNIQUE: Multiplanar, multiecho pulse sequences of the brain and surrounding structures were obtained without intravenous contrast. COMPARISON:  05/29/2021 FINDINGS: Brain: Slightly decreased size of intraparenchymal hematoma centered in the right thalamus. Persistent intraventricular extension of blood products. There are numerous chronic microhemorrhages in a predominantly central distribution. There is mild edema surrounding the hemorrhage. Early confluent hyperintense T2-weighted signal of the periventricular and deep white matter, most commonly due to chronic ischemic microangiopathy. Mild volume loss. Old small vessel infarct of the left pons. The midline structures are normal. Vascular: Major flow voids are preserved. Skull and upper cervical spine: Normal calvarium and skull base. Visualized upper cervical spine and soft tissues are normal. Sinuses/Orbits:No paranasal sinus fluid levels or advanced mucosal thickening. No mastoid or middle ear effusion. Normal orbits. IMPRESSION: 1. Slightly decreased size of right thalamic intraparenchymal hematoma with intraventricular extension. 2. Numerous chronic microhemorrhages in a predominantly central distribution, consistent with chronic hypertensive angiopathy. 3. Chronic ischemic microangiopathy. Electronically Signed   By: Ulyses Jarred M.D.   On: 06/06/2021 22:00   DG CHEST PORT 1 VIEW  Result Date: 06/07/2021 CLINICAL DATA:  Shortness of breath EXAM: PORTABLE CHEST 1 VIEW COMPARISON:  06/06/2021 FINDINGS: Feeding tube coursing below the diaphragm with the tip excluded from the field of view. No focal  consolidation. No pleural effusion or pneumothorax. Heart and mediastinal contours are unremarkable. No acute osseous abnormality. IMPRESSION: No acute cardiopulmonary disease. Electronically Signed   By: Kathreen Devoid   On: 06/07/2021 13:02   DG Swallowing  Func-Speech Pathology  Result Date: 06/08/2021 Formatting of this result is different from the original. Objective Swallowing Evaluation: Type of Study: MBS-Modified Barium Swallow Study  Patient Details Name: Murlin Schrieber MRN: [id number] Date of Birth: [birth year] Today's Date: 06/08/2021 Time: SLP Start Time (ACUTE ONLY): 1422 -SLP Stop Time (ACUTE ONLY): 1435 SLP Time Calculation (min) (ACUTE ONLY): 13 min Past Medical History: Past Medical History: Diagnosis Date  Anemia of chronic disease 03/28/2018  Ascending aorta dilation 07/29/2019  (a) Echo 07/29/19 73mm  Bleeding per rectum 12/09/2018  CKD (chronic kidney disease) stage 3, GFR 30-59 ml/min ([place]) 10/13/2014  Discussed with patient importance of good BP control, avoiding nephrotoxic meds/supplements in preventing further progression  Constipation 12/01/2018  Corn or callus 06/11/2019  Decreased appetite   Diastolic congestive heart failure ([place]) 02/03/2021  DOE (dyspnea on exertion) 06/17/2020  Dysrhythmia   Edema 07/09/2016  Elevated hemoglobin A1c 06/11/2019  Essential hypertension 06/09/2014  Overview:  metoprolol caused bradycardia amlodipine causes headache, leg swelling  Last Assessment & Plan:  We had a long discussion regarding goals of treatment of HTN to prevent worsening cardiac and renal disease.  We also discussed options to change his regimen.  He states that he would prefer to seek a practitioner who can recommend herbal remedies as he feels thhis will be a healthier path f  Hepatitis C virus infection cured after antiviral drug therapy 07/05/2014  History of gout   History of urinary frequency 12/05/2020  Hyperlipidemia 06/29/2014  Hypokalemia 12/09/2018  Laryngeal mass 06/17/2020  Localized  osteoarthritis of left knee 06/11/2020  Left knee injection-06/10/2020  LVH (left ventricular hypertrophy) due to hypertensive disease   (a) echo 07/29/19 severe concentric LVH  Major depressive disorder 02/27/2016  Mass of thyroid region 03/28/2018  Mild neurocognitive disorder due to multiple etiologies 12/30/2020  Nonsustained ventricular tachycardia 07/23/2019  (a) Zio monitor 06/2019. Subsequent echo with severe concentric LVH, but normal EF 55-60%  Onychomycosis 12/01/2013  PAC (premature atrial contraction)   (a) ZIO 06/2019. Asymptomatic  Prostate cancer 1997  Dx in 1997, never treated, taking herbal supplements. S/p prostatectomy 2017  PVD (peripheral vascular disease) 12/01/2013  Stroke   Per 12/26/20 MRI - remote pontine and bilateral thalamic infarcts   Urinary retention  Past Surgical History: Past Surgical History: Procedure Laterality Date  ACHILLES TENDON REPAIR    CERVICAL DISC SURGERY    neck  COLONOSCOPY    before 2010   LYMPHADENECTOMY Bilateral 01/26/2016  Procedure: EXTENDED BILATERAL PELVIC LYMPHADENECTOMY;  Surgeon: Raynelle Bring, MD;  Location: [facility];  Service: Urology;  Laterality: Bilateral;  NOSE SURGERY    in high school-nasal fracturex2  ROBOT ASSISTED LAPAROSCOPIC RADICAL PROSTATECTOMY N/A 01/26/2016  Procedure: XI ROBOTIC ASSISTED LAPAROSCOPIC RADICAL PROSTATECTOMY LEVEL 3;  Surgeon: Raynelle Bring, MD;  Location: [facility];  Service: Urology;  Laterality: N/A; HPI: 70 yr old admitted with LUE and LLE weakness. CT revealed acute right thalamic hemorrhage. 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 While pt was able to arouse with cueing, his overall sustained attention was diminished as compared to clinical interaction this am which suspected to contribute to worsened outcome. Pt with poor labial seal with diffuse oral residuals (worst with thicker  POs), anterior sulcus pocketing, decreased bolus cohesion, reduced posterior bolus propulsion, and frequent anterior spillage. Pt with mistimed epiglottic deflection, reduced pharyngeal stripping wave, poor base of tongue retraction, and diminished laryngeal sensation. This allowed for intermittent during the swallow laryngeal penetration of nectar thick liquids and post swallow spillage of residuals with all consistencies (thin, nectar, honey thick, and puree; (PAS- 3,5). Pt not able to consistently implement throat clear, cough, or volitional swallow to aid airway protection. While no appreciable aspiration exhibited during exam, pt very high risk for aspiration across a meal. Per MD report, pts family not ready for comfort diet with known aspiration risk. Pt to remain NPO with continued ST intervention and will likely pursue PEG. SLP to continue to follow. SLP Visit Diagnosis Dysphagia, oropharyngeal phase (R13.12) Attention and concentration deficit following -- Frontal lobe and executive function deficit following -- Impact on safety and function Moderate aspiration risk;Severe aspiration risk   [facility] IP TREATMENT RECOMMENDATION 06/08/2021 Treatment Recommendations Therapy as outlined in treatment plan below   Prognosis 06/08/2021 Prognosis for Safe Diet Advancement Good Barriers to Reach Goals Time post onset;Cognitive deficits Barriers/Prognosis Comment -- [facility] IP DIET RECOMMENDATION 06/08/2021 SLP Diet Recommendations NPO Liquid Administration via -- Medication Administration Via alternative means Compensations -- Postural Changes --   [facility] IP OTHER RECOMMENDATIONS 06/08/2021 Recommended Consults -- Oral Care Recommendations Oral care QID Other  Recommendations --   [facility] IP FOLLOW UP RECOMMENDATIONS 06/08/2021 Follow up Recommendations Inpatient Rehab;24 hour supervision/assistance   [facility] IP FREQUENCY AND DURATION 06/08/2021 Speech Therapy Frequency (ACUTE ONLY) min 2x/week Treatment Duration 2 weeks      [facility] IP ORAL PHASE 06/08/2021 Oral Phase Impaired Oral - Pudding Teaspoon -- Oral - Pudding Cup -- Oral - Honey Teaspoon Left anterior bolus loss;Right anterior bolus loss;Impaired mastication;Weak lingual manipulation;Incomplete tongue to palate contact;Reduced posterior propulsion;Holding of bolus;Pocketing in anterior sulcus;Lingual/palatal residue;Delayed oral transit;Decreased bolus cohesion Oral - Honey Cup Left anterior bolus loss;Right anterior bolus loss;Impaired mastication;Weak lingual manipulation;Incomplete tongue to palate contact;Reduced posterior propulsion;Holding of bolus;Pocketing in anterior sulcus;Lingual/palatal residue;Delayed oral transit;Decreased bolus cohesion Oral - Nectar Teaspoon -- Oral - Nectar Cup Left anterior bolus loss;Right anterior bolus loss;Weak lingual manipulation;Reduced posterior propulsion;Holding of bolus;Pocketing in anterior sulcus;Lingual/palatal residue;Delayed oral transit;Decreased bolus cohesion Oral - Nectar Straw -- Oral - Thin Teaspoon -- Oral - Thin Cup Delayed oral transit;Left anterior bolus loss;Right anterior bolus loss;Weak lingual manipulation;Incomplete tongue to palate contact;Reduced posterior propulsion;Holding of bolus;Pocketing in anterior sulcus;Lingual/palatal residue Oral - Thin Straw -- Oral - Puree Delayed oral transit;Decreased bolus cohesion;Weak lingual manipulation;Lingual pumping;Reduced posterior propulsion;Holding of bolus;Lingual/palatal residue;Pocketing in anterior sulcus Oral - Mech Soft -- Oral - Regular -- Oral - Multi-Consistency -- Oral - Pill -- Oral Phase - Comment --  [facility] IP PHARYNGEAL PHASE 06/08/2021 Pharyngeal Phase Impaired Pharyngeal- Pudding Teaspoon -- Pharyngeal --  Pharyngeal- Pudding Cup -- Pharyngeal -- Pharyngeal- Honey Teaspoon Delayed swallow initiation-vallecula;Reduced tongue base retraction;Pharyngeal residue - valleculae Pharyngeal -- Pharyngeal- Honey Cup Delayed swallow initiation-vallecula;Reduced pharyngeal peristalsis;Reduced epiglottic inversion;Reduced tongue base retraction;Penetration/Apiration after swallow;Pharyngeal residue - pyriform;Pharyngeal residue - valleculae;Reduced laryngeal elevation Pharyngeal Material enters airway, remains ABOVE vocal cords and not ejected out;Material enters airway, CONTACTS cords and not ejected out Pharyngeal- Nectar Teaspoon -- Pharyngeal -- Pharyngeal- Nectar Cup Penetration/Aspiration during swallow;Reduced laryngeal elevation;Reduced airway/laryngeal closure;Reduced tongue base retraction;Reduced pharyngeal peristalsis;Reduced epiglottic inversion;Reduced anterior laryngeal mobility;Pharyngeal residue - valleculae;Pharyngeal residue - pyriform Pharyngeal Material enters airway, remains ABOVE vocal cords and not ejected out;Material enters airway, CONTACTS cords and not ejected out Pharyngeal-  Nectar Straw -- Pharyngeal -- Pharyngeal- Thin Teaspoon -- Pharyngeal -- Pharyngeal- Thin Cup Reduced epiglottic inversion;Reduced pharyngeal peristalsis;Reduced anterior laryngeal mobility;Reduced laryngeal elevation;Reduced airway/laryngeal closure;Reduced tongue base retraction;Penetration/Apiration after swallow;Pharyngeal residue - valleculae;Pharyngeal residue - pyriform Pharyngeal Material enters airway, remains ABOVE vocal cords and not ejected out;Material enters airway, CONTACTS cords and not ejected out Pharyngeal- Thin Straw -- Pharyngeal -- Pharyngeal- Puree Delayed swallow initiation-vallecula;Reduced pharyngeal peristalsis;Reduced epiglottic inversion;Reduced anterior laryngeal mobility;Reduced laryngeal elevation;Reduced tongue base retraction;Penetration/Apiration after swallow;Pharyngeal residue -  valleculae;Pharyngeal residue - pyriform Pharyngeal Material enters airway, remains ABOVE vocal cords and not ejected out;Material enters airway, CONTACTS cords and not ejected out Pharyngeal- Mechanical Soft -- Pharyngeal -- Pharyngeal- Regular -- Pharyngeal -- Pharyngeal- Multi-consistency -- Pharyngeal -- Pharyngeal- Pill -- Pharyngeal -- Pharyngeal Comment --  CHL IP CERVICAL ESOPHAGEAL PHASE 06/08/2021 Cervical Esophageal Phase WFL Pudding Teaspoon -- Pudding Cup -- Honey Teaspoon -- Honey Cup -- Nectar Teaspoon -- Nectar Cup -- Nectar Straw -- Thin Teaspoon -- Thin Cup -- Thin Straw -- Puree -- Mechanical Soft -- Regular -- Multi-consistency -- Pill -- Cervical Esophageal Comment -- Chelsea E Hartness MA, CCC-SLP 06/08/2021, 3:18 PM              EEG adult  Result Date: 06/06/2021 Lora Havens, MD     06/06/2021  8:24 PM Patient Name: Sota Hetz MRN: [id number] Epilepsy Attending: Lora Havens Referring Physician/Provider: Dr Oren Binet Date: 06/06/2021 Duration: 23.05 mins Patient history: 70yo M with ams. EEG to evaluate for seizure. Level of alertness:  lethargic, asleep AEDs during EEG study: None Technical aspects: This EEG study was done with scalp electrodes positioned according to the 10-20 International system of electrode placement. Electrical activity was acquired at a sampling rate of 500Hz  and reviewed with a high frequency filter of 70Hz  and a low frequency filter of 1Hz . EEG data were recorded continuously and digitally stored. Description: No posterior dominant rhythm was seen. Sleep was characterized by sleep spindles (12-14hz 0, maximal frontocentral region. EEG showed continuous/ generalized 5 to 7 Hz theta as well as intermittent 2-3hz  delta slowing. Hyperventilation and photic stimulation were not performed.   ABNORMALITY - Continuous slow, generalized IMPRESSION: This study is suggestive of moderate diffuse encephalopathy, nonspecific etiology. No seizures or epileptiform  discharges were seen throughout the recording. Priyanka O Yadav   VAS [place] LOWER EXTREMITY VENOUS (DVT)  Result Date: 06/07/2021  Lower Venous DVT Study Patient Name:  VANG KRAEGER  Date of Exam:   06/07/2021 Medical Rec #: [id number]      Accession #:    [id number] Date of Birth: 15-Apr-1951      Patient Gender: M Patient Age:   70Y Exam Location:  [facility] Procedure:      VAS [place] LOWER EXTREMITY VENOUS (DVT) Referring Phys: [id number] [facility] --------------------------------------------------------------------------------  Indications: Edema.  Comparison Study: No prior study Performing Technologist: Maudry Mayhew MHA, RDMS, RVT, RDCS  Examination Guidelines: A complete evaluation includes B-mode imaging, spectral Doppler, color Doppler, and power Doppler as needed of all accessible portions of each vessel. Bilateral testing is considered an integral part of a complete examination. Limited examinations for reoccurring indications may be performed as noted. The reflux portion of the exam is performed with the patient in reverse Trendelenburg.  +---------+---------------+---------+-----------+----------+--------------+ RIGHT    CompressibilityPhasicitySpontaneityPropertiesThrombus Aging +---------+---------------+---------+-----------+----------+--------------+ CFV      Full           Yes      Yes                                 +---------+---------------+---------+-----------+----------+--------------+  SFJ      Full                                                        +---------+---------------+---------+-----------+----------+--------------+ FV Prox  Full                                                        +---------+---------------+---------+-----------+----------+--------------+ FV Mid   Full                                                        +---------+---------------+---------+-----------+----------+--------------+ FV DistalFull                                                         +---------+---------------+---------+-----------+----------+--------------+ PFV      Full                                                        +---------+---------------+---------+-----------+----------+--------------+ POP      Full           Yes      Yes                                 +---------+---------------+---------+-----------+----------+--------------+ PTV      Full                                                        +---------+---------------+---------+-----------+----------+--------------+ PERO     Full                                                        +---------+---------------+---------+-----------+----------+--------------+   +---------+---------------+---------+-----------+----------+--------------+ LEFT     CompressibilityPhasicitySpontaneityPropertiesThrombus Aging +---------+---------------+---------+-----------+----------+--------------+ CFV      Full           Yes      Yes                                 +---------+---------------+---------+-----------+----------+--------------+ SFJ      Full                                                        +---------+---------------+---------+-----------+----------+--------------+  FV Prox  Full                                                        +---------+---------------+---------+-----------+----------+--------------+ FV Mid   Full                                                        +---------+---------------+---------+-----------+----------+--------------+ FV DistalFull                                                        +---------+---------------+---------+-----------+----------+--------------+ PFV      Full                                                        +---------+---------------+---------+-----------+----------+--------------+ POP      Full           Yes      Yes                                  +---------+---------------+---------+-----------+----------+--------------+ PTV      Full                                                        +---------+---------------+---------+-----------+----------+--------------+ PERO     Full                                                        +---------+---------------+---------+-----------+----------+--------------+     Summary: RIGHT: - There is no evidence of deep vein thrombosis in the lower extremity.  - No cystic structure found in the popliteal fossa.  LEFT: - There is no evidence of deep vein thrombosis in the lower extremity.  - No cystic structure found in the popliteal fossa.  *See table(s) above for measurements and observations. Electronically signed by Jamelle Haring on 06/07/2021 at 7:20:06 PM.    Final     Scheduled Meds:  carvedilol  25 mg Per NG tube BID WC   chlorhexidine  15 mL Mouth Rinse BID   Chlorhexidine Gluconate Cloth  6 each Topical Q0600   feeding supplement (PROSource TF)  45 mL Per Tube BID   free water  400 mL Per Tube Q6H   heparin injection (subcutaneous)  5,000 Units Subcutaneous Q8H   hydrALAZINE  100 mg Per Tube TID   mouth rinse  15 mL Mouth Rinse q12n4p   multivitamin with minerals  1 tablet Per Tube Daily  pantoprazole sodium  40 mg Per Tube QHS   senna-docusate  1 tablet Per Tube BID   sodium chloride flush  3 mL Intravenous Once   Continuous Infusions:  dextrose 50 mL/hr at 06/07/21 0830   feeding supplement (OSMOLITE 1.5 CAL) 1,000 mL (06/06/21 1800)    LOS: 11 days   Kerney Elbe, DO [facility] PAGER is on AMION  If 7PM-7AM, please contact night-coverage [email or web address]

## 2021-06-08 NOTE — Progress Notes (Signed)
OT Cancellation Note  Patient Details Name: Casey Greene MRN: [id number] DOB: 14-Mar-1951   Cancelled Treatment:    Reason Eval/Treat Not Completed: Patient's level of consciousness.  Just completed ST and very lethargic.  OT to continue efforts as appropriate.    Essica Kiker D Yaacov Koziol 06/08/2021, 3:34 PM

## 2021-06-09 ENCOUNTER — Inpatient Hospital Stay (HOSPITAL_COMMUNITY): Payer: Medicare Other

## 2021-06-09 ENCOUNTER — Ambulatory Visit: Payer: Medicare Other | Admitting: Family Medicine

## 2021-06-09 DIAGNOSIS — Z515 Encounter for palliative care: Secondary | ICD-10-CM | POA: Diagnosis not present

## 2021-06-09 DIAGNOSIS — Z7189 Other specified counseling: Secondary | ICD-10-CM | POA: Diagnosis not present

## 2021-06-09 DIAGNOSIS — T17908A Unspecified foreign body in respiratory tract, part unspecified causing other injury, initial encounter: Secondary | ICD-10-CM | POA: Diagnosis not present

## 2021-06-09 DIAGNOSIS — I610 Nontraumatic intracerebral hemorrhage in hemisphere, subcortical: Secondary | ICD-10-CM | POA: Diagnosis not present

## 2021-06-09 DIAGNOSIS — E43 Unspecified severe protein-calorie malnutrition: Secondary | ICD-10-CM | POA: Diagnosis not present

## 2021-06-09 DIAGNOSIS — T17908D Unspecified foreign body in respiratory tract, part unspecified causing other injury, subsequent encounter: Secondary | ICD-10-CM | POA: Diagnosis not present

## 2021-06-09 DIAGNOSIS — G9341 Metabolic encephalopathy: Secondary | ICD-10-CM | POA: Diagnosis not present

## 2021-06-09 HISTORY — PX: IR GASTROSTOMY TUBE MOD SED: IMG625

## 2021-06-09 LAB — GLUCOSE, CAPILLARY
Glucose-Capillary: 111 mg/dL — ABNORMAL HIGH (ref 70–99)
Glucose-Capillary: 114 mg/dL — ABNORMAL HIGH (ref 70–99)
Glucose-Capillary: 121 mg/dL — ABNORMAL HIGH (ref 70–99)
Glucose-Capillary: 123 mg/dL — ABNORMAL HIGH (ref 70–99)
Glucose-Capillary: 128 mg/dL — ABNORMAL HIGH (ref 70–99)
Glucose-Capillary: 143 mg/dL — ABNORMAL HIGH (ref 70–99)

## 2021-06-09 LAB — CBC WITH DIFFERENTIAL/PLATELET
Abs Immature Granulocytes: 0.08 10*3/uL — ABNORMAL HIGH (ref 0.00–0.07)
Basophils Absolute: 0 10*3/uL (ref 0.0–0.1)
Basophils Relative: 0 %
Eosinophils Absolute: 0 10*3/uL (ref 0.0–0.5)
Eosinophils Relative: 0 %
HCT: 23.5 % — ABNORMAL LOW (ref 39.0–52.0)
Hemoglobin: 7.5 g/dL — ABNORMAL LOW (ref 13.0–17.0)
Immature Granulocytes: 1 %
Lymphocytes Relative: 5 %
Lymphs Abs: 0.4 10*3/uL — ABNORMAL LOW (ref 0.7–4.0)
MCH: 31.9 pg (ref 26.0–34.0)
MCHC: 31.9 g/dL (ref 30.0–36.0)
MCV: 100 fL (ref 80.0–100.0)
Monocytes Absolute: 0.5 10*3/uL (ref 0.1–1.0)
Monocytes Relative: 6 %
Neutro Abs: 7.4 10*3/uL (ref 1.7–7.7)
Neutrophils Relative %: 88 %
Platelets: 204 10*3/uL (ref 150–400)
RBC: 2.35 MIL/uL — ABNORMAL LOW (ref 4.22–5.81)
RDW: 13.4 % (ref 11.5–15.5)
WBC: 8.5 10*3/uL (ref 4.0–10.5)
nRBC: 0 % (ref 0.0–0.2)

## 2021-06-09 LAB — COMPREHENSIVE METABOLIC PANEL
ALT: 15 U/L (ref 0–44)
AST: 33 U/L (ref 15–41)
Albumin: 2.4 g/dL — ABNORMAL LOW (ref 3.5–5.0)
Alkaline Phosphatase: 31 U/L — ABNORMAL LOW (ref 38–126)
Anion gap: 6 (ref 5–15)
BUN: 94 mg/dL — ABNORMAL HIGH (ref 8–23)
CO2: 24 mmol/L (ref 22–32)
Calcium: 10.8 mg/dL — ABNORMAL HIGH (ref 8.9–10.3)
Chloride: 113 mmol/L — ABNORMAL HIGH (ref 98–111)
Creatinine, Ser: 2.71 mg/dL — ABNORMAL HIGH (ref 0.61–1.24)
GFR, Estimated: 24 mL/min — ABNORMAL LOW (ref >60)
Glucose, Bld: 121 mg/dL — ABNORMAL HIGH (ref 70–99)
Potassium: 3.9 mmol/L (ref 3.5–5.1)
Sodium: 143 mmol/L (ref 135–145)
Total Bilirubin: 0.5 mg/dL (ref 0.3–1.2)
Total Protein: 5.8 g/dL — ABNORMAL LOW (ref 6.5–8.1)

## 2021-06-09 LAB — PHOSPHORUS: Phosphorus: 4 mg/dL (ref 2.5–4.6)

## 2021-06-09 LAB — MAGNESIUM: Magnesium: 2.3 mg/dL (ref 1.7–2.4)

## 2021-06-09 MED ORDER — CEFAZOLIN SODIUM-DEXTROSE 2-4 GM/100ML-% IV SOLN
2.0000 g | INTRAVENOUS | Status: AC
  Administered 2021-06-09: 2 g via INTRAVENOUS
  Filled 2021-06-09: qty 100

## 2021-06-09 MED ORDER — LIDOCAINE HCL (PF) 1 % IJ SOLN
INTRAMUSCULAR | Status: AC | PRN
  Administered 2021-06-09: 10 mL

## 2021-06-09 MED ORDER — CEFAZOLIN SODIUM-DEXTROSE 2-4 GM/100ML-% IV SOLN
2.0000 g | INTRAVENOUS | Status: AC
  Filled 2021-06-09 (×2): qty 100

## 2021-06-09 MED ORDER — IOHEXOL 300 MG/ML  SOLN
50.0000 mL | Freq: Once | INTRAMUSCULAR | Status: AC | PRN
  Administered 2021-06-09: 15 mL

## 2021-06-09 MED ORDER — FENTANYL CITRATE (PF) 100 MCG/2ML IJ SOLN
INTRAMUSCULAR | Status: AC
  Filled 2021-06-09: qty 2

## 2021-06-09 MED ORDER — MIDAZOLAM HCL 2 MG/2ML IJ SOLN
INTRAMUSCULAR | Status: AC | PRN
  Administered 2021-06-09: 1 mg via INTRAVENOUS

## 2021-06-09 MED ORDER — FENTANYL CITRATE (PF) 100 MCG/2ML IJ SOLN
INTRAMUSCULAR | Status: AC | PRN
  Administered 2021-06-09: 25 ug via INTRAVENOUS

## 2021-06-09 MED ORDER — LIDOCAINE HCL 1 % IJ SOLN
INTRAMUSCULAR | Status: AC
  Filled 2021-06-09: qty 20

## 2021-06-09 MED ORDER — MIDAZOLAM HCL 2 MG/2ML IJ SOLN
INTRAMUSCULAR | Status: AC
  Filled 2021-06-09: qty 2

## 2021-06-09 MED ORDER — CEFAZOLIN SODIUM-DEXTROSE 1-4 GM/50ML-% IV SOLN
1.0000 g | INTRAVENOUS | Status: DC
  Filled 2021-06-09: qty 50

## 2021-06-09 NOTE — Progress Notes (Signed)
[facility] Infectious Diseases Follow Up Note  Patient Identification: Patient Name: Casey Greene MRN: [id number] [place] Date: 05/28/2021  5:29 PM Age: 70 y.o.Today's Date: 06/09/2021   Reason for Visit: Fevers   Active Problems:   ICH (intracerebral hemorrhage) (HCC)   Protein-calorie malnutrition, severe  Antibiotics: Ampsulbactam 7/7-current    Lines/Tubes: External Urinary Catheter, Feeding tube   Interval Events: continues to be afebrile, no leukocytosis. Plan for PEG tube noted   Assessment Fevers, resolved  DD aspiration, central fevers in the setting of ICH, Vascular US of LE no evidence of DVT  No reported diarrhea  Unlikely to be UTI given unremarkable UA although CT with non specific bilateral perinephric stranding with some urothelial and circumferential bladder wall thickening  7/7 Blood cx no growth in 5 days  7/12 blood cx NG in 2 day  CT and MRI brain with decreasing size of the intraparenchymal hematoma  Encephalopathy: he was able to communicate somewhat better today  CT and MRI brain with decreasing size of the intraparenchymal hematoma. EE with non specific moderate diffuse encephalopathy   Acute right thalamic hemorrhage w right ventricular extension and hydrocephalus 2 small acute infarcts of the inferior right parietal lobe   Hepatitic C , treated   Recommendations Patient has completed 7 days course of presumed aspiration PNA. DC antibiotics  Aspiration precautions  I will sign off for now. Please call with questions   Rest of the management as per the primary team. Thank you for the consult. Please page with pertinent questions or concerns.  ______________________________________________________________________ Subjective patient seen and examined at the bedside. He denies having any pain or having discomfort  Vitals BP 139/85 (BP Location: Right Arm)   Pulse 84   Temp 98.8 F (37.1 C)  (Axillary)   Resp 17   Ht 5\' 10"  (1.778 m)   Wt 72 kg   SpO2 100%   BMI 22.78 kg/m     Physical Exam Constitutional:  lying in bed, not in acute distress, eye open     Comments: Left facial droop   Cardiovascular:     Rate and Rhythm: Normal rate and regular rhythm.     Heart sounds:   Pulmonary:     Effort: Pulmonary effort is normal.     Comments:   Abdominal:     Palpations: Abdomen is soft.     Tenderness: ventral hernia +, non tender and non distended   Musculoskeletal:        General: No swelling or tenderness.   Skin:    Comments: No lesions or rashes   Neurological:     General: Left sided weakness   Psychiatric:        Mood and Affect: unable to assess  Pertinent Microbiology Results for orders placed or performed during the hospital encounter of 05/28/21  MRSA Next Gen by PCR, Nasal     Status: None   Collection Time: 05/28/21  8:13 PM   Specimen: Nasal Mucosa; Nasal Swab  Result Value Ref Range Status   MRSA by PCR Next Gen NOT DETECTED NOT DETECTED Final    Comment: (NOTE) The GeneXpert MRSA Assay (FDA approved for NASAL specimens only), is one component of a comprehensive MRSA colonization surveillance program. It is not intended to diagnose MRSA infection nor to guide or monitor treatment for MRSA infections. Test performance is not FDA approved in patients less than 4 years old. Performed at [facility], [place] [address], [place], [place] [address]  Culture, blood (routine x 2)     Status: None   Collection Time: 06/01/21  8:14 AM   Specimen: BLOOD  Result Value Ref Range Status   Specimen Description BLOOD RIGHT ANTECUBITAL  Final   Special Requests   Final    BOTTLES DRAWN AEROBIC AND ANAEROBIC Blood Culture adequate volume   Culture   Final    NO GROWTH 5 DAYS Performed at [facility], [address], [place], [place] [address]    Report Status 06/06/2021 FINAL  Final  Culture, blood (routine x 2)     Status: None    Collection Time: 06/01/21  8:31 AM   Specimen: BLOOD RIGHT HAND  Result Value Ref Range Status   Specimen Description BLOOD RIGHT HAND  Final   Special Requests   Final    BOTTLES DRAWN AEROBIC AND ANAEROBIC Blood Culture adequate volume   Culture   Final    NO GROWTH 5 DAYS Performed at [facility], [place] [address], [place], [place] [address]    Report Status 06/06/2021 FINAL  Final  Resp Panel by RT-PCR (Flu A&B, Covid) Nasopharyngeal Swab     Status: None   Collection Time: 06/01/21  2:02 PM   Specimen: Nasopharyngeal Swab; Nasopharyngeal(NP) swabs in vial transport medium  Result Value Ref Range Status   SARS Coronavirus 2 by RT PCR NEGATIVE NEGATIVE Final    Comment: (NOTE) SARS-CoV-2 target nucleic acids are NOT DETECTED.  The SARS-CoV-2 RNA is generally detectable in upper respiratory specimens during the acute phase of infection. The lowest concentration of SARS-CoV-2 viral copies this assay can detect is 138 copies/mL. A negative result does not preclude SARS-Cov-2 infection and should not be used as the sole basis for treatment or other patient management decisions. A negative result may occur with  improper specimen collection/handling, submission of specimen other than nasopharyngeal swab, presence of viral mutation(s) within the areas targeted by this assay, and inadequate number of viral copies(<138 copies/mL). A negative result must be combined with clinical observations, patient history, and epidemiological information. The expected result is Negative.  Fact Sheet for Patients:  EntrepreneurPulse.com.au  Fact Sheet for Healthcare Providers:  IncredibleEmployment.be  This test is no t yet approved or cleared by the [place] FDA and  has been authorized for detection and/or diagnosis of SARS-CoV-2 by FDA under an Emergency Use Authorization (EUA). This EUA will remain  in effect (meaning this test can be used) for  the duration of the COVID-19 declaration under Section 564(b)(1) of the Act, 21 U.S.C.section 360bbb-3(b)(1), unless the authorization is terminated  or revoked sooner.       Influenza A by PCR NEGATIVE NEGATIVE Final   Influenza B by PCR NEGATIVE NEGATIVE Final    Comment: (NOTE) The Xpert Xpress SARS-CoV-2/FLU/RSV plus assay is intended as an aid in the diagnosis of influenza from Nasopharyngeal swab specimens and should not be used as a sole basis for treatment. Nasal washings and aspirates are unacceptable for Xpert Xpress SARS-CoV-2/FLU/RSV testing.  Fact Sheet for Patients: EntrepreneurPulse.com.au  Fact Sheet for Healthcare Providers: IncredibleEmployment.be  This test is not yet approved or cleared by the [place] FDA and has been authorized for detection and/or diagnosis of SARS-CoV-2 by FDA under an Emergency Use Authorization (EUA). This EUA will remain in effect (meaning this test can be used) for the duration of the COVID-19 declaration under Section 564(b)(1) of the Act, 21 U.S.C. section 360bbb-3(b)(1), unless the authorization is terminated or revoked.  Performed at [facility], [place] [address], [place], [place] [address]   Fungus culture, blood     Status: None (Preliminary result)   Collection Time: 06/05/21  1:43 PM   Specimen: BLOOD RIGHT HAND  Result Value Ref Range Status   Specimen Description BLOOD RIGHT HAND  Final   Special Requests   Final    BOTTLES DRAWN AEROBIC ONLY Blood Culture results may not be optimal due to an inadequate volume of blood received in culture bottles   Culture   Final    NO GROWTH 3 DAYS Performed at [facility], [place] [address] [place], [place] [address]    Report Status PENDING  Incomplete  Culture, blood (single)     Status: None (Preliminary result)   Collection Time: 06/06/21  7:33 AM   Specimen: BLOOD  Result Value Ref Range Status   Specimen Description BLOOD  RIGHT ANTECUBITAL  Final   Special Requests   Final    BOTTLES DRAWN AEROBIC AND ANAEROBIC Blood Culture adequate volume   Culture   Final    NO GROWTH 2 DAYS Performed at [facility], [place] [address], [place],  [address]    Report Status PENDING  Incomplete     Pertinent Lab. CBC Latest Ref Rng & Units 06/09/2021 06/08/2021 06/07/2021  WBC 4.0 - 10.5 K/uL 8.5 8.3 10.2  Hemoglobin 13.0 - 17.0 g/dL 7.5(L) 7.3(L) 8.9(L)  Hematocrit 39.0 - 52.0 % 23.5(L) 22.8(L) 27.8(L)  Platelets 150 - 400 K/uL 204 177 191   CMP Latest Ref Rng & Units 06/09/2021 06/08/2021 06/07/2021  Glucose 70 - 99 mg/dL 121(H) 149(H) 148(H)  BUN 8 - 23 mg/dL 94(H) 90(H) 83(H)  Creatinine 0.61 - 1.24 mg/dL 2.71(H) 2.85(H) 2.76(H)  Sodium 135 - 145 mmol/L 143 144 148(H)  Potassium 3.5 - 5.1 mmol/L 3.9 3.6 3.7  Chloride 98 - 111 mmol/L 113(H) 113(H) 116(H)  CO2 22 - 32 mmol/L 24 25 27   Calcium 8.9 - 10.3 mg/dL 10.8(H) 10.7(H) 11.2(H)  Total Protein 6.5 - 8.1 g/dL 5.8(L) 5.4(L) -  Total Bilirubin 0.3 - 1.2 mg/dL 0.5 0.6 -  Alkaline Phos 38 - 126 U/L 31(L) 31(L) -  AST 15 - 41 U/L 33 21 -  ALT 0 - 44 U/L 15 12 -     Pertinent Imaging today Plain films and CT images have been personally visualized and interpreted; radiology reports have been reviewed. Decision making incorporated into the Impression / Recommendations.  MRI brain 06/06/21 IMPRESSION: 1. Slightly decreased size of right thalamic intraparenchymal hematoma with intraventricular extension. 2. Numerous chronic microhemorrhages in a predominantly central distribution, consistent with chronic hypertensive angiopathy. 3. Chronic ischemic microangiopathy.  CT head 06/06/21   IMPRESSION: 1. Slightly decreased right thalamic and intraventricular hemorrhage since last week. Stable mild regional mass effect. 2. No new intracranial abnormality.   I spent more than 35 minutes for this patient encounter including review of prior medical records,  coordination of care  with greater than 50% of time being face to face/counseling and discussing diagnostics/treatment plan with the patient/family.  Electronically signed by:   Rosiland Oz, MD Infectious Disease Physician [facility] for Infectious Disease Pager: [phone]

## 2021-06-09 NOTE — Plan of Care (Signed)
  Problem: Education: Goal: Knowledge of disease or condition will improve Outcome: Progressing Goal: Knowledge of secondary prevention will improve Outcome: Progressing Goal: Knowledge of patient specific risk factors addressed and post discharge goals established will improve Outcome: Progressing Goal: Individualized Educational Video(s) Outcome: Progressing   Problem: Coping: Goal: Will identify appropriate support needs Outcome: Progressing   Problem: Intracerebral Hemorrhage Tissue Perfusion: Goal: Complications of Intracerebral Hemorrhage will be minimized Outcome: Progressing

## 2021-06-09 NOTE — Procedures (Signed)
Interventional Radiology Procedure Note  Procedure: Placement of percutaneous 20F pull-through gastrostomy tube. Complications: None Recommendations: - NPO except for sips and chips remainder of today and overnight - Maintain G-tube to LWS until tomorrow morning  - May advance diet as tolerated and begin using tube tomorrow morning  Signed,   Daryel Kenneth S. Johniece Hornbaker, DO   

## 2021-06-09 NOTE — Plan of Care (Signed)
  Problem: Self-Care: Goal: Ability to communicate needs accurately will improve Outcome: Progressing   Problem: Nutrition: Goal: Risk of aspiration will decrease Outcome: Progressing Goal: Dietary intake will improve Outcome: Progressing

## 2021-06-09 NOTE — Progress Notes (Signed)
Interventional Radiology Brief Note:  IR following for possible gastrostomy tube placement.  Consult completed 7/12, consent signed and in chart.   Fever curve has improved over the past 24-48 hrs without current concerns for infection.  WBC 8.5.   Further discussion between Palliative Care and family has confirmed their plan to proceed with G-tube placement.   Will proceed as schedule allows.  Ancef ordered. TFs held p MN per RN.  Brynda Greathouse, MS RD [name]

## 2021-06-09 NOTE — Progress Notes (Signed)
Occupational Therapy Treatment Patient Details Name: Casey Greene MRN: [id number] DOB: Aug 06, 1951 Today's Date: 06/09/2021    History of present illness 29 male admitted to [facility] on 7/3 with R thalamic hemorrhage with intraventricular extension and hydrocephalus, no plan for neurosurgical intervention. PMH includes CKD stage III, hyperlipidemia, essential hypertension, LVH, mild neurocognitive disorder on Aricept, peripheral vascular disease, chronic diastolic heart failure, ACD, type 2 diabetes mellitus, and remote strokes identified on MRI in January 2022- remote pontine and bilateral thalamic infarcts and subacute infarcts identified on imaging in June 2022 on aspirin and clopidogrel.   OT comments  Pt seen this date with wife in room; OT spent extensive amount of time educating wife on POC and progress towards goals. Pt with improved behaviors and ability for participation, although observed to be fatigued by effort for repositioning and conversation. Continues with dense hemiparesis to LUE with ?trace contraction to L elbow but unable to replicate. Pt requiring total A for rolling B directions +2 present for safety and +2 total for toileting/peri care with presence of incontinence. OT reviewed positioning of LUE for edema management and improved attention to L environment. Ot placed pillow behind L trunk and buttocks to shift weight to L towards wife for improved attention. OT will continue to follow acutely   Follow Up Recommendations  SNF    Equipment Recommendations  3 in 1 bedside commode    Recommendations for Other Services Rehab consult    Precautions / Restrictions Precautions Precautions: Fall Precaution Comments: BP <140, cortrak       Mobility Bed Mobility Overal bed mobility: Needs Assistance Bed Mobility: Rolling Rolling: +2 for physical assistance;Total assist         General bed mobility comments: fair tolerance for rolling, need for dep A for susatined sidelying  positioning.    Transfers                      Balance                                           ADL either performed or assessed with clinical judgement   ADL Overall ADL's : Needs assistance/impaired     Grooming: Wash/dry face;Oral care;Total assistance;Bed level   Upper Body Bathing: Total assistance;Bed level       Upper Body Dressing : Total assistance;Bed level           Toileting- Clothing Manipulation and Hygiene: Total assistance;Bed level               Vision       Perception     Praxis      Cognition Arousal/Alertness: Lethargic Behavior During Therapy: Flat affect Overall Cognitive Status: Difficult to assess                                 General Comments: responsive to commands and general conversation but requiring increased time for response this date; but appropraite overall. fatigues easily.        Exercises     Shoulder Instructions       General Comments      Pertinent Vitals/ Pain       Pain Assessment: Faces Faces Pain Scale: Hurts a little bit Pain Location: L UE but unable to indicate specific location Pain Descriptors / Indicators:  Grimacing Pain Intervention(s): Limited activity within patient's tolerance;Monitored during session  Home Living                                          Prior Functioning/Environment              Frequency  Min 2X/week        Progress Toward Goals  OT Goals(current goals can now be found in the care plan section)  Progress towards OT goals: Progressing toward goals  Acute Rehab OT Goals Patient Stated Goal: did not state, lethargic OT Goal Formulation: With family Time For Goal Achievement: 06/13/21 Potential to Achieve Goals: [place] Discharge plan remains appropriate    Co-evaluation                 AM-PAC OT "6 Clicks" Daily Activity     Outcome Measure   Help from another person eating  meals?: Total Help from another person taking care of personal grooming?: Total Help from another person toileting, which includes using toliet, bedpan, or urinal?: Total Help from another person bathing (including washing, rinsing, drying)?: Total Help from another person to put on and taking off regular upper body clothing?: Total Help from another person to put on and taking off regular lower body clothing?: Total 6 Click Score: 6    End of Session    OT Visit Diagnosis: Unsteadiness on feet (R26.81);Muscle weakness (generalized) (M62.81)   Activity Tolerance Patient limited by lethargy   Patient Left with family/visitor present;with call bell/phone within reach;with bed alarm set;in bed   Nurse Communication Mobility status (present during session briefly to alert her of blood in stool)     Time: 2081-3887 OT Time Calculation (min): 34 min  Charges: OT General Charges $OT Visit: 1 Visit OT Treatments $Self Care/Home Management : 23-37 mins  Amanda Steuart OTR/L acute rehab services Office: [phone]   06/09/2021, 1:19 PM

## 2021-06-09 NOTE — Progress Notes (Signed)
PROGRESS NOTE    Casey Greene  MHD:[id number] DOB: Apr 13, 1951 DOA: 05/28/2021 PCP: [name], MD   Brief Narrative:  The patient is a 70 year old African-American male with a past medical history significant for but not limited to history of CVA, mild neurocognitive dysfunction, PAD, chronic diastolic CHF, diabetes mellitus type 2, hypertension, chronic kidney disease stage IV as well as other comorbidities who presented with left-sided hemiplegia and found to have an acute right thalamic hemorrhage.  He is admitted by neurology to the neurointensive care unit and stabilized and then subsequently transferred to the hospitalist service.  Upon 7 7 he became febrile and there was suspicion for microaspiration so he started on IV Unasyn.  Given his lack of improvement and his family's wishes for aggressive care interventional radiology was consulted for possible PEG tube placement.  Palliative care goals of care happening at this time and his mental status is not improving.  He is currently on aspirin and Plavix being given.  Throughout core track tube feeding.  Given his suspicion for aspiration pneumonia and his decreased mental status with ongoing copious secretions he was initiated on breathing treatments and ID was consulted.  He has a very poor prognosis and palliative care discussing with family about possible PEG tube placement.  Continuing goals of care discussion at this time.  Patient's current significant events and significant studies are as follows: 7/3>> presented with left-sided hemiplegia-right thalamic hemorrhage on CT-admit to neuro ICU 7/7>> transfer to [facility] 7/7>> febrile-suspicion for microaspiration-Unasyn started   Significant studies: 6/12>> TTE: EF 21-19%, grade 2 diastolic dysfunction. 7/3>> acute right thalamic hemorrhage with intraventricular extension and hydrocephalus. 7/3>> CT head: Unchanged size of intraparenchymal hemorrhage 7/4>> MRI brain: 2 small acute infarct  in rt parietal lobe, acute right thalamic hemorrhage with intraventricular extension. 7/4>> MRA brain: No significant stenosis or aneurysm 7/4>> MRA neck: No significant stenosis 7/4>> LDL: 50 7/4>> A1c: 5.1 7/7>> CXR: No pneumonia 7/8>> CXR: No pneumonia 7/9>> CT chest: Some scattered secretions in the central trachea/right mainstem bronchus-lungs are clear. 7/9>> CT abdomen: Diffusely fluid-filled appearance of the colon with lack of formed stools, complex multilobulated ventral hernia.  7/12>> CT head: Decreased right thalamic/intraventricular hemorrhage since last week 7/12>> CXR: No pneumonia 06/06/2021 patient had an EEG done which was suggestive of moderate diffuse encephalopathy with nonspecific etiology but there is no seizures or epileptiform discharges seen throughout the recording 06/07/21 patient remains lethargic 06/09/21 Mental Status is improving a little but still lethargic and plan is for PEG tube today  Assessment & Plan:   Active Problems:   ICH (intracerebral hemorrhage) (HCC)   Protein-calorie malnutrition, severe  Right thalamic intracranial hemorrhage with intraventricular extension and hydrocephalus: -Neurological deficits remain unchanged-left-sided hemiplegia/dysphagia persists -Neurology recommending to keep SBP less than 160-bit more lethargic today (repeat CT head stable/ABG without hypercarbia).   -Continue supportive care-continues to have dysphagia and accumulates secretion-family desires full scope of treatment (see palliative care note)-have consulted IR for PEG tube placement but after goals of care discussion the family with palliative they are currently-hold to reevaluate and assess -Will conider resuming Robinul for secretions -Continues to be hard to arouse (CThead/ABG done earlier this am)-coughing-grimaces to pain-per RN opens eyes at time. Dr. Sonia Side Spoke with Stroke MD-Dr Erlinda Hong yesterday who evaluated and recommended MRI Brain and EEG. -MRI Brain  showed "Slightly decreased size of right thalamic intraparenchymal hematoma with  intraventricular extension. Numerous chronic microhemorrhages in a predominantly central distribution, consistent with chronic hypertensive angiopathy. Chronic ischemic microangiopathy" -  EEG showed "This study is suggestive of moderate diffuse encephalopathy, nonspecific etiology. No seizures or epileptiform discharges were seen throughout the recording."  -Given issues with lethargy-stop both clonidine and Robinul (was already lethargic this am-when these agents were started)   Dysphagia:  Currently on NG tube feedings-continues to have significant dysphagia-have consulted IR for PEG tube placement  and Palliative discussing with family and family weighing risks vs. Benefits of PEG Placement  -Family wants PEG and all other life-prolonging measures per palliative discussion but patient is now DNR/DNI   Fever-suspicion for microaspiration:  Acute respiratory failure with hypoxia -High suspicion for microaspiration-accumulating secretions-although no parenchymal disease seen on imaging studies-has secretions/mucus in his airways (seen on CT chest).   -Doubt UTI-UA benign-even though he has some bilateral perinephric standing on CT.   -Suspect that this is either microaspiration-or central fever.  Appreciate ID input-recommendations are for total of 7 days of Unasyn.  IV Unasyn now stopped  -We will add Xopenex and Atrovent -ID recommending follow up on repeat Blood Cx on 06/06/21 and following Lower extremity Duplex -Aspiration Precautions   Episode of hematochezia on 7/9:  -CBC stable-none since then-plan is to watch closely for recurrence.  Hemoglobin relatively stable last few days however dropped to 7.3/22.8 today -Patient's hemoglobin/hematocrit has been relatively stable and gone from 8.5/26.9 -> 9.1/28.5 -> 8.9/27.8 -> 7.3/22.8 -> 7.5/23.5; if necessary will transfuse 1 unit PRBCs -Continue to Monitor for S/Sx  of Bleeding -Repeat CBC in the AM  HyperNatremia/Hyperchloremia -Improving Na+ is now 143 and now Chloride Level is 113; C/w Free Water Flushes -Continued IV fluid hydration from normal saline to D5W.  Now sodium level is now 143 and improved -Continue monitor and trend and repeat CMP in a.m.   HTN: -BP creeping up-goal SBP less than 160-added clonidine but has been held as above -continue Coreg/hydralazine.  Losartan on hold due to fluctuating renal function. -Continue to Monitor BP per Protocol -Last BP was 152/85   HLD -On statins prior to this hospital stay-resume when oral intake established.   HFpEF -Euvolemic on exam -Strict I's and O's and Daily Weights -Continue to Monitor Volume Status given that he is on IVF; Patient is -9.749 Liters    AKI CKD stage IV: AKI improving slowly after starting normal saline but will change to D5W.   -Continue to hold losartan.   -BUN/Cr went from 83/2.98 -> 81/2.72 -> 83/2.76 -> 90/2.85 -> 94/2.71 -No signs of urinary retention-check frequent bladder scans.   History of mild neurocognitive dysfunction -Resume Aricept when able.   Normocytic anemia:  -Due to CKD-no indication for transfusion at this time -follow CBC daily -Patient's hemoglobin/hematocrit has been relatively stable and gone from 8.5/26.9 -> 9.1/28.5 -> 8.9/27.8 and dropped to 7.3/22.8 -> 7.5/23.5; if necessary will transfuse 1 unit PRBCs -Continue to Monitor for S/Sx of Bleeding -Repeat CBC in the AM  Palliative care: Currently now DNR/DNI -Appreciate palliative care input-patient/family desires full scope of treatment and PEG and all life prolonging measures. Dr. Sloan Leiter has had repeated discussions with spouse-she is aware of the issue with aspiration pneumonia-and placing a PEG tube will not minimize risk of aspiration.   -Given ongoing lethargy-aspiration issues/accumulation of secretions-with lack of any significant improvement-he remains with poor prognosis-and at  risk for further decompensation.  He Spoke at length with spouse on 7/12 and relayed concerns againhave implored her to reexamine goals of care -Palliative Care reconsulted for GOC    Severe malnutrition in the context of chronic illness -  Evidenced by moderate fat depletion, severe fat depletion and 23% weight loss -Nutritionist consulted and currently on tube feedings with a multivitamin and prostat -Estimated body mass index is 22.71 kg/m as calculated from the following:   Height as of this encounter: 5\' 10"  (1.778 m).   Weight as of this encounter: 71.8 kg. -SLP evaluated and did MBS but patient failed miserably so will need PEG tube placement at this time given that he has oral deficits and moderate pharyngeal deficits. -We will pursue PEG tube placement and IR consulted and likely can be done today given that his Fever Curve improved over the last 24-48 hours  -Patient to have PEG placed as IR schedule allows   DVT prophylaxis: Heparin 5,000 sq q8h Code Status: FULL CODE  Family Communication: No family present at bedside  Disposition Plan: Pending further GOC Discussion and clinical improvement; will get a PEG tube likely tomorrow  Status is: Inpatient  Remains inpatient appropriate because:Unsafe d/c plan, IV treatments appropriate due to intensity of illness or inability to take PO, and Inpatient level of care appropriate due to severity of illness  Dispo: The patient is from: Home              Anticipated d/c is to:  TBD              Patient currently is not medically stable to d/c.   Difficult to place patient No  Consultants:  Palliative Care Neurology  PCCM   Procedures: As above  Antimicrobials:  Anti-infectives (From admission, onward)    Start     Dose/Rate Route Frequency Ordered Stop   06/09/21 0900  ceFAZolin (ANCEF) IVPB 2g/100 mL premix        2 g 200 mL/hr over 30 Minutes Intravenous On call 06/09/21 0814 06/10/21 0900   06/01/21 1500   Ampicillin-Sulbactam (UNASYN) 3 g in sodium chloride 0.9 % 100 mL IVPB        3 g 200 mL/hr over 30 Minutes Intravenous Every 12 hours 06/01/21 1402 06/08/21 0405       Subjective: Seen and examined at bedside and was a little lethargic again but more interactive today and did respond to his name and when asked in pain he states "a little bit".  Plan is for PEG tube placement given his continued dysphagia.  Objective: Vitals:   06/09/21 0408 06/09/21 0858 06/09/21 1223 06/09/21 1434  BP: 139/85 (!) 156/100 (!) 163/99 (!) 152/85  Pulse: 84 75  74  Resp: 17 16  12   Temp: 98.8 F (37.1 C)     TempSrc: Axillary     SpO2: 100% 100%  100%  Weight:      Height:        Intake/Output Summary (Last 24 hours) at 06/09/2021 1452 Last data filed at 06/09/2021 1400 Gross per 24 hour  Intake --  Output 1550 ml  Net -1550 ml    Filed Weights   05/31/21 0500 06/07/21 0635 06/08/21 0500  Weight: 71.4 kg 71.8 kg 72 kg   Examination: Physical Exam:  Constitutional: Chronically ill-appearing elderly African-American male currently in no acute distress he is still lethargic but much more responsive today and interactive than he was yesterday and states that he is" a little bit of pain" Eyes: Lids and conjunctivae normal, sclerae anicteric  ENMT: External Ears, Nose appear normal. Grossly normal hearing.  Neck: Appears normal, supple, no cervical masses, normal ROM, no appreciable thyromegaly; no JVD Respiratory: Diminished to auscultation bilaterally, no  wheezing, rales, rhonchi or crackles. Normal respiratory effort and patient is not tachypenic. No accessory muscle use. Unlabored breathing  Cardiovascular: RRR, no murmurs / rubs / gallops. S1 and S2 auscultated. No extremity edema.  Abdomen: Soft, non-tender, non-distended. Bowel sounds positive.  GU: Deferred. Musculoskeletal: No clubbing / cyanosis of digits/nails. No joint deformity upper and lower extremities.  Skin: No rashes, lesions,  ulcers on a limited skin evaluation. No induration; Warm and dry.  Neurologic: Patient is more interactive today and arouses to verbal stimuli Psychiatric: Impaired judgment and insight. Alert and oriented x 1. Normal mood and appropriate affect.   Data Reviewed: I have personally reviewed following labs and imaging studies  CBC: Recent Labs  Lab 06/05/21 0421 06/06/21 0327 06/07/21 0342 06/08/21 0240 06/09/21 0142  WBC 8.7 9.9 10.2 8.3 8.5  NEUTROABS  --   --   --  7.2 7.4  HGB 8.5* 9.1* 8.9* 7.3* 7.5*  HCT 26.9* 28.5* 27.8* 22.8* 23.5*  MCV 99.6 98.6 98.9 99.1 100.0  PLT 161 180 191 177 528    Basic Metabolic Panel: Recent Labs  Lab 06/05/21 0421 06/06/21 0327 06/07/21 0342 06/08/21 0240 06/09/21 0142  NA 146* 147* 148* 144 143  K 3.7 3.7 3.7 3.6 3.9  CL 109 114* 116* 113* 113*  CO2 27 26 27 25 24   GLUCOSE 162* 147* 148* 149* 121*  BUN 83* 81* 83* 90* 94*  CREATININE 2.98* 2.72* 2.76* 2.85* 2.71*  CALCIUM 11.2* 11.1* 11.2* 10.7* 10.8*  MG  --   --   --  2.2 2.3  PHOS  --   --   --  3.9 4.0    GFR: Estimated Creatinine Clearance: 25.1 mL/min (A) (by C-G formula based on SCr of 2.71 mg/dL (H)). Liver Function Tests: Recent Labs  Lab 06/03/21 0106 06/04/21 0241 06/05/21 0421 06/08/21 0240 06/09/21 0142  AST 31 28 23 21  33  ALT 13 12 11 12 15   ALKPHOS 38 39 31* 31* 31*  BILITOT 0.7 0.9 0.5 0.6 0.5  PROT 7.0 6.8 6.2* 5.4* 5.8*  ALBUMIN 3.1* 3.0* 2.7* 2.4* 2.4*    No results for input(s): LIPASE, AMYLASE in the last 168 hours. No results for input(s): AMMONIA in the last 168 hours. Coagulation Profile: Recent Labs  Lab 06/07/21 0342  INR 1.0    Cardiac Enzymes: No results for input(s): CKTOTAL, CKMB, CKMBINDEX, TROPONINI in the last 168 hours. BNP (last 3 results) Recent Labs    02/03/21 1100  PROBNP 635*    HbA1C: No results for input(s): HGBA1C in the last 72 hours. CBG: Recent Labs  Lab 06/08/21 2030 06/09/21 0008 06/09/21 0426  06/09/21 0753 06/09/21 1119  GLUCAP 149* 143* 128* 121* 123*    Lipid Profile: No results for input(s): CHOL, HDL, LDLCALC, TRIG, CHOLHDL, LDLDIRECT in the last 72 hours. Thyroid Function Tests: No results for input(s): TSH, T4TOTAL, FREET4, T3FREE, THYROIDAB in the last 72 hours. Anemia Panel: No results for input(s): VITAMINB12, FOLATE, FERRITIN, TIBC, IRON, RETICCTPCT in the last 72 hours. Sepsis Labs: Recent Labs  Lab 06/03/21 0106 06/04/21 0241  PROCALCITON 0.17 0.14     Recent Results (from the past 240 hour(s))  Culture, blood (routine x 2)     Status: None   Collection Time: 06/01/21  8:14 AM   Specimen: BLOOD  Result Value Ref Range Status   Specimen Description BLOOD RIGHT ANTECUBITAL  Final   Special Requests   Final    BOTTLES DRAWN AEROBIC AND ANAEROBIC Blood  Culture adequate volume   Culture   Final    NO GROWTH 5 DAYS Performed at [facility], [place] [address] [place], [place] [address]    Report Status 06/06/2021 FINAL  Final  Culture, blood (routine x 2)     Status: None   Collection Time: 06/01/21  8:31 AM   Specimen: BLOOD RIGHT HAND  Result Value Ref Range Status   Specimen Description BLOOD RIGHT HAND  Final   Special Requests   Final    BOTTLES DRAWN AEROBIC AND ANAEROBIC Blood Culture adequate volume   Culture   Final    NO GROWTH 5 DAYS Performed at [facility], [place] [address], [place], [place] [address]    Report Status 06/06/2021 FINAL  Final  Resp Panel by RT-PCR (Flu A&B, Covid) Nasopharyngeal Swab     Status: None   Collection Time: 06/01/21  2:02 PM   Specimen: Nasopharyngeal Swab; Nasopharyngeal(NP) swabs in vial transport medium  Result Value Ref Range Status   SARS Coronavirus 2 by RT PCR NEGATIVE NEGATIVE Final    Comment: (NOTE) SARS-CoV-2 target nucleic acids are NOT DETECTED.  The SARS-CoV-2 RNA is generally detectable in upper respiratory specimens during the acute phase of infection. The lowest concentration  of SARS-CoV-2 viral copies this assay can detect is 138 copies/mL. A negative result does not preclude SARS-Cov-2 infection and should not be used as the sole basis for treatment or other patient management decisions. A negative result may occur with  improper specimen collection/handling, submission of specimen other than nasopharyngeal swab, presence of viral mutation(s) within the areas targeted by this assay, and inadequate number of viral copies(<138 copies/mL). A negative result must be combined with clinical observations, patient history, and epidemiological information. The expected result is Negative.  Fact Sheet for Patients:  EntrepreneurPulse.com.au  Fact Sheet for Healthcare Providers:  IncredibleEmployment.be  This test is no t yet approved or cleared by the [place] FDA and  has been authorized for detection and/or diagnosis of SARS-CoV-2 by FDA under an Emergency Use Authorization (EUA). This EUA will remain  in effect (meaning this test can be used) for the duration of the COVID-19 declaration under Section 564(b)(1) of the Act, 21 U.S.C.section 360bbb-3(b)(1), unless the authorization is terminated  or revoked sooner.       Influenza A by PCR NEGATIVE NEGATIVE Final   Influenza B by PCR NEGATIVE NEGATIVE Final    Comment: (NOTE) The Xpert Xpress SARS-CoV-2/FLU/RSV plus assay is intended as an aid in the diagnosis of influenza from Nasopharyngeal swab specimens and should not be used as a sole basis for treatment. Nasal washings and aspirates are unacceptable for Xpert Xpress SARS-CoV-2/FLU/RSV testing.  Fact Sheet for Patients: EntrepreneurPulse.com.au  Fact Sheet for Healthcare Providers: IncredibleEmployment.be  This test is not yet approved or cleared by the [place] FDA and has been authorized for detection and/or diagnosis of SARS-CoV-2 by FDA under an Emergency Use  Authorization (EUA). This EUA will remain in effect (meaning this test can be used) for the duration of the COVID-19 declaration under Section 564(b)(1) of the Act, 21 U.S.C. section 360bbb-3(b)(1), unless the authorization is terminated or revoked.  Performed at [facility], [place] [address], [place], [place] [address]   Fungus culture, blood     Status: None (Preliminary result)   Collection Time: 06/05/21  1:43 PM   Specimen: BLOOD RIGHT HAND  Result Value Ref Range Status   Specimen Description BLOOD RIGHT HAND  Final   Special Requests   Final    BOTTLES DRAWN AEROBIC ONLY Blood Culture results may not be optimal due to an inadequate volume of blood received in culture bottles   Culture   Final    NO GROWTH 4 DAYS Performed at [facility], [place] [address], [place], [place] [address]    Report Status PENDING  Incomplete  Culture, blood (single)     Status: None (Preliminary result)   Collection Time: 06/06/21  7:33 AM   Specimen: BLOOD  Result Value Ref Range Status   Specimen Description BLOOD RIGHT ANTECUBITAL  Final   Special Requests   Final    BOTTLES DRAWN AEROBIC AND ANAEROBIC Blood Culture adequate volume   Culture   Final    NO GROWTH 3 DAYS Performed at [facility], [place] [address] [place], [place] [address]    Report Status PENDING  Incomplete      RN Pressure Injury Documentation:     Estimated body mass index is 22.78 kg/m as calculated from the following:   Height as of this encounter: 5\' 10"  (1.778 m).   Weight as of this encounter: 72 kg.  Malnutrition Type:  Nutrition Problem: Severe Malnutrition Etiology: chronic illness (Alzheimers dementia, DM, CKD, CHF)   Malnutrition Characteristics:  Signs/Symptoms: moderate fat depletion, severe fat depletion, moderate muscle depletion, severe muscle depletion, percent weight loss Percent weight loss: 23 %   Nutrition Interventions:  Interventions: Tube feeding, MVI, Prostat    Radiology Studies: DG CHEST PORT 1 VIEW  Result Date: 06/09/2021 CLINICAL DATA:  Shortness of breath, history of stroke EXAM: PORTABLE CHEST 1 VIEW COMPARISON:  06/07/2021 FINDINGS: Mild cardiomegaly. Partially imaged enteric feeding tube. Both lungs are clear. The visualized skeletal structures are unremarkable. IMPRESSION: Mild cardiomegaly. No acute abnormality of the lungs in AP portable projection. Electronically Signed   By: Eddie Candle M.D.   On: 06/09/2021 08:51   DG Swallowing Func-Speech Pathology  Result Date: 06/08/2021 Formatting of this result is different from the original. Objective Swallowing Evaluation: Type of Study: MBS-Modified Barium Swallow Study  Patient Details Name: Casey Greene MRN: [id number] Date of Birth: Sep 30, 1951 Today's Date: 06/08/2021 Time: SLP Start Time (ACUTE ONLY): 1422 -SLP Stop Time (ACUTE ONLY): 1435 SLP Time Calculation (min) (ACUTE ONLY): 13 min Past Medical History: Past Medical History: Diagnosis Date  Anemia of chronic disease 03/28/2018  Ascending aorta dilation 07/29/2019  (a) Echo 07/29/19 61mm  Bleeding per rectum 12/09/2018  CKD (chronic kidney disease) stage 3, GFR 30-59 ml/min ([place]) 10/13/2014  Discussed with patient importance of good BP control, avoiding nephrotoxic meds/supplements in preventing further progression  Constipation 12/01/2018  Corn or callus 06/11/2019  Decreased appetite   Diastolic congestive heart failure ([place]) 02/03/2021  DOE (dyspnea on exertion) 06/17/2020  Dysrhythmia   Edema 07/09/2016  Elevated hemoglobin A1c 06/11/2019  Essential hypertension 06/09/2014  Overview:  metoprolol caused bradycardia amlodipine causes headache, leg swelling  Last Assessment & Plan:  We had a long discussion regarding goals of treatment of HTN to prevent worsening cardiac and renal disease.  We also discussed options to change his regimen.  He states that he would prefer to seek a practitioner who can recommend herbal remedies as he feels thhis will be a  healthier path f  Hepatitis C virus infection cured after antiviral drug therapy 07/05/2014  History of gout   History of urinary frequency 12/05/2020  Hyperlipidemia 06/29/2014  Hypokalemia 12/09/2018  Laryngeal mass 06/17/2020  Localized osteoarthritis  of left knee 06/11/2020  Left knee injection-06/10/2020  LVH (left ventricular hypertrophy) due to hypertensive disease   (a) echo 07/29/19 severe concentric LVH  Major depressive disorder 02/27/2016  Mass of thyroid region 03/28/2018  Mild neurocognitive disorder due to multiple etiologies 12/30/2020  Nonsustained ventricular tachycardia 07/23/2019  (a) Zio monitor 06/2019. Subsequent echo with severe concentric LVH, but normal EF 55-60%  Onychomycosis 12/01/2013  PAC (premature atrial contraction)   (a) ZIO 06/2019. Asymptomatic  Prostate cancer 1997  Dx in 1997, never treated, taking herbal supplements. S/p prostatectomy 2017  PVD (peripheral vascular disease) 12/01/2013  Stroke   Per 12/26/20 MRI - remote pontine and bilateral thalamic infarcts   Urinary retention  Past Surgical History: Past Surgical History: Procedure Laterality Date  ACHILLES TENDON REPAIR    CERVICAL DISC SURGERY    neck  COLONOSCOPY    before 2010   LYMPHADENECTOMY Bilateral 01/26/2016  Procedure: EXTENDED BILATERAL PELVIC LYMPHADENECTOMY;  Surgeon: Raynelle Bring, MD;  Location: [facility];  Service: Urology;  Laterality: Bilateral;  NOSE SURGERY    in high school-nasal fracturex2  ROBOT ASSISTED LAPAROSCOPIC RADICAL PROSTATECTOMY N/A 01/26/2016  Procedure: XI ROBOTIC ASSISTED LAPAROSCOPIC RADICAL PROSTATECTOMY LEVEL 3;  Surgeon: Raynelle Bring, MD;  Location: [facility];  Service: Urology;  Laterality: N/A; HPI: 70 yr old admitted with LUE and LLE weakness. CT revealed acute right thalamic hemorrhage. 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), DM, mild neurocognitive disorder d/t multiple etiologies, prostate cancer, pontine/bil thalamic infarcts 12/26/20  Subjective: requires cueing, fluctuating  alertness Assessment / Plan / Recommendation CH[facility] IP CLINICAL IMPRESSIONS 06/08/2021 Clinical Impression Pt presents with a severe oral and moderate pharyngeal dysphagia likely of multifactorial etiology including recent CVA with fluctuating mentation. While pt was able to arouse with cueing, his overall sustained attention was diminished as compared to clinical interaction this am which suspected to contribute to worsened outcome. Pt with poor labial seal with diffuse oral residuals (worst with thicker POs), anterior sulcus pocketing, decreased bolus cohesion, reduced posterior bolus propulsion, and frequent anterior spillage. Pt with mistimed epiglottic deflection, reduced pharyngeal stripping wave, poor base of tongue retraction, and diminished laryngeal sensation. This allowed for intermittent during the swallow laryngeal penetration of nectar thick liquids and post swallow spillage of residuals with all consistencies (thin, nectar, honey thick, and puree; (PAS- 3,5). Pt not able to consistently implement throat clear, cough, or volitional swallow to aid airway protection. While no appreciable aspiration exhibited during exam, pt very high risk for aspiration across a meal. Per MD report, pts family not ready for comfort diet with known aspiration risk. Pt to remain NPO with continued ST intervention and will likely pursue PEG. SLP to continue to follow. SLP Visit Diagnosis Dysphagia, oropharyngeal phase (R13.12) Attention and concentration deficit following -- Frontal lobe and executive function deficit following -- Impact on safety and function Moderate aspiration risk;Severe aspiration risk   CHL IP TREATMENT RECOMMENDATION 06/08/2021 Treatment Recommendations Therapy as outlined in treatment plan below   Prognosis 06/08/2021 Prognosis for Safe Diet Advancement Good Barriers to Reach Goals Time post onset;Cognitive deficits Barriers/Prognosis Comment -- CHL IP DIET RECOMMENDATION 06/08/2021 SLP Diet  Recommendations NPO Liquid Administration via -- Medication Administration Via alternative means Compensations -- Postural Changes --   CHL IP OTHER RECOMMENDATIONS 06/08/2021 Recommended Consults -- Oral Care Recommendations Oral care QID Other Recommendations --   CHL IP FOLLOW UP RECOMMENDATIONS 06/08/2021 Follow up Recommendations Inpatient Rehab;24 hour supervision/assistance   CHL IP FREQUENCY AND DURATION 06/08/2021 Speech Therapy Frequency (  ACUTE ONLY) min 2x/week Treatment Duration 2 weeks      CHL IP ORAL PHASE 06/08/2021 Oral Phase Impaired Oral - Pudding Teaspoon -- Oral - Pudding Cup -- Oral - Honey Teaspoon Left anterior bolus loss;Right anterior bolus loss;Impaired mastication;Weak lingual manipulation;Incomplete tongue to palate contact;Reduced posterior propulsion;Holding of bolus;Pocketing in anterior sulcus;Lingual/palatal residue;Delayed oral transit;Decreased bolus cohesion Oral - Honey Cup Left anterior bolus loss;Right anterior bolus loss;Impaired mastication;Weak lingual manipulation;Incomplete tongue to palate contact;Reduced posterior propulsion;Holding of bolus;Pocketing in anterior sulcus;Lingual/palatal residue;Delayed oral transit;Decreased bolus cohesion Oral - Nectar Teaspoon -- Oral - Nectar Cup Left anterior bolus loss;Right anterior bolus loss;Weak lingual manipulation;Reduced posterior propulsion;Holding of bolus;Pocketing in anterior sulcus;Lingual/palatal residue;Delayed oral transit;Decreased bolus cohesion Oral - Nectar Straw -- Oral - Thin Teaspoon -- Oral - Thin Cup Delayed oral transit;Left anterior bolus loss;Right anterior bolus loss;Weak lingual manipulation;Incomplete tongue to palate contact;Reduced posterior propulsion;Holding of bolus;Pocketing in anterior sulcus;Lingual/palatal residue Oral - Thin Straw -- Oral - Puree Delayed oral transit;Decreased bolus cohesion;Weak lingual manipulation;Lingual pumping;Reduced posterior propulsion;Holding of bolus;Lingual/palatal  residue;Pocketing in anterior sulcus Oral - Mech Soft -- Oral - Regular -- Oral - Multi-Consistency -- Oral - Pill -- Oral Phase - Comment --  CHL IP PHARYNGEAL PHASE 06/08/2021 Pharyngeal Phase Impaired Pharyngeal- Pudding Teaspoon -- Pharyngeal -- Pharyngeal- Pudding Cup -- Pharyngeal -- Pharyngeal- Honey Teaspoon Delayed swallow initiation-vallecula;Reduced tongue base retraction;Pharyngeal residue - valleculae Pharyngeal -- Pharyngeal- Honey Cup Delayed swallow initiation-vallecula;Reduced pharyngeal peristalsis;Reduced epiglottic inversion;Reduced tongue base retraction;Penetration/Apiration after swallow;Pharyngeal residue - pyriform;Pharyngeal residue - valleculae;Reduced laryngeal elevation Pharyngeal Material enters airway, remains ABOVE vocal cords and not ejected out;Material enters airway, CONTACTS cords and not ejected out Pharyngeal- Nectar Teaspoon -- Pharyngeal -- Pharyngeal- Nectar Cup Penetration/Aspiration during swallow;Reduced laryngeal elevation;Reduced airway/laryngeal closure;Reduced tongue base retraction;Reduced pharyngeal peristalsis;Reduced epiglottic inversion;Reduced anterior laryngeal mobility;Pharyngeal residue - valleculae;Pharyngeal residue - pyriform Pharyngeal Material enters airway, remains ABOVE vocal cords and not ejected out;Material enters airway, CONTACTS cords and not ejected out Pharyngeal- Nectar Straw -- Pharyngeal -- Pharyngeal- Thin Teaspoon -- Pharyngeal -- Pharyngeal- Thin Cup Reduced epiglottic inversion;Reduced pharyngeal peristalsis;Reduced anterior laryngeal mobility;Reduced laryngeal elevation;Reduced airway/laryngeal closure;Reduced tongue base retraction;Penetration/Apiration after swallow;Pharyngeal residue - valleculae;Pharyngeal residue - pyriform Pharyngeal Material enters airway, remains ABOVE vocal cords and not ejected out;Material enters airway, CONTACTS cords and not ejected out Pharyngeal- Thin Straw -- Pharyngeal -- Pharyngeal- Puree Delayed swallow  initiation-vallecula;Reduced pharyngeal peristalsis;Reduced epiglottic inversion;Reduced anterior laryngeal mobility;Reduced laryngeal elevation;Reduced tongue base retraction;Penetration/Apiration after swallow;Pharyngeal residue - valleculae;Pharyngeal residue - pyriform Pharyngeal Material enters airway, remains ABOVE vocal cords and not ejected out;Material enters airway, CONTACTS cords and not ejected out Pharyngeal- Mechanical Soft -- Pharyngeal -- Pharyngeal- Regular -- Pharyngeal -- Pharyngeal- Multi-consistency -- Pharyngeal -- Pharyngeal- Pill -- Pharyngeal -- Pharyngeal Comment --  CHL IP CERVICAL ESOPHAGEAL PHASE 06/08/2021 Cervical Esophageal Phase WFL Pudding Teaspoon -- Pudding Cup -- Honey Teaspoon -- Honey Cup -- Nectar Teaspoon -- Nectar Cup -- Nectar Straw -- Thin Teaspoon -- Thin Cup -- Thin Straw -- Puree -- Mechanical Soft -- Regular -- Multi-consistency -- Pill -- Cervical Esophageal Comment -- Chelsea E Hartness MA, CCC-SLP 06/08/2021, 3:18 PM               Scheduled Meds:  carvedilol  25 mg Per NG tube BID WC   chlorhexidine  15 mL Mouth Rinse BID   Chlorhexidine Gluconate Cloth  6 each Topical Q0600   feeding supplement (PROSource TF)  45 mL Per Tube BID   free water  400 mL Per Tube Q6H  heparin injection (subcutaneous)  5,000 Units Subcutaneous Q8H   hydrALAZINE  100 mg Per Tube TID   lidocaine       mouth rinse  15 mL Mouth Rinse q12n4p   multivitamin with minerals  1 tablet Per Tube Daily   pantoprazole sodium  40 mg Per Tube QHS   senna-docusate  1 tablet Per Tube BID   sodium chloride flush  3 mL Intravenous Once   Continuous Infusions:   ceFAZolin (ANCEF) IV     dextrose 50 mL/hr at 06/07/21 0830   feeding supplement (OSMOLITE 1.5 CAL) Stopped (06/09/21 0022)    LOS: 12 days   Kerney Elbe, DO [facility] PAGER is on [place]  If 7PM-7AM, please contact night-coverage [email or web address]

## 2021-06-09 NOTE — NC FL2 (Signed)
[place] LEVEL OF CARE SCREENING TOOL     IDENTIFICATION  Patient Name: Casey Greene Birthdate: 18-Jan-1951 Sex: male Admission Date (Current Location): 05/28/2021  [facility] and [place] Number:  Herbalist and Address:  [facility], [place] [address], [place], [place] [address]      Provider Number: [id number]  Attending Physician Name and Address:  Kerney Elbe, DO  Relative Name and Phone Number:       Current Level of Care: Hospital Recommended Level of Care: [place] Prior Approval Number:    Date Approved/Denied:   PASRR Number:    Discharge Plan: SNF    Current Diagnoses: Patient Active Problem List   Diagnosis Date Noted   Protein-calorie malnutrition, severe 05/30/2021   ICH (intracerebral hemorrhage) ([place]) 05/28/2021   Radiation proctitis 05/19/2021   AKI (acute kidney injury) ([place]) 05/06/2021   Acute cystitis 05/06/2021   Acute encephalopathy 05/05/2021   Ceruminosis, left 02/10/2021   Chronic diastolic CHF (congestive heart failure) ([place]) 02/03/2021   Urinary retention    Mild neurocognitive disorder due to multiple etiologies 12/30/2020   PAC (premature atrial contraction)    Stroke    History of urinary frequency 12/05/2020   History of gout    Dysrhythmia    Decreased appetite    Laryngeal mass 06/17/2020   DOE (dyspnea on exertion) 06/17/2020   Localized osteoarthritis of left knee 06/11/2020   LVH (left ventricular hypertrophy) due to hypertensive disease    Ascending aorta dilation 07/29/2019   Nonsustained ventricular tachycardia 07/23/2019   Elevated hemoglobin A1c 06/11/2019   Corn or callus 06/11/2019   Hypokalemia 12/09/2018   Constipation 12/01/2018   Mass of thyroid region 03/28/2018   Anemia of chronic disease 03/28/2018   Edema 07/09/2016   Major depressive disorder 02/27/2016   CKD (chronic kidney disease) stage 3, GFR 30-59 ml/min 10/13/2014   Hepatitis C virus  infection cured after antiviral drug therapy 07/05/2014   Hyperlipidemia 06/29/2014   Prostate cancer 06/09/2014   Essential hypertension 06/09/2014   Onychomycosis 12/01/2013   PVD (peripheral vascular disease) ([place]) 12/01/2013    Orientation RESPIRATION BLADDER Height & Weight     Self  Normal Incontinent Weight: 158 lb 11.7 oz (72 kg) Height:  5\' 10"  (177.8 cm)  BEHAVIORAL SYMPTOMS/MOOD NEUROLOGICAL BOWEL NUTRITION STATUS      Incontinent Feeding tube (Osmolite 1.5)  AMBULATORY STATUS COMMUNICATION OF NEEDS Skin   Extensive Assist Non-Verbally Normal                       Personal Care Assistance Level of Assistance  Bathing, Feeding, Dressing Bathing Assistance: Maximum assistance Feeding assistance: Maximum assistance Dressing Assistance: Maximum assistance     Functional Limitations Info  Speech     Speech Info: Impaired (expressive and receptive aphasia)    SPECIAL CARE FACTORS FREQUENCY  PT (By licensed PT), OT (By licensed OT), Speech therapy     PT Frequency: 5x/wk OT Frequency: 5x/wk     Speech Therapy Frequency: 5x/wk      Contractures Contractures Info: Not present    Additional Factors Info  Code Status, Allergies Code Status Info: DNR Allergies Info: Amlodipine, Tamsulosin Hcl           Current Medications (06/09/2021):  This is the current hospital active medication list Current Facility-Administered Medications  Medication Dose Route Frequency Provider Last Rate Last Admin   acetaminophen (TYLENOL) 160 MG/5ML solution 650 mg  650 mg Per Tube Q6H PRN Jonetta Osgood, MD   650 mg at 06/08/21 2313   carvedilol (COREG) tablet 25 mg  25 mg Per NG tube BID WC Ghimire, Henreitta Leber, MD   25 mg at 06/08/21 1532   ceFAZolin (ANCEF) IVPB 2g/100 mL premix  2 g Intravenous On Call Docia Barrier, PA       chlorhexidine (PERIDEX) 0.12 % solution 15 mL  15 mL Mouth Rinse BID Vonzella Nipple, NP   15 mL at 06/08/21 2240   Chlorhexidine  Gluconate Cloth 2 % PADS 6 each  6 each Topical Q0600 Vonzella Nipple, NP   6 each at 06/09/21 0643   dextrose 5 % solution   Intravenous Continuous Raiford Noble [place], DO 50 mL/hr at 06/07/21 0830 New Bag at 06/07/21 0830   feeding supplement (OSMOLITE 1.5 CAL) liquid 1,000 mL  1,000 mL Per Tube Continuous Vonzella Nipple, NP   Stopped at 06/09/21 0022   feeding supplement (PROSource TF) liquid 45 mL  45 mL Per Tube BID Vonzella Nipple, NP   45 mL at 06/08/21 2240   free water 400 mL  400 mL Per Tube Q6H Jonetta Osgood, MD   400 mL at 06/08/21 2316   heparin injection 5,000 Units  5,000 Units Subcutaneous Q8H Rosalin Hawking, MD   5,000 Units at 06/09/21 5170   hydrALAZINE (APRESOLINE) injection 20 mg  20 mg Intravenous Q6H PRN Vonzella Nipple, NP   20 mg at 06/06/21 0174   hydrALAZINE (APRESOLINE) tablet 100 mg  100 mg Per Tube TID Vonzella Nipple, NP   100 mg at 06/08/21 2240   labetalol (NORMODYNE) injection 20 mg  20 mg Intravenous Q2H PRN Vonzella Nipple, NP   20 mg at 06/09/21 0803   levalbuterol (XOPENEX) nebulizer solution 0.63 mg  0.63 mg Nebulization Q6H PRN Raiford Noble [place], DO       MEDLINE mouth rinse  15 mL Mouth Rinse q12n4p Vonzella Nipple, NP   15 mL at 06/08/21 1533   multivitamin with minerals tablet 1 tablet  1 tablet Per Tube Daily Vonzella Nipple, NP   1 tablet at 06/08/21 0802   pantoprazole sodium (PROTONIX) 40 mg/20 mL oral suspension 40 mg  40 mg Per Tube QHS Skeet Simmer, RPH   40 mg at 06/08/21 2240   senna-docusate (Senokot-S) tablet 1 tablet  1 tablet Per Tube BID Vonzella Nipple, NP   1 tablet at 06/08/21 2241   sodium chloride flush (NS) 0.9 % injection 3 mL  3 mL Intravenous Once Vonzella Nipple, NP         Discharge Medications: Please see discharge summary for a list of discharge medications.  Relevant Imaging Results:  Relevant Lab Results:   Additional Information SS#: [place],  [place]

## 2021-06-10 DIAGNOSIS — G9341 Metabolic encephalopathy: Secondary | ICD-10-CM | POA: Diagnosis not present

## 2021-06-10 DIAGNOSIS — T17908A Unspecified foreign body in respiratory tract, part unspecified causing other injury, initial encounter: Secondary | ICD-10-CM | POA: Diagnosis not present

## 2021-06-10 DIAGNOSIS — I610 Nontraumatic intracerebral hemorrhage in hemisphere, subcortical: Secondary | ICD-10-CM | POA: Diagnosis not present

## 2021-06-10 DIAGNOSIS — E43 Unspecified severe protein-calorie malnutrition: Secondary | ICD-10-CM | POA: Diagnosis not present

## 2021-06-10 DIAGNOSIS — Z515 Encounter for palliative care: Secondary | ICD-10-CM | POA: Diagnosis not present

## 2021-06-10 LAB — PHOSPHORUS: Phosphorus: 4.9 mg/dL — ABNORMAL HIGH (ref 2.5–4.6)

## 2021-06-10 LAB — CBC WITH DIFFERENTIAL/PLATELET
Abs Immature Granulocytes: 0.04 10*3/uL (ref 0.00–0.07)
Basophils Absolute: 0 10*3/uL (ref 0.0–0.1)
Basophils Relative: 0 %
Eosinophils Absolute: 0 10*3/uL (ref 0.0–0.5)
Eosinophils Relative: 0 %
HCT: 23.1 % — ABNORMAL LOW (ref 39.0–52.0)
Hemoglobin: 7.3 g/dL — ABNORMAL LOW (ref 13.0–17.0)
Immature Granulocytes: 0 %
Lymphocytes Relative: 3 %
Lymphs Abs: 0.3 10*3/uL — ABNORMAL LOW (ref 0.7–4.0)
MCH: 31.3 pg (ref 26.0–34.0)
MCHC: 31.6 g/dL (ref 30.0–36.0)
MCV: 99.1 fL (ref 80.0–100.0)
Monocytes Absolute: 0.4 10*3/uL (ref 0.1–1.0)
Monocytes Relative: 4 %
Neutro Abs: 8.7 10*3/uL — ABNORMAL HIGH (ref 1.7–7.7)
Neutrophils Relative %: 93 %
Platelets: 220 10*3/uL (ref 150–400)
RBC: 2.33 MIL/uL — ABNORMAL LOW (ref 4.22–5.81)
RDW: 13.4 % (ref 11.5–15.5)
WBC: 9.4 10*3/uL (ref 4.0–10.5)
nRBC: 0 % (ref 0.0–0.2)

## 2021-06-10 LAB — COMPREHENSIVE METABOLIC PANEL
ALT: 14 U/L (ref 0–44)
AST: 36 U/L (ref 15–41)
Albumin: 2.4 g/dL — ABNORMAL LOW (ref 3.5–5.0)
Alkaline Phosphatase: 34 U/L — ABNORMAL LOW (ref 38–126)
Anion gap: 7 (ref 5–15)
BUN: 87 mg/dL — ABNORMAL HIGH (ref 8–23)
CO2: 24 mmol/L (ref 22–32)
Calcium: 11 mg/dL — ABNORMAL HIGH (ref 8.9–10.3)
Chloride: 116 mmol/L — ABNORMAL HIGH (ref 98–111)
Creatinine, Ser: 2.58 mg/dL — ABNORMAL HIGH (ref 0.61–1.24)
GFR, Estimated: 26 mL/min — ABNORMAL LOW (ref >60)
Glucose, Bld: 125 mg/dL — ABNORMAL HIGH (ref 70–99)
Potassium: 3.7 mmol/L (ref 3.5–5.1)
Sodium: 147 mmol/L — ABNORMAL HIGH (ref 135–145)
Total Bilirubin: 0.6 mg/dL (ref 0.3–1.2)
Total Protein: 5.8 g/dL — ABNORMAL LOW (ref 6.5–8.1)

## 2021-06-10 LAB — RETICULOCYTES
Immature Retic Fract: 13.9 % (ref 2.3–15.9)
RBC.: 2.66 MIL/uL — ABNORMAL LOW (ref 4.22–5.81)
Retic Count, Absolute: 68.4 10*3/uL (ref 19.0–186.0)
Retic Ct Pct: 2.6 % (ref 0.4–3.1)

## 2021-06-10 LAB — GLUCOSE, CAPILLARY
Glucose-Capillary: 117 mg/dL — ABNORMAL HIGH (ref 70–99)
Glucose-Capillary: 121 mg/dL — ABNORMAL HIGH (ref 70–99)
Glucose-Capillary: 123 mg/dL — ABNORMAL HIGH (ref 70–99)
Glucose-Capillary: 125 mg/dL — ABNORMAL HIGH (ref 70–99)
Glucose-Capillary: 127 mg/dL — ABNORMAL HIGH (ref 70–99)
Glucose-Capillary: 133 mg/dL — ABNORMAL HIGH (ref 70–99)

## 2021-06-10 LAB — MAGNESIUM: Magnesium: 2.5 mg/dL — ABNORMAL HIGH (ref 1.7–2.4)

## 2021-06-10 LAB — IRON AND TIBC
Iron: 29 ug/dL — ABNORMAL LOW (ref 45–182)
Saturation Ratios: 12 % — ABNORMAL LOW (ref 17.9–39.5)
TIBC: 235 ug/dL — ABNORMAL LOW (ref 250–450)
UIBC: 206 ug/dL

## 2021-06-10 LAB — FOLATE: Folate: 12.9 ng/mL (ref >5.9)

## 2021-06-10 LAB — VITAMIN B12: Vitamin B-12: 697 pg/mL (ref 180–914)

## 2021-06-10 LAB — FERRITIN: Ferritin: 198 ng/mL (ref 24–336)

## 2021-06-10 MED ORDER — BACITRACIN-NEOMYCIN-POLYMYXIN OINTMENT TUBE
1.0000 "application " | TOPICAL_OINTMENT | Freq: Every day | CUTANEOUS | Status: AC
  Administered 2021-06-10 – 2021-06-16 (×7): 1 via TOPICAL
  Filled 2021-06-10 (×2): qty 14

## 2021-06-10 MED ORDER — PHENOL 1.4 % MT LIQD: 1.0000 | OROMUCOSAL | Status: DC | PRN

## 2021-06-10 NOTE — TOC Progression Note (Signed)
Transition of Care [facility]) - Progression Note    Patient Details  Name: Casey Greene MRN: [id number] Date of Birth: 1951/04/05  Transition of Care [facility]) CM/SW [place], [place] Phone Number: 06/10/2021, 2:14 PM  Clinical Narrative:    CSW gave bed offers to pt's wife at bedside. All questions about the process were answered. Spouse asked that CSW follow up with [facility] and [facility] to see if they can offer a bed, VM was left. Pt still with a cortrak at this time. SW will continue to follow for DC needs.   Expected Discharge Plan: [place] Barriers to Discharge: Continued Medical Work up, Ship broker  Expected Discharge Plan and Services Expected Discharge Plan: [place] Choice: [place] Living arrangements for the past 2 months: Apartment                                       Social Determinants of Health (SDOH) Interventions    Readmission Risk Interventions Readmission Risk Prevention Plan 10/09/2020  Medication Screening Complete  Transportation Screening Complete  Some recent data might be hidden

## 2021-06-10 NOTE — Progress Notes (Signed)
Daily Progress Note   Patient Name: Casey Greene       Date: 06/10/2021 DOB: [birth year]  Age: 70 y.o. MRN#: [id number] Attending Physician: Kerney Elbe, DO Primary Care Physician: [name], MD Admit Date: 05/28/2021  Reason for Consultation/Follow-up:  To discuss complex medical decision making related to patient's goals of care  PEG placed yesterday.  Not yet being used.  Subjective: Casey Greene is very pleasant (as always) and remembers me from last week.  Patient is very lethargic and interacts at a minimal level with me.  His cough sounds very congested.  PEG site bandage clean and dry.  PEG tube feeds not yet started.  Patients hands are very swollen.  Talked with Casey Greene and asked her about her expectations over the next couple of months.  She said "I don't know.  I don't know if it's going to get any better than this."   She mentioned that Casey Greene told her he does not want to be connected to machines.  She is relieved to have that guidance.    I pointed out that Casey Greene looks like this (lethargic, secretions, weak) here.  The level of care in SNF is not going to be what it is here.   She understands.  She expresses that if he suffers another aspiration pna she will not put him on the ventilator.   For now she feels she has to push forward.  "He's trying to speak to me".  Indeed, Casey Greene says "Casey Greene" which Casey Greene understands means to call his friend Casey Greene.   Assessment: Lethargic, wet, congested, weak   Patient Profile/HPI:   70 y.o. male with past medical history of multiple CVAs, DM, HFpEF (G2DD), PVD, prostate CA, CKD 3, MDD, mild neurocognitive disorder, and severe malnutrition, who was admitted on 05/28/2021 with left sided weakness.  He was found to have an intercerebral  hemorrage in the right thalamic area with intraventricular extension and hydrocephalus.  Neurology recommendations have been made.  He is on aspirin and plavix and being considered for CIR.  A cor trak was placed for nutrition.  On 7/7 the patient developed a fever. Blood cultures are still pending but his very wet vocal quality and thick secretions give rise to suspicion for aspiration pneumonia.   I have spoken directly with the Neurologist who feels  that he may regain a limited amount of lost functionality on the left, but it will take 2-3 months before we will really know whether or not he will regain the ability to swallow.   Of course this would be significantly hampered by recurrent aspiration pneumonia.    Length of Stay: 13   Vital Signs: BP 134/85   Pulse 81   Temp 98.8 F (37.1 C) (Oral)   Resp 17   Ht 5\' 10"  (1.778 m)   Wt 71.6 kg   SpO2 100%   BMI 22.65 kg/m  SpO2: SpO2: 100 % O2 Device: O2 Device: Room Air O2 Flow Rate: O2 Flow Rate (L/min): 2 L/min       Palliative Assessment/Data: 20%     Palliative Care Plan    Recommendations/Plan: Continue current care. PEG feedings to start  Will DC to SNF in the next day or two. High risk for re-admission  Code Status:  DNR  Prognosis:  < 3 months   Discharge Planning: Casey Greene for rehab with Palliative care service follow-up  Care plan was discussed with wife  Thank you for allowing the Palliative Medicine Team to assist in the care of this patient.  Total time spent:  35 min.     Greater than 50%  of this time was spent counseling and coordinating care related to the above assessment and plan.  Florentina Jenny, [name] Palliative Medicine  Please contact Palliative MedicineTeam phone at [phone] for questions and concerns between 7 am - 7 pm.   Please see AMION for individual provider pager numbers.

## 2021-06-10 NOTE — Progress Notes (Signed)
Patient ID: Casey Greene, male   DOB: 1950-11-30, 70 y.o.   MRN: [id number] Pt s/p perc G tube placement yesterday; afebrile; WBC nl; hgb 7.3(7.5), creat 2.58(2.71); G tube intact, insertion site ok, mildly tender, no leaking noted, abd soft; ok to use tube as needed; keep outer disc taut to skin surface to minimize leakage; may place 1 layer of split gauze under disc to prevent skin irritation.

## 2021-06-10 NOTE — Progress Notes (Signed)
PROGRESS NOTE    Casey Greene  FYB:[id number] DOB: 11/03/1951 DOA: 05/28/2021 PCP: [name], MD   Brief Narrative:  The patient is a 70 year old African-American male with a past medical history significant for but not limited to history of CVA, mild neurocognitive dysfunction, PAD, chronic diastolic CHF, diabetes mellitus type 2, hypertension, chronic kidney disease stage IV as well as other comorbidities who presented with left-sided hemiplegia and found to have an acute right thalamic hemorrhage.  He is admitted by neurology to the neurointensive care unit and stabilized and then subsequently transferred to the hospitalist service.  Upon 7 7 he became febrile and there was suspicion for microaspiration so he started on IV Unasyn.  Given his lack of improvement and his family's wishes for aggressive care interventional radiology was consulted for possible PEG tube placement.  Palliative care goals of care happening at this time and his mental status is not improving.  He is currently on aspirin and Plavix being given.  Throughout core track tube feeding.  Given his suspicion for aspiration pneumonia and his decreased mental status with ongoing copious secretions he was initiated on breathing treatments and ID was consulted.  He has a very poor prognosis and palliative care discussing with family about possible PEG tube placement.  Continuing goals of care discussion at this time.  Patient's current significant events and significant studies are as follows: 7/3>> presented with left-sided hemiplegia-right thalamic hemorrhage on CT-admit to neuro ICU 7/7>> transfer to [facility] 7/7>> febrile-suspicion for microaspiration-Unasyn started   Significant studies: 6/12>> TTE: EF 52-77%, grade 2 diastolic dysfunction. 7/3>> acute right thalamic hemorrhage with intraventricular extension and hydrocephalus. 7/3>> CT head: Unchanged size of intraparenchymal hemorrhage 7/4>> MRI brain: 2 small acute infarct  in rt parietal lobe, acute right thalamic hemorrhage with intraventricular extension. 7/4>> MRA brain: No significant stenosis or aneurysm 7/4>> MRA neck: No significant stenosis 7/4>> LDL: 50 7/4>> A1c: 5.1 7/7>> CXR: No pneumonia 7/8>> CXR: No pneumonia 7/9>> CT chest: Some scattered secretions in the central trachea/right mainstem bronchus-lungs are clear. 7/9>> CT abdomen: Diffusely fluid-filled appearance of the colon with lack of formed stools, complex multilobulated ventral hernia.  7/12>> CT head: Decreased right thalamic/intraventricular hemorrhage since last week 7/12>> CXR: No pneumonia 06/06/2021 patient had an EEG done which was suggestive of moderate diffuse encephalopathy with nonspecific etiology but there is no seizures or epileptiform discharges seen throughout the recording 06/07/21 patient remains lethargic 06/09/21 Mental Status is improving a little but still lethargic and PEG Tube placed 06/10/21 TF started through PEG   Assessment & Plan:   Active Problems:   ICH (intracerebral hemorrhage) (HCC)   Protein-calorie malnutrition, severe  Right thalamic intracranial hemorrhage with intraventricular extension and hydrocephalus: -Neurological deficits remain unchanged-left-sided hemiplegia/dysphagia persists -Neurology recommending to keep SBP less than 160-bit more lethargic today (repeat CT head stable/ABG without hypercarbia).   -Continue supportive care-continues to have dysphagia and accumulates secretion-family desires full scope of treatment (see palliative care note)-have consulted IR for PEG tube placement but after goals of care discussion the family with palliative they are currently-hold to reevaluate and assess -Will conider resuming Robinul for secretions -Continues to be hard to arouse (CThead/ABG done earlier this am)-coughing-grimaces to pain-per RN opens eyes at time. Dr. Sonia Side Spoke with Stroke MD-Dr Erlinda Hong who evaluated and recommended MRI Brain and EEG. -MRI  Brain showed "Slightly decreased size of right thalamic intraparenchymal hematoma with  intraventricular extension. Numerous chronic microhemorrhages in a predominantly central distribution, consistent with chronic hypertensive angiopathy. Chronic  ischemic microangiopathy" -EEG showed "This study is suggestive of moderate diffuse encephalopathy, nonspecific etiology. No seizures or epileptiform discharges were seen throughout the recording."  -Given issues with lethargy-stop both clonidine and Robinul (was already lethargic this am-when these agents were started)   Dysphagia:  -Continues to have significant dysphagia-have consulted IR for PEG tube placement  and Palliative discussing with family and family weighing risks vs. Benefits of PEG Placement  -Family wants PEG and all other life-prolonging measures per palliative discussion but patient is now DNR/DNI -PEG Placed 06/09/21 and TF started    Fever-suspicion for microaspiration:  Acute respiratory failure with hypoxia -High suspicion for microaspiration-accumulating secretions-although no parenchymal disease seen on imaging studies-has secretions/mucus in his airways (seen on CT chest).   -Doubt UTI-UA benign-even though he has some bilateral perinephric standing on CT.   -Suspect that this is either microaspiration-or central fever.  Appreciate ID input-recommendations are for total of 7 days of Unasyn.  IV Unasyn now stopped  -We will add Xopenex and Atrovent -ID recommending follow up on repeat Blood Cx on 06/06/21 and following Lower extremity Duplex -Aspiration Precautions   Episode of hematochezia on 7/9:  -CBC stable-none since 7/9  -plan is to watch closely for recurrence.   -Hemoglobin relatively stable last few days however dropped to the Low 7's -Check Anemia Panel today -Patient's hemoglobin/hematocrit has been relatively stable and gone from 8.5/26.9 -> 9.1/28.5 -> 8.9/27.8 -> 7.3/22.8 -> 7.5/23.5 -> 7.3/23.1; if necessary will  transfuse 1 unit PRBCs -Continue to Monitor for S/Sx of Bleeding -Repeat CBC in the AM  HyperNatremia/Hyperchloremia -Improving Na+ is now 147 and now Chloride Level is 116; C/w Free Water Flushes -Continued IV fluid hydration from normal saline to D5W.  Now sodium level is now 147 and will increase Rate of D5W to 75 mL/hr -Continue monitor and trend and repeat CMP in a.m.   HTN: -BP creeping up-goal SBP less than 160-added clonidine but has been held as above -continue Coreg/hydralazine.  Losartan on hold due to fluctuating renal function. -Continue to Monitor BP per Protocol -Last BP was 134/85   HLD -On statins prior to this hospital stay-resume when oral intake established.   HFpEF -Euvolemic on exam -Strict I's and O's and Daily Weights -Continue to Monitor Volume Status given that he is on IVF; Patient is -9.976 Liters    AKI CKD stage IV Hyperphosphatemia  -AKI improving slowly after starting normal saline but will change to D5W.   -Continue to hold losartan.   -Phos Level was 4.9 -BUN/Cr went from 83/2.98 -> 81/2.72 -> 83/2.76 -> 90/2.85 -> 94/2.71 -> 87/2.58 -No signs of urinary retention-check frequent bladder scans.   History of mild neurocognitive dysfunction -Resume Aricept when able.   Normocytic anemia:  -Due to CKD-no indication for transfusion at this time -follow CBC daily -Hemoglobin relatively stable last few days however dropped to the Low 7's -Check Anemia Panel today -Patient's hemoglobin/hematocrit has been relatively stable and gone from 8.5/26.9 -> 9.1/28.5 -> 8.9/27.8 -> 7.3/22.8 -> 7.5/23.5 -> 7.3/23.1; if necessary will transfuse 1 unit PRBCs -Repeat CBC in the AM  Palliative care: Currently now DNR/DNI -Appreciate palliative care input-patient/family desires full scope of treatment and PEG and all life prolonging measures. Dr. Sloan Leiter has had repeated discussions with spouse-she is aware of the issue with aspiration pneumonia-and placing a PEG  tube will not minimize risk of aspiration.   -Given ongoing lethargy-aspiration issues/accumulation of secretions-with lack of any significant improvement-he remains with poor prognosis-and at risk  for further decompensation.  He Spoke at length with spouse on 7/12 and relayed concerns againhave implored her to reexamine goals of care -Palliative Care reconsulted for [place] and continuing current Plan of Care and recommending starting with PEG feedings and recommending discharging to SNF in next day or 2 pending bed availability   Severe malnutrition in the context of chronic illness -Evidenced by moderate fat depletion, severe fat depletion and 23% weight loss -Nutritionist consulted and currently on tube feedings with a multivitamin and prostat -Estimated body mass index is 22.71 kg/m as calculated from the following:   Height as of this encounter: 5\' 10"  (1.778 m).   Weight as of this encounter: 71.8 kg. -SLP evaluated and did MBS but patient failed miserably so will need PEG tube placement at this time given that he has oral deficits and moderate pharyngeal deficits. -We will pursue PEG tube placement and IR consulted and likely can be done today given that his Fever Curve improved over the last 24-48 hours  -PEG Placed   DVT prophylaxis: Heparin 5,000 sq q8h Code Status: FULL CODE  Family Communication: No family present at bedside  Disposition Plan: Pending further [place] Discussion and clinical improvement; will get a PEG tube likely tomorrow  Status is: Inpatient  Remains inpatient appropriate because:Unsafe d/c plan, IV treatments appropriate due to intensity of illness or inability to take PO, and Inpatient level of care appropriate due to severity of illness  Dispo: The patient is from: Home              Anticipated d/c is to:  TBD              Patient currently is not medically stable to d/c.   Difficult to place patient No  Consultants:  Palliative Care Neurology  PCCM    Procedures: As above  Antimicrobials:  Anti-infectives (From admission, onward)    Start     Dose/Rate Route Frequency Ordered Stop   06/09/21 1600  ceFAZolin (ANCEF) IVPB 2g/100 mL premix        2 g 200 mL/hr over 30 Minutes Intravenous STAT 06/09/21 1500 06/09/21 1556   06/09/21 1545  ceFAZolin (ANCEF) IVPB 1 g/50 mL premix  Status:  Discontinued        1 g 100 mL/hr over 30 Minutes Intravenous STAT 06/09/21 1457 06/09/21 1500   06/09/21 0900  ceFAZolin (ANCEF) IVPB 2g/100 mL premix        2 g 200 mL/hr over 30 Minutes Intravenous On call 06/09/21 0814 06/10/21 0900   06/01/21 1500  Ampicillin-Sulbactam (UNASYN) 3 g in sodium chloride 0.9 % 100 mL IVPB        3 g 200 mL/hr over 30 Minutes Intravenous Every 12 hours 06/01/21 1402 06/08/21 0405       Subjective: Seen and examined at bedside and is a little more lethargic again today and not as interactive as yesterday.  Denies any complaints of pain.  PEG was placed yesterday.  We we will start tube feedings.  No chest pain or shortness of breath.  No other concerns or complaints this time.  Objective: Vitals:   06/09/21 2340 06/10/21 0336 06/10/21 0410 06/10/21 0500  BP: 130/80 137/76  134/85  Pulse: 89 83  81  Resp: 16 19  17   Temp: 98.9 F (37.2 C) 98.8 F (37.1 C)    TempSrc: Oral Oral    SpO2: 97% 99%  100%  Weight:   71.6 kg   Height:  Intake/Output Summary (Last 24 hours) at 06/10/2021 1407 Last data filed at 06/10/2021 0400 Gross per 24 hour  Intake 1072.72 ml  Output 1300 ml  Net -227.28 ml    Filed Weights   06/07/21 0635 06/08/21 0500 06/10/21 0410  Weight: 71.8 kg 72 kg 71.6 kg   Examination: Physical Exam:  Constitutional: Chronically ill-appearing elderly African-American male currently in no acute distress but is still very lethargic and not as responsive as he is yesterday.  Currently denies any pain. Eyes: Eyes are closed and lids appear normal. ENMT: External Ears, Nose appear normal.  Grossly normal hearing.  Small bore feeding tube in his nose Neck: Appears normal, supple, no cervical masses, normal ROM, no appreciable thyromegaly; no JVD Respiratory: Diminished to auscultation bilaterally with coarse breath sounds, no wheezing, rales, rhonchi or crackles. Normal respiratory effort and patient is not tachypenic. No accessory muscle use.  Unlabored breathing Cardiovascular: RRR, no murmurs / rubs / gallops. S1 and S2 auscultated. No extremity edema.  Abdomen: Soft, non-tender, non-distended. Bowel sounds positive.  GU: Deferred. Musculoskeletal: No clubbing / cyanosis of digits/nails. No joint deformity upper and lower extremities.  Skin: No rashes, lesions, ulcers on limited skin evaluation. No induration; Warm and dry.  Neurologic: Not as interactive today but does arouse to verbal stimuli slightly Psychiatric: Impaired judgment and insight.  Lethargic appearing and drowsy.. Normal mood and appropriate affect.   Data Reviewed: I have personally reviewed following labs and imaging studies  CBC: Recent Labs  Lab 06/06/21 0327 06/07/21 0342 06/08/21 0240 06/09/21 0142 06/10/21 0313  WBC 9.9 10.2 8.3 8.5 9.4  NEUTROABS  --   --  7.2 7.4 8.7*  HGB 9.1* 8.9* 7.3* 7.5* 7.3*  HCT 28.5* 27.8* 22.8* 23.5* 23.1*  MCV 98.6 98.9 99.1 100.0 99.1  PLT 180 191 177 204 856    Basic Metabolic Panel: Recent Labs  Lab 06/06/21 0327 06/07/21 0342 06/08/21 0240 06/09/21 0142 06/10/21 0313  NA 147* 148* 144 143 147*  K 3.7 3.7 3.6 3.9 3.7  CL 114* 116* 113* 113* 116*  CO2 26 27 25 24 24   GLUCOSE 147* 148* 149* 121* 125*  BUN 81* 83* 90* 94* 87*  CREATININE 2.72* 2.76* 2.85* 2.71* 2.58*  CALCIUM 11.1* 11.2* 10.7* 10.8* 11.0*  MG  --   --  2.2 2.3 2.5*  PHOS  --   --  3.9 4.0 4.9*    GFR: Estimated Creatinine Clearance: 26.2 mL/min (A) (by C-G formula based on SCr of 2.58 mg/dL (H)). Liver Function Tests: Recent Labs  Lab 06/04/21 0241 06/05/21 0421 06/08/21 0240  06/09/21 0142 06/10/21 0313  AST 28 23 21  33 36  ALT 12 11 12 15 14   ALKPHOS 39 31* 31* 31* 34*  BILITOT 0.9 0.5 0.6 0.5 0.6  PROT 6.8 6.2* 5.4* 5.8* 5.8*  ALBUMIN 3.0* 2.7* 2.4* 2.4* 2.4*    No results for input(s): LIPASE, AMYLASE in the last 168 hours. No results for input(s): AMMONIA in the last 168 hours. Coagulation Profile: Recent Labs  Lab 06/07/21 0342  INR 1.0    Cardiac Enzymes: No results for input(s): CKTOTAL, CKMB, CKMBINDEX, TROPONINI in the last 168 hours. BNP (last 3 results) Recent Labs    02/03/21 1100  PROBNP 635*    HbA1C: No results for input(s): HGBA1C in the last 72 hours. CBG: Recent Labs  Lab 06/09/21 2011 06/10/21 0008 06/10/21 0350 06/10/21 0750 06/10/21 1132  GLUCAP 114* 127* 125* 121* 123*    Lipid Profile:  No results for input(s): CHOL, HDL, LDLCALC, TRIG, CHOLHDL, LDLDIRECT in the last 72 hours. Thyroid Function Tests: No results for input(s): TSH, T4TOTAL, FREET4, T3FREE, THYROIDAB in the last 72 hours. Anemia Panel: No results for input(s): VITAMINB12, FOLATE, FERRITIN, TIBC, IRON, RETICCTPCT in the last 72 hours. Sepsis Labs: Recent Labs  Lab 06/04/21 0241  PROCALCITON 0.14     Recent Results (from the past 240 hour(s))  Culture, blood (routine x 2)     Status: None   Collection Time: 06/01/21  8:14 AM   Specimen: BLOOD  Result Value Ref Range Status   Specimen Description BLOOD RIGHT ANTECUBITAL  Final   Special Requests   Final    BOTTLES DRAWN AEROBIC AND ANAEROBIC Blood Culture adequate volume   Culture   Final    NO GROWTH 5 DAYS Performed at [facility], [address], [place], [place] [address]    Report Status 06/06/2021 FINAL  Final  Culture, blood (routine x 2)     Status: None   Collection Time: 06/01/21  8:31 AM   Specimen: BLOOD RIGHT HAND  Result Value Ref Range Status   Specimen Description BLOOD RIGHT HAND  Final   Special Requests   Final    BOTTLES DRAWN AEROBIC AND ANAEROBIC Blood  Culture adequate volume   Culture   Final    NO GROWTH 5 DAYS Performed at [facility], [place] [address] [place], [place] [address]    Report Status 06/06/2021 FINAL  Final  Resp Panel by RT-PCR (Flu A&B, Covid) Nasopharyngeal Swab     Status: None   Collection Time: 06/01/21  2:02 PM   Specimen: Nasopharyngeal Swab; Nasopharyngeal(NP) swabs in vial transport medium  Result Value Ref Range Status   SARS Coronavirus 2 by RT PCR NEGATIVE NEGATIVE Final    Comment: (NOTE) SARS-CoV-2 target nucleic acids are NOT DETECTED.  The SARS-CoV-2 RNA is generally detectable in upper respiratory specimens during the acute phase of infection. The lowest concentration of SARS-CoV-2 viral copies this assay can detect is 138 copies/mL. A negative result does not preclude SARS-Cov-2 infection and should not be used as the sole basis for treatment or other patient management decisions. A negative result may occur with  improper specimen collection/handling, submission of specimen other than nasopharyngeal swab, presence of viral mutation(s) within the areas targeted by this assay, and inadequate number of viral copies(<138 copies/mL). A negative result must be combined with clinical observations, patient history, and epidemiological information. The expected result is Negative.  Fact Sheet for Patients:  EntrepreneurPulse.com.au  Fact Sheet for Healthcare Providers:  IncredibleEmployment.be  This test is no t yet approved or cleared by the [place] FDA and  has been authorized for detection and/or diagnosis of SARS-CoV-2 by FDA under an Emergency Use Authorization (EUA). This EUA will remain  in effect (meaning this test can be used) for the duration of the COVID-19 declaration under Section 564(b)(1) of the Act, 21 U.S.C.section 360bbb-3(b)(1), unless the authorization is terminated  or revoked sooner.       Influenza A by PCR NEGATIVE NEGATIVE  Final   Influenza B by PCR NEGATIVE NEGATIVE Final    Comment: (NOTE) The Xpert Xpress SARS-CoV-2/FLU/RSV plus assay is intended as an aid in the diagnosis of influenza from Nasopharyngeal swab specimens and should not be used as a sole basis for treatment. Nasal washings and aspirates are unacceptable for Xpert Xpress SARS-CoV-2/FLU/RSV testing.  Fact Sheet for Patients: EntrepreneurPulse.com.au  Fact Sheet for  Healthcare Providers: IncredibleEmployment.be  This test is not yet approved or cleared by the [place] and has been authorized for detection and/or diagnosis of SARS-CoV-2 by FDA under an Emergency Use Authorization (EUA). This EUA will remain in effect (meaning this test can be used) for the duration of the COVID-19 declaration under Section 564(b)(1) of the Act, 21 U.S.C. section 360bbb-3(b)(1), unless the authorization is terminated or revoked.  Performed at [facility], [place] [address] [place], [place] [address]   Fungus culture, blood     Status: None (Preliminary result)   Collection Time: 06/05/21  1:43 PM   Specimen: BLOOD RIGHT HAND  Result Value Ref Range Status   Specimen Description BLOOD RIGHT HAND  Final   Special Requests   Final    BOTTLES DRAWN AEROBIC ONLY Blood Culture results may not be optimal due to an inadequate volume of blood received in culture bottles   Culture   Final    NO GROWTH 4 DAYS Performed at [facility], [place] [address] [place], [place] [address]    Report Status PENDING  Incomplete  Culture, blood (single)     Status: None (Preliminary result)   Collection Time: 06/06/21  7:33 AM   Specimen: BLOOD  Result Value Ref Range Status   Specimen Description BLOOD RIGHT ANTECUBITAL  Final   Special Requests   Final    BOTTLES DRAWN AEROBIC AND ANAEROBIC Blood Culture adequate volume   Culture   Final    NO GROWTH 3 DAYS Performed at [facility], [place] [address],  [place], [place] [address]    Report Status PENDING  Incomplete      RN Pressure Injury Documentation:     Estimated body mass index is 22.65 kg/m as calculated from the following:   Height as of this encounter: 5\' 10"  (1.778 m).   Weight as of this encounter: 71.6 kg.  Malnutrition Type:  Nutrition Problem: Severe Malnutrition Etiology: chronic illness (Alzheimers dementia, DM, CKD, CHF)   Malnutrition Characteristics:  Signs/Symptoms: moderate fat depletion, severe fat depletion, moderate muscle depletion, severe muscle depletion, percent weight loss Percent weight loss: 23 %   Nutrition Interventions:  Interventions: Tube feeding, MVI, Prostat   Radiology Studies: IR GASTROSTOMY TUBE MOD SED  Result Date: 06/09/2021 INDICATION: 70 year old male referred for gastrostomy EXAM: PERC PLACEMENT GASTROSTOMY MEDICATIONS: 2 g Ancef; Antibiotics were administered within 1 hour of the procedure. Scratch ANESTHESIA/SEDATION: Versed 1.0 mg IV; Fentanyl 25 mcg IV Moderate Sedation Time:  15 minutes The patient was continuously monitored during the procedure by the interventional radiology nurse under my direct supervision. CONTRAST:  15mL OMNIPAQUE IOHEXOL 300 MG/ML SOLN - administered into the gastric lumen. FLUOROSCOPY TIME:  Fluoroscopy Time: 6 minutes 18 seconds (16 mGy). COMPLICATIONS: 15 cc PROCEDURE: Informed written consent was obtained from the patient and the patient's family after a thorough discussion of the procedural risks, benefits and alternatives. All questions were addressed. Maximal Sterile Barrier Technique was utilized including caps, mask, sterile gowns, sterile gloves, sterile drape, hand hygiene and skin antiseptic. A timeout was performed prior to the initiation of the procedure. The epigastrium was prepped with Betadine in a sterile fashion, and a sterile drape was applied covering the operative field. A sterile gown and sterile gloves were used for the procedure. A  5-French orogastric tube is placed under fluoroscopic guidance. Scout imaging of the abdomen confirms barium within the transverse colon. The stomach was distended with gas. Under fluoroscopic guidance,  an 60 gauge needle was utilized to puncture the anterior wall of the body of the stomach. The puncture was made just superficial to a site ventral hernia, at a site where we perceived the most residual muscular tone persisted. An Amplatz wire was advanced through the needle passing a T fastener into the lumen of the stomach. The T fastener was secured for gastropexy. A 9-French sheath was inserted. A snare was advanced through the 9-French sheath. A Britta Mccreedy was advanced through the orogastric tube. It was snared then pulled out the oral cavity, pulling the snare, as well. The leading edge of the gastrostomy was attached to the snare. It was then pulled down the esophagus and out the percutaneous site. Tube secured in place. Contrast was injected. Patient tolerated the procedure well and remained hemodynamically stable throughout. No complications were encountered and no significant blood loss encountered. IMPRESSION: Status post fluoroscopic placed percutaneous gastrostomy tube, with 20 [place] pull-through. Signed, Dulcy Fanny. Earleen Newport, DO [facility] Radiology Electronically Signed   By: Corrie Mckusick D.O.   On: 06/09/2021 17:25   DG CHEST PORT 1 VIEW  Result Date: 06/09/2021 CLINICAL DATA:  Shortness of breath, history of stroke EXAM: PORTABLE CHEST 1 VIEW COMPARISON:  06/07/2021 FINDINGS: Mild cardiomegaly. Partially imaged enteric feeding tube. Both lungs are clear. The visualized skeletal structures are unremarkable. IMPRESSION: Mild cardiomegaly. No acute abnormality of the lungs in AP portable projection. Electronically Signed   By: Eddie Candle M.D.   On: 06/09/2021 08:51   DG Swallowing Func-Speech Pathology  Result Date: 06/08/2021 Formatting of this result  is different from the original. Objective Swallowing Evaluation: Type of Study: MBS-Modified Barium Swallow Study  Patient Details Name: Casey Greene MRN: [id number] Date of Birth: 28-Feb-1951 Today's Date: 06/08/2021 Time: SLP Start Time (ACUTE ONLY): 1422 -SLP Stop Time (ACUTE ONLY): 1435 SLP Time Calculation (min) (ACUTE ONLY): 13 min Past Medical History: Past Medical History: Diagnosis Date  Anemia of chronic disease 03/28/2018  Ascending aorta dilation 07/29/2019  (a) Echo 07/29/19 23mm  Bleeding per rectum 12/09/2018  CKD (chronic kidney disease) stage 3, GFR 30-59 ml/min ([place]) 10/13/2014  Discussed with patient importance of good BP control, avoiding nephrotoxic meds/supplements in preventing further progression  Constipation 12/01/2018  Corn or callus 06/11/2019  Decreased appetite   Diastolic congestive heart failure ([place]) 02/03/2021  DOE (dyspnea on exertion) 06/17/2020  Dysrhythmia   Edema 07/09/2016  Elevated hemoglobin A1c 06/11/2019  Essential hypertension 06/09/2014  Overview:  metoprolol caused bradycardia amlodipine causes headache, leg swelling  Last Assessment & Plan:  We had a long discussion regarding goals of treatment of HTN to prevent worsening cardiac and renal disease.  We also discussed options to change his regimen.  He states that he would prefer to seek a practitioner who can recommend herbal remedies as he feels thhis will be a healthier path f  Hepatitis C virus infection cured after antiviral drug therapy 07/05/2014  History of gout   History of urinary frequency 12/05/2020  Hyperlipidemia 06/29/2014  Hypokalemia 12/09/2018  Laryngeal mass 06/17/2020  Localized osteoarthritis of left knee 06/11/2020  Left knee injection-06/10/2020  LVH (left ventricular hypertrophy) due to hypertensive disease   (a) echo 07/29/19 severe concentric LVH  Major depressive disorder 02/27/2016  Mass of thyroid region 03/28/2018  Mild neurocognitive disorder due to multiple etiologies 12/30/2020  Nonsustained  ventricular tachycardia 07/23/2019  (a) Zio monitor 06/2019. Subsequent echo with severe concentric LVH, but normal EF 55-60%  Onychomycosis 12/01/2013  PAC (  premature atrial contraction)   (a) ZIO 06/2019. Asymptomatic  Prostate cancer 1997  Dx in 1997, never treated, taking herbal supplements. S/p prostatectomy 2017  PVD (peripheral vascular disease) 12/01/2013  Stroke   Per 12/26/20 MRI - remote pontine and bilateral thalamic infarcts   Urinary retention  Past Surgical History: Past Surgical History: Procedure Laterality Date  ACHILLES TENDON REPAIR    CERVICAL DISC SURGERY    neck  COLONOSCOPY    before 2010   LYMPHADENECTOMY Bilateral 01/26/2016  Procedure: EXTENDED BILATERAL PELVIC LYMPHADENECTOMY;  Surgeon: Raynelle Bring, MD;  Location: [facility];  Service: Urology;  Laterality: Bilateral;  NOSE SURGERY    in high school-nasal fracturex2  ROBOT ASSISTED LAPAROSCOPIC RADICAL PROSTATECTOMY N/A 01/26/2016  Procedure: XI ROBOTIC ASSISTED LAPAROSCOPIC RADICAL PROSTATECTOMY LEVEL 3;  Surgeon: Raynelle Bring, MD;  Location: [facility];  Service: Urology;  Laterality: N/A; HPI: 70 yr old admitted with LUE and LLE weakness. CT revealed acute right thalamic hemorrhage. 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), DM, mild neurocognitive disorder d/t multiple etiologies, prostate cancer, pontine/bil thalamic infarcts 12/26/20  Subjective: requires cueing, fluctuating alertness Assessment / Plan / Recommendation [facility] IP CLINICAL IMPRESSIONS 06/08/2021 Clinical Impression Pt presents with a severe oral and moderate pharyngeal dysphagia likely of multifactorial etiology including recent CVA with fluctuating mentation. While pt was able to arouse with cueing, his overall sustained attention was diminished as compared to clinical interaction this am which suspected to contribute to worsened outcome. Pt with poor labial seal with diffuse oral residuals (worst with thicker POs), anterior sulcus pocketing, decreased bolus cohesion,  reduced posterior bolus propulsion, and frequent anterior spillage. Pt with mistimed epiglottic deflection, reduced pharyngeal stripping wave, poor base of tongue retraction, and diminished laryngeal sensation. This allowed for intermittent during the swallow laryngeal penetration of nectar thick liquids and post swallow spillage of residuals with all consistencies (thin, nectar, honey thick, and puree; (PAS- 3,5). Pt not able to consistently implement throat clear, cough, or volitional swallow to aid airway protection. While no appreciable aspiration exhibited during exam, pt very high risk for aspiration across a meal. Per MD report, pts family not ready for comfort diet with known aspiration risk. Pt to remain NPO with continued ST intervention and will likely pursue PEG. SLP to continue to follow. SLP Visit Diagnosis Dysphagia, oropharyngeal phase (R13.12) Attention and concentration deficit following -- Frontal lobe and executive function deficit following -- Impact on safety and function Moderate aspiration risk;Severe aspiration risk   [facility] IP TREATMENT RECOMMENDATION 06/08/2021 Treatment Recommendations Therapy as outlined in treatment plan below   Prognosis 06/08/2021 Prognosis for Safe Diet Advancement Good Barriers to Reach Goals Time post onset;Cognitive deficits Barriers/Prognosis Comment -- [facility] IP DIET RECOMMENDATION 06/08/2021 SLP Diet Recommendations NPO Liquid Administration via -- Medication Administration Via alternative means Compensations -- Postural Changes --   [facility] IP OTHER RECOMMENDATIONS 06/08/2021 Recommended Consults -- Oral Care Recommendations Oral care QID Other Recommendations --   [facility] IP FOLLOW UP RECOMMENDATIONS 06/08/2021 Follow up Recommendations Inpatient Rehab;24 hour supervision/assistance   [facility] IP FREQUENCY AND DURATION 06/08/2021 Speech Therapy Frequency (ACUTE ONLY) min 2x/week Treatment Duration 2 weeks      [facility] IP ORAL PHASE 06/08/2021 Oral Phase Impaired Oral - Pudding Teaspoon --  Oral - Pudding Cup -- Oral - Honey Teaspoon Left anterior bolus loss;Right anterior bolus loss;Impaired mastication;Weak lingual manipulation;Incomplete tongue to palate contact;Reduced posterior propulsion;Holding of bolus;Pocketing in anterior sulcus;Lingual/palatal residue;Delayed oral transit;Decreased bolus cohesion Oral - Honey Cup Left anterior bolus loss;Right anterior  bolus loss;Impaired mastication;Weak lingual manipulation;Incomplete tongue to palate contact;Reduced posterior propulsion;Holding of bolus;Pocketing in anterior sulcus;Lingual/palatal residue;Delayed oral transit;Decreased bolus cohesion Oral - Nectar Teaspoon -- Oral - Nectar Cup Left anterior bolus loss;Right anterior bolus loss;Weak lingual manipulation;Reduced posterior propulsion;Holding of bolus;Pocketing in anterior sulcus;Lingual/palatal residue;Delayed oral transit;Decreased bolus cohesion Oral - Nectar Straw -- Oral - Thin Teaspoon -- Oral - Thin Cup Delayed oral transit;Left anterior bolus loss;Right anterior bolus loss;Weak lingual manipulation;Incomplete tongue to palate contact;Reduced posterior propulsion;Holding of bolus;Pocketing in anterior sulcus;Lingual/palatal residue Oral - Thin Straw -- Oral - Puree Delayed oral transit;Decreased bolus cohesion;Weak lingual manipulation;Lingual pumping;Reduced posterior propulsion;Holding of bolus;Lingual/palatal residue;Pocketing in anterior sulcus Oral - Mech Soft -- Oral - Regular -- Oral - Multi-Consistency -- Oral - Pill -- Oral Phase - Comment --  [facility] IP PHARYNGEAL PHASE 06/08/2021 Pharyngeal Phase Impaired Pharyngeal- Pudding Teaspoon -- Pharyngeal -- Pharyngeal- Pudding Cup -- Pharyngeal -- Pharyngeal- Honey Teaspoon Delayed swallow initiation-vallecula;Reduced tongue base retraction;Pharyngeal residue - valleculae Pharyngeal -- Pharyngeal- Honey Cup Delayed swallow initiation-vallecula;Reduced pharyngeal peristalsis;Reduced epiglottic inversion;Reduced tongue base  retraction;Penetration/Apiration after swallow;Pharyngeal residue - pyriform;Pharyngeal residue - valleculae;Reduced laryngeal elevation Pharyngeal Material enters airway, remains ABOVE vocal cords and not ejected out;Material enters airway, CONTACTS cords and not ejected out Pharyngeal- Nectar Teaspoon -- Pharyngeal -- Pharyngeal- Nectar Cup Penetration/Aspiration during swallow;Reduced laryngeal elevation;Reduced airway/laryngeal closure;Reduced tongue base retraction;Reduced pharyngeal peristalsis;Reduced epiglottic inversion;Reduced anterior laryngeal mobility;Pharyngeal residue - valleculae;Pharyngeal residue - pyriform Pharyngeal Material enters airway, remains ABOVE vocal cords and not ejected out;Material enters airway, CONTACTS cords and not ejected out Pharyngeal- Nectar Straw -- Pharyngeal -- Pharyngeal- Thin Teaspoon -- Pharyngeal -- Pharyngeal- Thin Cup Reduced epiglottic inversion;Reduced pharyngeal peristalsis;Reduced anterior laryngeal mobility;Reduced laryngeal elevation;Reduced airway/laryngeal closure;Reduced tongue base retraction;Penetration/Apiration after swallow;Pharyngeal residue - valleculae;Pharyngeal residue - pyriform Pharyngeal Material enters airway, remains ABOVE vocal cords and not ejected out;Material enters airway, CONTACTS cords and not ejected out Pharyngeal- Thin Straw -- Pharyngeal -- Pharyngeal- Puree Delayed swallow initiation-vallecula;Reduced pharyngeal peristalsis;Reduced epiglottic inversion;Reduced anterior laryngeal mobility;Reduced laryngeal elevation;Reduced tongue base retraction;Penetration/Apiration after swallow;Pharyngeal residue - valleculae;Pharyngeal residue - pyriform Pharyngeal Material enters airway, remains ABOVE vocal cords and not ejected out;Material enters airway, CONTACTS cords and not ejected out Pharyngeal- Mechanical Soft -- Pharyngeal -- Pharyngeal- Regular -- Pharyngeal -- Pharyngeal- Multi-consistency -- Pharyngeal -- Pharyngeal- Pill --  Pharyngeal -- Pharyngeal Comment --  CHL IP CERVICAL ESOPHAGEAL PHASE 06/08/2021 Cervical Esophageal Phase WFL Pudding Teaspoon -- Pudding Cup -- Honey Teaspoon -- Honey Cup -- Nectar Teaspoon -- Nectar Cup -- Nectar Straw -- Thin Teaspoon -- Thin Cup -- Thin Straw -- Puree -- Mechanical Soft -- Regular -- Multi-consistency -- Pill -- Cervical Esophageal Comment -- Chelsea E Hartness MA, CCC-SLP 06/08/2021, 3:18 PM               Scheduled Meds:  carvedilol  25 mg Per NG tube BID WC   chlorhexidine  15 mL Mouth Rinse BID   Chlorhexidine Gluconate Cloth  6 each Topical Q0600   feeding supplement (PROSource TF)  45 mL Per Tube BID   free water  400 mL Per Tube Q6H   heparin injection (subcutaneous)  5,000 Units Subcutaneous Q8H   hydrALAZINE  100 mg Per Tube TID   mouth rinse  15 mL Mouth Rinse q12n4p   multivitamin with minerals  1 tablet Per Tube Daily   neomycin-bacitracin-polymyxin  1 application Topical Daily   pantoprazole sodium  40 mg Per Tube QHS   senna-docusate  1 tablet Per Tube BID   sodium  chloride flush  3 mL Intravenous Once   Continuous Infusions:  dextrose 75 mL/hr at 06/10/21 0912   feeding supplement (OSMOLITE 1.5 CAL) 1,000 mL (06/10/21 1327)    LOS: 13 days   Kerney Elbe, DO [facility] PAGER is on [place]  If 7PM-7AM, please contact night-coverage [email or web address]

## 2021-06-10 NOTE — Plan of Care (Signed)
  Problem: Education: Goal: Knowledge of disease or condition will improve Outcome: Progressing Goal: Knowledge of secondary prevention will improve Outcome: Progressing Goal: Knowledge of patient specific risk factors addressed and post discharge goals established will improve Outcome: Progressing Goal: Individualized Educational Video(s) Outcome: Progressing   Problem: Coping: Goal: Will identify appropriate support needs Outcome: Progressing   Problem: Intracerebral Hemorrhage Tissue Perfusion: Goal: Complications of Intracerebral Hemorrhage will be minimized Outcome: Progressing   Problem: Intracerebral Hemorrhage Tissue Perfusion: Goal: Complications of Intracerebral Hemorrhage will be minimized Outcome: Progressing

## 2021-06-11 DIAGNOSIS — I610 Nontraumatic intracerebral hemorrhage in hemisphere, subcortical: Secondary | ICD-10-CM | POA: Diagnosis not present

## 2021-06-11 DIAGNOSIS — E43 Unspecified severe protein-calorie malnutrition: Secondary | ICD-10-CM | POA: Diagnosis not present

## 2021-06-11 DIAGNOSIS — T17908A Unspecified foreign body in respiratory tract, part unspecified causing other injury, initial encounter: Secondary | ICD-10-CM | POA: Diagnosis not present

## 2021-06-11 DIAGNOSIS — G9341 Metabolic encephalopathy: Secondary | ICD-10-CM | POA: Diagnosis not present

## 2021-06-11 LAB — GLUCOSE, CAPILLARY
Glucose-Capillary: 136 mg/dL — ABNORMAL HIGH (ref 70–99)
Glucose-Capillary: 140 mg/dL — ABNORMAL HIGH (ref 70–99)
Glucose-Capillary: 146 mg/dL — ABNORMAL HIGH (ref 70–99)
Glucose-Capillary: 147 mg/dL — ABNORMAL HIGH (ref 70–99)
Glucose-Capillary: 159 mg/dL — ABNORMAL HIGH (ref 70–99)
Glucose-Capillary: 161 mg/dL — ABNORMAL HIGH (ref 70–99)
Glucose-Capillary: 170 mg/dL — ABNORMAL HIGH (ref 70–99)

## 2021-06-11 LAB — CBC WITH DIFFERENTIAL/PLATELET
Abs Immature Granulocytes: 0.05 10*3/uL (ref 0.00–0.07)
Basophils Absolute: 0 10*3/uL (ref 0.0–0.1)
Basophils Relative: 0 %
Eosinophils Absolute: 0 10*3/uL (ref 0.0–0.5)
Eosinophils Relative: 1 %
HCT: 22.3 % — ABNORMAL LOW (ref 39.0–52.0)
Hemoglobin: 7.3 g/dL — ABNORMAL LOW (ref 13.0–17.0)
Immature Granulocytes: 1 %
Lymphocytes Relative: 4 %
Lymphs Abs: 0.3 10*3/uL — ABNORMAL LOW (ref 0.7–4.0)
MCH: 32.4 pg (ref 26.0–34.0)
MCHC: 32.7 g/dL (ref 30.0–36.0)
MCV: 99.1 fL (ref 80.0–100.0)
Monocytes Absolute: 0.4 10*3/uL (ref 0.1–1.0)
Monocytes Relative: 5 %
Neutro Abs: 6.9 10*3/uL (ref 1.7–7.7)
Neutrophils Relative %: 89 %
Platelets: 193 10*3/uL (ref 150–400)
RBC: 2.25 MIL/uL — ABNORMAL LOW (ref 4.22–5.81)
RDW: 13.7 % (ref 11.5–15.5)
WBC: 7.8 10*3/uL (ref 4.0–10.5)
nRBC: 0 % (ref 0.0–0.2)

## 2021-06-11 LAB — COMPREHENSIVE METABOLIC PANEL
ALT: 10 U/L (ref 0–44)
AST: 35 U/L (ref 15–41)
Albumin: 2.3 g/dL — ABNORMAL LOW (ref 3.5–5.0)
Alkaline Phosphatase: 32 U/L — ABNORMAL LOW (ref 38–126)
Anion gap: 6 (ref 5–15)
BUN: 90 mg/dL — ABNORMAL HIGH (ref 8–23)
CO2: 23 mmol/L (ref 22–32)
Calcium: 10.7 mg/dL — ABNORMAL HIGH (ref 8.9–10.3)
Chloride: 112 mmol/L — ABNORMAL HIGH (ref 98–111)
Creatinine, Ser: 2.46 mg/dL — ABNORMAL HIGH (ref 0.61–1.24)
GFR, Estimated: 27 mL/min — ABNORMAL LOW (ref >60)
Glucose, Bld: 149 mg/dL — ABNORMAL HIGH (ref 70–99)
Potassium: 3.6 mmol/L (ref 3.5–5.1)
Sodium: 141 mmol/L (ref 135–145)
Total Bilirubin: 0.8 mg/dL (ref 0.3–1.2)
Total Protein: 5.7 g/dL — ABNORMAL LOW (ref 6.5–8.1)

## 2021-06-11 LAB — CULTURE, BLOOD (SINGLE)
Culture: NO GROWTH
Special Requests: ADEQUATE

## 2021-06-11 LAB — MAGNESIUM: Magnesium: 2.6 mg/dL — ABNORMAL HIGH (ref 1.7–2.4)

## 2021-06-11 LAB — PHOSPHORUS: Phosphorus: 4.2 mg/dL (ref 2.5–4.6)

## 2021-06-11 MED ORDER — POLYSACCHARIDE IRON COMPLEX 150 MG PO CAPS
150.0000 mg | ORAL_CAPSULE | Freq: Every day | ORAL | Status: DC
  Administered 2021-06-11 – 2021-06-16 (×6): 150 mg
  Filled 2021-06-11 (×7): qty 1

## 2021-06-11 NOTE — Plan of Care (Signed)
  Problem: Education: Goal: Knowledge of disease or condition will improve Outcome: Progressing Goal: Knowledge of secondary prevention will improve Outcome: Progressing Goal: Knowledge of patient specific risk factors addressed and post discharge goals established will improve Outcome: Progressing Goal: Individualized Educational Video(s) Outcome: Progressing   Problem: Coping: Goal: Will identify appropriate support needs Outcome: Progressing   Problem: Health Behavior/Discharge Planning: Goal: Ability to manage health-related needs will improve Outcome: Progressing   Problem: Self-Care: Goal: Ability to participate in self-care as condition permits will improve Outcome: Progressing Goal: Verbalization of feelings and concerns over difficulty with self-care will improve Outcome: Progressing Goal: Ability to communicate needs accurately will improve Outcome: Progressing   Problem: Nutrition: Goal: Risk of aspiration will decrease Outcome: Progressing Goal: Dietary intake will improve Outcome: Progressing   Problem: Intracerebral Hemorrhage Tissue Perfusion: Goal: Complications of Intracerebral Hemorrhage will be minimized Outcome: Progressing   Problem: Education: Goal: Knowledge of General Education information will improve Description: Including pain rating scale, medication(s)/side effects and non-pharmacologic comfort measures Outcome: Progressing   Problem: Health Behavior/Discharge Planning: Goal: Ability to manage health-related needs will improve Outcome: Progressing   Problem: Clinical Measurements: Goal: Ability to maintain clinical measurements within normal limits will improve Outcome: Progressing Goal: Will remain free from infection Outcome: Progressing Goal: Diagnostic test results will improve Outcome: Progressing Goal: Respiratory complications will improve Outcome: Progressing Goal: Cardiovascular complication will be avoided Outcome:  Progressing   Problem: Activity: Goal: Risk for activity intolerance will decrease Outcome: Progressing   Problem: Nutrition: Goal: Adequate nutrition will be maintained Outcome: Progressing   Problem: Coping: Goal: Level of anxiety will decrease Outcome: Progressing   Problem: Elimination: Goal: Will not experience complications related to bowel motility Outcome: Progressing Goal: Will not experience complications related to urinary retention Outcome: Progressing   Problem: Pain Managment: Goal: General experience of comfort will improve Outcome: Progressing   Problem: Safety: Goal: Ability to remain free from injury will improve Outcome: Progressing   Problem: Skin Integrity: Goal: Risk for impaired skin integrity will decrease Outcome: Progressing

## 2021-06-11 NOTE — TOC Progression Note (Signed)
Transition of Care [facility]) - Progression Note    Patient Details  Name: Casey Greene MRN: [id number] Date of Birth: 1951/09/27  [facility] CM/SW Contact  Joanne Chars, LCSW Phone Number: 06/11/2021, 3:22 PM  Clinical Narrative:   CSW spoke with pt wife Casey Greene.  She asked for update on possible beds at [facility] and [facility]-CSW noted [facility] had reached out, no response yet.  She asked about [facility] (both have declined) and [facility].  CSW informed that very difficult to get into [facility] but she asked that referral be sent out anyway, which was done.     Expected Discharge Plan: [place] Barriers to Discharge: Continued Medical Work up, Ship broker  Expected Discharge Plan and Services Expected Discharge Plan: [place] Choice: [place] Living arrangements for the past 2 months: Apartment                                       Social Determinants of Health (SDOH) Interventions    Readmission Risk Interventions Readmission Risk Prevention Plan 10/09/2020  Medication Screening Complete  Transportation Screening Complete  Some recent data might be hidden

## 2021-06-11 NOTE — Progress Notes (Signed)
PROGRESS NOTE    Casey Greene  QHU:[id number] DOB: [birth year] DOA: 05/28/2021 PCP: [name], MD   Brief Narrative:  The patient is a 70 year old African-American male with a past medical history significant for but not limited to history of CVA, mild neurocognitive dysfunction, PAD, chronic diastolic CHF, diabetes mellitus type 2, hypertension, chronic kidney disease stage IV as well as other comorbidities who presented with left-sided hemiplegia and found to have an acute right thalamic hemorrhage.  He is admitted by neurology to the neurointensive care unit and stabilized and then subsequently transferred to the hospitalist service.  Upon 7 7 he became febrile and there was suspicion for microaspiration so he started on IV Unasyn.  Given his lack of improvement and his family's wishes for aggressive care interventional radiology was consulted for possible PEG tube placement.  Palliative care goals of care happening at this time and his mental status is not improving.  He is currently on aspirin and Plavix being given.  Throughout core track tube feeding.  Given his suspicion for aspiration pneumonia and his decreased mental status with ongoing copious secretions he was initiated on breathing treatments and ID was consulted.  He has a very poor prognosis and palliative care discussing with family about possible PEG tube placement.  Continuing goals of care discussion at this time.  Patient's current significant events and significant studies are as follows: 7/3>> presented with left-sided hemiplegia-right thalamic hemorrhage on CT-admit to neuro ICU 7/7>> transfer to [facility] 7/7>> febrile-suspicion for microaspiration-Unasyn started   Significant studies: 6/12>> TTE: EF 46-56%, grade 2 diastolic dysfunction. 7/3>> acute right thalamic hemorrhage with intraventricular extension and hydrocephalus. 7/3>> CT head: Unchanged size of intraparenchymal hemorrhage 7/4>> MRI brain: 2 small acute infarct  in rt parietal lobe, acute right thalamic hemorrhage with intraventricular extension. 7/4>> MRA brain: No significant stenosis or aneurysm 7/4>> MRA neck: No significant stenosis 7/4>> LDL: 50 7/4>> A1c: 5.1 7/7>> CXR: No pneumonia 7/8>> CXR: No pneumonia 7/9>> CT chest: Some scattered secretions in the central trachea/right mainstem bronchus-lungs are clear. 7/9>> CT abdomen: Diffusely fluid-filled appearance of the colon with lack of formed stools, complex multilobulated ventral hernia.  7/12>> CT head: Decreased right thalamic/intraventricular hemorrhage since last week 7/12>> CXR: No pneumonia 06/06/2021 patient had an EEG done which was suggestive of moderate diffuse encephalopathy with nonspecific etiology but there is no seizures or epileptiform discharges seen throughout the recording 06/07/21 patient remains lethargic 06/09/21 Mental Status is improving a little but still lethargic and PEG Tube placed 06/10/21 TF started through PEG   Assessment & Plan:   Active Problems:   ICH (intracerebral hemorrhage) (HCC)   Protein-calorie malnutrition, severe  Right thalamic intracranial hemorrhage with intraventricular extension and hydrocephalus: -Neurological deficits remain unchanged-left-sided hemiplegia/dysphagia persists -Neurology recommending to keep SBP less than 160-bit more lethargic today (repeat CT head stable/ABG without hypercarbia).   -Continue supportive care-continues to have dysphagia and accumulates secretion-family desires full scope of treatment (see palliative care note)-have consulted IR for PEG tube placement but after goals of care discussion the family with palliative they are currently-hold to reevaluate and assess -Will conider resuming Robinul for secretions -Continues to be hard to arouse (CThead/ABG done earlier this am)-coughing-grimaces to pain-per RN opens eyes at time. Dr. Sonia Side Spoke with Stroke MD-Dr Erlinda Hong who evaluated and recommended MRI Brain and EEG. -MRI  Brain showed "Slightly decreased size of right thalamic intraparenchymal hematoma with  intraventricular extension. Numerous chronic microhemorrhages in a predominantly central distribution, consistent with chronic hypertensive angiopathy. Chronic  ischemic microangiopathy" -EEG showed "This study is suggestive of moderate diffuse encephalopathy, nonspecific etiology. No seizures or epileptiform discharges were seen throughout the recording."  -Given issues with lethargy-stop both clonidine and Robinul (was already lethargic this am-when these agents were started)   Dysphagia:  -Continues to have significant dysphagia-have consulted IR for PEG tube placement  and Palliative discussing with family and family weighing risks vs. Benefits of PEG Placement  -Family wants PEG and all other life-prolonging measures per palliative discussion but patient is now DNR/DNI -PEG Placed 06/09/21 and TF started via the PEG   Fever-suspicion for microaspiration:  Acute respiratory failure with hypoxia -High suspicion for microaspiration-accumulating secretions-although no parenchymal disease seen on imaging studies-has secretions/mucus in his airways (seen on CT chest).   -Doubt UTI-UA benign-even though he has some bilateral perinephric standing on CT.   -Suspect that this is either microaspiration-or central fever.  Appreciate ID input-recommendations are for total of 7 days of Unasyn.  IV Unasyn now stopped  -We will add Xopenex and Atrovent -ID recommending follow up on repeat Blood Cx on 06/06/21 and following Lower extremity Duplex; blood cultures from 06/06/2021 showed no growth to date 4 days and lower extremity duplex showed no evidence of any DVT -Aspiration Precautions   Episode of hematochezia on 7/9:  -CBC stable-none since 7/9  -plan is to watch closely for recurrence.   -Hemoglobin relatively stable last few days however dropped to the Low 7's -Checked Anemia Panel and showed an iron level of 29, he  has a CO2 of 6, TIBC of 235, saturation ratios of 12%, ferritin level of 198, folate level 12.9, vitamin B12 697 -We will start Niferex 150 mg p.o. daily through PEG tube -Patient's hemoglobin/hematocrit has been relatively stable and gone from 8.5/26.9 -> 9.1/28.5 -> 8.9/27.8 -> 7.3/22.8 -> 7.5/23.5 -> 7.3/23.1 -> 7.3/22.3; if necessary will transfuse 1 unit PRBCs -Continue to Monitor for S/Sx of Bleeding -Repeat CBC in the AM  HyperNatremia/Hyperchloremia -Improving Na+ is now 141 and now Chloride Level is 112; C/w Free Water Flushes -Continued IV fluid hydration from normal saline to D5W.  Now sodium level is now 147 and increased Rate of D5W to 75 mL/hr -Continue monitor and trend and repeat CMP in a.m.   HTN: -BP creeping up-goal SBP less than 160-added clonidine but has been held as above -continue Coreg/hydralazine.  Losartan on hold due to fluctuating renal function. -Continue to Monitor BP per Protocol -Last BP was 125/71   HLD -On statins prior to this hospital stay-resume when oral intake established.   HFpEF -Euvolemic on exam -Strict I's and O's and Daily Weights -Continue to Monitor Volume Status given that he is on IVF; Patient is -9.867 Liters    AKI CKD stage IV Hyperphosphatemia  -AKI improving slowly after starting normal saline but will change to D5W.   -Continue to hold losartan.   -Phos Level was 4.9 -BUN/Cr went from 83/2.98 -> 81/2.72 -> 83/2.76 -> 90/2.85 -> 94/2.71 -> 87/2.58 -> 90/2.46 -No signs of urinary retention-check frequent bladder scans.   History of mild neurocognitive dysfunction -Resume Aricept when able.   Normocytic anemia:  -Due to CKD-no indication for transfusion at this time -follow CBC daily -Hemoglobin relatively stable last few days however dropped to the Low 7's -Check Anemia Panel today -Patient's hemoglobin/hematocrit has been relatively stable and gone from 8.5/26.9 -> 9.1/28.5 -> 8.9/27.8 -> 7.3/22.8 -> 7.5/23.5 -> 7.3/23.1;  if necessary will transfuse 1 unit PRBCs -Repeat CBC in the AM  Palliative care: Currently now DNR/DNI -Appreciate palliative care input-patient/family desires full scope of treatment and PEG and all life prolonging measures. Dr. Sloan Leiter has had repeated discussions with spouse-she is aware of the issue with aspiration pneumonia-and placing a PEG tube will not minimize risk of aspiration.   -Given ongoing lethargy-aspiration issues/accumulation of secretions-with lack of any significant improvement-he remains with poor prognosis-and at risk for further decompensation.  He Spoke at length with spouse on 7/12 and relayed concerns againhave implored her to reexamine goals of care -Palliative Care reconsulted for [place] and continuing current Plan of Care and recommending starting with PEG feedings and recommending discharging to SNF in next day or 2 pending bed availability   Severe malnutrition in the context of chronic illness -Evidenced by moderate fat depletion, severe fat depletion and 23% weight loss -Nutritionist consulted and currently on tube feedings with a multivitamin and prostat -Estimated body mass index is 22.71 kg/m as calculated from the following:   Height as of this encounter: 5\' 10"  (1.778 m).   Weight as of this encounter: 71.8 kg. -SLP evaluated and did MBS but patient failed miserably so will need PEG tube placement at this time given that he has oral deficits and moderate pharyngeal deficits. -We will pursue PEG tube placement and IR consulted and likely can be done today given that his Fever Curve improved over the last 24-48 hours  -PEG Placed and tolerating feeds   DVT prophylaxis: Heparin 5,000 sq q8h Code Status: FULL CODE  Family Communication: No family present at bedside  Disposition Plan: SNF in the next few days   Status is: Inpatient  Remains inpatient appropriate because:Unsafe d/c plan, IV treatments appropriate due to intensity of illness or inability to  take PO, and Inpatient level of care appropriate due to severity of illness  Dispo: The patient is from: Home              Anticipated d/c is to:  TBD              Patient currently is not medically stable to d/c.   Difficult to place patient No  Consultants:  Palliative Care Neurology  PCCM   Procedures: As above  Antimicrobials:  Anti-infectives (From admission, onward)    Start     Dose/Rate Route Frequency Ordered Stop   06/09/21 1600  ceFAZolin (ANCEF) IVPB 2g/100 mL premix        2 g 200 mL/hr over 30 Minutes Intravenous STAT 06/09/21 1500 06/09/21 1556   06/09/21 1545  ceFAZolin (ANCEF) IVPB 1 g/50 mL premix  Status:  Discontinued        1 g 100 mL/hr over 30 Minutes Intravenous STAT 06/09/21 1457 06/09/21 1500   06/09/21 0900  ceFAZolin (ANCEF) IVPB 2g/100 mL premix        2 g 200 mL/hr over 30 Minutes Intravenous On call 06/09/21 0814 06/10/21 0900   06/01/21 1500  Ampicillin-Sulbactam (UNASYN) 3 g in sodium chloride 0.9 % 100 mL IVPB        3 g 200 mL/hr over 30 Minutes Intravenous Every 12 hours 06/01/21 1402 06/08/21 0405       Subjective: Seen and examined at bedside and still is a little lethargic but was much more awake and interactive today and answered questions appropriately.  Stated that he was in "a little bit of pain".  Slept okay.  No nausea or vomiting.  Denies any other concerns or complaints at this time and was much more interactive.  Objective: Vitals:   06/11/21 0446 06/11/21 0741 06/11/21 1120 06/11/21 1200  BP:  139/69 106/64 125/71  Pulse:  (!) 59 (!) 57 66  Resp:  15 (!) 21 18  Temp:  97.8 F (36.6 C) 98.6 F (37 C)   TempSrc:  Axillary Axillary   SpO2:  100% 100% 100%  Weight: 71.9 kg     Height:        Intake/Output Summary (Last 24 hours) at 06/11/2021 1414 Last data filed at 06/11/2021 1119 Gross per 24 hour  Intake 1829.19 ml  Output 1720 ml  Net 109.19 ml    Filed Weights   06/08/21 0500 06/10/21 0410 06/11/21 0446   Weight: 72 kg 71.6 kg 71.9 kg   Examination: Physical Exam:  Constitutional: Chronically ill-appearing elderly African-American male currently no acute distress is not as lethargic and more interactive today.  Admits to having some mild pain. Eyes: Sclera anicteric. ENMT: External Ears, Nose appear normal.  Mouth open with some drainage Neck: Appears normal, supple, no cervical masses, normal ROM, no appreciable thyromegaly; no JVD Respiratory: Diminished to auscultation bilaterally, no wheezing, rales, rhonchi or crackles. Normal respiratory effort and patient is not tachypenic. No accessory muscle use.  Unlabored breathing Cardiovascular: RRR, no murmurs / rubs / gallops. S1 and S2 auscultated. No carotid bruits.  Abdomen: Soft, non-tender, non-distended. Bowel sounds positive.  PEG tube in place now getting tube feeds GU: Deferred. Musculoskeletal: No clubbing / cyanosis of digits/nails. No joint deformity upper and lower extremities.  Skin: No rashes, lesions, ulcers on limited skin evaluation. No induration; Warm and dry.  Neurologic: More interactive today and arouses to verbal stimuli and actually is interactive today. Psychiatric: Slightly impaired judgment and insight.  A little lethargic but not as drowsy  Data Reviewed: I have personally reviewed following labs and imaging studies  CBC: Recent Labs  Lab 06/07/21 0342 06/08/21 0240 06/09/21 0142 06/10/21 0313 06/11/21 0503  WBC 10.2 8.3 8.5 9.4 7.8  NEUTROABS  --  7.2 7.4 8.7* 6.9  HGB 8.9* 7.3* 7.5* 7.3* 7.3*  HCT 27.8* 22.8* 23.5* 23.1* 22.3*  MCV 98.9 99.1 100.0 99.1 99.1  PLT 191 177 204 220 161    Basic Metabolic Panel: Recent Labs  Lab 06/07/21 0342 06/08/21 0240 06/09/21 0142 06/10/21 0313 06/11/21 0503  NA 148* 144 143 147* 141  K 3.7 3.6 3.9 3.7 3.6  CL 116* 113* 113* 116* 112*  CO2 27 25 24 24 23   GLUCOSE 148* 149* 121* 125* 149*  BUN 83* 90* 94* 87* 90*  CREATININE 2.76* 2.85* 2.71* 2.58* 2.46*   CALCIUM 11.2* 10.7* 10.8* 11.0* 10.7*  MG  --  2.2 2.3 2.5* 2.6*  PHOS  --  3.9 4.0 4.9* 4.2    GFR: Estimated Creatinine Clearance: 27.6 mL/min (A) (by C-G formula based on SCr of 2.46 mg/dL (H)). Liver Function Tests: Recent Labs  Lab 06/05/21 0421 06/08/21 0240 06/09/21 0142 06/10/21 0313 06/11/21 0503  AST 23 21 33 36 35  ALT 11 12 15 14 10   ALKPHOS 31* 31* 31* 34* 32*  BILITOT 0.5 0.6 0.5 0.6 0.8  PROT 6.2* 5.4* 5.8* 5.8* 5.7*  ALBUMIN 2.7* 2.4* 2.4* 2.4* 2.3*    No results for input(s): LIPASE, AMYLASE in the last 168 hours. No results for input(s): AMMONIA in the last 168 hours. Coagulation Profile: Recent Labs  Lab 06/07/21 0342  INR 1.0    Cardiac Enzymes: No results for input(s): CKTOTAL, CKMB, CKMBINDEX, TROPONINI in the last  168 hours. BNP (last 3 results) Recent Labs    02/03/21 1100  PROBNP 635*    HbA1C: No results for input(s): HGBA1C in the last 72 hours. CBG: Recent Labs  Lab 06/10/21 1950 06/11/21 0003 06/11/21 0422 06/11/21 0744 06/11/21 1118  GLUCAP 133* 140* 170* 159* 161*    Lipid Profile: No results for input(s): CHOL, HDL, LDLCALC, TRIG, CHOLHDL, LDLDIRECT in the last 72 hours. Thyroid Function Tests: No results for input(s): TSH, T4TOTAL, FREET4, T3FREE, THYROIDAB in the last 72 hours. Anemia Panel: Recent Labs    06/10/21 1431  VITAMINB12 697  FOLATE 12.9  FERRITIN 198  TIBC 235*  IRON 29*  RETICCTPCT 2.6   Sepsis Labs: No results for input(s): PROCALCITON, LATICACIDVEN in the last 168 hours.   Recent Results (from the past 240 hour(s))  Fungus culture, blood     Status: None (Preliminary result)   Collection Time: 06/05/21  1:43 PM   Specimen: BLOOD RIGHT HAND  Result Value Ref Range Status   Specimen Description BLOOD RIGHT HAND  Final   Special Requests   Final    BOTTLES DRAWN AEROBIC ONLY Blood Culture results may not be optimal due to an inadequate volume of blood received in culture bottles   Culture    Final    NO GROWTH 5 DAYS Performed at [facility], [place] [address] [place], [place] [address]    Report Status PENDING  Incomplete  Culture, blood (single)     Status: None (Preliminary result)   Collection Time: 06/06/21  7:33 AM   Specimen: BLOOD  Result Value Ref Range Status   Specimen Description BLOOD RIGHT ANTECUBITAL  Final   Special Requests   Final    BOTTLES DRAWN AEROBIC AND ANAEROBIC Blood Culture adequate volume   Culture   Final    NO GROWTH 4 DAYS Performed at [facility], [place] [address], [place], [place] [address]    Report Status PENDING  Incomplete      RN Pressure Injury Documentation:     Estimated body mass index is 22.74 kg/m as calculated from the following:   Height as of this encounter: 5\' 10"  (1.778 m).   Weight as of this encounter: 71.9 kg.  Malnutrition Type:  Nutrition Problem: Severe Malnutrition Etiology: chronic illness (Alzheimers dementia, DM, CKD, CHF)   Malnutrition Characteristics:  Signs/Symptoms: moderate fat depletion, severe fat depletion, moderate muscle depletion, severe muscle depletion, percent weight loss Percent weight loss: 23 %   Nutrition Interventions:  Interventions: Tube feeding, MVI, Prostat   Radiology Studies: IR GASTROSTOMY TUBE MOD SED  Result Date: 06/09/2021 INDICATION: 70 year old male referred for gastrostomy EXAM: PERC PLACEMENT GASTROSTOMY MEDICATIONS: 2 g Ancef; Antibiotics were administered within 1 hour of the procedure. Scratch ANESTHESIA/SEDATION: Versed 1.0 mg IV; Fentanyl 25 mcg IV Moderate Sedation Time:  15 minutes The patient was continuously monitored during the procedure by the interventional radiology nurse under my direct supervision. CONTRAST:  63mL OMNIPAQUE IOHEXOL 300 MG/ML SOLN - administered into the gastric lumen. FLUOROSCOPY TIME:  Fluoroscopy Time: 6 minutes 18 seconds (16 mGy). COMPLICATIONS: 15 cc PROCEDURE: Informed written consent was obtained from the patient  and the patient's family after a thorough discussion of the procedural risks, benefits and alternatives. All questions were addressed. Maximal Sterile Barrier Technique was utilized including caps, mask, sterile gowns, sterile gloves, sterile drape, hand hygiene and skin antiseptic. A timeout was performed prior to the initiation of the procedure. The epigastrium was prepped with Betadine  in a sterile fashion, and a sterile drape was applied covering the operative field. A sterile gown and sterile gloves were used for the procedure. A 5-French orogastric tube is placed under fluoroscopic guidance. Scout imaging of the abdomen confirms barium within the transverse colon. The stomach was distended with gas. Under fluoroscopic guidance, an 18 gauge needle was utilized to puncture the anterior wall of the body of the stomach. The puncture was made just superficial to a site ventral hernia, at a site where we perceived the most residual muscular tone persisted. An Amplatz wire was advanced through the needle passing a T fastener into the lumen of the stomach. The T fastener was secured for gastropexy. A 9-French sheath was inserted. A snare was advanced through the 9-French sheath. A Britta Mccreedy was advanced through the orogastric tube. It was snared then pulled out the oral cavity, pulling the snare, as well. The leading edge of the gastrostomy was attached to the snare. It was then pulled down the esophagus and out the percutaneous site. Tube secured in place. Contrast was injected. Patient tolerated the procedure well and remained hemodynamically stable throughout. No complications were encountered and no significant blood loss encountered. IMPRESSION: Status post fluoroscopic placed percutaneous gastrostomy tube, with 20 [place] pull-through. Signed, Dulcy Fanny. Earleen Newport, DO [facility] Electronically Signed   By: Corrie Mckusick D.O.   On: 06/09/2021 17:25    Scheduled  Meds:  carvedilol  25 mg Per NG tube BID WC   chlorhexidine  15 mL Mouth Rinse BID   Chlorhexidine Gluconate Cloth  6 each Topical Q0600   feeding supplement (PROSource TF)  45 mL Per Tube BID   free water  400 mL Per Tube Q6H   heparin injection (subcutaneous)  5,000 Units Subcutaneous Q8H   hydrALAZINE  100 mg Per Tube TID   iron polysaccharides  150 mg Per Tube Daily   mouth rinse  15 mL Mouth Rinse q12n4p   multivitamin with minerals  1 tablet Per Tube Daily   neomycin-bacitracin-polymyxin  1 application Topical Daily   pantoprazole sodium  40 mg Per Tube QHS   senna-docusate  1 tablet Per Tube BID   sodium chloride flush  3 mL Intravenous Once   Continuous Infusions:  dextrose 75 mL/hr at 06/11/21 0416   feeding supplement (OSMOLITE 1.5 CAL) 1,000 mL (06/10/21 1327)    LOS: 14 days   Kerney Elbe, DO [facility] PAGER is on AMION  If 7PM-7AM, please contact night-coverage [email or web address]

## 2021-06-11 NOTE — Plan of Care (Signed)
  Problem: Nutrition: Goal: Risk of aspiration will decrease Outcome: Progressing Goal: Dietary intake will improve Outcome: Progressing   Problem: Clinical Measurements: Goal: Respiratory complications will improve Outcome: Progressing Goal: Cardiovascular complication will be avoided Outcome: Progressing   Problem: Activity: Goal: Risk for activity intolerance will decrease Outcome: Progressing

## 2021-06-12 LAB — CBC WITH DIFFERENTIAL/PLATELET
Abs Immature Granulocytes: 0.06 10*3/uL (ref 0.00–0.07)
Basophils Absolute: 0 10*3/uL (ref 0.0–0.1)
Basophils Relative: 0 %
Eosinophils Absolute: 0.1 10*3/uL (ref 0.0–0.5)
Eosinophils Relative: 1 %
HCT: 26.1 % — ABNORMAL LOW (ref 39.0–52.0)
Hemoglobin: 8.3 g/dL — ABNORMAL LOW (ref 13.0–17.0)
Immature Granulocytes: 1 %
Lymphocytes Relative: 3 %
Lymphs Abs: 0.3 10*3/uL — ABNORMAL LOW (ref 0.7–4.0)
MCH: 31.8 pg (ref 26.0–34.0)
MCHC: 31.8 g/dL (ref 30.0–36.0)
MCV: 100 fL (ref 80.0–100.0)
Monocytes Absolute: 0.4 10*3/uL (ref 0.1–1.0)
Monocytes Relative: 5 %
Neutro Abs: 6.9 10*3/uL (ref 1.7–7.7)
Neutrophils Relative %: 90 %
Platelets: 214 10*3/uL (ref 150–400)
RBC: 2.61 MIL/uL — ABNORMAL LOW (ref 4.22–5.81)
RDW: 13.7 % (ref 11.5–15.5)
WBC: 7.7 10*3/uL (ref 4.0–10.5)
nRBC: 0 % (ref 0.0–0.2)

## 2021-06-12 LAB — PHOSPHORUS: Phosphorus: 3.8 mg/dL (ref 2.5–4.6)

## 2021-06-12 LAB — COMPREHENSIVE METABOLIC PANEL
ALT: 13 U/L (ref 0–44)
AST: 46 U/L — ABNORMAL HIGH (ref 15–41)
Albumin: 2.3 g/dL — ABNORMAL LOW (ref 3.5–5.0)
Alkaline Phosphatase: 34 U/L — ABNORMAL LOW (ref 38–126)
Anion gap: 7 (ref 5–15)
BUN: 87 mg/dL — ABNORMAL HIGH (ref 8–23)
CO2: 24 mmol/L (ref 22–32)
Calcium: 10.7 mg/dL — ABNORMAL HIGH (ref 8.9–10.3)
Chloride: 109 mmol/L (ref 98–111)
Creatinine, Ser: 2.3 mg/dL — ABNORMAL HIGH (ref 0.61–1.24)
GFR, Estimated: 29 mL/min — ABNORMAL LOW (ref >60)
Glucose, Bld: 142 mg/dL — ABNORMAL HIGH (ref 70–99)
Potassium: 3.6 mmol/L (ref 3.5–5.1)
Sodium: 140 mmol/L (ref 135–145)
Total Bilirubin: 0.4 mg/dL (ref 0.3–1.2)
Total Protein: 5.8 g/dL — ABNORMAL LOW (ref 6.5–8.1)

## 2021-06-12 LAB — FUNGUS CULTURE, BLOOD: Culture: NO GROWTH

## 2021-06-12 LAB — GLUCOSE, CAPILLARY
Glucose-Capillary: 123 mg/dL — ABNORMAL HIGH (ref 70–99)
Glucose-Capillary: 131 mg/dL — ABNORMAL HIGH (ref 70–99)
Glucose-Capillary: 133 mg/dL — ABNORMAL HIGH (ref 70–99)
Glucose-Capillary: 137 mg/dL — ABNORMAL HIGH (ref 70–99)
Glucose-Capillary: 142 mg/dL — ABNORMAL HIGH (ref 70–99)
Glucose-Capillary: 144 mg/dL — ABNORMAL HIGH (ref 70–99)
Glucose-Capillary: 149 mg/dL — ABNORMAL HIGH (ref 70–99)

## 2021-06-12 LAB — MAGNESIUM: Magnesium: 2.4 mg/dL (ref 1.7–2.4)

## 2021-06-12 NOTE — Progress Notes (Signed)
  Speech Language Pathology Treatment: Dysphagia  Patient Details Name: Casey Greene MRN: [id number] DOB: 12-09-1950 Today's Date: 06/12/2021 Time: 3785-8850 SLP Time Calculation (min) (ACUTE ONLY): 18 min  Assessment / Plan / Recommendation Clinical Impression  Followed up for attempt at PO trials however significant pt lethargy persists. Unable to adequately arouse pt despite cueing and diligent oral care. Per RN mentation has remained heavily somnolent (pt not on sedating meds). This SLP did experience past moments of good alertness (7/14 am, but 7/14 pm mentation was noted to be poor). Pt is s/p PEG placement 7/15. Contacted pts spouse, [name], per RN request for update regarding swallowing and cognitive updates since acute admission. Reviewed findings with spouse on recent MBSS and limitations for pt progress; current barrier is pt mentation. Spouse questioned pts recent ability to communicate; upon further detail gathering from spouse, suspect pts level of consciousness has also been main barrier to communication ability. SLP to continue to closely monitor for ability to participate in PO swallowing therapy or meaningful cognitive intervention.       HPI HPI: 70 yr old admitted with LUE and LLE weakness. CT revealed acute right thalamic hemorrhage. 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), DM, mild neurocognitive disorder d/t multiple etiologies, prostate cancer, pontine/bil thalamic infarcts 12/26/20      SLP Plan  Continue with current plan of care       Recommendations  Diet recommendations: NPO Medication Administration: Via alternative means                Oral Care Recommendations: Oral care BID Follow up Recommendations: Skilled Nursing facility;24 hour supervision/assistance SLP Visit Diagnosis: Dysphagia, oropharyngeal phase (R13.12) Plan: Continue with current plan of care       GO                Hayden Rasmussen MA, CCC-SLP Acute Rehabilitation  Services   06/12/2021, 3:24 PM

## 2021-06-12 NOTE — Plan of Care (Signed)
  Problem: Self-Care: Goal: Ability to communicate needs accurately will improve Outcome: Progressing   Problem: Intracerebral Hemorrhage Tissue Perfusion: Goal: Complications of Intracerebral Hemorrhage will be minimized Outcome: Progressing

## 2021-06-12 NOTE — Care Management Important Message (Signed)
Important Message  Patient Details  Name: Casey Greene MRN: [id number] Date of Birth: 04/22/1951   Medicare Important Message Given:  Yes     Shelda Altes 06/12/2021, 9:58 AM

## 2021-06-12 NOTE — Progress Notes (Signed)
Physical Therapy Treatment Patient Details Name: Casey Greene MRN: [id number] DOB: 27-Jul-1951 Today's Date: 06/12/2021    History of Present Illness 50 male admitted to [facility] on 7/3 with R thalamic hemorrhage with intraventricular extension and hydrocephalus, no plan for neurosurgical intervention. PMH includes CKD stage III, hyperlipidemia, essential hypertension, LVH, mild neurocognitive disorder on Aricept, peripheral vascular disease, chronic diastolic heart failure, ACD, type 2 diabetes mellitus, and remote strokes identified on MRI in January 2022- remote pontine and bilateral thalamic infarcts and subacute infarcts identified on imaging in June 2022 on aspirin and clopidogrel.    PT Comments    Pt remained asleep with entire session with eyes briefly opening before closing again throughout session. No other response or assist with ex's noted. Acute PT to continued during pt's hospital stay.   Follow Up Recommendations  SNF     Equipment Recommendations  Hospital bed;Wheelchair (measurements PT);Wheelchair cushion (measurements PT) (hoyer lift)    Recommendations for Other Services       Precautions / Restrictions Precautions Precautions: Fall Precaution Comments: BP <140, cortrak Restrictions Weight Bearing Restrictions: No    Mobility  Bed Mobility               General bed mobility comments: deferred all mobility due to decreased level of arousal/therapist safety with no second person assist available        Modified Rankin (Stroke Patients Only) Modified Rankin (Stroke Patients Only) Pre-Morbid Rankin Score: Slight disability Modified Rankin: Severe disability     Cognition Arousal/Alertness: Lethargic Behavior During Therapy: Flat affect Overall Cognitive Status: Difficult to assess               General Comments: pt briefly opens eyes with session, then back to closed. No other response to therapist with session.      Exercises General  Exercises - Lower Extremity Ankle Circles/Pumps: PROM;Both;10 reps;Supine Quad Sets: PROM;Both;10 reps;Supine Heel Slides: PROM;Both;10 reps;Supine Hip ABduction/ADduction: PROM;Both;10 reps;Supine Straight Leg Raises: PROM;Both;10 reps;Supine     Pertinent Vitals/Pain Faces Pain Scale: No hurt     PT Goals (current goals can now be found in the care plan section) Acute Rehab PT Goals Patient Stated Goal: did not state, lethargic PT Goal Formulation: With patient Time For Goal Achievement: 06/13/21 Potential to Achieve Goals: Fair Progress towards PT goals: Not progressing toward goals - comment (limited by lethargy today)    Frequency    Min 2X/week      PT Plan Current plan remains appropriate    AM-PAC PT "6 Clicks" Mobility   Outcome Measure  Help needed turning from your back to your side while in a flat bed without using bedrails?: Total Help needed moving from lying on your back to sitting on the side of a flat bed without using bedrails?: Total Help needed moving to and from a bed to a chair (including a wheelchair)?: Total Help needed standing up from a chair using your arms (e.g., wheelchair or bedside chair)?: Total Help needed to walk in hospital room?: Total Help needed climbing 3-5 steps with a railing? : Total 6 Click Score: 6    End of Session   Activity Tolerance: Patient limited by lethargy Patient left: in bed;with call bell/phone within reach;with bed alarm set Nurse Communication: Mobility status PT Visit Diagnosis: Muscle weakness (generalized) (M62.81);Other symptoms and signs involving the nervous system (R29.898)     Time: 1300-1310 PT Time Calculation (min) (ACUTE ONLY): 10 min  Charges:  $Therapeutic Exercise: 8-22 mins  Willow Ora, PTA, [place] [phone] 06/12/21, 1:45 PM    Willow Ora 06/12/2021, 1:44 PM

## 2021-06-12 NOTE — Plan of Care (Signed)
  Problem: Education: Goal: Knowledge of disease or condition will improve Outcome: Progressing Goal: Knowledge of secondary prevention will improve Outcome: Progressing Goal: Knowledge of patient specific risk factors addressed and post discharge goals established will improve Outcome: Progressing Goal: Individualized Educational Video(s) Outcome: Progressing   Problem: Coping: Goal: Will identify appropriate support needs Outcome: Progressing   Problem: Health Behavior/Discharge Planning: Goal: Ability to manage health-related needs will improve Outcome: Progressing   Problem: Self-Care: Goal: Ability to participate in self-care as condition permits will improve Outcome: Progressing Goal: Verbalization of feelings and concerns over difficulty with self-care will improve Outcome: Progressing Goal: Ability to communicate needs accurately will improve Outcome: Progressing   Problem: Nutrition: Goal: Risk of aspiration will decrease Outcome: Progressing Goal: Dietary intake will improve Outcome: Progressing   Problem: Intracerebral Hemorrhage Tissue Perfusion: Goal: Complications of Intracerebral Hemorrhage will be minimized Outcome: Progressing   Problem: Education: Goal: Knowledge of General Education information will improve Description: Including pain rating scale, medication(s)/side effects and non-pharmacologic comfort measures Outcome: Progressing   Problem: Health Behavior/Discharge Planning: Goal: Ability to manage health-related needs will improve Outcome: Progressing   Problem: Clinical Measurements: Goal: Ability to maintain clinical measurements within normal limits will improve Outcome: Progressing Goal: Will remain free from infection Outcome: Progressing Goal: Diagnostic test results will improve Outcome: Progressing Goal: Respiratory complications will improve Outcome: Progressing Goal: Cardiovascular complication will be avoided Outcome:  Progressing   Problem: Activity: Goal: Risk for activity intolerance will decrease Outcome: Progressing   Problem: Nutrition: Goal: Adequate nutrition will be maintained Outcome: Progressing   Problem: Coping: Goal: Level of anxiety will decrease Outcome: Progressing   Problem: Elimination: Goal: Will not experience complications related to bowel motility Outcome: Progressing Goal: Will not experience complications related to urinary retention Outcome: Progressing   Problem: Pain Managment: Goal: General experience of comfort will improve Outcome: Progressing   Problem: Safety: Goal: Ability to remain free from injury will improve Outcome: Progressing   Problem: Skin Integrity: Goal: Risk for impaired skin integrity will decrease Outcome: Progressing

## 2021-06-12 NOTE — TOC Progression Note (Signed)
Transition of Care [facility]) - Progression Note    Patient Details  Name: Casey Greene MRN: [id number] Date of Birth: 1951/06/11  Transition of Care [facility]) CM/SW [place], [place] Phone Number: 06/12/2021, 2:12 PM  Clinical Narrative:   CSW reached out to SNF that patient's wife preferred, [facility] and [facility]. [place] still reviewing referral, [facility] has declined. CSW spoke with wife to provide update, and wife provided additional SNF options. CSW faxed referral to [facility], [facility], [place], per wife's request. Awaiting responses. Patient's wife reported that she also was able to successfully initiate the Medicaid process today, and they are sending her paperwork in the mail that will include the patient's pending Medicaid number. CSW to continue to follow.    Expected Discharge Plan: [employer] Barriers to Discharge: Continued Medical Work up, Ship broker  Expected Discharge Plan and Services Expected Discharge Plan: [place] Choice: [place] Living arrangements for the past 2 months: Apartment                                       Social Determinants of Health (SDOH) Interventions    Readmission Risk Interventions Readmission Risk Prevention Plan 10/09/2020  Medication Screening Complete  Transportation Screening Complete  Some recent data might be hidden

## 2021-06-12 NOTE — Progress Notes (Signed)
Has been offered PROGRESS NOTE    Casey Greene  GEX:[id number] DOB: 12/23/1950 DOA: 05/28/2021 PCP: [name], MD   Brief Narrative:  The patient is a 70 year old African-American male with a past medical history significant for but not limited to history of CVA, mild neurocognitive dysfunction, PAD, chronic diastolic CHF, diabetes mellitus type 2, hypertension, chronic kidney disease stage IV as well as other comorbidities who presented with left-sided hemiplegia and found to have an acute right thalamic hemorrhage.  He is admitted by neurology to the neurointensive care unit and stabilized and then subsequently transferred to the hospitalist service.  Upon 7 7 he became febrile and there was suspicion for microaspiration so he started on IV Unasyn.  Given his lack of improvement and his family's wishes for aggressive care interventional radiology was consulted for possible PEG tube placement.  Palliative care goals of care happening at this time and his mental status is not improving.  He is currently on aspirin and Plavix being given.  Throughout core track tube feeding.  Given his suspicion for aspiration pneumonia and his decreased mental status with ongoing copious secretions he was initiated on breathing treatments and ID was consulted.  He has a very poor prognosis and palliative care discussing with family about possible PEG tube placement.  Continuing goals of care discussion at this time.  Patient's current significant events and significant studies are as follows: 7/3>> presented with left-sided hemiplegia-right thalamic hemorrhage on CT-admit to neuro ICU 7/7>> transfer to [facility] 7/7>> febrile-suspicion for microaspiration-Unasyn started   Significant studies: 6/12>> TTE: EF 01-02%, grade 2 diastolic dysfunction. 7/3>> acute right thalamic hemorrhage with intraventricular extension and hydrocephalus. 7/3>> CT head: Unchanged size of intraparenchymal hemorrhage 7/4>> MRI brain: 2  small acute infarct in rt parietal lobe, acute right thalamic hemorrhage with intraventricular extension. 7/4>> MRA brain: No significant stenosis or aneurysm 7/4>> MRA neck: No significant stenosis 7/4>> LDL: 50 7/4>> A1c: 5.1 7/7>> CXR: No pneumonia 7/8>> CXR: No pneumonia 7/9>> CT chest: Some scattered secretions in the central trachea/right mainstem bronchus-lungs are clear. 7/9>> CT abdomen: Diffusely fluid-filled appearance of the colon with lack of formed stools, complex multilobulated ventral hernia.  7/12>> CT head: Decreased right thalamic/intraventricular hemorrhage since last week 7/12>> CXR: No pneumonia 06/06/2021 patient had an EEG done which was suggestive of moderate diffuse encephalopathy with nonspecific etiology but there is no seizures or epileptiform discharges seen throughout the recording 06/07/21 patient remains lethargic 06/09/21 Mental Status is improving a little but still lethargic and PEG Tube placed 06/10/21 TF started through PEG   Assessment & Plan:   Active Problems:   ICH (intracerebral hemorrhage) (HCC)   Protein-calorie malnutrition, severe  Right thalamic intracranial hemorrhage with intraventricular extension and hydrocephalus: -Neurological deficits remain unchanged-left-sided hemiplegia/dysphagia persists -Neurology recommending to keep SBP less than 160-bit more lethargic today (repeat CT head stable/ABG without hypercarbia).   -Continue supportive care-continues to have dysphagia and accumulates secretion-family desires full scope of treatment (see palliative care note)-have consulted IR for PEG tube placement but after goals of care discussion the family with palliative they are currently-hold to reevaluate and assess -Will conider resuming Robinul for secretions -Continues to be hard to arouse (CThead/ABG done earlier this am)-coughing-grimaces to pain-per RN opens eyes at time. Dr. Sonia Side Spoke with Stroke MD-Dr Erlinda Hong who evaluated and recommended MRI  Brain and EEG. -MRI Brain showed "Slightly decreased size of right thalamic intraparenchymal hematoma with  intraventricular extension. Numerous chronic microhemorrhages in a predominantly central distribution, consistent with chronic  hypertensive angiopathy. Chronic ischemic microangiopathy" -EEG showed "This study is suggestive of moderate diffuse encephalopathy, nonspecific etiology. No seizures or epileptiform discharges were seen throughout the recording."  -Given issues with lethargy-stop both clonidine and Robinul; He was lethargic this AM    Dysphagia:  -Continues to have significant dysphagia-have consulted IR for PEG tube placement  and Palliative discussing with family and family weighing risks vs. Benefits of PEG Placement  -Family wants PEG and all other life-prolonging measures per palliative discussion but patient is now DNR/DNI -PEG Placed 06/09/21 and TF started via the PEG   Fever-suspicion for microaspiration:  Acute respiratory failure with hypoxia -High suspicion for microaspiration-accumulating secretions-although no parenchymal disease seen on imaging studies-has secretions/mucus in his airways (seen on CT chest).   -Doubt UTI-UA benign-even though he has some bilateral perinephric standing on CT.   -Suspect that this is either microaspiration-or central fever.  Appreciate ID input-recommendations are for total of 7 days of Unasyn.  IV Unasyn now stopped  -We will add Xopenex and Atrovent -ID recommending follow up on repeat Blood Cx on 06/06/21 and following Lower extremity Duplex; blood cultures from 06/06/2021 showed no growth to date 4 days and lower extremity duplex showed no evidence of any DVT -Aspiration Precautions   Episode of hematochezia on 7/9:  -CBC stable-none since 7/9  -plan is to watch closely for recurrence.   -Hemoglobin relatively stable last few days however dropped to the Low 7's -Checked Anemia Panel and showed an iron level of 29, he has a CO2 of 6,  TIBC of 235, saturation ratios of 12%, ferritin level of 198, folate level 12.9, vitamin B12 697 -We will start Niferex 150 mg p.o. daily through PEG tube -Patient's hemoglobin/hematocrit has been relatively stable and gone from 8.5/26.9 -> 9.1/28.5 -> 8.9/27.8 -> 7.3/22.8 -> 7.5/23.5 -> 7.3/23.1 -> 7.3/22.3 -> 8.3/26.1; if necessary will transfuse 1 unit PRBCs -Continue to Monitor for S/Sx of Bleeding -Repeat CBC in the AM  HyperNatremia/Hyperchloremia -Improving Na+ is now 140 and now Chloride Level is 109; C/w Free Water Flushes -Continued IV fluid hydration from normal saline to D5W.  Now sodium level is now 147 and increased Rate of D5W to 75 mL/hr -Continue monitor and trend and repeat CMP in a.m.   HTN: -BP creeping up-goal SBP less than 160-added clonidine but has been held as above -continue Coreg/hydralazine.  Losartan on hold due to fluctuating renal function. -Continue to Monitor BP per Protocol -Last BP was 123/70   HLD -On statins prior to this hospital stay-resume when oral intake established.   HFpEF -Euvolemic on exam -Strict I's and O's and Daily Weights -Continue to Monitor Volume Status given that he is on IVF; Patient is -9.564 Liters    AKI CKD stage IV Hyperphosphatemia  -AKI improving slowly after starting normal saline but will change to D5W.   -Continue to hold losartan.   -Phos Level was 4.9 -BUN/Cr went from 83/2.98 -> 81/2.72 -> 83/2.76 -> 90/2.85 -> 94/2.71 -> 87/2.58 -> 90/2.46 -> 87/2.30 -No signs of urinary retention-check frequent bladder scans.   History of mild neurocognitive dysfunction -Resume Aricept when able.   Normocytic anemia:  -Due to CKD-no indication for transfusion at this time -follow CBC daily -Hemoglobin relatively stable last few days however dropped to the Low 7's -Check Anemia Panel today -Patient's hemoglobin/hematocrit has been relatively stable and gone from 8.5/26.9 -> 9.1/28.5 -> 8.9/27.8 -> 7.3/22.8 -> 7.5/23.5 ->  7.3/23.1 -> 8.3/26.1; if necessary will transfuse 1 unit PRBCs -  Repeat CBC in the AM  Palliative care: Currently now DNR/DNI -Appreciate palliative care input-patient/family desires full scope of treatment and PEG and all life prolonging measures. Dr. Sloan Leiter has had repeated discussions with spouse-she is aware of the issue with aspiration pneumonia-and placing a PEG tube will not minimize risk of aspiration.   -Given ongoing lethargy-aspiration issues/accumulation of secretions-with lack of any significant improvement-he remains with poor prognosis-and at risk for further decompensation.  He Spoke at length with spouse on 7/12 and relayed concerns againhave implored her to reexamine goals of care -Palliative Care reconsulted for [place] and continuing current Plan of Care and recommending starting with PEG feedings and recommending discharging to SNF in next day or 2 pending bed availability   Severe malnutrition in the context of chronic illness -Evidenced by moderate fat depletion, severe fat depletion and 23% weight loss -Nutritionist consulted and currently on tube feedings with a multivitamin and prostat -Estimated body mass index is 22.71 kg/m as calculated from the following:   Height as of this encounter: 5\' 10"  (1.778 m).   Weight as of this encounter: 71.8 kg. -SLP evaluated and did MBS but patient failed miserably so will need PEG tube placement at this time given that he has oral deficits and moderate pharyngeal deficits. -We will pursue PEG tube placement and IR consulted and likely can be done today given that his Fever Curve improved over the last 24-48 hours  -PEG Placed and tolerating feeds   DVT prophylaxis: Heparin 5,000 sq q8h Code Status: FULL CODE  Family Communication: No family present at bedside  Disposition Plan: SNF in the next few days when bed is available.   Status is: Inpatient  Remains inpatient appropriate because:Unsafe d/c plan, IV treatments appropriate  due to intensity of illness or inability to take PO, and Inpatient level of care appropriate due to severity of illness  Dispo: The patient is from: Home              Anticipated d/c is to:  TBD              Patient currently is not medically stable to d/c.   Difficult to place patient No  Consultants:  Palliative Care Neurology  PCCM   Procedures: As above  Antimicrobials:  Anti-infectives (From admission, onward)    Start     Dose/Rate Route Frequency Ordered Stop   06/09/21 1600  ceFAZolin (ANCEF) IVPB 2g/100 mL premix        2 g 200 mL/hr over 30 Minutes Intravenous STAT 06/09/21 1500 06/09/21 1556   06/09/21 1545  ceFAZolin (ANCEF) IVPB 1 g/50 mL premix  Status:  Discontinued        1 g 100 mL/hr over 30 Minutes Intravenous STAT 06/09/21 1457 06/09/21 1500   06/09/21 0900  ceFAZolin (ANCEF) IVPB 2g/100 mL premix        2 g 200 mL/hr over 30 Minutes Intravenous On call 06/09/21 0814 06/10/21 0900   06/01/21 1500  Ampicillin-Sulbactam (UNASYN) 3 g in sodium chloride 0.9 % 100 mL IVPB        3 g 200 mL/hr over 30 Minutes Intravenous Every 12 hours 06/01/21 1402 06/08/21 0405       Subjective: Seen and examined at bedside and was more lethargic today compared to yesterday.  Yesterday he is much more interactive and awake.  Currently in no acute distress.  Awaiting SNF placement now that his PEG tube is in place and is getting tube feeds.  Wife  did decide on placement.  No other concerns or complaints at this time.   Objective: Vitals:   06/12/21 0500 06/12/21 0745 06/12/21 1255 06/12/21 1345  BP:  127/84 106/74 123/70  Pulse:  71 70 81  Resp:  18 13 14   Temp:  97.7 F (36.5 C) 98.7 F (37.1 C)   TempSrc:  Oral Axillary   SpO2:  100% 100% 100%  Weight: 72.5 kg     Height:        Intake/Output Summary (Last 24 hours) at 06/12/2021 1432 Last data filed at 06/12/2021 1200 Gross per 24 hour  Intake 2562.32 ml  Output 1900 ml  Net 662.32 ml    Filed Weights    06/10/21 0410 06/11/21 0446 06/12/21 0500  Weight: 71.6 kg 71.9 kg 72.5 kg   Examination: Physical Exam:  Constitutional: Chronically ill-appearing elderly African-American male in no acute distress but is much more lethargic today and not as interactive as yesterday Eyes: Lids closed  ENMT: External Ears, Nose appear normal. Mouth open with some secretions  Neck: Appears normal, supple, no cervical masses, normal ROM, no appreciable thyromegaly; no JVD Respiratory: Diminished to auscultation bilaterally, no wheezing, rales, rhonchi or crackles. Normal respiratory effort and patient is not tachypenic. No accessory muscle use. Unlabored breathing  Cardiovascular: RRR, no murmurs / rubs / gallops. S1 and S2 auscultated. No appreciable LE edema Abdomen: Soft, non-tender, non-distended. PEG in place and Bowel sounds positive.  GU: Deferred. Musculoskeletal: No clubbing / cyanosis of digits/nails. No joint deformity upper and lower extremities.  Skin: No rashes, lesions, ulcers on a limited skin evaluation. No induration; Warm and dry.  Neurologic: Not awake and does not not follow commands Psychiatric: Impaired judgment and insight. He is not alert and awake and oriented x 3. Normal mood and appropriate affect.   Data Reviewed: I have personally reviewed following labs and imaging studies  CBC: Recent Labs  Lab 06/08/21 0240 06/09/21 0142 06/10/21 0313 06/11/21 0503 06/12/21 0117  WBC 8.3 8.5 9.4 7.8 7.7  NEUTROABS 7.2 7.4 8.7* 6.9 6.9  HGB 7.3* 7.5* 7.3* 7.3* 8.3*  HCT 22.8* 23.5* 23.1* 22.3* 26.1*  MCV 99.1 100.0 99.1 99.1 100.0  PLT 177 204 220 193 174    Basic Metabolic Panel: Recent Labs  Lab 06/08/21 0240 06/09/21 0142 06/10/21 0313 06/11/21 0503 06/12/21 0117  NA 144 143 147* 141 140  K 3.6 3.9 3.7 3.6 3.6  CL 113* 113* 116* 112* 109  CO2 25 24 24 23 24   GLUCOSE 149* 121* 125* 149* 142*  BUN 90* 94* 87* 90* 87*  CREATININE 2.85* 2.71* 2.58* 2.46* 2.30*  CALCIUM  10.7* 10.8* 11.0* 10.7* 10.7*  MG 2.2 2.3 2.5* 2.6* 2.4  PHOS 3.9 4.0 4.9* 4.2 3.8    GFR: Estimated Creatinine Clearance: 29.8 mL/min (A) (by C-G formula based on SCr of 2.3 mg/dL (H)). Liver Function Tests: Recent Labs  Lab 06/08/21 0240 06/09/21 0142 06/10/21 0313 06/11/21 0503 06/12/21 0117  AST 21 33 36 35 46*  ALT 12 15 14 10 13   ALKPHOS 31* 31* 34* 32* 34*  BILITOT 0.6 0.5 0.6 0.8 0.4  PROT 5.4* 5.8* 5.8* 5.7* 5.8*  ALBUMIN 2.4* 2.4* 2.4* 2.3* 2.3*    No results for input(s): LIPASE, AMYLASE in the last 168 hours. No results for input(s): AMMONIA in the last 168 hours. Coagulation Profile: Recent Labs  Lab 06/07/21 0342  INR 1.0    Cardiac Enzymes: No results for input(s): CKTOTAL, CKMB, CKMBINDEX, TROPONINI  in the last 168 hours. BNP (last 3 results) Recent Labs    02/03/21 1100  PROBNP 635*    HbA1C: No results for input(s): HGBA1C in the last 72 hours. CBG: Recent Labs  Lab 06/11/21 2150 06/12/21 0046 06/12/21 0316 06/12/21 0749 06/12/21 1150  GLUCAP 136* 131* 142* 133* 149*    Lipid Profile: No results for input(s): CHOL, HDL, LDLCALC, TRIG, CHOLHDL, LDLDIRECT in the last 72 hours. Thyroid Function Tests: No results for input(s): TSH, T4TOTAL, FREET4, T3FREE, THYROIDAB in the last 72 hours. Anemia Panel: Recent Labs    06/10/21 1431  VITAMINB12 697  FOLATE 12.9  FERRITIN 198  TIBC 235*  IRON 29*  RETICCTPCT 2.6    Sepsis Labs: No results for input(s): PROCALCITON, LATICACIDVEN in the last 168 hours.   Recent Results (from the past 240 hour(s))  Fungus culture, blood     Status: None   Collection Time: 06/05/21  1:43 PM   Specimen: BLOOD RIGHT HAND  Result Value Ref Range Status   Specimen Description BLOOD RIGHT HAND  Final   Special Requests   Final    BOTTLES DRAWN AEROBIC ONLY Blood Culture results may not be optimal due to an inadequate volume of blood received in culture bottles   Culture   Final    NO GROWTH 7 DAYS NO  FUNGUS ISOLATED Performed at [facility], [place] [address] [place], [place] [address]    Report Status 06/12/2021 FINAL  Final  Culture, blood (single)     Status: None   Collection Time: 06/06/21  7:33 AM   Specimen: BLOOD  Result Value Ref Range Status   Specimen Description BLOOD RIGHT ANTECUBITAL  Final   Special Requests   Final    BOTTLES DRAWN AEROBIC AND ANAEROBIC Blood Culture adequate volume   Culture   Final    NO GROWTH 5 DAYS Performed at [facility], [place] [address], [place], [place] [address]    Report Status 06/11/2021 FINAL  Final      RN Pressure Injury Documentation:     Estimated body mass index is 22.93 kg/m as calculated from the following:   Height as of this encounter: 5\' 10"  (1.778 m).   Weight as of this encounter: 72.5 kg.  Malnutrition Type:  Nutrition Problem: Severe Malnutrition Etiology: chronic illness (Alzheimers dementia, DM, CKD, CHF)   Malnutrition Characteristics:  Signs/Symptoms: moderate fat depletion, severe fat depletion, moderate muscle depletion, severe muscle depletion, percent weight loss Percent weight loss: 23 %   Nutrition Interventions:  Interventions: Tube feeding, MVI, Prostat   Radiology Studies: No results found.  Scheduled Meds:  carvedilol  25 mg Per NG tube BID WC   chlorhexidine  15 mL Mouth Rinse BID   Chlorhexidine Gluconate Cloth  6 each Topical Q0600   feeding supplement (PROSource TF)  45 mL Per Tube BID   free water  400 mL Per Tube Q6H   heparin injection (subcutaneous)  5,000 Units Subcutaneous Q8H   hydrALAZINE  100 mg Per Tube TID   iron polysaccharides  150 mg Per Tube Daily   mouth rinse  15 mL Mouth Rinse q12n4p   multivitamin with minerals  1 tablet Per Tube Daily   neomycin-bacitracin-polymyxin  1 application Topical Daily   pantoprazole sodium  40 mg Per Tube QHS   senna-docusate  1 tablet Per Tube BID   sodium chloride flush  3 mL Intravenous Once   Continuous  Infusions:  dextrose 75 mL/hr at  06/12/21 0558   feeding supplement (OSMOLITE 1.5 CAL) 1,000 mL (06/10/21 1327)    LOS: 15 days   Kerney Elbe, DO [facility] PAGER is on [place]  If 7PM-7AM, please contact night-coverage [email or web address]

## 2021-06-13 LAB — CBC WITH DIFFERENTIAL/PLATELET
Abs Immature Granulocytes: 0.03 10*3/uL (ref 0.00–0.07)
Basophils Absolute: 0 10*3/uL (ref 0.0–0.1)
Basophils Relative: 0 %
Eosinophils Absolute: 0.1 10*3/uL (ref 0.0–0.5)
Eosinophils Relative: 1 %
HCT: 22.3 % — ABNORMAL LOW (ref 39.0–52.0)
Hemoglobin: 7.2 g/dL — ABNORMAL LOW (ref 13.0–17.0)
Immature Granulocytes: 1 %
Lymphocytes Relative: 5 %
Lymphs Abs: 0.3 10*3/uL — ABNORMAL LOW (ref 0.7–4.0)
MCH: 31.4 pg (ref 26.0–34.0)
MCHC: 32.3 g/dL (ref 30.0–36.0)
MCV: 97.4 fL (ref 80.0–100.0)
Monocytes Absolute: 0.4 10*3/uL (ref 0.1–1.0)
Monocytes Relative: 7 %
Neutro Abs: 5.5 10*3/uL (ref 1.7–7.7)
Neutrophils Relative %: 86 %
Platelets: 252 10*3/uL (ref 150–400)
RBC: 2.29 MIL/uL — ABNORMAL LOW (ref 4.22–5.81)
RDW: 13.8 % (ref 11.5–15.5)
WBC: 6.3 10*3/uL (ref 4.0–10.5)
nRBC: 0 % (ref 0.0–0.2)

## 2021-06-13 LAB — COMPREHENSIVE METABOLIC PANEL
ALT: 12 U/L (ref 0–44)
AST: 38 U/L (ref 15–41)
Albumin: 2.2 g/dL — ABNORMAL LOW (ref 3.5–5.0)
Alkaline Phosphatase: 33 U/L — ABNORMAL LOW (ref 38–126)
Anion gap: 6 (ref 5–15)
BUN: 80 mg/dL — ABNORMAL HIGH (ref 8–23)
CO2: 24 mmol/L (ref 22–32)
Calcium: 10.1 mg/dL (ref 8.9–10.3)
Chloride: 103 mmol/L (ref 98–111)
Creatinine, Ser: 2.14 mg/dL — ABNORMAL HIGH (ref 0.61–1.24)
GFR, Estimated: 32 mL/min — ABNORMAL LOW (ref >60)
Glucose, Bld: 118 mg/dL — ABNORMAL HIGH (ref 70–99)
Potassium: 3.9 mmol/L (ref 3.5–5.1)
Sodium: 133 mmol/L — ABNORMAL LOW (ref 135–145)
Total Bilirubin: 0.4 mg/dL (ref 0.3–1.2)
Total Protein: 5.2 g/dL — ABNORMAL LOW (ref 6.5–8.1)

## 2021-06-13 LAB — GLUCOSE, CAPILLARY
Glucose-Capillary: 130 mg/dL — ABNORMAL HIGH (ref 70–99)
Glucose-Capillary: 139 mg/dL — ABNORMAL HIGH (ref 70–99)
Glucose-Capillary: 122 mg/dL — ABNORMAL HIGH (ref 70–99)
Glucose-Capillary: 130 mg/dL — ABNORMAL HIGH (ref 70–99)
Glucose-Capillary: 140 mg/dL — ABNORMAL HIGH (ref 70–99)

## 2021-06-13 LAB — PHOSPHORUS: Phosphorus: 3.1 mg/dL (ref 2.5–4.6)

## 2021-06-13 LAB — MAGNESIUM: Magnesium: 2.2 mg/dL (ref 1.7–2.4)

## 2021-06-13 NOTE — Progress Notes (Signed)
Has been offered PROGRESS NOTE    Casey Greene  WIO:[id number] DOB: 08/24/1951 DOA: 05/28/2021 PCP: [name], MD   Brief Narrative:  The patient is a 70 year old African-American male with a past medical history significant for but not limited to history of CVA, mild neurocognitive dysfunction, PAD, chronic diastolic CHF, diabetes mellitus type 2, hypertension, chronic kidney disease stage IV as well as other comorbidities who presented with left-sided hemiplegia and found to have an acute right thalamic hemorrhage.  He is admitted by neurology to the neurointensive care unit and stabilized and then subsequently transferred to the hospitalist service.  Upon 7 7 he became febrile and there was suspicion for microaspiration so he started on IV Unasyn.  Given his lack of improvement and his family's wishes for aggressive care interventional radiology was consulted for possible PEG tube placement.  Palliative care goals of care happening at this time and his mental status is not improving.  He is currently on aspirin and Plavix being given.  Throughout core track tube feeding.  Given his suspicion for aspiration pneumonia and his decreased mental status with ongoing copious secretions he was initiated on breathing treatments and ID was consulted.  He has a very poor prognosis and palliative care discussing with family about possible PEG tube placement.  Continuing goals of care discussion at this time.  Patient's current significant events and significant studies are as follows: 7/3>> presented with left-sided hemiplegia-right thalamic hemorrhage on CT-admit to neuro ICU 7/7>> transfer to [facility] 7/7>> febrile-suspicion for microaspiration-Unasyn started   Significant studies: 6/12>> TTE: EF 42-68%, grade 2 diastolic dysfunction. 7/3>> acute right thalamic hemorrhage with intraventricular extension and hydrocephalus. 7/3>> CT head: Unchanged size of intraparenchymal hemorrhage 7/4>> MRI brain: 2  small acute infarct in rt parietal lobe, acute right thalamic hemorrhage with intraventricular extension. 7/4>> MRA brain: No significant stenosis or aneurysm 7/4>> MRA neck: No significant stenosis 7/4>> LDL: 50 7/4>> A1c: 5.1 7/7>> CXR: No pneumonia 7/8>> CXR: No pneumonia 7/9>> CT chest: Some scattered secretions in the central trachea/right mainstem bronchus-lungs are clear. 7/9>> CT abdomen: Diffusely fluid-filled appearance of the colon with lack of formed stools, complex multilobulated ventral hernia.  7/12>> CT head: Decreased right thalamic/intraventricular hemorrhage since last week 7/12>> CXR: No pneumonia 06/06/2021 patient had an EEG done which was suggestive of moderate diffuse encephalopathy with nonspecific etiology but there is no seizures or epileptiform discharges seen throughout the recording 06/07/21 patient remains lethargic 06/09/21 Mental Status is improving a little but still lethargic and PEG Tube placed 06/10/21 TF started through PEG   Assessment & Plan:   Active Problems:   ICH (intracerebral hemorrhage) (HCC)   Protein-calorie malnutrition, severe  Right thalamic intracranial hemorrhage with intraventricular extension and hydrocephalus: -Neurological deficits remain unchanged-left-sided hemiplegia/dysphagia persists -Neurology recommending to keep SBP less than 160-bit more lethargic today (repeat CT head stable/ABG without hypercarbia).   -Continue supportive care-continues to have dysphagia and accumulates secretion-family desires full scope of treatment (see palliative care note)-have consulted IR for PEG tube placement but after goals of care discussion the family with palliative they are currently-hold to reevaluate and assess -Will conider resuming Robinul for secretions -Continues to wax and wane and at times difficult to arouse  -MRI Brain showed "Slightly decreased size of right thalamic intraparenchymal hematoma with  intraventricular extension. Numerous  chronic microhemorrhages in a predominantly central distribution, consistent with chronic hypertensive angiopathy. Chronic ischemic microangiopathy" -EEG showed "This study is suggestive of moderate diffuse encephalopathy, nonspecific etiology. No seizures or epileptiform  discharges were seen throughout the recording."  -Given issues with lethargy-stop both clonidine and Robinul; He was lethargic this AM    Dysphagia:  -Continues to have significant dysphagia-have consulted IR for PEG tube placement  and Palliative discussing with family and family weighing risks vs. Benefits of PEG Placement  -Family wants PEG and all other life-prolonging measures per palliative discussion but patient is now DNR/DNI -PEG Placed 06/09/21 and TF started via the PEG   Fever-suspicion for microaspiration:  Acute respiratory failure with hypoxia -High suspicion for microaspiration-accumulating secretions-although no parenchymal disease seen on imaging studies-has secretions/mucus in his airways (seen on CT chest).   -Doubt UTI-UA benign-even though he has some bilateral perinephric standing on CT.   -Suspect that this is either microaspiration-or central fever.  Appreciate ID input-recommendations are for total of 7 days of Unasyn.  IV Unasyn now stopped  -We will add Xopenex and Atrovent -ID recommending follow up on repeat Blood Cx on 06/06/21 and following Lower extremity Duplex; blood cultures from 06/06/2021 showed no growth to date 4 days and lower extremity duplex showed no evidence of any DVT -C/w Aspiration Precautions   Episode of hematochezia on 7/9:  -CBC stable-none since 7/9  -plan is to watch closely for recurrence.   -Hemoglobin relatively stable last few days however dropped to the Low 7's -Checked Anemia Panel and showed an iron level of 29, he has a CO2 of 6, TIBC of 235, saturation ratios of 12%, ferritin level of 198, folate level 12.9, vitamin B12 697 -We will start Niferex 150 mg p.o. daily  through PEG tube -Patient's hemoglobin/hematocrit has been relatively stable and gone from 8.5/26.9 -> 9.1/28.5 -> 8.9/27.8 -> 7.3/22.8 -> 7.5/23.5 -> 7.3/23.1 -> 7.3/22.3 -> 8.3/26.1 -> 7.2/22.3; if necessary will transfuse 1 unit PRBCs -Continue to Monitor for S/Sx of Bleeding -Repeat CBC in the AM  HyperNatremia/Hyperchloremia -Improving Na+ is now 140 and now Chloride Level is 109; C/w Free Water Flushes -Continued IV fluid hydration from normal saline to D5W.  Now sodium level is now 147 and increased Rate of D5W to 75 mL/hr -Continue monitor and trend and repeat CMP in a.m.   HTN: -BP creeping up-goal SBP less than 160-added clonidine but has been held as above -continue Coreg/hydralazine.  Losartan on hold due to fluctuating renal function. -Continue to Monitor BP per Protocol -Last BP was 123/70   HLD -On statins prior to this hospital stay-resume when oral intake established.   HFpEF -Euvolemic on exam -Strict I's and O's and Daily Weights -Continue to Monitor Volume Status given that he is on IVF; Patient is -9.564 Liters    AKI on CKD stage IV, improving  Hyperphosphatemia  -AKI improving slowly after starting normal saline but will change to D5W.   -Continue to hold losartan.   -Phos Level was 4.9 -BUN/Cr went from 83/2.98 -> 81/2.72 -> 83/2.76 -> 90/2.85 -> 94/2.71 -> 87/2.58 -> 90/2.46 -> 87/2.30 -> 80/2.14 -No signs of urinary retention-check frequent bladder scans.   History of mild neurocognitive dysfunction -Resume Aricept when able.   Normocytic anemia:  -Due to CKD-no indication for transfusion at this time -follow CBC daily -Hemoglobin relatively stable last few days however dropped to the Low 7's -Check Anemia Panel today -Patient's hemoglobin/hematocrit has been relatively stable and gone from 8.5/26.9 -> 9.1/28.5 -> 8.9/27.8 -> 7.3/22.8 -> 7.5/23.5 -> 7.3/23.1 -> 8.3/26.1; if necessary will transfuse 1 unit PRBCs -Repeat CBC in the AM  Palliative  care: Currently now DNR/DNI -Appreciate palliative  care input-patient/family desires full scope of treatment and PEG and all life prolonging measures. Dr. Sloan Leiter has had repeated discussions with spouse-she is aware of the issue with aspiration pneumonia-and placing a PEG tube will not minimize risk of aspiration.   -Given ongoing lethargy-aspiration issues/accumulation of secretions-with lack of any significant improvement-he remains with poor prognosis-and at risk for further decompensation.  He Spoke at length with spouse on 7/12 and relayed concerns againhave implored her to reexamine goals of care -Palliative Care reconsulted for [place] and continuing current Plan of Care and recommending starting with PEG feedings and recommending discharging to SNF in next day or 2 pending bed availability   Severe malnutrition in the context of chronic illness -Evidenced by moderate fat depletion, severe fat depletion and 23% weight loss -Nutritionist consulted and currently on tube feedings with a multivitamin and prostat -Estimated body mass index is 22.71 kg/m as calculated from the following:   Height as of this encounter: 5\' 10"  (1.778 m).   Weight as of this encounter: 71.8 kg. -SLP evaluated and did MBS but patient failed miserably so will need PEG tube placement at this time given that he has oral deficits and moderate pharyngeal deficits. -We will pursue PEG tube placement and IR consulted and likely can be done today given that his Fever Curve improved over the last 24-48 hours  -PEG Placed and tolerating feeds   DVT prophylaxis: Heparin 5,000 sq q8h Code Status: FULL CODE  Family Communication: No family present at bedside  Disposition Plan: SNF in the next few days when bed is available.   Status is: Inpatient  Remains inpatient appropriate because:Unsafe d/c plan, IV treatments appropriate due to intensity of illness or inability to take PO, and Inpatient level of care appropriate due to  severity of illness  Dispo: The patient is from: Home              Anticipated d/c is to:  TBD              Patient currently is not medically stable to d/c.   Difficult to place patient No  Consultants:  Palliative Care Neurology  PCCM   Procedures: As above  Antimicrobials:  Anti-infectives (From admission, onward)    Start     Dose/Rate Route Frequency Ordered Stop   06/09/21 1600  ceFAZolin (ANCEF) IVPB 2g/100 mL premix        2 g 200 mL/hr over 30 Minutes Intravenous STAT 06/09/21 1500 06/09/21 1556   06/09/21 1545  ceFAZolin (ANCEF) IVPB 1 g/50 mL premix  Status:  Discontinued        1 g 100 mL/hr over 30 Minutes Intravenous STAT 06/09/21 1457 06/09/21 1500   06/09/21 0900  ceFAZolin (ANCEF) IVPB 2g/100 mL premix        2 g 200 mL/hr over 30 Minutes Intravenous On call 06/09/21 0814 06/10/21 0900   06/01/21 1500  Ampicillin-Sulbactam (UNASYN) 3 g in sodium chloride 0.9 % 100 mL IVPB        3 g 200 mL/hr over 30 Minutes Intravenous Every 12 hours 06/01/21 1402 06/08/21 0405       Subjective: Seen and examined at bedside and was still lethargic but more awake today and answering questions.  Renal function is improved.  Still waiting SNF placement.  No complaints and no overnight events noted.  Patient is very slow to improve.  No other concerns or complaints this time.  Objective: Vitals:   06/13/21 0732 06/13/21 0756 06/13/21 1052  06/13/21 1208  BP: 120/78 128/77 116/67 119/80  Pulse: 71 78  71  Resp: 16   15  Temp: 98.4 F (36.9 C)   98.4 F (36.9 C)  TempSrc: Oral   Axillary  SpO2: 100%   100%  Weight:      Height:        Intake/Output Summary (Last 24 hours) at 06/13/2021 1303 Last data filed at 06/13/2021 1211 Gross per 24 hour  Intake --  Output 2200 ml  Net -2200 ml    Filed Weights   06/11/21 0446 06/12/21 0500 06/13/21 0404  Weight: 71.9 kg 72.5 kg 72.8 kg   Examination: Physical Exam:  Constitutional: Chronically ill-appearing elderly  African-American male currently in no acute distress appears lethargic still but is more interactive today and answer some questions. Eyes: Lids close again ENMT: External Ears, Nose appear normal. Grossly normal hearing.  Neck: Appears normal, supple, no cervical masses, normal ROM, no appreciable thyromegaly; no JVD Respiratory: Diminished to auscultation bilaterally, no wheezing, rales, rhonchi or crackles. Normal respiratory effort and patient is not tachypenic. No accessory muscle use.  Unlabored breathing Cardiovascular: RRR, no murmurs / rubs / gallops. S1 and S2 auscultated. No extremity edema.  Abdomen: Soft, non-tender, non-distended.  PEG in place and bowel sounds positive.  GU: Deferred. Musculoskeletal: No clubbing / cyanosis of digits/nails. No joint deformity upper and lower extremities.  Skin: No rashes, lesions, ulcers on limited skin evaluation. No induration; Warm and dry.  Neurologic: Remains lethargic and answers all questions Psychiatric: Impaired judgment and insight.  Somnolent and drowsy but answers some questions. Normal mood and appropriate affect.   Data Reviewed: I have personally reviewed following labs and imaging studies  CBC: Recent Labs  Lab 06/09/21 0142 06/10/21 0313 06/11/21 0503 06/12/21 0117 06/13/21 0420  WBC 8.5 9.4 7.8 7.7 6.3  NEUTROABS 7.4 8.7* 6.9 6.9 5.5  HGB 7.5* 7.3* 7.3* 8.3* 7.2*  HCT 23.5* 23.1* 22.3* 26.1* 22.3*  MCV 100.0 99.1 99.1 100.0 97.4  PLT 204 220 193 214 185    Basic Metabolic Panel: Recent Labs  Lab 06/09/21 0142 06/10/21 0313 06/11/21 0503 06/12/21 0117 06/13/21 0420  NA 143 147* 141 140 133*  K 3.9 3.7 3.6 3.6 3.9  CL 113* 116* 112* 109 103  CO2 24 24 23 24 24   GLUCOSE 121* 125* 149* 142* 118*  BUN 94* 87* 90* 87* 80*  CREATININE 2.71* 2.58* 2.46* 2.30* 2.14*  CALCIUM 10.8* 11.0* 10.7* 10.7* 10.1  MG 2.3 2.5* 2.6* 2.4 2.2  PHOS 4.0 4.9* 4.2 3.8 3.1    GFR: Estimated Creatinine Clearance: 32.1 mL/min  (A) (by C-G formula based on SCr of 2.14 mg/dL (H)). Liver Function Tests: Recent Labs  Lab 06/09/21 0142 06/10/21 0313 06/11/21 0503 06/12/21 0117 06/13/21 0420  AST 33 36 35 46* 38  ALT 15 14 10 13 12   ALKPHOS 31* 34* 32* 34* 33*  BILITOT 0.5 0.6 0.8 0.4 0.4  PROT 5.8* 5.8* 5.7* 5.8* 5.2*  ALBUMIN 2.4* 2.4* 2.3* 2.3* 2.2*    No results for input(s): LIPASE, AMYLASE in the last 168 hours. No results for input(s): AMMONIA in the last 168 hours. Coagulation Profile: Recent Labs  Lab 06/07/21 0342  INR 1.0    Cardiac Enzymes: No results for input(s): CKTOTAL, CKMB, CKMBINDEX, TROPONINI in the last 168 hours. BNP (last 3 results) Recent Labs    02/03/21 1100  PROBNP 635*    HbA1C: No results for input(s): HGBA1C in the last 72  hours. CBG: Recent Labs  Lab 06/12/21 2011 06/12/21 2321 06/13/21 0357 06/13/21 0737 06/13/21 1206  GLUCAP 123* 137* 130* 139* 122*    Lipid Profile: No results for input(s): CHOL, HDL, LDLCALC, TRIG, CHOLHDL, LDLDIRECT in the last 72 hours. Thyroid Function Tests: No results for input(s): TSH, T4TOTAL, FREET4, T3FREE, THYROIDAB in the last 72 hours. Anemia Panel: Recent Labs    06/10/21 1431  VITAMINB12 697  FOLATE 12.9  FERRITIN 198  TIBC 235*  IRON 29*  RETICCTPCT 2.6    Sepsis Labs: No results for input(s): PROCALCITON, LATICACIDVEN in the last 168 hours.   Recent Results (from the past 240 hour(s))  Fungus culture, blood     Status: None   Collection Time: 06/05/21  1:43 PM   Specimen: BLOOD RIGHT HAND  Result Value Ref Range Status   Specimen Description BLOOD RIGHT HAND  Final   Special Requests   Final    BOTTLES DRAWN AEROBIC ONLY Blood Culture results may not be optimal due to an inadequate volume of blood received in culture bottles   Culture   Final    NO GROWTH 7 DAYS NO FUNGUS ISOLATED Performed at [facility], [place] [address] [place], [place] [address]    Report Status 06/12/2021 FINAL  Final   Culture, blood (single)     Status: None   Collection Time: 06/06/21  7:33 AM   Specimen: BLOOD  Result Value Ref Range Status   Specimen Description BLOOD RIGHT ANTECUBITAL  Final   Special Requests   Final    BOTTLES DRAWN AEROBIC AND ANAEROBIC Blood Culture adequate volume   Culture   Final    NO GROWTH 5 DAYS Performed at [facility], [place] [address], [place], [place] [address]    Report Status 06/11/2021 FINAL  Final      RN Pressure Injury Documentation:     Estimated body mass index is 23.03 kg/m as calculated from the following:   Height as of this encounter: 5\' 10"  (1.778 m).   Weight as of this encounter: 72.8 kg.  Malnutrition Type:  Nutrition Problem: Severe Malnutrition Etiology: chronic illness (Alzheimers dementia, DM, CKD, CHF)   Malnutrition Characteristics:  Signs/Symptoms: moderate fat depletion, severe fat depletion, moderate muscle depletion, severe muscle depletion, percent weight loss Percent weight loss: 23 %   Nutrition Interventions:  Interventions: Tube feeding, MVI, Prostat   Radiology Studies: Cardiac event monitor  Result Date: 06/13/2021 Long-term monitor report. Patient is being monitored from 05/19/2021 to 06/06/2021.  Reason for the monitor was to detect atrial fibrillation since patient had history of CVA. Multiple automatically detected episode of ventricular tachycardia noted.  Longest episode was 15 seconds. To trigger events showing sinus rhythm with PVCs.  Symptomatology during the time was lightheadedness. Summary conclusions: Ventricular tachycardia noted. Symptomatic PVCs   Scheduled Meds:  carvedilol  25 mg Per NG tube BID WC   chlorhexidine  15 mL Mouth Rinse BID   Chlorhexidine Gluconate Cloth  6 each Topical Q0600   feeding supplement (PROSource TF)  45 mL Per Tube BID   free water  400 mL Per Tube Q6H   heparin injection (subcutaneous)  5,000 Units Subcutaneous Q8H   hydrALAZINE  100 mg Per Tube TID   iron  polysaccharides  150 mg Per Tube Daily   mouth rinse  15 mL Mouth Rinse q12n4p   multivitamin with minerals  1 tablet Per Tube Daily   neomycin-bacitracin-polymyxin  1 application Topical Daily  pantoprazole sodium  40 mg Per Tube QHS   senna-docusate  1 tablet Per Tube BID   sodium chloride flush  3 mL Intravenous Once   Continuous Infusions:  dextrose 75 mL/hr at 06/13/21 0811   feeding supplement (OSMOLITE 1.5 CAL) 1,000 mL (06/13/21 1054)    LOS: 16 days   Kerney Elbe, DO [facility] PAGER is on [place]  If 7PM-7AM, please contact night-coverage [email or web address]

## 2021-06-13 NOTE — TOC Progression Note (Signed)
Transition of Care [facility]) - Progression Note    Patient Details  Name: Casey Greene MRN: [id number] Date of Birth: 02-11-1951  Transition of Care [facility]) CM/SW [place], [place] Phone Number: 06/13/2021, 1:37 PM  Clinical Narrative:   CSW received a decline from [facility] today on patient. CSW spoke with [place] at [facility] and [place]el [place] at [facility] to review referral. [facility] has offered, and [facility] is still reviewing. CSW spoke with patient's wife to provide update, and she would like to discuss with her children and will get back to CSW with SNF choice. CSW to follow.    Expected Discharge Plan: [place] Barriers to Discharge: Continued Medical Work up, Ship broker  Expected Discharge Plan and Services Expected Discharge Plan: [place] Choice: [place] Living arrangements for the past 2 months: Apartment                                       Social Determinants of Health (SDOH) Interventions    Readmission Risk Interventions Readmission Risk Prevention Plan 10/09/2020  Medication Screening Complete  Transportation Screening Complete  Some recent data might be hidden

## 2021-06-13 NOTE — Progress Notes (Signed)
Nutrition Follow-up  DOCUMENTATION CODES:   Severe malnutrition in context of chronic illness  INTERVENTION:  Continue TF via PEG: Osmolite 1.5 at 55 ml/h (1320 ml per day) Prosource TF 45 ml BID  Provides 2060 kcal, 104 gm protein, 1003 ml free water daily  MVI with minerals per tube; daily Free water per MD/PA, currently 413m Q6H Recommend decreasing free water flushes at this time given hyponatremia  NUTRITION DIAGNOSIS:   Severe Malnutrition related to chronic illness (Alzheimers dementia, DM, CKD, CHF) as evidenced by moderate fat depletion, severe fat depletion, moderate muscle depletion, severe muscle depletion, percent weight loss. 23% x 6 months -- ongoing  GOAL:   Patient will meet greater than or equal to 90% of their needs -- met with TF  MONITOR:   Diet advancement, TF tolerance, Labs  REASON FOR ASSESSMENT:   Consult Enteral/tube feeding initiation and management  ASSESSMENT:   Pt with PMH of CKD stage III, HLD, HTN, mild neurocognitive disorder, PVD, CHF, DM, remote strokes now admitted with R thalamic hemorrhage wit intraventricular extension and hydrocephalus with L hemiparesis.    7/3 admit to neuro ICU 7/5 failed swallow; cortrak placed  [facility], suspicions for [facility]iration (no PNA noted in repeat CXRs) 7/15 s/p PEG placement  Per MD, pt continues to wax and wane and remains difficult to arouse at times. CT head on 7/12 showed decreased R thalamic/IVH since last week; CXR continues to show no PNA. EEG also performed which was suggestive of moderate diffuse encephalopathy with nonspecific etiology -- no seizures or epileptiform discharges seen.   Pt's mental status is noted to be improving, but pt remains lethargic and unresponsive to RD questions. Pt tolerating TF via PEG per RN. Current regimen: Osmolite 1.5 @ 561mhr with 4584mrosource TF BID, 400m19mee water Q6H (per MD).   Disposition: discharging to SNF in next 1-2 days  pending bed availability   Admission weight: 73.9 kg Current weight: 72.8 kg   Medications: Scheduled Meds:  carvedilol  25 mg Per NG tube BID WC   chlorhexidine  15 mL Mouth Rinse BID   Chlorhexidine Gluconate Cloth  6 each Topical Q0600   feeding supplement (PROSource TF)  45 mL Per Tube BID   free water  400 mL Per Tube Q6H   heparin injection (subcutaneous)  5,000 Units Subcutaneous Q8H   hydrALAZINE  100 mg Per Tube TID   iron polysaccharides  150 mg Per Tube Daily   mouth rinse  15 mL Mouth Rinse q12n4p   multivitamin with minerals  1 tablet Per Tube Daily   neomycin-bacitracin-polymyxin  1 application Topical Daily   pantoprazole sodium  40 mg Per Tube QHS   senna-docusate  1 tablet Per Tube BID   sodium chloride flush  3 mL Intravenous Once   Continuous Infusions:  dextrose 75 mL/hr at 06/13/21 0811   feeding supplement (OSMOLITE 1.5 CAL) 1,000 mL (06/13/21 1054)   Labs: Recent Labs  Lab 06/11/21 0503 06/12/21 0117 06/13/21 0420  NA 141 140 133*  K 3.6 3.6 3.9  CL 112* 109 103  CO2 _0 BUN 90* 87* 80*  CREATININE 2.46* 2.30* 2.14*  CALCIUM 10.7* 10.7* 10.1  MG 2.6* 2.4 2.2  PHOS 4.2 3.8 3.1  GLUCOSE 149* 142* 118*  CBGs 130-139-122  UOP: 2400ml92m hours Stool: 2x unmeasured occurrences x24 hours  Diet Order:   Diet Order     None       EDUCATION  NEEDS:   Not appropriate for education at this time  Skin:  Skin Assessment: Reviewed RN Assessment  Last BM:  7/19 type 6  Height:   Ht Readings from Last 1 Encounters:  05/28/21 5' 10" (1.778 m)    Weight:   Wt Readings from Last 1 Encounters:  06/13/21 72.8 kg     BMI:  Body mass index is 23.03 kg/m.  Estimated Nutritional Needs:   Kcal:  1900-2100  Protein:  95-110 grams  Fluid:  > 1.8 L/day   Larkin Ina, MS, RD, LDN (she/her/hers) RD pager number and weekend/on-call pager number located in [place].

## 2021-06-14 DIAGNOSIS — E871 Hypo-osmolality and hyponatremia: Secondary | ICD-10-CM

## 2021-06-14 DIAGNOSIS — R1310 Dysphagia, unspecified: Secondary | ICD-10-CM

## 2021-06-14 DIAGNOSIS — J9601 Acute respiratory failure with hypoxia: Secondary | ICD-10-CM

## 2021-06-14 LAB — COMPREHENSIVE METABOLIC PANEL
ALT: 13 U/L (ref 0–44)
AST: 35 U/L (ref 15–41)
Albumin: 2.3 g/dL — ABNORMAL LOW (ref 3.5–5.0)
Alkaline Phosphatase: 39 U/L (ref 38–126)
Anion gap: 8 (ref 5–15)
BUN: 77 mg/dL — ABNORMAL HIGH (ref 8–23)
CO2: 24 mmol/L (ref 22–32)
Calcium: 10.3 mg/dL (ref 8.9–10.3)
Chloride: 100 mmol/L (ref 98–111)
Creatinine, Ser: 2.06 mg/dL — ABNORMAL HIGH (ref 0.61–1.24)
GFR, Estimated: 34 mL/min — ABNORMAL LOW (ref >60)
Glucose, Bld: 131 mg/dL — ABNORMAL HIGH (ref 70–99)
Potassium: 3.9 mmol/L (ref 3.5–5.1)
Sodium: 132 mmol/L — ABNORMAL LOW (ref 135–145)
Total Bilirubin: 0.7 mg/dL (ref 0.3–1.2)
Total Protein: 5.6 g/dL — ABNORMAL LOW (ref 6.5–8.1)

## 2021-06-14 LAB — CBC WITH DIFFERENTIAL/PLATELET
Abs Immature Granulocytes: 0.03 10*3/uL (ref 0.00–0.07)
Basophils Absolute: 0 10*3/uL (ref 0.0–0.1)
Basophils Relative: 0 %
Eosinophils Absolute: 0.1 10*3/uL (ref 0.0–0.5)
Eosinophils Relative: 1 %
HCT: 24.3 % — ABNORMAL LOW (ref 39.0–52.0)
Hemoglobin: 7.9 g/dL — ABNORMAL LOW (ref 13.0–17.0)
Immature Granulocytes: 0 %
Lymphocytes Relative: 3 %
Lymphs Abs: 0.2 10*3/uL — ABNORMAL LOW (ref 0.7–4.0)
MCH: 31.6 pg (ref 26.0–34.0)
MCHC: 32.5 g/dL (ref 30.0–36.0)
MCV: 97.2 fL (ref 80.0–100.0)
Monocytes Absolute: 0.4 10*3/uL (ref 0.1–1.0)
Monocytes Relative: 6 %
Neutro Abs: 6.1 10*3/uL (ref 1.7–7.7)
Neutrophils Relative %: 90 %
Platelets: 272 10*3/uL (ref 150–400)
RBC: 2.5 MIL/uL — ABNORMAL LOW (ref 4.22–5.81)
RDW: 14.1 % (ref 11.5–15.5)
WBC: 6.9 10*3/uL (ref 4.0–10.5)
nRBC: 0 % (ref 0.0–0.2)

## 2021-06-14 LAB — PHOSPHORUS: Phosphorus: 3 mg/dL (ref 2.5–4.6)

## 2021-06-14 LAB — GLUCOSE, CAPILLARY
Glucose-Capillary: 112 mg/dL — ABNORMAL HIGH (ref 70–99)
Glucose-Capillary: 123 mg/dL — ABNORMAL HIGH (ref 70–99)
Glucose-Capillary: 131 mg/dL — ABNORMAL HIGH (ref 70–99)
Glucose-Capillary: 132 mg/dL — ABNORMAL HIGH (ref 70–99)
Glucose-Capillary: 135 mg/dL — ABNORMAL HIGH (ref 70–99)
Glucose-Capillary: 136 mg/dL — ABNORMAL HIGH (ref 70–99)

## 2021-06-14 LAB — MAGNESIUM: Magnesium: 2.2 mg/dL (ref 1.7–2.4)

## 2021-06-14 MED ORDER — FREE WATER
400.0000 mL | Freq: Three times a day (TID) | Status: DC
  Administered 2021-06-15 – 2021-06-16 (×5): 400 mL

## 2021-06-14 MED ORDER — ATORVASTATIN CALCIUM 40 MG PO TABS
40.0000 mg | ORAL_TABLET | Freq: Every day | ORAL | Status: DC
  Administered 2021-06-14 – 2021-06-16 (×3): 40 mg
  Filled 2021-06-14 (×3): qty 1

## 2021-06-14 NOTE — Assessment & Plan Note (Addendum)
--   resolved -- secondary to microaspiration -- Treated with 7 days of Unasyn per ID.

## 2021-06-14 NOTE — Assessment & Plan Note (Signed)
stable °

## 2021-06-14 NOTE — Progress Notes (Signed)
Physical Therapy Treatment Patient Details Name: Casey Greene MRN: [id number] DOB: 1951-01-27 Today's Date: 06/14/2021    History of Present Illness 11 male admitted to [facility] on 7/3 with R thalamic hemorrhage with intraventricular extension and hydrocephalus, no plan for neurosurgical intervention. PMH includes CKD stage III, hyperlipidemia, essential hypertension, LVH, mild neurocognitive disorder on Aricept, peripheral vascular disease, chronic diastolic heart failure, ACD, type 2 diabetes mellitus, and remote strokes identified on MRI in January 2022- remote pontine and bilateral thalamic infarcts and subacute infarcts identified on imaging in June 2022 on aspirin and clopidogrel.    PT Comments    Pt seen with OT to maximize functional potential and safety with mobility. Pt lethargic throughout session however intermittently with eyes open and attempting to answer questions. Focus of session was EOB activity after peri-care in bed. Pt sat EOB ~10 minutes with minimal participation. Will continue to follow and progress as able per POC.    Follow Up Recommendations  SNF     Equipment Recommendations  Hospital bed;Wheelchair (measurements PT);Wheelchair cushion (measurements PT) (hoyer lift)    Recommendations for Other Services Rehab consult     Precautions / Restrictions Precautions Precautions: Fall Precaution Comments: BP <140 Restrictions Weight Bearing Restrictions: No    Mobility  Bed Mobility Overal bed mobility: Needs Assistance Bed Mobility: Rolling;Sidelying to Sit;Sit to Sidelying Rolling: +2 for physical assistance;Total assist Sidelying to sit: Total assist;+2 for physical assistance     Sit to sidelying: Total assist;+2 for physical assistance General bed mobility comments: Multimodal cues for log roll with minimal follow-through noted. Heavy +2 assist for all aspects of bed mobility.    Transfers                 General transfer comment: Deferred OOB  mobility  Ambulation/Gait                 Stairs             Wheelchair Mobility    Modified Rankin (Stroke Patients Only) Modified Rankin (Stroke Patients Only) Pre-Morbid Rankin Score: Slight disability Modified Rankin: Severe disability     Balance Overall balance assessment: Needs assistance Sitting-balance support: Feet supported;Bilateral upper extremity supported Sitting balance-Leahy Scale: Poor Sitting balance - Comments: Up to max assist required to hold midline posture       Standing balance comment: Did not assess this session.                            Cognition Arousal/Alertness: Lethargic Behavior During Therapy: Flat affect Overall Cognitive Status: Difficult to assess                                        Exercises Other Exercises Other Exercises: L shoulder PROM with scapular faciliatation Other Exercises: Cervical PROM stretch - flexion, extension, rotation R and L, and lateral flexion R and L Other Exercises: Trunk rotation stretch R and L with Pec stretch Other Exercises: RLE knee extension - essentially PROM. Min quad activation reported by OT during EOB activity.    General Comments        Pertinent Vitals/Pain Pain Assessment: Faces Faces Pain Scale: Hurts little more Pain Location: unable to indicate specific location Pain Descriptors / Indicators: Grimacing Pain Intervention(s): Limited activity within patient's tolerance;Monitored during session;Repositioned    Home Living  Prior Function            PT Goals (current goals can now be found in the care plan section) Acute Rehab PT Goals Patient Stated Goal: did not state, lethargic PT Goal Formulation: Patient unable to participate in goal setting Time For Goal Achievement: 06/28/21 Potential to Achieve Goals: Fair Progress towards PT goals: Not progressing toward goals - comment (lethargy limiting  progress)    Frequency    Min 2X/week      PT Plan Current plan remains appropriate    Co-evaluation PT/OT/SLP Co-Evaluation/Treatment: Yes Reason for Co-Treatment: For patient/therapist safety;To address functional/ADL transfers PT goals addressed during session: Mobility/safety with mobility;Balance;Strengthening/ROM        AM-PAC PT "6 Clicks" Mobility   Outcome Measure  Help needed turning from your back to your side while in a flat bed without using bedrails?: Total Help needed moving from lying on your back to sitting on the side of a flat bed without using bedrails?: Total Help needed moving to and from a bed to a chair (including a wheelchair)?: Total Help needed standing up from a chair using your arms (e.g., wheelchair or bedside chair)?: Total Help needed to walk in hospital room?: Total Help needed climbing 3-5 steps with a railing? : Total 6 Click Score: 6    End of Session Equipment Utilized During Treatment:  (bed pad) Activity Tolerance: Patient limited by lethargy Patient left: in bed;with call bell/phone within reach;with bed alarm set Nurse Communication: Mobility status PT Visit Diagnosis: Muscle weakness (generalized) (M62.81);Other symptoms and signs involving the nervous system (R29.898)     Time: 3559-7416 PT Time Calculation (min) (ACUTE ONLY): 24 min  Charges:  $Therapeutic Activity: 8-22 mins                     Rolinda Roan, PT, DPT Acute Rehabilitation Services Pager: [phone] Office: [phone]    Thelma Comp 06/14/2021, 1:14 PM

## 2021-06-14 NOTE — Assessment & Plan Note (Signed)
--  continue management per dietician

## 2021-06-14 NOTE — Assessment & Plan Note (Signed)
--   Left-sided hemiplegia, dysphagia.  Mentation waxes and wanes per chart. -- SNF planned.  Follow-up with neurology as an outpatient.

## 2021-06-14 NOTE — Progress Notes (Signed)
  Speech Language Pathology Treatment: Dysphagia  Patient Details Name: Casey Greene MRN: [id number] DOB: Apr 14, 1951 Today's Date: 06/14/2021 Time: 3837-7939 SLP Time Calculation (min) (ACUTE ONLY): 16 min  Assessment / Plan / Recommendation Clinical Impression  Pt seen for skilled observation for PO readiness with oral care completed prior to consumption.  Pt able to answer simple orientation questions such as DOB and name and speak in short sentence with limited intelligibility d/t decreased intensity.  Lip closure/increased oral manipulation noted with mod verbal/visual cueing provided by SLP with ice chips without delayed coughing noted this tx session. Continued ST recommended to determine continued PO readiness/cognitive function.   HPI HPI: 70 yr old admitted with LUE and LLE weakness. CT revealed acute right thalamic hemorrhage. 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      SLP Plan  Continue with current plan of care       Recommendations  Diet recommendations: NPO Medication Administration: Via alternative means                Oral Care Recommendations: Oral care QID Follow up Recommendations: Skilled Nursing facility SLP Visit Diagnosis: Dysphagia, oropharyngeal phase (R13.12) Plan: Continue with current plan of care                      Elvina Sidle, M.S., CCC-SLP 06/14/2021, 2:39 PM

## 2021-06-14 NOTE — Progress Notes (Addendum)
PROGRESS NOTE  Casey Greene WHQ:[id number] DOB: Sep 19, 1951 DOA: 05/28/2021 PCP: [name], MD  Brief History   70 year old man presenting with left-sided weakness, admitted for acute right thalamic hemorrhagic infarct by neurology to ICU, subsequently transferred to hospitalist service.  Other issues included concern for microaspiration treated with Unasyn and dysphagia.  Ultimately PEG tube placed.  A & P  * ICH (intracerebral hemorrhage) (HCC) -- Left-sided hemiplegia, dysphagia.  Mentation waxes and wanes per chart. -- SNF planned.  Follow-up with neurology as an outpatient.  Dysphagia -- Status post PEG tube placement 7/15, continue tube feeds.  AKI (acute kidney injury) ([place]) -- Superimposed on CKD stage IIIb.  Baseline appears to be 2-2.2.  Creatinine appears back to baseline at this point.  Acute hypoxemic respiratory failure (HCC) --secondary to microaspiration -- Treated with 7 days of Unasyn per ID.  Hyponatremia --mild, check BMP in AM, probably from TF  Severe protein-calorie malnutrition ([place]) --continue management per dietician  Anemia of chronic disease --stable  Essential hypertension -- Continue carvedilol, hydralazine.   Nutritional Assessment: Body mass index is 24.29 kg/m.Marland Kitchen Seen by dietician.  I agree with the assessment and plan as outlined below: Nutrition Status: Nutrition Problem: Severe Malnutrition Etiology: chronic illness (Alzheimers dementia, DM, CKD, CHF) Signs/Symptoms: moderate fat depletion, severe fat depletion, moderate muscle depletion, severe muscle depletion, percent weight loss Percent weight loss: 23 % Interventions: Tube feeding, MVI, Prostat  Disposition Plan:  Discussion:   Status is: Inpatient  Remains inpatient appropriate because:Unsafe d/c plan  Dispo: The patient is from: Home              Anticipated d/c is to: SNF              Patient currently is medically stable to d/c.   Difficult to place patient  Yes  DVT prophylaxis: heparin injection 5,000 Units Start: 06/02/21 1515   Code Status: DNR Level of care: Progressive Family Communication: wife by telephone  Murray Hodgkins, MD  [facility] Direct contact: see [email or web address] (further directions at bottom of note if needed) 7PM-7AM contact night coverage as at bottom of note 06/14/2021, 6:44 PM  LOS: 17 days   Significant Hospital Events      Consults:     Procedures:    Significant Diagnostic Tests:     Micro Data:     Antimicrobials:    Interval History/Subjective  CC: f/u stroke  Feels ok today  Objective   Vitals:  Vitals:   06/14/21 1552 06/14/21 1658  BP: (!) 144/81 (!) 142/84  Pulse:  77  Resp: 16   Temp: 98.3 F (36.8 C)   SpO2: 100%     Exam: Physical Exam Vitals and nursing note reviewed.  Constitutional:      General: He is not in acute distress.    Appearance: He is not ill-appearing or toxic-appearing.  Cardiovascular:     Rate and Rhythm: Normal rate and regular rhythm.     Heart sounds: No murmur heard.   No gallop.     Comments: Telemetry SR Pulmonary:     Effort: Pulmonary effort is normal. No respiratory distress.     Breath sounds: Normal breath sounds. No wheezing or rales.  Abdominal:     Palpations: Abdomen is soft.     Comments: PEG in place  Musculoskeletal:     Comments: Does not follow commands  Psychiatric:     Comments: Appears confused    I have personally reviewed the labs and other  data, making special note of:   Today's Data  CBG stable Na+ 132 Creatinine down to 2.06 Hgb stable 7.9  Scheduled Meds:  carvedilol  25 mg Per NG tube BID WC   chlorhexidine  15 mL Mouth Rinse BID   Chlorhexidine Gluconate Cloth  6 each Topical Q0600   feeding supplement (PROSource TF)  45 mL Per Tube BID   free water  400 mL Per Tube Q6H   heparin injection (subcutaneous)  5,000 Units Subcutaneous Q8H   hydrALAZINE  100 mg Per Tube TID   iron polysaccharides  150 mg  Per Tube Daily   mouth rinse  15 mL Mouth Rinse q12n4p   multivitamin with minerals  1 tablet Per Tube Daily   neomycin-bacitracin-polymyxin  1 application Topical Daily   pantoprazole sodium  40 mg Per Tube QHS   senna-docusate  1 tablet Per Tube BID   sodium chloride flush  3 mL Intravenous Once   Continuous Infusions:  feeding supplement (OSMOLITE 1.5 CAL) 1,000 mL (06/14/21 0928)    Principal Problem:   ICH (intracerebral hemorrhage) ([place]) Active Problems:   Dysphagia   AKI (acute kidney injury) ([place])   Essential hypertension   Anemia of chronic disease   Severe protein-calorie malnutrition ([place])   Hyponatremia   Acute hypoxemic respiratory failure ([place])   LOS: 17 days   How to contact the [facility] Attending or Consulting provider 7A - 7P or covering provider during after hours [place], for this patient?  Check the care team in [facility] and look for a) attending/consulting TRH provider listed and b) the [facility] team listed Log into [email or web address] and use [place]'s universal password to access. If you do not have the password, please contact the hospital operator. Locate the [facility] provider you are looking for under [facility] and page to a number that you can be directly reached. If you still have difficulty reaching the provider, please page the [facility] (Director on Call) for the Hospitalists listed on amion for assistance.

## 2021-06-14 NOTE — Assessment & Plan Note (Signed)
--   Status post PEG tube placement 7/15, continue tube feeds.

## 2021-06-14 NOTE — Assessment & Plan Note (Addendum)
resolved 

## 2021-06-14 NOTE — Assessment & Plan Note (Addendum)
--  stable. Continue carvedilol, hydralazine.

## 2021-06-14 NOTE — TOC Progression Note (Signed)
Transition of Care [facility]) - Progression Note    Patient Details  Name: Casey Greene MRN: [id number] Date of Birth: 02/04/1951  [facility] CM/SW Contact  Pollie Friar, RN Phone Number: 06/14/2021, 11:31 AM  Clinical Narrative:    Pts wife has bed offers but has not made a decision. CM reached out to her and she was asking to be able to tour the facilities this weekend. CM has updated her that we need a decision ASAP as patient is medically ready for d/c. She is going to talk to her children and update CM later today.  Will begin insurance auth with [employer].  TOC following.   Expected Discharge Plan: [place] Barriers to Discharge: Continued Medical Work up, Ship broker  Expected Discharge Plan and Services Expected Discharge Plan: [place] Choice: [place] Living arrangements for the past 2 months: Apartment                                       Social Determinants of Health (SDOH) Interventions    Readmission Risk Interventions Readmission Risk Prevention Plan 10/09/2020  Medication Screening Complete  Transportation Screening Complete  Some recent data might be hidden

## 2021-06-14 NOTE — Plan of Care (Signed)
  Problem: Nutrition: Goal: Risk of aspiration will decrease Outcome: Progressing   Problem: Self-Care: Goal: Ability to communicate needs accurately will improve Outcome: Progressing

## 2021-06-14 NOTE — Hospital Course (Addendum)
70 year old man presenting with left-sided weakness, admitted for acute right thalamic hemorrhagic infarct by neurology to ICU, subsequently transferred to hospitalist service.  Other issues included concern for microaspiration treated with Unasyn and dysphagia.  Ultimately PEG tube placed.  Plan for SNF.  #) Chronic hypercalcemia: Documented history of such, with apparent baseline serum calcium range of 10.3-10.6.  Follows with outpatient nephrology, as further established above.  Patient's baseline serum calcium range appears to be consistent with the above, there are several serum calcium in the last few months that are not associated with concomitant serum albumin levels for evaluation of the need for hypoalbuminemic correction.  Interestingly, no significant decline in serum initiation of Lasix, as above.

## 2021-06-14 NOTE — Assessment & Plan Note (Addendum)
--   Superimposed on CKD stage IIIb.  Baseline appears to be 2-2.2.  Creatinine stable, back to baseline

## 2021-06-14 NOTE — Progress Notes (Signed)
Occupational Therapy Treatment Patient Details Name: Casey Greene MRN: [id number] DOB: 08-20-1951 Today's Date: 06/14/2021    History of present illness 63 male admitted to [facility] on 7/3 with R thalamic hemorrhage with intraventricular extension and hydrocephalus, no plan for neurosurgical intervention. PMH includes CKD stage III, hyperlipidemia, essential hypertension, LVH, mild neurocognitive disorder on Aricept, peripheral vascular disease, chronic diastolic heart failure, ACD, type 2 diabetes mellitus, and remote strokes identified on MRI in January 2022- remote pontine and bilateral thalamic infarcts and subacute infarcts identified on imaging in June 2022 on aspirin and clopidogrel.   OT comments  Pt progressed to EOB sitting and scapula mobility for the L UE this session. Pt noted to have digit movement in L UE this session. Pt returned to supine after BP decreased noted. Could benefit from SCD or ted hose next session to progress patient. Pt incontinence of bowel and not awareness. Recommendation for SNF   Follow Up Recommendations  SNF    Equipment Recommendations  3 in 1 bedside commode    Recommendations for Other Services      Precautions / Restrictions Precautions Precautions: Fall Precaution Comments: BP <140 Restrictions Weight Bearing Restrictions: No       Mobility Bed Mobility Overal bed mobility: Needs Assistance Bed Mobility: Rolling;Sidelying to Sit;Sit to Sidelying Rolling: +2 for physical assistance;Total assist Sidelying to sit: Total assist;+2 for physical assistance     Sit to sidelying: Total assist;+2 for physical assistance General bed mobility comments: Multimodal cues for log roll with minimal follow-through noted. Heavy +2 assist for all aspects of bed mobility.    Transfers                 General transfer comment: Deferred OOB mobility    Balance Overall balance assessment: Needs assistance Sitting-balance support: Feet  supported;Bilateral upper extremity supported Sitting balance-Leahy Scale: Poor Sitting balance - Comments: Up to max assist required to hold midline posture       Standing balance comment: Did not assess this session.                           ADL either performed or assessed with clinical judgement   ADL Overall ADL's : Needs assistance/impaired Eating/Feeding: NPO   Grooming: Total assistance                                 General ADL Comments: total for all adls     Vision       Perception     Praxis      Cognition Arousal/Alertness: Lethargic Behavior During Therapy: Flat affect Overall Cognitive Status: Difficult to assess                                          Exercises Exercises: Other exercises Other Exercises Other Exercises: L shoulder PROM with scapular faciliatation Other Exercises: Cervical PROM stretch - flexion, extension, rotation R and L, and lateral flexion R and L Other Exercises: Trunk rotation stretch R and L with Pec stretch Other Exercises: RLE knee extension - essentially PROM. Min quad activation reported by OT during EOB activity.   Shoulder Instructions       General Comments 122/70 and after sitting 105/79    Pertinent Vitals/ Pain       Pain  Assessment: Faces Faces Pain Scale: Hurts little more Pain Location: unable to indicate specific location Pain Descriptors / Indicators: Grimacing Pain Intervention(s): Monitored during session;Limited activity within patient's tolerance  Home Living                                          Prior Functioning/Environment              Frequency  Min 2X/week        Progress Toward Goals  OT Goals(current goals can now be found in the care plan section)  Progress towards OT goals: Progressing toward goals  Acute Rehab OT Goals Patient Stated Goal: did not state, lethargic OT Goal Formulation: With family Time For  Goal Achievement: 06/28/21 Potential to Achieve Goals: Fair ADL Goals Pt Will Perform Grooming: with mod assist;sitting Pt Will Transfer to Toilet: with +2 assist;with mod assist;stand pivot transfer;bedside commode Additional ADL Goal #1: pt will follow 2 step command 50% of session Additional ADL Goal #2: Pt will complete bed mobility min (A) +2 as precursor to adls.  Plan Discharge plan remains appropriate    Co-evaluation    PT/OT/SLP Co-Evaluation/Treatment: Yes Reason for Co-Treatment: Complexity of the patient's impairments (multi-system involvement);Necessary to address cognition/behavior during functional activity;For patient/therapist safety;To address functional/ADL transfers PT goals addressed during session: Mobility/safety with mobility;Balance;Strengthening/ROM OT goals addressed during session: ADL's and self-care;Proper use of Adaptive equipment and DME;Strengthening/ROM      AM-PAC OT "6 Clicks" Daily Activity     Outcome Measure   Help from another person eating meals?: Total Help from another person taking care of personal grooming?: Total Help from another person toileting, which includes using toliet, bedpan, or urinal?: Total Help from another person bathing (including washing, rinsing, drying)?: Total Help from another person to put on and taking off regular upper body clothing?: Total Help from another person to put on and taking off regular lower body clothing?: Total 6 Click Score: 6    End of Session    OT Visit Diagnosis: Unsteadiness on feet (R26.81);Muscle weakness (generalized) (M62.81)   Activity Tolerance Patient limited by lethargy   Patient Left with family/visitor present;with call bell/phone within reach;with bed alarm set;in bed   Nurse Communication Mobility status        Time: 5366-4403 OT Time Calculation (min): 23 min  Charges: OT General Charges $OT Visit: 1 Visit OT Treatments $Therapeutic Activity: 8-22 mins   Brynn,  OTR/L  Acute Rehabilitation Services Pager: [phone] Office: [phone] .    Jeri Modena 06/14/2021, 1:47 PM

## 2021-06-15 ENCOUNTER — Encounter: Payer: Self-pay | Admitting: Family Medicine

## 2021-06-15 DIAGNOSIS — D351 Benign neoplasm of parathyroid gland: Secondary | ICD-10-CM | POA: Insufficient documentation

## 2021-06-15 LAB — BASIC METABOLIC PANEL
Anion gap: 7 (ref 5–15)
BUN: 69 mg/dL — ABNORMAL HIGH (ref 8–23)
CO2: 24 mmol/L (ref 22–32)
Calcium: 10.6 mg/dL — ABNORMAL HIGH (ref 8.9–10.3)
Chloride: 104 mmol/L (ref 98–111)
Creatinine, Ser: 1.97 mg/dL — ABNORMAL HIGH (ref 0.61–1.24)
GFR, Estimated: 35 mL/min — ABNORMAL LOW (ref >60)
Glucose, Bld: 123 mg/dL — ABNORMAL HIGH (ref 70–99)
Potassium: 4.3 mmol/L (ref 3.5–5.1)
Sodium: 135 mmol/L (ref 135–145)

## 2021-06-15 LAB — GLUCOSE, CAPILLARY
Glucose-Capillary: 101 mg/dL — ABNORMAL HIGH (ref 70–99)
Glucose-Capillary: 119 mg/dL — ABNORMAL HIGH (ref 70–99)
Glucose-Capillary: 122 mg/dL — ABNORMAL HIGH (ref 70–99)
Glucose-Capillary: 130 mg/dL — ABNORMAL HIGH (ref 70–99)
Glucose-Capillary: 132 mg/dL — ABNORMAL HIGH (ref 70–99)
Glucose-Capillary: 136 mg/dL — ABNORMAL HIGH (ref 70–99)
Glucose-Capillary: 139 mg/dL — ABNORMAL HIGH (ref 70–99)

## 2021-06-15 LAB — SARS CORONAVIRUS 2 (TAT 6-24 HRS): SARS Coronavirus 2: NEGATIVE

## 2021-06-15 NOTE — TOC Progression Note (Signed)
Transition of Care [facility]) - Progression Note    Patient Details  Name: Casey Greene MRN: [id number] Date of Birth: 04/02/1951  Transition of Care [facility]) CM/SW Contact  Pollie Friar, RN Phone Number: 06/15/2021, 9:04 AM  Clinical Narrative:    Wife called CM this am and has decided on [facility]. CM has updated Cheri with [facility] and they are ordering his tube feedings. CM has ask [employer] to begin insurance auth. Covid ordered.  TOC following.   Expected Discharge Plan: [place] Barriers to Discharge: Continued Medical Work up, Ship broker  Expected Discharge Plan and Services Expected Discharge Plan: [place] Choice: [place] Living arrangements for the past 2 months: Apartment                                       Social Determinants of Health (SDOH) Interventions    Readmission Risk Interventions Readmission Risk Prevention Plan 10/09/2020  Medication Screening Complete  Transportation Screening Complete  Some recent data might be hidden

## 2021-06-15 NOTE — Assessment & Plan Note (Addendum)
--  appears chronic, intermittent, check BMP in AM and follow-up as outpatient

## 2021-06-15 NOTE — Progress Notes (Signed)
PROGRESS NOTE  Casey Greene MEQ:[id number] DOB: [birth year] DOA: 05/28/2021 PCP: [name], MD  Brief History   70 year old man presenting with left-sided weakness, admitted for acute right thalamic hemorrhagic infarct by neurology to ICU, subsequently transferred to hospitalist service.  Other issues included concern for microaspiration treated with Unasyn and dysphagia.  Ultimately PEG tube placed.  Plan for SNF.  A & P  * ICH (intracerebral hemorrhage) (HCC) -- Left-sided hemiplegia, dysphagia.  Mentation waxes and wanes per chart. -- SNF planned.  Follow-up with neurology as an outpatient.  Dysphagia -- Status post PEG tube placement 7/15, continue tube feeds.  AKI (acute kidney injury) ([place]) -- Superimposed on CKD stage IIIb.  Baseline appears to be 2-2.2.  Creatinine stable, back to baseline  Hypercalcemia --appears chronic, intermittent, check BMP in AM and follow-up as outpatient  Acute hypoxemic respiratory failure ([place]) -- resolved -- secondary to microaspiration -- Treated with 7 days of Unasyn per ID.  Hyponatremia --resolved  Severe protein-calorie malnutrition ([place]) --continue management per dietician  Anemia of chronic renal failure --stable  Essential hypertension --stable. Continue carvedilol, hydralazine.   Nutritional Assessment: Body mass index is 23.09 kg/m.Marland Kitchen Seen by dietician.  I agree with the assessment and plan as outlined below: Nutrition Status: Nutrition Problem: Severe Malnutrition Etiology: chronic illness (Alzheimers dementia, DM, CKD, CHF) Signs/Symptoms: moderate fat depletion, severe fat depletion, moderate muscle depletion, severe muscle depletion, percent weight loss Percent weight loss: 23 % Interventions: Tube feeding, MVI, Prostat  Disposition Plan:  Discussion:   Status is: Inpatient  Remains inpatient appropriate because:Unsafe d/c plan  Dispo: The patient is from: Home              Anticipated d/c is to: SNF               Patient currently is medically stable to d/c.   Difficult to place patient Yes  DVT prophylaxis: heparin injection 5,000 Units Start: 06/02/21 1515   Code Status: DNR Level of care: Progressive Family Communication:    Murray Hodgkins, MD  [facility] Direct contact: see [email or web address] (further directions at bottom of note if needed) 7PM-7AM contact night coverage as at bottom of note 06/15/2021, 6:27 PM  LOS: 18 days   Significant Hospital Events      Consults:     Procedures:    Significant Diagnostic Tests:     Micro Data:     Antimicrobials:    Interval History/Subjective  CC: f/u stroke  sleeping  Objective   Vitals:  Vitals:   06/15/21 1144 06/15/21 1542  BP: 131/78 (!) 141/70  Pulse: 77 79  Resp: 18 18  Temp: 98 F (36.7 C) 98.5 F (36.9 C)  SpO2: 100% 100%    Exam: Physical Exam Vitals reviewed.  Constitutional:      General: He is not in acute distress.    Appearance: He is not ill-appearing.  Cardiovascular:     Rate and Rhythm: Normal rate and regular rhythm.     Heart sounds: No murmur heard. Pulmonary:     Effort: Pulmonary effort is normal. No respiratory distress.     Breath sounds: Normal breath sounds. No wheezing.  Neurological:     Comments: Left-sided weakness    I have personally reviewed the labs and other data, making special note of:   Today's Data  CBG remains stable Na+ 132 Creatinine down to 1.97, BUN down to 69  Scheduled Meds:  atorvastatin  40 mg Per Tube Daily   carvedilol  25 mg Per NG tube BID WC   chlorhexidine  15 mL Mouth Rinse BID   feeding supplement (PROSource TF)  45 mL Per Tube BID   free water  400 mL Per Tube Q8H   heparin injection (subcutaneous)  5,000 Units Subcutaneous Q8H   hydrALAZINE  100 mg Per Tube TID   iron polysaccharides  150 mg Per Tube Daily   mouth rinse  15 mL Mouth Rinse q12n4p   multivitamin with minerals  1 tablet Per Tube Daily   neomycin-bacitracin-polymyxin   1 application Topical Daily   pantoprazole sodium  40 mg Per Tube QHS   senna-docusate  1 tablet Per Tube BID   sodium chloride flush  3 mL Intravenous Once   Continuous Infusions:  feeding supplement (OSMOLITE 1.5 CAL) 55 mL/hr at 06/15/21 0600    Principal Problem:   ICH (intracerebral hemorrhage) ([place]) Active Problems:   Dysphagia   AKI (acute kidney injury) ([place])   Hypercalcemia   Essential hypertension   Anemia of chronic renal failure   Severe protein-calorie malnutrition ([place])   Hyponatremia   Acute hypoxemic respiratory failure ([place])   LOS: 18 days   How to contact the [facility] Attending or Consulting provider [place] or covering provider during after hours [place], for this patient?  Check the care team in [facility] and look for a) attending/consulting TRH provider listed and b) the [facility] team listed Log into [email or web address] and use [place]'s universal password to access. If you do not have the password, please contact the hospital operator. Locate the [facility] provider you are looking for under [facility] and page to a number that you can be directly reached. If you still have difficulty reaching the provider, please page the [facility] (Director on Call) for the Hospitalists listed on amion for assistance.

## 2021-06-15 NOTE — Plan of Care (Signed)
  Problem: Nutrition: Goal: Risk of aspiration will decrease Outcome: Progressing Goal: Dietary intake will improve Outcome: Progressing   Problem: Intracerebral Hemorrhage Tissue Perfusion: Goal: Complications of Intracerebral Hemorrhage will be minimized Outcome: Progressing   

## 2021-06-15 NOTE — Consult Note (Signed)
   [facility] Inpatient Consult   06/15/2021  Sargon Scouten 12-17-1950 [id number]  [place] Organization [ACO] Patient: [facility] Medicare  LOS follow up: 17d  Reviewed progress notes of PT/OT/ST and TOC team for disposition updates.  Patient is currently for SNF [Skilled Nursing Facility] and awaiting insurance approval.  Plan:  No [facility] Care Management assessed for post hospital transition as needs are to be met at a skilled nursing facility level of care. Will sign off at transition from the hospital, when appropriate.  For questions,  Natividad Brood, RN BSN [facility] [facility] Liaison  [phone] business mobile phone Toll free office [phone]  Fax number: [phone] [email or web address]

## 2021-06-16 DIAGNOSIS — Z431 Encounter for attention to gastrostomy: Secondary | ICD-10-CM | POA: Diagnosis not present

## 2021-06-16 DIAGNOSIS — R339 Retention of urine, unspecified: Secondary | ICD-10-CM | POA: Diagnosis not present

## 2021-06-16 DIAGNOSIS — E43 Unspecified severe protein-calorie malnutrition: Secondary | ICD-10-CM | POA: Diagnosis not present

## 2021-06-16 DIAGNOSIS — I619 Nontraumatic intracerebral hemorrhage, unspecified: Secondary | ICD-10-CM | POA: Diagnosis not present

## 2021-06-16 DIAGNOSIS — I739 Peripheral vascular disease, unspecified: Secondary | ICD-10-CM | POA: Diagnosis not present

## 2021-06-16 DIAGNOSIS — E1122 Type 2 diabetes mellitus with diabetic chronic kidney disease: Secondary | ICD-10-CM | POA: Diagnosis not present

## 2021-06-16 DIAGNOSIS — J9601 Acute respiratory failure with hypoxia: Secondary | ICD-10-CM | POA: Diagnosis not present

## 2021-06-16 DIAGNOSIS — I69154 Hemiplegia and hemiparesis following nontraumatic intracerebral hemorrhage affecting left non-dominant side: Secondary | ICD-10-CM | POA: Diagnosis not present

## 2021-06-16 DIAGNOSIS — I69191 Dysphagia following nontraumatic intracerebral hemorrhage: Secondary | ICD-10-CM | POA: Diagnosis not present

## 2021-06-16 LAB — BASIC METABOLIC PANEL
Anion gap: 7 (ref 5–15)
BUN: 65 mg/dL — ABNORMAL HIGH (ref 8–23)
CO2: 24 mmol/L (ref 22–32)
Calcium: 11.3 mg/dL — ABNORMAL HIGH (ref 8.9–10.3)
Chloride: 104 mmol/L (ref 98–111)
Creatinine, Ser: 2 mg/dL — ABNORMAL HIGH (ref 0.61–1.24)
GFR, Estimated: 35 mL/min — ABNORMAL LOW (ref >60)
Glucose, Bld: 120 mg/dL — ABNORMAL HIGH (ref 70–99)
Potassium: 4.7 mmol/L (ref 3.5–5.1)
Sodium: 135 mmol/L (ref 135–145)

## 2021-06-16 LAB — GLUCOSE, CAPILLARY
Glucose-Capillary: 130 mg/dL — ABNORMAL HIGH (ref 70–99)
Glucose-Capillary: 135 mg/dL — ABNORMAL HIGH (ref 70–99)
Glucose-Capillary: 140 mg/dL — ABNORMAL HIGH (ref 70–99)
Glucose-Capillary: 135 mg/dL — ABNORMAL HIGH (ref 70–99)
Glucose-Capillary: 119 mg/dL — ABNORMAL HIGH (ref 70–99)

## 2021-06-16 MED ORDER — SENNOSIDES-DOCUSATE SODIUM 8.6-50 MG PO TABS: 1.0000 | ORAL_TABLET | Freq: Two times a day (BID) | ORAL | Status: AC

## 2021-06-16 MED ORDER — PROSOURCE TF PO LIQD: 45.0000 mL | Freq: Two times a day (BID) | ORAL | Status: AC

## 2021-06-16 MED ORDER — OSMOLITE 1.5 CAL PO LIQD: 1000.0000 mL | ORAL | 0 refills | Status: AC

## 2021-06-16 MED ORDER — HYDRALAZINE HCL 100 MG PO TABS: 100.0000 mg | ORAL_TABLET | Freq: Three times a day (TID) | ORAL | Status: AC

## 2021-06-16 MED ORDER — POLYSACCHARIDE IRON COMPLEX 150 MG PO CAPS: 150.0000 mg | ORAL_CAPSULE | Freq: Every day | ORAL | Status: AC

## 2021-06-16 MED ORDER — CARVEDILOL 25 MG PO TABS: 25.0000 mg | ORAL_TABLET | Freq: Two times a day (BID) | ORAL | Status: AC

## 2021-06-16 MED ORDER — PANTOPRAZOLE SODIUM 40 MG PO PACK: 40.0000 mg | PACK | Freq: Every day | ORAL | Status: AC

## 2021-06-16 MED ORDER — DONEPEZIL HCL 10 MG PO TABS
10.0000 mg | ORAL_TABLET | Freq: Every morning | ORAL | Status: DC
Start: 2021-06-16 — End: 2021-07-24

## 2021-06-16 MED ORDER — ATORVASTATIN CALCIUM 40 MG PO TABS: 40.0000 mg | ORAL_TABLET | Freq: Every day | ORAL | Status: AC

## 2021-06-16 MED ORDER — ADULT MULTIVITAMIN W/MINERALS CH: 1.0000 | ORAL_TABLET | Freq: Every day | ORAL | Status: AC

## 2021-06-16 MED ORDER — FREE WATER: 400.0000 mL | Freq: Three times a day (TID) | Status: AC

## 2021-06-16 NOTE — TOC Transition Note (Signed)
Transition of Care [facility]) - CM/SW Discharge Note   Patient Details  Name: Casey Greene MRN: [id number] Date of Birth: 01-17-1951  [facility] CM/SW Contact:  Pollie Friar, RN Phone Number: 06/16/2021, 2:31 PM   Clinical Narrative:    Patient is discharging to [facility]. [facility] to provide transport. Bedside RN updated and d/c packet at the desk.   Room: 611 Number for report: [phone]  Final next level of care: Skilled Nursing Facility Barriers to Discharge: No Barriers Identified   Patient Goals and CMS Choice Patient states their goals for this hospitalization and ongoing recovery are:: patient unable to participate in goal setting CMS Medicare.gov Compare Post Acute Care list provided to:: Patient Represenative (must comment) Choice offered to / list presented to : Spouse  Discharge Placement              Patient chooses bed at: [facility] Patient to be transferred to facility by: [place] Name of family member notified: Wife at the bedside Patient and family notified of of transfer: 06/16/21  Discharge Plan and Services     Post Acute Care Choice: [place]                               Social Determinants of Health ([place]) Interventions     Readmission Risk Interventions Readmission Risk Prevention Plan 10/09/2020  Medication Screening Complete  Transportation Screening Complete  Some recent data might be hidden

## 2021-06-16 NOTE — Plan of Care (Signed)
  Problem: Education: Goal: Knowledge of disease or condition will improve Outcome: Adequate for Discharge Goal: Knowledge of secondary prevention will improve Outcome: Adequate for Discharge Goal: Knowledge of patient specific risk factors addressed and post discharge goals established will improve Outcome: Adequate for Discharge Goal: Individualized Educational Video(s) Outcome: Adequate for Discharge   Problem: Coping: Goal: Will identify appropriate support needs Outcome: Adequate for Discharge   Problem: Health Behavior/Discharge Planning: Goal: Ability to manage health-related needs will improve Outcome: Adequate for Discharge   Problem: Self-Care: Goal: Ability to participate in self-care as condition permits will improve Outcome: Adequate for Discharge Goal: Verbalization of feelings and concerns over difficulty with self-care will improve Outcome: Adequate for Discharge Goal: Ability to communicate needs accurately will improve Outcome: Adequate for Discharge   Problem: Nutrition: Goal: Risk of aspiration will decrease Outcome: Adequate for Discharge Goal: Dietary intake will improve Outcome: Adequate for Discharge   Problem: Intracerebral Hemorrhage Tissue Perfusion: Goal: Complications of Intracerebral Hemorrhage will be minimized Outcome: Adequate for Discharge   Problem: Education: Goal: Knowledge of General Education information will improve Description: Including pain rating scale, medication(s)/side effects and non-pharmacologic comfort measures Outcome: Adequate for Discharge   Problem: Health Behavior/Discharge Planning: Goal: Ability to manage health-related needs will improve Outcome: Adequate for Discharge   Problem: Clinical Measurements: Goal: Ability to maintain clinical measurements within normal limits will improve Outcome: Adequate for Discharge Goal: Will remain free from infection Outcome: Adequate for Discharge Goal: Diagnostic test  results will improve Outcome: Adequate for Discharge Goal: Respiratory complications will improve Outcome: Adequate for Discharge Goal: Cardiovascular complication will be avoided Outcome: Adequate for Discharge   Problem: Activity: Goal: Risk for activity intolerance will decrease Outcome: Adequate for Discharge   Problem: Nutrition: Goal: Adequate nutrition will be maintained Outcome: Adequate for Discharge   Problem: Coping: Goal: Level of anxiety will decrease Outcome: Adequate for Discharge   Problem: Elimination: Goal: Will not experience complications related to bowel motility Outcome: Adequate for Discharge Goal: Will not experience complications related to urinary retention Outcome: Adequate for Discharge   Problem: Pain Managment: Goal: General experience of comfort will improve Outcome: Adequate for Discharge   Problem: Safety: Goal: Ability to remain free from injury will improve Outcome: Adequate for Discharge   Problem: Skin Integrity: Goal: Risk for impaired skin integrity will decrease Outcome: Adequate for Discharge

## 2021-06-16 NOTE — Progress Notes (Signed)
Physical Therapy Treatment Patient Details Name: Casey Greene MRN: [id number] DOB: 1951-03-01 Today's Date: 06/16/2021    History of Present Illness 16 male admitted to [facility] on 7/3 with R thalamic hemorrhage with intraventricular extension and hydrocephalus, no plan for neurosurgical intervention. PMH includes CKD stage III, hyperlipidemia, essential hypertension, LVH, mild neurocognitive disorder on Aricept, peripheral vascular disease, chronic diastolic heart failure, ACD, type 2 diabetes mellitus, and remote strokes identified on MRI in January 2022- remote pontine and bilateral thalamic infarcts and subacute infarcts identified on imaging in June 2022 on aspirin and clopidogrel.    PT Comments    Pt progressing slowly physical therapy goals. +2 total assist required for all aspects of mobility at this time. Pt with eyes open more frequently this session, however overall still very lethargic and not able to participate with attempted activity. Pt did attempt to wipe his mouth one time when given a washcloth, however max assist was provided to get the washcloth to his mouth. Wife present at end of session and questions were answered regarding therapy at the SNF level. She reports he will be transferring to SNF today, however nothing definitive noted in the chart as of yet. Will continue to follow and progress as able per POC.    Follow Up Recommendations  SNF     Equipment Recommendations  Hospital bed;Wheelchair (measurements PT);Wheelchair cushion (measurements PT) (hoyer lift)    Recommendations for Other Services Rehab consult     Precautions / Restrictions Precautions Precautions: Fall Precaution Comments: BP <140 Restrictions Weight Bearing Restrictions: No    Mobility  Bed Mobility Overal bed mobility: Needs Assistance Bed Mobility: Rolling;Sidelying to Sit;Sit to Sidelying Rolling: +2 for physical assistance;Total assist Sidelying to sit: Total assist;+2 for physical  assistance Supine to sit: +2 for physical assistance;Mod assist Sit to supine: +2 for physical assistance;Total assist Sit to sidelying: Total assist;+2 for physical assistance General bed mobility comments: Multimodal cues for log roll with minimal follow-through noted. Heavy +2 assist for all aspects of bed mobility.    Transfers                 General transfer comment: Deferred OOB mobility  Ambulation/Gait                 Stairs             Wheelchair Mobility    Modified Rankin (Stroke Patients Only) Modified Rankin (Stroke Patients Only) Pre-Morbid Rankin Score: Slight disability Modified Rankin: Severe disability     Balance Overall balance assessment: Needs assistance Sitting-balance support: Feet supported;Bilateral upper extremity supported Sitting balance-Leahy Scale: Poor Sitting balance - Comments: Up to max assist required to hold midline posture       Standing balance comment: Did not assess this session.                            Cognition Arousal/Alertness: Lethargic Behavior During Therapy: Flat affect Overall Cognitive Status: Difficult to assess                                 General Comments: pt briefly opens eyes throughout session, occasionally attempts to speak, and then returns to eyes closed.      Exercises Other Exercises Other Exercises: Attempted to facilitate pt washing face/wiping mouth with warm washcloth. Max assist required and pt did attempt to wipe mouth once but otherwise no  participation. Other Exercises: Trunk rotation stretch R and L with Pec stretch Other Exercises: RLE knee extension - essentially PROM. No quad activation noted.    General Comments        Pertinent Vitals/Pain Pain Assessment: Faces Faces Pain Scale: No hurt Pain Location: unable to indicate specific location Pain Descriptors / Indicators: Grimacing Pain Intervention(s): Limited activity within  patient's tolerance;Monitored during session;Repositioned    Home Living                      Prior Function            PT Goals (current goals can now be found in the care plan section) Acute Rehab PT Goals Patient Stated Goal: did not state, lethargic PT Goal Formulation: Patient unable to participate in goal setting Time For Goal Achievement: 06/28/21 Potential to Achieve Goals: Fair Progress towards PT goals: Not progressing toward goals - comment (lethargy limiting progress)    Frequency    Min 2X/week      PT Plan Current plan remains appropriate    Co-evaluation              AM-PAC PT "6 Clicks" Mobility   Outcome Measure  Help needed turning from your back to your side while in a flat bed without using bedrails?: Total Help needed moving from lying on your back to sitting on the side of a flat bed without using bedrails?: Total Help needed moving to and from a bed to a chair (including a wheelchair)?: Total Help needed standing up from a chair using your arms (e.g., wheelchair or bedside chair)?: Total Help needed to walk in hospital room?: Total Help needed climbing 3-5 steps with a railing? : Total 6 Click Score: 6    End of Session Equipment Utilized During Treatment:  (bed pad) Activity Tolerance: Patient limited by lethargy Patient left: in bed;with call bell/phone within reach;with bed alarm set Nurse Communication: Mobility status PT Visit Diagnosis: Muscle weakness (generalized) (M62.81);Other symptoms and signs involving the nervous system (R29.898)     Time: 4765-4650 PT Time Calculation (min) (ACUTE ONLY): 24 min  Charges:  $Therapeutic Activity: 23-37 mins                     Rolinda Roan, PT, DPT Acute Rehabilitation Services Pager: [phone] Office: [phone]    Thelma Comp 06/16/2021, 1:59 PM

## 2021-06-16 NOTE — Progress Notes (Signed)
Discharged via [place]. Wife notified that is being transported at this time.

## 2021-06-16 NOTE — Discharge Summary (Signed)
Physician Discharge Summary  Casey Greene ZGY:[id number] DOB: [birth year] DOA: 05/28/2021  PCP: [name], MD  Admit date: 05/28/2021 Discharge date: 06/16/2021  Recommendations for Outpatient Follow-up:   * ICH (intracerebral hemorrhage)   Dysphagia -- Status post PEG tube placement 7/15, continue tube feeds.  Hypercalcemia --appears stable this admission, intermittent, not on any offending medications.  Suspect related to acute stroke.  Given fluctuation, suspect benign.  Discussed with wife.  Recommend repeat BMP in 1 week.  If remains elevated, consider further work-up. --Given fluctuation here and normal values at times, I do not think this is contributing to his mental status.   Contact information for follow-up providers     [name], MD. Go on 06/19/2021.   Specialty: Neurology Why: as scheduled Contact information: [place] [address] [phone]         [name], [name]. Go on 10/06/2021.   Specialty: Neurology Why: As scheduled Contact information: [address] [place] [address] [phone]              Contact information for after-discharge care     [facility] .   Service: Skilled Nursing Contact information: [address] [place] [phone]                      Discharge Diagnoses: Principal diagnosis is #1 Principal Problem:   ICH (intracerebral hemorrhage) ([place]) Active Problems:   Dysphagia   AKI (acute kidney injury) ([place])   Hypercalcemia   Essential hypertension   Anemia of chronic renal failure   Severe protein-calorie malnutrition ([place])   Hyponatremia   Acute hypoxemic respiratory failure ([place])   Discharge Condition: stable Disposition: SNF  Diet recommendation:   NPO  Filed Weights   06/14/21 0347 06/15/21 0402 06/16/21 0500  Weight: 76.8 kg 73 kg 73 kg    HPI/Hospital Course:    70 year old man PMH mild neurocognitive dysfunction, CVA, diabetes, CKD presented with left-sided weakness, admitted for acute right thalamic hemorrhagic infarct by neurology to ICU, subsequently transferred to hospitalist service.  Other issues included encephalopathy, concern for aspiration pneumonia treated with Unasyn and dysphagia.  Ultimately PEG tube placed.  Other issues include persistent encephalopathy with waxing and waning mentation.  * ICH (intracerebral hemorrhage) (HCC) acute right thalamic hemorrhage, intraventricular hemorrhage, obstructive hydrocephalus.  Likely hypertensive in etiology.  Associated with metabolic encephalopathy -- Left-sided hemiplegia, dysphagia.  Mentation waxes and wanes. -- SFollow-up with [name] at [facility] on 06/19/21 and V[facility] PA at [facility] on 10/06/21.  --No anticoagulation or antiplatelet agents given bleeding. --Given ongoing lethargy-aspiration issues/accumulation of secretions-with lack of any significant improvement-he remains with poor prognosis-and at risk for further decompensation. Palliative follow at SNF. Hypertensive emergency -- Treated with Cleviprex  Dysphagia -- Status post PEG tube placement 7/15, continue tube feeds.  AKI (acute kidney injury) ([place]) -- Superimposed on CKD stage IIIb.  Baseline appears to be 2-2.2.  Creatinine stable, back to baseline  Hypercalcemia --appears stable this admission, intermittent, not on any offending medications.  Suspect related to acute stroke.  Given fluctuation, suspect benign.  Discussed with wife.  Recommend repeat BMP in 1 week.  If remains elevated, consider further work-up. --Given fluctuation here and normal values at times, I do not think this is contributing to his mental status.  Acute hypoxemic respiratory failure (HCC) -- resolved -- secondary to microaspiration, presumed aspiration pneumonia --  Treated with 7 days of Unasyn per ID.  Hyponatremia --resolved  Severe protein-calorie  malnutrition ([place]) --continue management per dietician  Anemia of chronic renal failure --stable  Essential hypertension --stable. Continue carvedilol, hydralazine.   mild neurocognitive disorder and placed on Aricept  Significant Hospital Events   6/12>> TTE: EF 09-38%, grade 2 diastolic dysfunction. 7/3>> acute right thalamic hemorrhage with intraventricular extension and hydrocephalus. 7/3>> CT head: Unchanged size of intraparenchymal hemorrhage 7/4>> MRI brain: 2 small acute infarct in rt parietal lobe, acute right thalamic hemorrhage with intraventricular extension. 7/4>> MRA brain: No significant stenosis or aneurysm 7/4>> MRA neck: No significant stenosis 7/4>> LDL: 50 7/4>> A1c: 5.1 7/7>> CXR: No pneumonia 7/8>> CXR: No pneumonia 7/9>> CT chest: Some scattered secretions in the central trachea/right mainstem bronchus-lungs are clear. 7/9>> CT abdomen: Diffusely fluid-filled appearance of the colon with lack of formed stools, complex multilobulated ventral hernia.  7/12>> CT head: Decreased right thalamic/intraventricular hemorrhage since last week 7/12>> CXR: No pneumonia 7/12 >PCCM consult for ongoing aspiration.   06/07/21 patient remains lethargic 06/09/21 Mental Status is improving a little but still lethargic and PEG Tube placed 06/10/21 TF started through PEG  Consults:  Admitted by neurology Neurosurgery  Palliative medicine  ID IR PCCM   Procedures:  7/12 EEG IMPRESSION: This study is suggestive of moderate diffuse encephalopathy, nonspecific etiology. No seizures or epileptiform discharges were seen throughout the recording. 7/15 PEG tube  Today's assessment: S: CC: f/u stroke  Wife at bedside Pt seems to feel ok Responds with one word No new issues documented  O: Vitals:  Vitals:   06/16/21 1000 06/16/21 1156  BP: (!) 148/87 140/69  Pulse:  68  Resp:  14  Temp:  98 F (36.7 C)  SpO2:  100%    Constitutional:  Appears calm and  comfortable ENMT:  grossly normal hearing  Respiratory:  CTA bilaterally, no w/r/r.  Respiratory effort normal.  Cardiovascular:  RRR, no m/r/g No LE extremity edema   Abdomen:  PEG in place Musculoskeletal:  Moves right arm spontaneously Psychiatric:  Mental status Mood, affect without change, somnolent, responds briefly at times CBG stable  BUN down to 65 Creatinine stable 2 Calcium fluctuating from to 10-11   Discharge Instructions   Allergies as of 06/16/2021       Reactions   Amlodipine Other (See Comments)   headache   Tamsulosin Hcl         Medication List     STOP taking these medications    aspirin EC 81 MG tablet   clopidogrel 75 MG tablet Commonly known as: PLAVIX   ferrous sulfate 325 (65 FE) MG tablet   furosemide 40 MG tablet Commonly known as: LASIX   losartan 50 MG tablet Commonly known as: COZAAR   OVER THE COUNTER MEDICATION   oxybutynin 5 MG tablet Commonly known as: DITROPAN   sucralfate 1 GM/10ML suspension Commonly known as: Carafate   vitamin C 500 MG tablet Commonly known as: ASCORBIC ACID       TAKE these medications    atorvastatin 40 MG tablet Commonly known as: LIPITOR Place 1 tablet (40 mg total) into feeding tube daily. Start taking on: June 17, 2021 What changed:  how to take this when to take this   carvedilol 25 MG tablet Commonly known as: COREG 1 tablet (25 mg total) by Per NG tube route 2 (two) times daily with a meal. What changed:  medication strength how much to take how to take this  donepezil 10 MG tablet Commonly known as: ARICEPT Place 1 tablet (10 mg total) into feeding tube in the morning. What changed: how to take this   feeding supplement (PROSource TF) liquid Place 45 mLs into feeding tube 2 (two) times daily.   feeding supplement (OSMOLITE 1.5 CAL) Liqd Place 1,000 mLs into feeding tube continuous.   free water Soln Place 400 mLs into feeding tube every 8 (eight) hours.    hydrALAZINE 100 MG tablet Commonly known as: APRESOLINE Place 1 tablet (100 mg total) into feeding tube 3 (three) times daily. What changed: how to take this   iron polysaccharides 150 MG capsule Commonly known as: NIFEREX Place 1 capsule (150 mg total) into feeding tube daily. Start taking on: June 17, 2021   multivitamin with minerals Tabs tablet Place 1 tablet into feeding tube daily. Start taking on: June 17, 2021   pantoprazole sodium 40 mg/20 mL Pack Commonly known as: PROTONIX Place 20 mLs (40 mg total) into feeding tube at bedtime.   senna-docusate 8.6-50 MG tablet Commonly known as: Senokot-S Place 1 tablet into feeding tube 2 (two) times daily.       Allergies  Allergen Reactions   Amlodipine Other (See Comments)    headache   Tamsulosin Hcl     The results of significant diagnostics from this hospitalization (including imaging, microbiology, ancillary and laboratory) are listed below for reference.    Significant Diagnostic Studies: CT ABDOMEN PELVIS WO CONTRAST  Result Date: 06/03/2021 CLINICAL DATA:  Fever of unknown origin. 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 EXAM: CT CHEST, ABDOMEN AND PELVIS WITHOUT CONTRAST TECHNIQUE: Multidetector CT imaging of the chest, abdomen and pelvis was performed following the standard protocol without IV contrast. COMPARISON:  Chest radiograph 06/02/2021, PET CT 10/25/2020 FINDINGS: CT CHEST FINDINGS Cardiovascular: Assessment limited in the absence of contrast media. Hypoattenuation of the cardiac blood pool compatible with mild anemia. Few scattered coronary artery calcifications. Normal cardiac size. No significant pericardial effusion. Atherosclerotic plaque within the normal caliber aorta. No hyperdense mural thickening or plaque displacement. No periaortic stranding. Shared origin of the brachiocephalic and left common carotid arteries. Tortuosity of the proximal great vessels. Central pulmonary arteries  are normal caliber. Mediastinum/Nodes: No mediastinal fluid or gas. Normal thyroid gland and thoracic inlet. Scattered secretions noted within the central trachea and right mainstem bronchus. Transesophageal feeding tube in place with the tip positioned appropriately towards the gastric body/antrum. No acute abnormality of the thoracic esophagus. No discernible laryngeal abnormality within the partially included cervical soft tissues. No worrisome mediastinal or axillary adenopathy. Hilar nodal evaluation is limited in the absence of intravenous contrast media. Lungs/Pleura: No concerning pulmonary nodules or masses. Few scattered bandlike opacities in the lung bases, likely atelectasis or scarring. No consolidation, features of edema, pneumothorax, or effusion. Musculoskeletal: No acute osseous abnormality or suspicious osseous lesion. Minimal degenerative changes in the spine and shoulders. CT ABDOMEN PELVIS FINDINGS Hepatobiliary: No worrisome focal liver lesions. Smooth liver surface contour. Normal hepatic attenuation. Normal gallbladder and biliary tree. No visible calcified gallstone. Mild bilateral gynecomastia. No worrisome chest wall mass. Pancreas: No pancreatic ductal dilatation or surrounding inflammatory changes. Spleen: Normal in size. No concerning splenic lesions. Adrenals/Urinary Tract: Normal adrenals. Bilateral fluid attenuation parapelvic renal cysts. No concerning focal renal lesion. Mild cortical thinning and scarring bilaterally. Nonspecific bilateral perinephric stranding. Mild left urothelial thickening. No hydronephrosis. Mild circumferential bladder wall thickening. Stomach/Bowel: Transesophageal feeding tube, as above. Stomach and duodenum are otherwise unremarkable. No small bowel thickening or  dilatation. Fluid-filled appearance of the colon with lack of formed stool, may reflect a rapid transit state. Scattered colonic diverticula without focal inflammation to suggest diverticulitis.  Redemonstration of the ventral midline hernia containing portion of colon. No evidence of mechanical or vascular compromise of the bowel at this time. No other sites of bowel obstruction. Appendix is not visualized. No focal inflammation the vicinity of the cecum to suggest an occult appendicitis. Vascular/Lymphatic: Atherosclerotic calcifications within the abdominal aorta and branch vessels. No aneurysm or ectasia. No enlarged abdominopelvic lymph nodes. Reproductive: Surgical changes noted at the prostate bed. No gross abnormality at the site of prior prostatectomy. Chronic presacral fat stranding. Other: Complex multilobulated ventral hernia containing portion of the transverse colon. Some stranding of the herniated fat contents both within and external to the hernia sac could reflect some fatty strangulation (7/105). Correlate for point tenderness. No evidence of vascular compromise or mechanical obstruction of the herniated bowel loops. No other bowel containing hernia. Mild body wall edema most pronounced towards the flanks, left greater than right. No free abdominopelvic air or fluid. Chronic stranding in the presacral space. Musculoskeletal: The osseous structures appear diffusely demineralized which may limit detection of small or nondisplaced fractures. No acute osseous abnormality or suspicious osseous lesion. Multilevel degenerative changes are present in the imaged portions of the spine. Additional mild degenerative changes in the bilateral hips. IMPRESSION: 1. Scattered secretions noted in the central trachea and right mainstem bronchus. Lungs remain otherwise clear. 2. Diffusely fluid-filled appearance of the colon with lack of formed stool. Could reflect diarrheal illness/rapid transit state. 3. Bilateral perinephric stranding with some urothelial thickening on the left and circumferential bladder wall thickening. Could reflect a cystitis and ascending tract infection in the appropriate clinical  setting. Correlate with urinalysis. 4. Complex multilobulated ventral hernia containing portion of the transverse colon. No evidence of vascular compromise or bowel obstruction though some stranding of the herniated fat contents could reflect fat strangulation. Correlate for point tenderness. 5. Surgical changes from prior prostatectomy. No gross interval change from comparison PET-CT. 6. Coronary artery calcifications. 7. Hypoattenuation of the blood pool suggestive of anemia. Correlate with CBC. 8.  Aortic Atherosclerosis (ICD10-I70.0). Electronically Signed   By: Lovena Le M.D.   On: 06/03/2021 20:36   CT HEAD WO CONTRAST  Result Date: 06/06/2021 CLINICAL DATA:  70 year old male with intracranial hemorrhage. EXAM: CT HEAD WITHOUT CONTRAST TECHNIQUE: Contiguous axial images were obtained from the base of the skull through the vertex without intravenous contrast. COMPARISON:  Head CT 06/03/2021 and earlier. FINDINGS: Brain: Rounded hyperdense hemorrhage in the right thalamus has slightly decreased since 06/03/2021, now approximately 26 mm diameter (versus 28-31 mm diameter previously, coronal image 36 today versus the same coronal image previously). Stable mild regional mass effect. Decreased intraventricular hemorrhage since last week, small volume residual. No ventriculomegaly. No new intracranial hemorrhage. Basilar cisterns remain normal. Stable gray-white matter differentiation elsewhere with patchy white matter hypodensity. No midline shift or evidence of cortically based acute infarction. Vascular: No suspicious intracranial vascular hyperdensity. Skull: No acute osseous abnormality identified. Sinuses/Orbits: Visualized paranasal sinuses and mastoids are stable and well aerated. Other: Left side nasoenteric tube remains in place. Stable orbit and scalp soft tissues. IMPRESSION: 1. Slightly decreased right thalamic and intraventricular hemorrhage since last week. Stable mild regional mass effect. 2.  No new intracranial abnormality. Electronically Signed   By: Genevie Ann M.D.   On: 06/06/2021 09:40   CT HEAD WO CONTRAST  Result Date: 06/03/2021 CLINICAL DATA:  Follow-up cerebral hemorrhage EXAM: CT HEAD WITHOUT CONTRAST TECHNIQUE: Contiguous axial images were obtained from the base of the skull through the vertex without intravenous contrast. COMPARISON:  Head CT from 6 days ago FINDINGS: Brain: Subacute right thalamic hematoma which is decreased in bulk, now 2.3 cm maximal on axial slices. There is intraventricular clot in the lateral ventricles which has diminished, as has ventricular volume. No evidence of cortical infarct or extra-axial collection. Chronic small vessel ischemia in the deep white matter. Vascular: No hyperdense vessel or unexpected calcification. Skull: Normal. Negative for fracture or focal lesion. Sinuses/Orbits: Left cataract resection Other: Subcutaneous dermal inclusion cyst in the left suboccipital region. IMPRESSION: Decreasing right thalamic and intraventricular hemorrhage. Ventriculomegaly has resolved. Electronically Signed   By: Monte Fantasia M.D.   On: 06/03/2021 05:42   CT HEAD WO CONTRAST  Result Date: 05/29/2021 CLINICAL DATA:  Intracranial hemorrhage follow up EXAM: CT HEAD WITHOUT CONTRAST TECHNIQUE: Contiguous axial images were obtained from the base of the skull through the vertex without intravenous contrast. COMPARISON:  05/28/2021 FINDINGS: Brain: Unchanged size of intraparenchymal hematoma centered in the right thalamus. Intraventricular extension of blood is unchanged. Unchanged mild communicating hydrocephalus. No new site of hemorrhage. Vascular: No abnormal hyperdensity of the major intracranial arteries or dural venous sinuses. No intracranial atherosclerosis. Skull: The visualized skull base, calvarium and extracranial soft tissues are normal. Sinuses/Orbits: No fluid levels or advanced mucosal thickening of the visualized paranasal sinuses. No mastoid or  middle ear effusion. The orbits are normal. IMPRESSION: 1. Unchanged size of intraparenchymal hematoma centered in the right thalamus with intraventricular extension. 2. Unchanged mild communicating hydrocephalus. Electronically Signed   By: Ulyses Jarred M.D.   On: 05/29/2021 00:10   CT CHEST WO CONTRAST  Result Date: 06/03/2021 CLINICAL DATA:  Fever of unknown origin. 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 EXAM: CT CHEST, ABDOMEN AND PELVIS WITHOUT CONTRAST TECHNIQUE: Multidetector CT imaging of the chest, abdomen and pelvis was performed following the standard protocol without IV contrast. COMPARISON:  Chest radiograph 06/02/2021, PET CT 10/25/2020 FINDINGS: CT CHEST FINDINGS Cardiovascular: Assessment limited in the absence of contrast media. Hypoattenuation of the cardiac blood pool compatible with mild anemia. Few scattered coronary artery calcifications. Normal cardiac size. No significant pericardial effusion. Atherosclerotic plaque within the normal caliber aorta. No hyperdense mural thickening or plaque displacement. No periaortic stranding. Shared origin of the brachiocephalic and left common carotid arteries. Tortuosity of the proximal great vessels. Central pulmonary arteries are normal caliber. Mediastinum/Nodes: No mediastinal fluid or gas. Normal thyroid gland and thoracic inlet. Scattered secretions noted within the central trachea and right mainstem bronchus. Transesophageal feeding tube in place with the tip positioned appropriately towards the gastric body/antrum. No acute abnormality of the thoracic esophagus. No discernible laryngeal abnormality within the partially included cervical soft tissues. No worrisome mediastinal or axillary adenopathy. Hilar nodal evaluation is limited in the absence of intravenous contrast media. Lungs/Pleura: No concerning pulmonary nodules or masses. Few scattered bandlike opacities in the lung bases, likely atelectasis or scarring. No  consolidation, features of edema, pneumothorax, or effusion. Musculoskeletal: No acute osseous abnormality or suspicious osseous lesion. Minimal degenerative changes in the spine and shoulders. CT ABDOMEN PELVIS FINDINGS Hepatobiliary: No worrisome focal liver lesions. Smooth liver surface contour. Normal hepatic attenuation. Normal gallbladder and biliary tree. No visible calcified gallstone. Mild bilateral gynecomastia. No worrisome chest wall mass. Pancreas: No pancreatic ductal dilatation or surrounding inflammatory changes. Spleen: Normal in size. No concerning splenic lesions. Adrenals/Urinary Tract: Normal adrenals. Bilateral  fluid attenuation parapelvic renal cysts. No concerning focal renal lesion. Mild cortical thinning and scarring bilaterally. Nonspecific bilateral perinephric stranding. Mild left urothelial thickening. No hydronephrosis. Mild circumferential bladder wall thickening. Stomach/Bowel: Transesophageal feeding tube, as above. Stomach and duodenum are otherwise unremarkable. No small bowel thickening or dilatation. Fluid-filled appearance of the colon with lack of formed stool, may reflect a rapid transit state. Scattered colonic diverticula without focal inflammation to suggest diverticulitis. Redemonstration of the ventral midline hernia containing portion of colon. No evidence of mechanical or vascular compromise of the bowel at this time. No other sites of bowel obstruction. Appendix is not visualized. No focal inflammation the vicinity of the cecum to suggest an occult appendicitis. Vascular/Lymphatic: Atherosclerotic calcifications within the abdominal aorta and branch vessels. No aneurysm or ectasia. No enlarged abdominopelvic lymph nodes. Reproductive: Surgical changes noted at the prostate bed. No gross abnormality at the site of prior prostatectomy. Chronic presacral fat stranding. Other: Complex multilobulated ventral hernia containing portion of the transverse colon. Some stranding  of the herniated fat contents both within and external to the hernia sac could reflect some fatty strangulation (7/105). Correlate for point tenderness. No evidence of vascular compromise or mechanical obstruction of the herniated bowel loops. No other bowel containing hernia. Mild body wall edema most pronounced towards the flanks, left greater than right. No free abdominopelvic air or fluid. Chronic stranding in the presacral space. Musculoskeletal: The osseous structures appear diffusely demineralized which may limit detection of small or nondisplaced fractures. No acute osseous abnormality or suspicious osseous lesion. Multilevel degenerative changes are present in the imaged portions of the spine. Additional mild degenerative changes in the bilateral hips. IMPRESSION: 1. Scattered secretions noted in the central trachea and right mainstem bronchus. Lungs remain otherwise clear. 2. Diffusely fluid-filled appearance of the colon with lack of formed stool. Could reflect diarrheal illness/rapid transit state. 3. Bilateral perinephric stranding with some urothelial thickening on the left and circumferential bladder wall thickening. Could reflect a cystitis and ascending tract infection in the appropriate clinical setting. Correlate with urinalysis. 4. Complex multilobulated ventral hernia containing portion of the transverse colon. No evidence of vascular compromise or bowel obstruction though some stranding of the herniated fat contents could reflect fat strangulation. Correlate for point tenderness. 5. Surgical changes from prior prostatectomy. No gross interval change from comparison PET-CT. 6. Coronary artery calcifications. 7. Hypoattenuation of the blood pool suggestive of anemia. Correlate with CBC. 8.  Aortic Atherosclerosis (ICD10-I70.0). Electronically Signed   By: Lovena Le M.D.   On: 06/03/2021 20:36   MR ANGIO HEAD WO CONTRAST  Result Date: 05/29/2021 CLINICAL DATA:  Intracranial hemorrhage, code  stroke follow-up EXAM: MRI HEAD WITHOUT AND WITH CONTRAST MRA HEAD WITHOUT CONTRAST MRA NECK WITHOUT CONTRAST TECHNIQUE: Multiplanar, multiecho pulse sequences of the brain and surrounding structures were obtained without and with intravenous contrast. Angiographic images of the Circle of Willis were obtained using MRA technique without intravenous contrast. Angiographic images of the neck were obtained using MRA technique without intravenous contrast. Carotid stenosis measurements (when applicable) are obtained utilizing NASCET criteria, using the distal internal carotid diameter as the denominator. CONTRAST:  25mL GADAVIST GADOBUTROL 1 MMOL/ML IV SOLN COMPARISON:  05/05/2021, 04/30/2021 MRA neck FINDINGS: MRI HEAD Brain: Acute right thalamic hemorrhage seen on recent CT imaging is again identified with surrounding edema. Intraventricular extension is again noted. There is effacement of the third ventricle. Similar ventricle caliber with hydrocephalus. No abnormal enhancement to suggest underlying lesion. Two small foci of reduced diffusion in the  inferior right parietal lobe. Stable findings of chronic microvascular ischemic changes. Multiple chronic small vessel infarcts are again identified with involvement of central cerebral white matter, deep gray nuclei, and pons. Numerous foci of susceptibility hypointensity are also again noted in the cerebral white matter, basal ganglia, thalami, and cerebellum consistent with chronic microhemorrhages in a distribution suggesting hypertension as the etiology. There is no abnormal enhancement. Vascular: Major vessel flow voids at the skull base are preserved. Skull and upper cervical spine: Normal marrow signal is preserved. Sinuses/Orbits: Minor mucosal thickening.  Orbits are unremarkable. Other: Sella is unremarkable.  Mastoid air cells are clear. MRA HEAD Intracranial internal carotid arteries are patent. Middle and anterior cerebral arteries are patent. Intracranial  vertebral arteries, basilar artery, posterior cerebral arteries are patent. Bilateral posterior communicating arteries are present. There is no significant stenosis or aneurysm. MRA NECK Common, internal, and external carotid arteries are patent. Codominant vertebral arteries are patent. There is focal moderate narrowing of the right V2 vertebral artery due to osteophytic spurring. No hemodynamically significant stenosis. IMPRESSION: Two small acute infarcts of the inferior right parietal lobe. Acute right thalamic hemorrhage with intraventricular extension, mild mass effect, and hydrocephalus similar to recent CT imaging. Likely hypertensive in etiology with multiple prior chronic microhemorrhage is noted. Stable vascular imaging. Electronically Signed   By: Macy Mis M.D.   On: 05/29/2021 14:15   MR ANGIO NECK WO CONTRAST  Result Date: 05/29/2021 CLINICAL DATA:  Intracranial hemorrhage, code stroke follow-up EXAM: MRI HEAD WITHOUT AND WITH CONTRAST MRA HEAD WITHOUT CONTRAST MRA NECK WITHOUT CONTRAST TECHNIQUE: Multiplanar, multiecho pulse sequences of the brain and surrounding structures were obtained without and with intravenous contrast. Angiographic images of the Circle of Willis were obtained using MRA technique without intravenous contrast. Angiographic images of the neck were obtained using MRA technique without intravenous contrast. Carotid stenosis measurements (when applicable) are obtained utilizing NASCET criteria, using the distal internal carotid diameter as the denominator. CONTRAST:  12mL GADAVIST GADOBUTROL 1 MMOL/ML IV SOLN COMPARISON:  05/05/2021, 04/30/2021 MRA neck FINDINGS: MRI HEAD Brain: Acute right thalamic hemorrhage seen on recent CT imaging is again identified with surrounding edema. Intraventricular extension is again noted. There is effacement of the third ventricle. Similar ventricle caliber with hydrocephalus. No abnormal enhancement to suggest underlying lesion. Two small  foci of reduced diffusion in the inferior right parietal lobe. Stable findings of chronic microvascular ischemic changes. Multiple chronic small vessel infarcts are again identified with involvement of central cerebral white matter, deep gray nuclei, and pons. Numerous foci of susceptibility hypointensity are also again noted in the cerebral white matter, basal ganglia, thalami, and cerebellum consistent with chronic microhemorrhages in a distribution suggesting hypertension as the etiology. There is no abnormal enhancement. Vascular: Major vessel flow voids at the skull base are preserved. Skull and upper cervical spine: Normal marrow signal is preserved. Sinuses/Orbits: Minor mucosal thickening.  Orbits are unremarkable. Other: Sella is unremarkable.  Mastoid air cells are clear. MRA HEAD Intracranial internal carotid arteries are patent. Middle and anterior cerebral arteries are patent. Intracranial vertebral arteries, basilar artery, posterior cerebral arteries are patent. Bilateral posterior communicating arteries are present. There is no significant stenosis or aneurysm. MRA NECK Common, internal, and external carotid arteries are patent. Codominant vertebral arteries are patent. There is focal moderate narrowing of the right V2 vertebral artery due to osteophytic spurring. No hemodynamically significant stenosis. IMPRESSION: Two small acute infarcts of the inferior right parietal lobe. Acute right thalamic hemorrhage with intraventricular extension, mild mass effect,  and hydrocephalus similar to recent CT imaging. Likely hypertensive in etiology with multiple prior chronic microhemorrhage is noted. Stable vascular imaging. Electronically Signed   By: Macy Mis M.D.   On: 05/29/2021 14:15   MR BRAIN WO CONTRAST  Result Date: 06/06/2021 CLINICAL DATA:  Stroke follow-up EXAM: MRI HEAD WITHOUT CONTRAST TECHNIQUE: Multiplanar, multiecho pulse sequences of the brain and surrounding structures were obtained  without intravenous contrast. COMPARISON:  05/29/2021 FINDINGS: Brain: Slightly decreased size of intraparenchymal hematoma centered in the right thalamus. Persistent intraventricular extension of blood products. There are numerous chronic microhemorrhages in a predominantly central distribution. There is mild edema surrounding the hemorrhage. Early confluent hyperintense T2-weighted signal of the periventricular and deep white matter, most commonly due to chronic ischemic microangiopathy. Mild volume loss. Old small vessel infarct of the left pons. The midline structures are normal. Vascular: Major flow voids are preserved. Skull and upper cervical spine: Normal calvarium and skull base. Visualized upper cervical spine and soft tissues are normal. Sinuses/Orbits:No paranasal sinus fluid levels or advanced mucosal thickening. No mastoid or middle ear effusion. Normal orbits. IMPRESSION: 1. Slightly decreased size of right thalamic intraparenchymal hematoma with intraventricular extension. 2. Numerous chronic microhemorrhages in a predominantly central distribution, consistent with chronic hypertensive angiopathy. 3. Chronic ischemic microangiopathy. Electronically Signed   By: Ulyses Jarred M.D.   On: 06/06/2021 22:00   MR BRAIN W WO CONTRAST  Result Date: 05/29/2021 CLINICAL DATA:  Intracranial hemorrhage, code stroke follow-up EXAM: MRI HEAD WITHOUT AND WITH CONTRAST MRA HEAD WITHOUT CONTRAST MRA NECK WITHOUT CONTRAST TECHNIQUE: Multiplanar, multiecho pulse sequences of the brain and surrounding structures were obtained without and with intravenous contrast. Angiographic images of the Circle of Willis were obtained using MRA technique without intravenous contrast. Angiographic images of the neck were obtained using MRA technique without intravenous contrast. Carotid stenosis measurements (when applicable) are obtained utilizing NASCET criteria, using the distal internal carotid diameter as the denominator.  CONTRAST:  9mL GADAVIST GADOBUTROL 1 MMOL/ML IV SOLN COMPARISON:  05/05/2021, 04/30/2021 MRA neck FINDINGS: MRI HEAD Brain: Acute right thalamic hemorrhage seen on recent CT imaging is again identified with surrounding edema. Intraventricular extension is again noted. There is effacement of the third ventricle. Similar ventricle caliber with hydrocephalus. No abnormal enhancement to suggest underlying lesion. Two small foci of reduced diffusion in the inferior right parietal lobe. Stable findings of chronic microvascular ischemic changes. Multiple chronic small vessel infarcts are again identified with involvement of central cerebral white matter, deep gray nuclei, and pons. Numerous foci of susceptibility hypointensity are also again noted in the cerebral white matter, basal ganglia, thalami, and cerebellum consistent with chronic microhemorrhages in a distribution suggesting hypertension as the etiology. There is no abnormal enhancement. Vascular: Major vessel flow voids at the skull base are preserved. Skull and upper cervical spine: Normal marrow signal is preserved. Sinuses/Orbits: Minor mucosal thickening.  Orbits are unremarkable. Other: Sella is unremarkable.  Mastoid air cells are clear. MRA HEAD Intracranial internal carotid arteries are patent. Middle and anterior cerebral arteries are patent. Intracranial vertebral arteries, basilar artery, posterior cerebral arteries are patent. Bilateral posterior communicating arteries are present. There is no significant stenosis or aneurysm. MRA NECK Common, internal, and external carotid arteries are patent. Codominant vertebral arteries are patent. There is focal moderate narrowing of the right V2 vertebral artery due to osteophytic spurring. No hemodynamically significant stenosis. IMPRESSION: Two small acute infarcts of the inferior right parietal lobe. Acute right thalamic hemorrhage with intraventricular extension, mild mass effect, and hydrocephalus  similar  to recent CT imaging. Likely hypertensive in etiology with multiple prior chronic microhemorrhage is noted. Stable vascular imaging. Electronically Signed   By: Macy Mis M.D.   On: 05/29/2021 14:15   IR GASTROSTOMY TUBE MOD SED  Result Date: 06/09/2021 INDICATION: 70 year old male referred for gastrostomy EXAM: PERC PLACEMENT GASTROSTOMY MEDICATIONS: 2 g Ancef; Antibiotics were administered within 1 hour of the procedure. Scratch ANESTHESIA/SEDATION: Versed 1.0 mg IV; Fentanyl 25 mcg IV Moderate Sedation Time:  15 minutes The patient was continuously monitored during the procedure by the interventional radiology nurse under my direct supervision. CONTRAST:  40mL OMNIPAQUE IOHEXOL 300 MG/ML SOLN - administered into the gastric lumen. FLUOROSCOPY TIME:  Fluoroscopy Time: 6 minutes 18 seconds (16 mGy). COMPLICATIONS: 15 cc PROCEDURE: Informed written consent was obtained from the patient and the patient's family after a thorough discussion of the procedural risks, benefits and alternatives. All questions were addressed. Maximal Sterile Barrier Technique was utilized including caps, mask, sterile gowns, sterile gloves, sterile drape, hand hygiene and skin antiseptic. A timeout was performed prior to the initiation of the procedure. The epigastrium was prepped with Betadine in a sterile fashion, and a sterile drape was applied covering the operative field. A sterile gown and sterile gloves were used for the procedure. A 5-French orogastric tube is placed under fluoroscopic guidance. Scout imaging of the abdomen confirms barium within the transverse colon. The stomach was distended with gas. Under fluoroscopic guidance, an 18 gauge needle was utilized to puncture the anterior wall of the body of the stomach. The puncture was made just superficial to a site ventral hernia, at a site where we perceived the most residual muscular tone persisted. An Amplatz wire was advanced through the needle passing a T fastener  into the lumen of the stomach. The T fastener was secured for gastropexy. A 9-French sheath was inserted. A snare was advanced through the 9-French sheath. A Britta Mccreedy was advanced through the orogastric tube. It was snared then pulled out the oral cavity, pulling the snare, as well. The leading edge of the gastrostomy was attached to the snare. It was then pulled down the esophagus and out the percutaneous site. Tube secured in place. Contrast was injected. Patient tolerated the procedure well and remained hemodynamically stable throughout. No complications were encountered and no significant blood loss encountered. IMPRESSION: Status post fluoroscopic placed percutaneous gastrostomy tube, with 20 [place] pull-through. Signed, Dulcy Fanny. Earleen Newport, DO [facility] Electronically Signed   By: Corrie Mckusick D.O.   On: 06/09/2021 17:25   Cardiac event monitor  Result Date: 06/13/2021 Long-term monitor report. Patient is being monitored from 05/19/2021 to 06/06/2021.  Reason for the monitor was to detect atrial fibrillation since patient had history of CVA. Multiple automatically detected episode of ventricular tachycardia noted.  Longest episode was 15 seconds. To trigger events showing sinus rhythm with PVCs.  Symptomatology during the time was lightheadedness. Summary conclusions: Ventricular tachycardia noted. Symptomatic PVCs  DG CHEST PORT 1 VIEW  Result Date: 06/09/2021 CLINICAL DATA:  Shortness of breath, history of stroke EXAM: PORTABLE CHEST 1 VIEW COMPARISON:  06/07/2021 FINDINGS: Mild cardiomegaly. Partially imaged enteric feeding tube. Both lungs are clear. The visualized skeletal structures are unremarkable. IMPRESSION: Mild cardiomegaly. No acute abnormality of the lungs in AP portable projection. Electronically Signed   By: Eddie Candle M.D.   On: 06/09/2021 08:51   DG CHEST PORT 1 VIEW  Result Date: 06/07/2021 CLINICAL DATA:  Shortness of  breath EXAM:  PORTABLE CHEST 1 VIEW COMPARISON:  06/06/2021 FINDINGS: Feeding tube coursing below the diaphragm with the tip excluded from the field of view. No focal consolidation. No pleural effusion or pneumothorax. Heart and mediastinal contours are unremarkable. No acute osseous abnormality. IMPRESSION: No acute cardiopulmonary disease. Electronically Signed   By: Kathreen Devoid   On: 06/07/2021 13:02   DG Chest Port 1 View  Result Date: 06/06/2021 CLINICAL DATA:  70 year old male with shortness of breath. Intracranial hemorrhage. EXAM: PORTABLE CHEST 1 VIEW COMPARISON:  CT Chest, Abdomen, and Pelvis today are reported separately. 06/03/2021 and earlier. FINDINGS: Portable AP upright view at 0826 hours. Enteric feeding tube courses to the abdomen, tip not included. Lung volumes, mediastinal contours are stable and within normal limits. Allowing for portable technique the lungs are clear. No pneumothorax. No acute osseous abnormality identified. Negative visible bowel gas pattern. IMPRESSION: Enteric tube remains in place. No acute cardiopulmonary abnormality. Electronically Signed   By: Genevie Ann M.D.   On: 06/06/2021 08:33   DG Chest Port 1 View  Result Date: 06/02/2021 CLINICAL DATA:  Short of breath EXAM: PORTABLE CHEST 1 VIEW COMPARISON:  None. FINDINGS: Feeding tube extends the stomach. Lungs are clear. No pneumonia, infiltrate pneumothorax IMPRESSION: Lungs are clear Electronically Signed   By: Suzy Bouchard M.D.   On: 06/02/2021 09:06   DG Chest Port 1V same Day  Result Date: 06/01/2021 CLINICAL DATA:  Shortness of breath. EXAM: PORTABLE CHEST 1 VIEW COMPARISON:  May 05, 2021. FINDINGS: The heart size and mediastinal contours are within normal limits. Both lungs are clear. Feeding tube is seen entering stomach. The visualized skeletal structures are unremarkable. IMPRESSION: No active disease. Electronically Signed   By: Marijo Conception M.D.   On: 06/01/2021 11:23   DG Abd Portable  1V  Result Date: 05/30/2021 CLINICAL DATA:  Feeding tube placement. EXAM: PORTABLE ABDOMEN - 1 VIEW COMPARISON:  None. FINDINGS: The bowel gas pattern is normal. Distal tip of feeding tube is seen in expected position of distal stomach. No radio-opaque calculi or other significant radiographic abnormality are seen. IMPRESSION: Distal tip of feeding tube seen in expected position of distal stomach. Electronically Signed   By: Marijo Conception M.D.   On: 05/30/2021 15:32   DG Swallowing Func-Speech Pathology  Result Date: 06/08/2021 Formatting of this result is different from the original. Objective Swallowing Evaluation: Type of Study: MBS-Modified Barium Swallow Study  Patient Details Name: Smokey Melott MRN: [id number] Date of Birth: Jan 27, 1951 Today's Date: 06/08/2021 Time: SLP Start Time (ACUTE ONLY): 1422 -SLP Stop Time (ACUTE ONLY): 1435 SLP Time Calculation (min) (ACUTE ONLY): 13 min Past Medical History: Past Medical History: Diagnosis Date  Anemia of chronic disease 03/28/2018  Ascending aorta dilation 07/29/2019  (a) Echo 07/29/19 27mm  Bleeding per rectum 12/09/2018  CKD (chronic kidney disease) stage 3, GFR 30-59 ml/min (K[place]leen) 10/13/2014  Discussed with patient importance of good BP control, avoiding nephrotoxic meds/supplements in preventing further progression  Constipation 12/01/2018  Corn or callus 06/11/2019  Decreased appetite   Diastolic congestive heart failure ([place]) 02/03/2021  DOE (dyspnea on exertion) 06/17/2020  Dysrhythmia   Edema 07/09/2016  Elevated hemoglobin A1c 06/11/2019  Essential hypertension 06/09/2014  Overview:  metoprolol caused bradycardia amlodipine causes headache, leg swelling  Last Assessment & Plan:  We had a long discussion regarding goals of treatment of HTN to prevent worsening cardiac and renal disease.  We also discussed options to change his regimen.  He states that he would prefer to  seek a practitioner who can recommend herbal remedies as he feels thhis will be a  healthier path f  Hepatitis C virus infection cured after antiviral drug therapy 07/05/2014  History of gout   History of urinary frequency 12/05/2020  Hyperlipidemia 06/29/2014  Hypokalemia 12/09/2018  Laryngeal mass 06/17/2020  Localized osteoarthritis of left knee 06/11/2020  Left knee injection-06/10/2020  LVH (left ventricular hypertrophy) due to hypertensive disease   (a) echo 07/29/19 severe concentric LVH  Major depressive disorder 02/27/2016  Mass of thyroid region 03/28/2018  Mild neurocognitive disorder due to multiple etiologies 12/30/2020  Nonsustained ventricular tachycardia 07/23/2019  (a) Zio monitor 06/2019. Subsequent echo with severe concentric LVH, but normal EF 55-60%  Onychomycosis 12/01/2013  PAC (premature atrial contraction)   (a) ZIO 06/2019. Asymptomatic  Prostate cancer 1997  Dx in 1997, never treated, taking herbal supplements. S/p prostatectomy 2017  PVD (peripheral vascular disease) 12/01/2013  Stroke   Per 12/26/20 MRI - remote pontine and bilateral thalamic infarcts   Urinary retention  Past Surgical History: Past Surgical History: Procedure Laterality Date  ACHILLES TENDON REPAIR    CERVICAL DISC SURGERY    neck  COLONOSCOPY    before 2010   LYMPHADENECTOMY Bilateral 01/26/2016  Procedure: EXTENDED BILATERAL PELVIC LYMPHADENECTOMY;  Surgeon: Raynelle Bring, MD;  Location: [facility];  Service: Urology;  Laterality: Bilateral;  NOSE SURGERY    in high school-nasal fracturex2  ROBOT ASSISTED LAPAROSCOPIC RADICAL PROSTATECTOMY N/A 01/26/2016  Procedure: XI ROBOTIC ASSISTED LAPAROSCOPIC RADICAL PROSTATECTOMY LEVEL 3;  Surgeon: Raynelle Bring, MD;  Location: [facility];  Service: Urology;  Laterality: N/A; HPI: 70 yr old admitted with LUE and LLE weakness. CT revealed acute right thalamic hemorrhage. 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), DM, mild neurocognitive disorder d/t multiple etiologies, prostate cancer, pontine/bil thalamic infarcts 12/26/20  Subjective: requires cueing, fluctuating  alertness Assessment / Plan / Recommendation CHL IP CLINICAL IMPRESSIONS 06/08/2021 Clinical Impression Pt presents with a severe oral and moderate pharyngeal dysphagia likely of multifactorial etiology including recent CVA with fluctuating mentation. While pt was able to arouse with cueing, his overall sustained attention was diminished as compared to clinical interaction this am which suspected to contribute to worsened outcome. Pt with poor labial seal with diffuse oral residuals (worst with thicker POs), anterior sulcus pocketing, decreased bolus cohesion, reduced posterior bolus propulsion, and frequent anterior spillage. Pt with mistimed epiglottic deflection, reduced pharyngeal stripping wave, poor base of tongue retraction, and diminished laryngeal sensation. This allowed for intermittent during the swallow laryngeal penetration of nectar thick liquids and post swallow spillage of residuals with all consistencies (thin, nectar, honey thick, and puree; (PAS- 3,5). Pt not able to consistently implement throat clear, cough, or volitional swallow to aid airway protection. While no appreciable aspiration exhibited during exam, pt very high risk for aspiration across a meal. Per MD report, pts family not ready for comfort diet with known aspiration risk. Pt to remain NPO with continued ST intervention and will likely pursue PEG. SLP to continue to follow. SLP Visit Diagnosis Dysphagia, oropharyngeal phase (R13.12) Attention and concentration deficit following -- Frontal lobe and executive function deficit following -- Impact on safety and function Moderate aspiration risk;Severe aspiration risk   CHL IP TREATMENT RECOMMENDATION 06/08/2021 Treatment Recommendations Therapy as outlined in treatment plan below   Prognosis 06/08/2021 Prognosis for Safe Diet Advancement Good Barriers to Reach Goals Time post onset;Cognitive deficits Barriers/Prognosis Comment -- CHL IP DIET RECOMMENDATION 06/08/2021 SLP Diet  Recommendations NPO Liquid Administration via -- Medication  Administration Via alternative means Compensations -- Postural Changes --   CHL IP OTHER RECOMMENDATIONS 06/08/2021 Recommended Consults -- Oral Care Recommendations Oral care QID Other Recommendations --   CH[facility] IP FOLLOW UP RECOMMENDATIONS 06/08/2021 Follow up Recommendations Inpatient Rehab;24 hour supervision/assistance   CHL IP FREQUENCY AND DURATION 06/08/2021 Speech Therapy Frequency (ACUTE ONLY) min 2x/week Treatment Duration 2 weeks      CHL IP ORAL PHASE 06/08/2021 Oral Phase Impaired Oral - Pudding Teaspoon -- Oral - Pudding Cup -- Oral - Honey Teaspoon Left anterior bolus loss;Right anterior bolus loss;Impaired mastication;Weak lingual manipulation;Incomplete tongue to palate contact;Reduced posterior propulsion;Holding of bolus;Pocketing in anterior sulcus;Lingual/palatal residue;Delayed oral transit;Decreased bolus cohesion Oral - Honey Cup Left anterior bolus loss;Right anterior bolus loss;Impaired mastication;Weak lingual manipulation;Incomplete tongue to palate contact;Reduced posterior propulsion;Holding of bolus;Pocketing in anterior sulcus;Lingual/palatal residue;Delayed oral transit;Decreased bolus cohesion Oral - Nectar Teaspoon -- Oral - Nectar Cup Left anterior bolus loss;Right anterior bolus loss;Weak lingual manipulation;Reduced posterior propulsion;Holding of bolus;Pocketing in anterior sulcus;Lingual/palatal residue;Delayed oral transit;Decreased bolus cohesion Oral - Nectar Straw -- Oral - Thin Teaspoon -- Oral - Thin Cup Delayed oral transit;Left anterior bolus loss;Right anterior bolus loss;Weak lingual manipulation;Incomplete tongue to palate contact;Reduced posterior propulsion;Holding of bolus;Pocketing in anterior sulcus;Lingual/palatal residue Oral - Thin Straw -- Oral - Puree Delayed oral transit;Decreased bolus cohesion;Weak lingual manipulation;Lingual pumping;Reduced posterior propulsion;Holding of bolus;Lingual/palatal  residue;Pocketing in anterior sulcus Oral - Mech Soft -- Oral - Regular -- Oral - Multi-Consistency -- Oral - Pill -- Oral Phase - Comment --  CHL IP PHARYNGEAL PHASE 06/08/2021 Pharyngeal Phase Impaired Pharyngeal- Pudding Teaspoon -- Pharyngeal -- Pharyngeal- Pudding Cup -- Pharyngeal -- Pharyngeal- Honey Teaspoon Delayed swallow initiation-vallecula;Reduced tongue base retraction;Pharyngeal residue - valleculae Pharyngeal -- Pharyngeal- Honey Cup Delayed swallow initiation-vallecula;Reduced pharyngeal peristalsis;Reduced epiglottic inversion;Reduced tongue base retraction;Penetration/Apiration after swallow;Pharyngeal residue - pyriform;Pharyngeal residue - valleculae;Reduced laryngeal elevation Pharyngeal Material enters airway, remains ABOVE vocal cords and not ejected out;Material enters airway, CONTACTS cords and not ejected out Pharyngeal- Nectar Teaspoon -- Pharyngeal -- Pharyngeal- Nectar Cup Penetration/Aspiration during swallow;Reduced laryngeal elevation;Reduced airway/laryngeal closure;Reduced tongue base retraction;Reduced pharyngeal peristalsis;Reduced epiglottic inversion;Reduced anterior laryngeal mobility;Pharyngeal residue - valleculae;Pharyngeal residue - pyriform Pharyngeal Material enters airway, remains ABOVE vocal cords and not ejected out;Material enters airway, CONTACTS cords and not ejected out Pharyngeal- Nectar Straw -- Pharyngeal -- Pharyngeal- Thin Teaspoon -- Pharyngeal -- Pharyngeal- Thin Cup Reduced epiglottic inversion;Reduced pharyngeal peristalsis;Reduced anterior laryngeal mobility;Reduced laryngeal elevation;Reduced airway/laryngeal closure;Reduced tongue base retraction;Penetration/Apiration after swallow;Pharyngeal residue - valleculae;Pharyngeal residue - pyriform Pharyngeal Material enters airway, remains ABOVE vocal cords and not ejected out;Material enters airway, CONTACTS cords and not ejected out Pharyngeal- Thin Straw -- Pharyngeal -- Pharyngeal- Puree Delayed swallow  initiation-vallecula;Reduced pharyngeal peristalsis;Reduced epiglottic inversion;Reduced anterior laryngeal mobility;Reduced laryngeal elevation;Reduced tongue base retraction;Penetration/Apiration after swallow;Pharyngeal residue - valleculae;Pharyngeal residue - pyriform Pharyngeal Material enters airway, remains ABOVE vocal cords and not ejected out;Material enters airway, CONTACTS cords and not ejected out Pharyngeal- Mechanical Soft -- Pharyngeal -- Pharyngeal- Regular -- Pharyngeal -- Pharyngeal- Multi-consistency -- Pharyngeal -- Pharyngeal- Pill -- Pharyngeal -- Pharyngeal Comment --  CHL IP CERVICAL ESOPHAGEAL PHASE 06/08/2021 Cervical Esophageal Phase WFL Pudding Teaspoon -- Pudding Cup -- Honey Teaspoon -- Honey Cup -- Nectar Teaspoon -- Nectar Cup -- Nectar Straw -- Thin Teaspoon -- Thin Cup -- Thin Straw -- Puree -- Mechanical Soft -- Regular -- Multi-consistency -- Pill -- Cervical Esophageal Comment -- Chelsea E Hartness MA, CCC-SLP 06/08/2021, 3:18 PM  EEG adult  Result Date: 06/06/2021 Lora Havens, MD     06/06/2021  8:24 PM Patient Name: Casey Greene MRN: [id number] Epilepsy Attending: Lora Havens Referring Physician/Provider: Dr Oren Binet Date: 06/06/2021 Duration: 23.05 mins Patient history: 70yo M with ams. EEG to evaluate for seizure. Level of alertness:  lethargic, asleep AEDs during EEG study: None Technical aspects: This EEG study was done with scalp electrodes positioned according to the 10-20 International system of electrode placement. Electrical activity was acquired at a sampling rate of 500Hz  and reviewed with a high frequency filter of 70Hz  and a low frequency filter of 1Hz . EEG data were recorded continuously and digitally stored. Description: No posterior dominant rhythm was seen. Sleep was characterized by sleep spindles (12-14hz 0, maximal frontocentral region. EEG showed continuous/ generalized 5 to 7 Hz theta as well as intermittent 2-3hz  delta  slowing. Hyperventilation and photic stimulation were not performed.   ABNORMALITY - Continuous slow, generalized IMPRESSION: This study is suggestive of moderate diffuse encephalopathy, nonspecific etiology. No seizures or epileptiform discharges were seen throughout the recording. Lora Havens   CT HEAD CODE STROKE WO CONTRAST  Result Date: 05/28/2021 CLINICAL DATA:  Code stroke.  Left-sided weakness EXAM: CT HEAD WITHOUT CONTRAST TECHNIQUE: Contiguous axial images were obtained from the base of the skull through the vertex without intravenous contrast. COMPARISON:  05/05/2021 FINDINGS: Motion artifact is present. Brain: There is hemorrhage centered within the right thalamus measuring approximately 2.2 x 2.2 x 2.1 cm. Intraventricular extension is present with hemorrhage seen within the right lateral ventricle, third ventricle, cerebral aqueduct, and fourth ventricle. Ventricles are enlarged compared to the recent prior study reflecting hydrocephalus. No definite new loss of gray-white differentiation. Patchy and confluent areas of low-attenuation in the supratentorial white matter nonspecific but probably reflects stable chronic microvascular ischemic changes. No extra-axial collection. Prominence of the ventricles and sulci reflects generalized parenchymal volume loss. Vascular: No hyperdense vessel. Skull: Unremarkable Sinuses/Orbits: No acute abnormality. Other: Mastoid air cells are clear. IMPRESSION: Acute right thalamic hemorrhage with intraventricular extension and hydrocephalus. Likely hypertensive etiology, noting findings on prior brain MRI. These results were communicated to Dr. Rory Percy at 5:49 pm on 05/28/2021 by text page via the [facility] messaging system. Electronically Signed   By: Macy Mis M.D.   On: 05/28/2021 17:52   VAS [place] LOWER EXTREMITY VENOUS (DVT)  Result Date: 06/07/2021  Lower Venous DVT Study Patient Name:  AADAM ZHEN  Date of Exam:   06/07/2021 Medical Rec #: [id number]       Accession #:    [id number] Date of Birth: [birth year]      Patient Gender: M Patient Age:   70Y Exam Location:  [facility] Procedure:      VAS [place] LOWER EXTREMITY VENOUS (DVT) Referring Phys: [id number] [facility] --------------------------------------------------------------------------------  Indications: Edema.  Comparison Study: No prior study Performing Technologist: Maudry Mayhew MHA, RDMS, RVT, RDCS  Examination Guidelines: A complete evaluation includes B-mode imaging, spectral Doppler, color Doppler, and power Doppler as needed of all accessible portions of each vessel. Bilateral testing is considered an integral part of a complete examination. Limited examinations for reoccurring indications may be performed as noted. The reflux portion of the exam is performed with the patient in reverse Trendelenburg.  +---------+---------------+---------+-----------+----------+--------------+ RIGHT    CompressibilityPhasicitySpontaneityPropertiesThrombus Aging +---------+---------------+---------+-----------+----------+--------------+ CFV      Full           Yes      Yes                                 +---------+---------------+---------+-----------+----------+--------------+  SFJ      Full                                                        +---------+---------------+---------+-----------+----------+--------------+ FV Prox  Full                                                        +---------+---------------+---------+-----------+----------+--------------+ FV Mid   Full                                                        +---------+---------------+---------+-----------+----------+--------------+ FV DistalFull                                                        +---------+---------------+---------+-----------+----------+--------------+ PFV      Full                                                         +---------+---------------+---------+-----------+----------+--------------+ POP      Full           Yes      Yes                                 +---------+---------------+---------+-----------+----------+--------------+ PTV      Full                                                        +---------+---------------+---------+-----------+----------+--------------+ PERO     Full                                                        +---------+---------------+---------+-----------+----------+--------------+   +---------+---------------+---------+-----------+----------+--------------+ LEFT     CompressibilityPhasicitySpontaneityPropertiesThrombus Aging +---------+---------------+---------+-----------+----------+--------------+ CFV      Full           Yes      Yes                                 +---------+---------------+---------+-----------+----------+--------------+ SFJ      Full                                                        +---------+---------------+---------+-----------+----------+--------------+  FV Prox  Full                                                        +---------+---------------+---------+-----------+----------+--------------+ FV Mid   Full                                                        +---------+---------------+---------+-----------+----------+--------------+ FV DistalFull                                                        +---------+---------------+---------+-----------+----------+--------------+ PFV      Full                                                        +---------+---------------+---------+-----------+----------+--------------+ POP      Full           Yes      Yes                                 +---------+---------------+---------+-----------+----------+--------------+ PTV      Full                                                         +---------+---------------+---------+-----------+----------+--------------+ PERO     Full                                                        +---------+---------------+---------+-----------+----------+--------------+     Summary: RIGHT: - There is no evidence of deep vein thrombosis in the lower extremity.  - No cystic structure found in the popliteal fossa.  LEFT: - There is no evidence of deep vein thrombosis in the lower extremity.  - No cystic structure found in the popliteal fossa.  *See table(s) above for measurements and observations. Electronically signed by Jamelle Haring on 06/07/2021 at 7:20:06 PM.    Final     Microbiology: Recent Results (from the past 240 hour(s))  SARS CORONAVIRUS 2 (TAT 6-24 HRS) Nasopharyngeal Nasopharyngeal Swab     Status: None   Collection Time: 06/15/21  9:04 AM   Specimen: Nasopharyngeal Swab  Result Value Ref Range Status   SARS Coronavirus 2 NEGATIVE NEGATIVE Final    Comment: (NOTE) SARS-CoV-2 target nucleic acids are NOT DETECTED.  The SARS-CoV-2 RNA is generally detectable in upper and lower respiratory specimens during the acute phase of infection. Negative results do not preclude SARS-CoV-2 infection, do not rule out co-infections with  other pathogens, and should not be used as the sole basis for treatment or other patient management decisions. Negative results must be combined with clinical observations, patient history, and epidemiological information. The expected result is Negative.  Fact Sheet for Patients: SugarRoll.be  Fact Sheet for Healthcare Providers: https://www.woods-mathews.com/  This test is not yet approved or cleared by the [place] FDA and  has been authorized for detection and/or diagnosis of SARS-CoV-2 by FDA under an Emergency Use Authorization (EUA). This EUA will remain  in effect (meaning this test can be used) for the duration of the COVID-19 declaration under Se  ction 564(b)(1) of the Act, 21 U.S.C. section 360bbb-3(b)(1), unless the authorization is terminated or revoked sooner.  Performed at [facility], [place] [address] [place], [place] [address]      Labs: Basic Metabolic Panel: Recent Labs  Lab 06/10/21 0313 06/11/21 0503 06/12/21 0117 06/13/21 0420 06/14/21 0333 06/15/21 0040 06/16/21 0255  NA 147* 141 140 133* 132* 135 135  K 3.7 3.6 3.6 3.9 3.9 4.3 4.7  CL 116* 112* 109 103 100 104 104  CO2 24 23 24 24 24 24 24   GLUCOSE 125* 149* 142* 118* 131* 123* 120*  BUN 87* 90* 87* 80* 77* 69* 65*  CREATININE 2.58* 2.46* 2.30* 2.14* 2.06* 1.97* 2.00*  CALCIUM 11.0* 10.7* 10.7* 10.1 10.3 10.6* 11.3*  MG 2.5* 2.6* 2.4 2.2 2.2  --   --   PHOS 4.9* 4.2 3.8 3.1 3.0  --   --    Liver Function Tests: Recent Labs  Lab 06/10/21 0313 06/11/21 0503 06/12/21 0117 06/13/21 0420 06/14/21 0333  AST 36 35 46* 38 35  ALT 14 10 13 12 13   ALKPHOS 34* 32* 34* 33* 39  BILITOT 0.6 0.8 0.4 0.4 0.7  PROT 5.8* 5.7* 5.8* 5.2* 5.6*  ALBUMIN 2.4* 2.3* 2.3* 2.2* 2.3*   CBC: Recent Labs  Lab 06/10/21 0313 06/11/21 0503 06/12/21 0117 06/13/21 0420 06/14/21 0333  WBC 9.4 7.8 7.7 6.3 6.9  NEUTROABS 8.7* 6.9 6.9 5.5 6.1  HGB 7.3* 7.3* 8.3* 7.2* 7.9*  HCT 23.1* 22.3* 26.1* 22.3* 24.3*  MCV 99.1 99.1 100.0 97.4 97.2  PLT 220 193 214 252 272    Recent Labs    05/06/21 0706  BNP 78.4    ProBNP (last 3 results) Recent Labs    02/03/21 1100  PROBNP 635*    CBG: Recent Labs  Lab 06/15/21 2014 06/15/21 2346 06/16/21 0444 06/16/21 0816 06/16/21 1200  GLUCAP 136* 139* 130* 135* 140*    Principal Problem:   ICH (intracerebral hemorrhage) (HCC) Active Problems:   Dysphagia   AKI (acute kidney injury) ([place])   Hypercalcemia   Essential hypertension   Anemia of chronic renal failure   Severe protein-calorie malnutrition ([place])   Hyponatremia   Acute hypoxemic respiratory failure (HCC)   Time coordinating discharge: 50  minutes  Signed:  Murray Hodgkins, MD  [facility]  06/16/2021, 2:10 PM

## 2021-06-16 NOTE — Progress Notes (Signed)
Daily Progress Note   Patient Name: Casey Greene       Date: 06/16/2021 DOB: Nov 25, 1951  Age: 70 y.o. MRN#: [id number] Attending Physician: Samuella Cota, MD Primary Care Physician: [name], MD Admit Date: 05/28/2021  Reason for Consultation/Follow-up:  To discuss complex medical decision making related to patient's goals of care  Subjective: Met patient at bedside.   His facial expression appears uncomfortable.  I ask if he is in pain.   His words come slowly "My book".  And he tells me he is writing a book.      Assessment: Patient appears about the same has he was when I saw him a few days ago.  PEG in place.  Patient lethargic and confused.   Patient Profile/HPI:   70 y.o. male with past medical history of multiple CVAs, DM, HFpEF (G2DD), PVD, prostate CA, CKD 3, MDD, mild neurocognitive disorder, and severe malnutrition, who was admitted on 05/28/2021 with left sided weakness.  He was found to have an intercerebral hemorrage in the right thalamic area with intraventricular extension and hydrocephalus.  Neurology recommendations have been made.  He is on aspirin and plavix and being considered for CIR.  A cor trak was placed for nutrition.  On 7/7 the patient developed a fever. Blood cultures are still pending but his very wet vocal quality and thick secretions give rise to suspicion for aspiration pneumonia.   I have spoken directly with the Neurologist who feels that he may regain a limited amount of lost functionality on the left, but it will take 2-3 months before we will really know whether or not he will regain the ability to swallow.   Of course this would be significantly hampered by recurrent aspiration pneumonia.    Length of Stay: 19   Vital Signs: BP (!) 148/87    Pulse 73   Temp 98 F (36.7 C) (Oral)   Resp 18   Ht 5' 10"  (1.778 m)   Wt 73 kg   SpO2 100%   BMI 23.09 kg/m  SpO2: SpO2: 100 % O2 Device: O2 Device: Room Air O2 Flow Rate: O2 Flow Rate (L/min): 2 L/min       Palliative Assessment/Data: 20%     Palliative Care Plan    Recommendations/Plan: Continue current course.  Patient DNR/DNI. PLease  have Palliative follow at SNF. PMT will remain available in case patient declines and re-assessment of goals is needed.  Code Status:  DNR  Prognosis:  < 6 months   Discharge Planning: [place] for rehab with Palliative care service follow-up    Thank you for allowing the Palliative Medicine Team to assist in the care of this patient.  Total time spent:  15 min.     Greater than 50%  of this time was spent counseling and coordinating care related to the above assessment and plan.  Florentina Jenny, [name] Palliative Medicine  Please contact Palliative MedicineTeam phone at [phone] for questions and concerns between 7 am - 7 pm.   Please see AMION for individual provider pager numbers.

## 2021-06-19 ENCOUNTER — Ambulatory Visit: Payer: Medicare Other | Admitting: Neurology

## 2021-06-19 DIAGNOSIS — I5031 Acute diastolic (congestive) heart failure: Secondary | ICD-10-CM | POA: Diagnosis not present

## 2021-06-19 DIAGNOSIS — I69991 Dysphagia following unspecified cerebrovascular disease: Secondary | ICD-10-CM | POA: Diagnosis not present

## 2021-06-19 DIAGNOSIS — I7389 Other specified peripheral vascular diseases: Secondary | ICD-10-CM | POA: Diagnosis not present

## 2021-06-19 DIAGNOSIS — R5381 Other malaise: Secondary | ICD-10-CM | POA: Diagnosis not present

## 2021-06-19 DIAGNOSIS — I10 Essential (primary) hypertension: Secondary | ICD-10-CM | POA: Diagnosis not present

## 2021-06-19 DIAGNOSIS — N183 Chronic kidney disease, stage 3 unspecified: Secondary | ICD-10-CM | POA: Diagnosis not present

## 2021-06-19 DIAGNOSIS — D638 Anemia in other chronic diseases classified elsewhere: Secondary | ICD-10-CM | POA: Diagnosis not present

## 2021-06-19 DIAGNOSIS — E119 Type 2 diabetes mellitus without complications: Secondary | ICD-10-CM | POA: Diagnosis not present

## 2021-06-19 DIAGNOSIS — G3184 Mild cognitive impairment, so stated: Secondary | ICD-10-CM | POA: Diagnosis not present

## 2021-06-19 DIAGNOSIS — I618 Other nontraumatic intracerebral hemorrhage: Secondary | ICD-10-CM | POA: Diagnosis not present

## 2021-06-20 DIAGNOSIS — I5032 Chronic diastolic (congestive) heart failure: Secondary | ICD-10-CM | POA: Diagnosis not present

## 2021-06-20 DIAGNOSIS — E785 Hyperlipidemia, unspecified: Secondary | ICD-10-CM | POA: Diagnosis not present

## 2021-06-20 DIAGNOSIS — E1122 Type 2 diabetes mellitus with diabetic chronic kidney disease: Secondary | ICD-10-CM | POA: Diagnosis not present

## 2021-06-21 DIAGNOSIS — I69991 Dysphagia following unspecified cerebrovascular disease: Secondary | ICD-10-CM | POA: Diagnosis not present

## 2021-06-21 DIAGNOSIS — K219 Gastro-esophageal reflux disease without esophagitis: Secondary | ICD-10-CM | POA: Diagnosis not present

## 2021-06-21 DIAGNOSIS — R1310 Dysphagia, unspecified: Secondary | ICD-10-CM | POA: Diagnosis not present

## 2021-06-21 DIAGNOSIS — N183 Chronic kidney disease, stage 3 unspecified: Secondary | ICD-10-CM | POA: Diagnosis not present

## 2021-06-23 DIAGNOSIS — E875 Hyperkalemia: Secondary | ICD-10-CM | POA: Diagnosis not present

## 2021-06-26 DIAGNOSIS — D638 Anemia in other chronic diseases classified elsewhere: Secondary | ICD-10-CM | POA: Diagnosis not present

## 2021-06-26 DIAGNOSIS — K649 Unspecified hemorrhoids: Secondary | ICD-10-CM | POA: Diagnosis not present

## 2021-06-26 DIAGNOSIS — D631 Anemia in chronic kidney disease: Secondary | ICD-10-CM | POA: Diagnosis not present

## 2021-06-26 DIAGNOSIS — I10 Essential (primary) hypertension: Secondary | ICD-10-CM | POA: Diagnosis not present

## 2021-06-26 DIAGNOSIS — Z431 Encounter for attention to gastrostomy: Secondary | ICD-10-CM | POA: Diagnosis not present

## 2021-06-26 DIAGNOSIS — I739 Peripheral vascular disease, unspecified: Secondary | ICD-10-CM | POA: Diagnosis not present

## 2021-06-26 DIAGNOSIS — E43 Unspecified severe protein-calorie malnutrition: Secondary | ICD-10-CM | POA: Diagnosis not present

## 2021-06-26 DIAGNOSIS — H01003 Unspecified blepharitis right eye, unspecified eyelid: Secondary | ICD-10-CM | POA: Diagnosis not present

## 2021-06-26 DIAGNOSIS — I69991 Dysphagia following unspecified cerebrovascular disease: Secondary | ICD-10-CM | POA: Diagnosis not present

## 2021-06-26 DIAGNOSIS — E1122 Type 2 diabetes mellitus with diabetic chronic kidney disease: Secondary | ICD-10-CM | POA: Diagnosis not present

## 2021-06-26 DIAGNOSIS — J9601 Acute respiratory failure with hypoxia: Secondary | ICD-10-CM | POA: Diagnosis not present

## 2021-06-26 DIAGNOSIS — I639 Cerebral infarction, unspecified: Secondary | ICD-10-CM | POA: Diagnosis not present

## 2021-06-26 DIAGNOSIS — N183 Chronic kidney disease, stage 3 unspecified: Secondary | ICD-10-CM | POA: Diagnosis not present

## 2021-06-26 DIAGNOSIS — D351 Benign neoplasm of parathyroid gland: Secondary | ICD-10-CM | POA: Diagnosis not present

## 2021-06-26 DIAGNOSIS — I69191 Dysphagia following nontraumatic intracerebral hemorrhage: Secondary | ICD-10-CM | POA: Diagnosis not present

## 2021-06-26 DIAGNOSIS — I619 Nontraumatic intracerebral hemorrhage, unspecified: Secondary | ICD-10-CM | POA: Diagnosis not present

## 2021-06-26 DIAGNOSIS — N189 Chronic kidney disease, unspecified: Secondary | ICD-10-CM | POA: Diagnosis not present

## 2021-06-26 DIAGNOSIS — R339 Retention of urine, unspecified: Secondary | ICD-10-CM | POA: Diagnosis not present

## 2021-06-26 DIAGNOSIS — I69154 Hemiplegia and hemiparesis following nontraumatic intracerebral hemorrhage affecting left non-dominant side: Secondary | ICD-10-CM | POA: Diagnosis not present

## 2021-06-26 DIAGNOSIS — N184 Chronic kidney disease, stage 4 (severe): Secondary | ICD-10-CM | POA: Diagnosis not present

## 2021-06-26 DIAGNOSIS — D649 Anemia, unspecified: Secondary | ICD-10-CM | POA: Diagnosis not present

## 2021-06-26 DIAGNOSIS — D509 Iron deficiency anemia, unspecified: Secondary | ICD-10-CM | POA: Diagnosis not present

## 2021-06-26 DIAGNOSIS — I129 Hypertensive chronic kidney disease with stage 1 through stage 4 chronic kidney disease, or unspecified chronic kidney disease: Secondary | ICD-10-CM | POA: Diagnosis not present

## 2021-06-26 DIAGNOSIS — H01006 Unspecified blepharitis left eye, unspecified eyelid: Secondary | ICD-10-CM | POA: Diagnosis not present

## 2021-06-29 DIAGNOSIS — H01006 Unspecified blepharitis left eye, unspecified eyelid: Secondary | ICD-10-CM | POA: Diagnosis not present

## 2021-06-29 DIAGNOSIS — H01003 Unspecified blepharitis right eye, unspecified eyelid: Secondary | ICD-10-CM | POA: Diagnosis not present

## 2021-06-30 DIAGNOSIS — D631 Anemia in chronic kidney disease: Secondary | ICD-10-CM | POA: Diagnosis not present

## 2021-06-30 DIAGNOSIS — N189 Chronic kidney disease, unspecified: Secondary | ICD-10-CM | POA: Diagnosis not present

## 2021-06-30 DIAGNOSIS — D351 Benign neoplasm of parathyroid gland: Secondary | ICD-10-CM | POA: Diagnosis not present

## 2021-06-30 DIAGNOSIS — N184 Chronic kidney disease, stage 4 (severe): Secondary | ICD-10-CM | POA: Diagnosis not present

## 2021-06-30 DIAGNOSIS — I10 Essential (primary) hypertension: Secondary | ICD-10-CM | POA: Diagnosis not present

## 2021-06-30 DIAGNOSIS — I129 Hypertensive chronic kidney disease with stage 1 through stage 4 chronic kidney disease, or unspecified chronic kidney disease: Secondary | ICD-10-CM | POA: Diagnosis not present

## 2021-06-30 DIAGNOSIS — I639 Cerebral infarction, unspecified: Secondary | ICD-10-CM | POA: Diagnosis not present

## 2021-07-03 ENCOUNTER — Telehealth: Payer: Self-pay

## 2021-07-03 NOTE — Progress Notes (Signed)
Chronic Care Management Pharmacy Assistant   Name: Casey Greene  MRN: [id number] DOB: 1950-12-30  Reason for Encounter:Hypertension Disease State Call.   Recent office visits:  05/22/2021 Dr.Rudd MD referral place for Gastroenterologist 05/19/2021 Dr. Gena Fray MD sucralfate (CARAFATE) 1 GM/10ML suspension; Place 20 mLs (2 g total) rectally 2 (two) times daily 05/12/2021 Dr.Rudd MD No Medication Change Noted  Recent consult visits:  No recent [facility] visits:  Medication Reconciliation was completed by comparing discharge summary, patient's EMR and Pharmacy list, and upon discussion with patient.  Admitted to the hospital on 05/28/2021 due to Thalamic hemorrhage. Discharge date was 06/16/2021. Discharged from [place]?Medications Started at [facility] Discharge:?? - None Medication Changes at Hospital Discharge: -Changed None  Medications Discontinued at Hospital Discharge: -Stopped None  Medications that remain the same after Hospital Discharge:??  -All other medications will remain the same.    Admitted to the hospital on 05/05/2021 due to Altered Mental Status. Discharge date was 05/09/2021. Discharged from [place]?Medications [facility] at [facility] Discharge:?? started donepezil 10 MG tablet Take 1/2 tablet daily for 2 weeks, then increase to 1 tablet daily started atorvastatin 40 MG tablet daily at bedtime cephALEXin 500 MG capsule every 12 (twelve) hours for 5 days.  Medication Changes at Hospital Discharge: -Changed None  Medications Discontinued at Hospital Discharge: None  Medications that remain the same after Hospital Discharge:??  -All other medications will remain the same.    Medications: Outpatient Encounter Medications as of 07/03/2021  Medication Sig   atorvastatin (LIPITOR) 40 MG tablet Place 1 tablet (40 mg total) into feeding tube daily.   carvedilol (COREG) 25 MG tablet 1 tablet  (25 mg total) by Per NG tube route 2 (two) times daily with a meal.   donepezil (ARICEPT) 10 MG tablet Place 1 tablet (10 mg total) into feeding tube in the morning.   hydrALAZINE (APRESOLINE) 100 MG tablet Place 1 tablet (100 mg total) into feeding tube 3 (three) times daily.   iron polysaccharides (NIFEREX) 150 MG capsule Place 1 capsule (150 mg total) into feeding tube daily.   Multiple Vitamin (MULTIVITAMIN WITH MINERALS) TABS tablet Place 1 tablet into feeding tube daily.   Nutritional Supplements (FEEDING SUPPLEMENT, OSMOLITE 1.5 CAL,) LIQD Place 1,000 mLs into feeding tube continuous.   Nutritional Supplements (FEEDING SUPPLEMENT, PROSOURCE TF,) liquid Place 45 mLs into feeding tube 2 (two) times daily.   pantoprazole sodium (PROTONIX) 40 mg/20 mL PACK Place 20 mLs (40 mg total) into feeding tube at bedtime.   senna-docusate (SENOKOT-S) 8.6-50 MG tablet Place 1 tablet into feeding tube 2 (two) times daily.   Water For Irrigation, Sterile (FREE WATER) SOLN Place 400 mLs into feeding tube every 8 (eight) hours.   No facility-administered encounter medications on file as of 07/03/2021.    Care Gaps: Tetanus Vaccine Shingrix Vaccine COVID-19 Vaccine Influenza Vaccine Star Rating Drugs: Atorvastatin 40 mg 90 day supply last filled 05/09/2021 at [facility]. Medication Fill History: Carvedilol  25 MG 90 day supply last filled 03/06/2021 at [facility]. HydrALAZINE 100 MG 90 day supply last filled 01/24/2021 at [facility].  Reviewed chart prior to disease state call. Spoke with patient regarding BP  Recent Office Vitals: BP Readings from Last 3 Encounters:  06/16/21 (!) 151/80  05/19/21 116/68  05/12/21 118/66   Pulse Readings from Last 3 Encounters:  06/16/21 77  05/19/21 77  05/12/21 76    Wt  Readings from Last 3 Encounters:  06/16/21 160 lb 15 oz (73 kg)  05/19/21 163 lb (73.9 kg)  05/12/21 165 lb 3.2 oz (74.9 kg)     Kidney Function Lab Results   Component Value Date/Time   CREATININE 2.00 (H) 06/16/2021 02:55 AM   CREATININE 1.97 (H) 06/15/2021 12:40 AM   CREATININE 2.66 (H) 06/17/2020 03:35 PM   CREATININE 2.29 (H) 06/10/2018 03:47 PM   GFR 20.22 (L) 05/17/2021 10:50 AM   GFRNONAA 35 (L) 06/16/2021 02:55 AM   GFRNONAA 24 (L) 05/01/2018 02:54 PM   GFRAA 31 04/13/2020 12:00 AM   GFRAA 28 (L) 05/01/2018 02:54 PM    BMP Latest Ref Rng & Units 06/16/2021 06/15/2021 06/14/2021  Glucose 70 - 99 mg/dL 120(H) 123(H) 131(H)  BUN 8 - 23 mg/dL 65(H) 69(H) 77(H)  Creatinine 0.61 - 1.24 mg/dL 2.00(H) 1.97(H) 2.06(H)  BUN/Creat Ratio 10 - 24 - - -  Sodium 135 - 145 mmol/L 135 135 132(L)  Potassium 3.5 - 5.1 mmol/L 4.7 4.3 3.9  Chloride 98 - 111 mmol/L 104 104 100  CO2 22 - 32 mmol/L 24 24 24   Calcium 8.9 - 10.3 mg/dL 11.3(H) 10.6(H) 10.3    Current antihypertensive regimen:  Hydralazine 100 mg 1 tablet 3 times daily Carvedilol 25 mg 1 tablet 2 times daily How often are you checking your Blood Pressure?  Patient wife states patient is in a nursing home recovering from his stroke. Current home BP readings:  Patient wife states his blood pressure was 110/80 on 06/30/2021.Patient wife states his nephrologist does not was his blood pressure that low, he prefers the patient blood pressure to range around 130-140/80's. On 07/01/2021 patient blood pressure was 130/82. What recent interventions/DTPs have been made by any provider to improve Blood Pressure control since last CPP Visit: None Any recent hospitalizations or ED visits since last visit with CPP? Yes 05/28/2021 Hospitalizations for Thalamic hemorrhage 05/05/2021 Hospitalizations for Altered mental status  What diet changes have been made to improve Blood Pressure Control?  Patient wife states patient was place on a feeding tube because he is unable to swallow due to the stroke he had. What exercise is being done to improve your Blood Pressure Control?  Patient wife reports patient  can not move his left side, and he sleeps most of the day because of the stroke he had.  Adherence Review: Is the patient currently on ACE/ARB medication? No Does the patient have >5 day gap between last estimated fill dates? No  Anderson Malta Clinical Production designer, theatre/television/film [phone]

## 2021-07-07 ENCOUNTER — Ambulatory Visit: Payer: Medicare Other | Admitting: Neurology

## 2021-07-10 DIAGNOSIS — D509 Iron deficiency anemia, unspecified: Secondary | ICD-10-CM | POA: Diagnosis not present

## 2021-07-10 DIAGNOSIS — K649 Unspecified hemorrhoids: Secondary | ICD-10-CM | POA: Diagnosis not present

## 2021-07-10 DIAGNOSIS — N183 Chronic kidney disease, stage 3 unspecified: Secondary | ICD-10-CM | POA: Diagnosis not present

## 2021-07-10 DIAGNOSIS — D638 Anemia in other chronic diseases classified elsewhere: Secondary | ICD-10-CM | POA: Diagnosis not present

## 2021-07-10 DIAGNOSIS — D351 Benign neoplasm of parathyroid gland: Secondary | ICD-10-CM | POA: Diagnosis not present

## 2021-07-11 DIAGNOSIS — D649 Anemia, unspecified: Secondary | ICD-10-CM | POA: Diagnosis not present

## 2021-07-15 DIAGNOSIS — I129 Hypertensive chronic kidney disease with stage 1 through stage 4 chronic kidney disease, or unspecified chronic kidney disease: Secondary | ICD-10-CM | POA: Diagnosis not present

## 2021-07-15 DIAGNOSIS — M1990 Unspecified osteoarthritis, unspecified site: Secondary | ICD-10-CM | POA: Diagnosis not present

## 2021-07-15 DIAGNOSIS — G309 Alzheimer's disease, unspecified: Secondary | ICD-10-CM | POA: Diagnosis not present

## 2021-07-15 DIAGNOSIS — I69398 Other sequelae of cerebral infarction: Secondary | ICD-10-CM | POA: Diagnosis not present

## 2021-07-15 DIAGNOSIS — D351 Benign neoplasm of parathyroid gland: Secondary | ICD-10-CM | POA: Diagnosis not present

## 2021-07-15 DIAGNOSIS — I10 Essential (primary) hypertension: Secondary | ICD-10-CM | POA: Diagnosis not present

## 2021-07-15 DIAGNOSIS — D509 Iron deficiency anemia, unspecified: Secondary | ICD-10-CM | POA: Diagnosis not present

## 2021-07-15 DIAGNOSIS — I619 Nontraumatic intracerebral hemorrhage, unspecified: Secondary | ICD-10-CM | POA: Diagnosis not present

## 2021-07-15 DIAGNOSIS — G3184 Mild cognitive impairment, so stated: Secondary | ICD-10-CM | POA: Diagnosis not present

## 2021-07-15 DIAGNOSIS — I639 Cerebral infarction, unspecified: Secondary | ICD-10-CM | POA: Diagnosis not present

## 2021-07-15 DIAGNOSIS — I69354 Hemiplegia and hemiparesis following cerebral infarction affecting left non-dominant side: Secondary | ICD-10-CM | POA: Diagnosis not present

## 2021-07-15 DIAGNOSIS — R252 Cramp and spasm: Secondary | ICD-10-CM | POA: Diagnosis not present

## 2021-07-15 DIAGNOSIS — I69991 Dysphagia following unspecified cerebrovascular disease: Secondary | ICD-10-CM | POA: Diagnosis not present

## 2021-07-17 DIAGNOSIS — D351 Benign neoplasm of parathyroid gland: Secondary | ICD-10-CM | POA: Diagnosis not present

## 2021-07-17 DIAGNOSIS — D509 Iron deficiency anemia, unspecified: Secondary | ICD-10-CM | POA: Diagnosis not present

## 2021-07-17 DIAGNOSIS — I129 Hypertensive chronic kidney disease with stage 1 through stage 4 chronic kidney disease, or unspecified chronic kidney disease: Secondary | ICD-10-CM | POA: Diagnosis not present

## 2021-07-17 DIAGNOSIS — I69991 Dysphagia following unspecified cerebrovascular disease: Secondary | ICD-10-CM | POA: Diagnosis not present

## 2021-07-18 ENCOUNTER — Other Ambulatory Visit (HOSPITAL_COMMUNITY): Payer: Self-pay | Admitting: *Deleted

## 2021-07-19 ENCOUNTER — Inpatient Hospital Stay (HOSPITAL_COMMUNITY)
Admission: RE | Admit: 2021-07-19 | Discharge: 2021-07-19 | Disposition: A | Discharge status: Home / Self Care | Payer: Medicare Other | Source: Ambulatory Visit | Attending: Nephrology | Admitting: Nephrology

## 2021-07-19 ENCOUNTER — Encounter (HOSPITAL_COMMUNITY): Payer: Self-pay

## 2021-07-19 DIAGNOSIS — I10 Essential (primary) hypertension: Secondary | ICD-10-CM | POA: Diagnosis not present

## 2021-07-19 DIAGNOSIS — I69354 Hemiplegia and hemiparesis following cerebral infarction affecting left non-dominant side: Secondary | ICD-10-CM | POA: Diagnosis not present

## 2021-07-19 DIAGNOSIS — M1990 Unspecified osteoarthritis, unspecified site: Secondary | ICD-10-CM | POA: Diagnosis not present

## 2021-07-19 NOTE — Progress Notes (Signed)
Pt was scheduled for a blood transfusion with retacrit injection today and was a no show.

## 2021-07-24 ENCOUNTER — Encounter: Payer: Self-pay | Admitting: Physician Assistant

## 2021-07-24 ENCOUNTER — Other Ambulatory Visit: Payer: Self-pay

## 2021-07-24 ENCOUNTER — Ambulatory Visit: Payer: Medicare Other | Admitting: Physician Assistant

## 2021-07-24 VITALS — BP 119/71 | HR 68 | Resp 20 | Ht 70.0 in

## 2021-07-24 DIAGNOSIS — I69398 Other sequelae of cerebral infarction: Secondary | ICD-10-CM | POA: Diagnosis not present

## 2021-07-24 DIAGNOSIS — G3184 Mild cognitive impairment, so stated: Secondary | ICD-10-CM | POA: Diagnosis not present

## 2021-07-24 DIAGNOSIS — F0280 Dementia in other diseases classified elsewhere without behavioral disturbance: Secondary | ICD-10-CM

## 2021-07-24 DIAGNOSIS — G309 Alzheimer's disease, unspecified: Secondary | ICD-10-CM

## 2021-07-24 DIAGNOSIS — I639 Cerebral infarction, unspecified: Secondary | ICD-10-CM

## 2021-07-24 DIAGNOSIS — D509 Iron deficiency anemia, unspecified: Secondary | ICD-10-CM | POA: Diagnosis not present

## 2021-07-24 DIAGNOSIS — F0670 Mild neurocognitive disorder due to known physiological condition without behavioral disturbance: Secondary | ICD-10-CM

## 2021-07-24 DIAGNOSIS — I129 Hypertensive chronic kidney disease with stage 1 through stage 4 chronic kidney disease, or unspecified chronic kidney disease: Secondary | ICD-10-CM | POA: Diagnosis not present

## 2021-07-24 DIAGNOSIS — I69354 Hemiplegia and hemiparesis following cerebral infarction affecting left non-dominant side: Secondary | ICD-10-CM | POA: Diagnosis not present

## 2021-07-24 DIAGNOSIS — R252 Cramp and spasm: Secondary | ICD-10-CM

## 2021-07-24 DIAGNOSIS — F0150 Vascular dementia without behavioral disturbance: Secondary | ICD-10-CM

## 2021-07-24 NOTE — Patient Instructions (Addendum)
1. Schedule CT  brain without contrast  2. Stop Donepezil  3. Referral will be sent to Physical Medicine and Rehab to help with post-stroke care  4. Recommend PT and SP to improve overall status   5. Follow-up in 6 months, call for any changes   FALL PRECAUTIONS: Be cautious when walking. Scan the area for obstacles that may increase the risk of trips and falls. When getting up in the mornings, sit up at the edge of the bed for a few minutes before getting out of bed. Consider elevating the bed at the head end to avoid drop of blood pressure when getting up. Walk always in a well-lit room (use night lights in the walls). Avoid area rugs or power cords from appliances in the middle of the walkways. Use a walker or a cane if necessary and consider physical therapy for balance exercise. Get your eyesight checked regularly.  HOME SAFETY: Consider the safety of the kitchen when operating appliances like stoves, microwave oven, and blender. Consider having supervision and share cooking responsibilities until no longer able to participate in those. Accidents with firearms and other hazards in the house should be identified and addressed as well.  DRIVING: Regarding driving, in patients with progressive memory problems, driving will be impaired. We advise to have someone else do the driving if trouble finding directions or if minor accidents are reported. Independent driving assessment is available to determine safety of driving.  ABILITY TO BE LEFT ALONE: If patient is unable to contact 911 operator, consider using LifeLine, or when the need is there, arrange for someone to stay with patients. Smoking is a fire hazard, consider supervision or cessation. Risk of wandering should be assessed by caregiver and if detected at any point, supervision and safe proof recommendations should be instituted.  RECOMMENDATIONS FOR ALL PATIENTS WITH MEMORY PROBLEMS: 1. Continue to exercise (Recommend 30 minutes of  walking everyday, or 3 hours every week) 2. Increase social interactions - continue going to [place] and enjoy social gatherings with friends and family 3. Eat healthy, avoid fried foods and eat more fruits and vegetables 4. Maintain adequate blood pressure, blood sugar, and blood cholesterol level. Reducing the risk of stroke and cardiovascular disease also helps promoting better memory. 5. Avoid stressful situations. Live a simple life and avoid aggravations. Organize your time and prepare for the next day in anticipation. 6. Sleep well, avoid any interruptions of sleep and avoid any distractions in the bedroom that may interfere with adequate sleep quality 7. Avoid sugar, avoid sweets as there is a strong link between excessive sugar intake, diabetes, and cognitive impairment We discussed the Mediterranean diet, which has been shown to help patients reduce the risk of progressive memory disorders and reduces cardiovascular risk. This includes eating fish, eat fruits and green leafy vegetables, nuts like almonds and hazelnuts, walnuts, and also use olive oil. Avoid fast foods and fried foods as much as possible. Avoid sweets and sugar as sugar use has been linked to worsening of memory function.  There is always a concern of gradual progression of memory problems. If this is the case, then we may need to adjust level of care according to patient needs. Support, both to the patient and caregiver, should then be put into place.  We have sent a referral to [facility] for your CT and they will call you directly to schedule your appointment. They are located at [place]. If you need to contact them directly please call  [phone].  

## 2021-07-24 NOTE — Progress Notes (Signed)
Patient was seen, evaluated, and treatment plan was discussed with the Advanced Practice Provider. Records were reviewed. Unfortunately since his last visit, he was admitted 05/28/21 for right thalamic stroke with IVH. He was lethargic and had PEG placement for nutrition and medication needs. MRI brain done 7/4 showed 2 small acute infarcts in the right parietal lobe, right thalamic hemorrhage with intraventricular extension, MRA no significant stenosis. LDL 50. Last head CT done 06/06/21 showed slightly decreased size in right thalamic bleed, decreased intraventricular hemorrhage, stable regional mass effect. He continues to be drowsy since hospital discharge a month ago, unable to participate with physical therapy. He is minimally responsive today sitting on a wheelchair, answering 1-word answers, denying any headache but holding his hand to his forehead. His wife notes he complains of pains in his knees, left arm. There is left hand swelling. During the visit, he was noted earlier by Sharene Butters, PA to have brief right hand jerking. This had resolved on my evaluation, his wife notes this is the first time she has seen this. He had an EEG on 06/06/21 which showed diffuse theta and delta slowing, no epileptiform discharges. Continue to monitor, may repeat EEG if symptoms recur. Discussed repeating head CT without contrast to assess for hydrocephalus. Stop Donepezil. He will be referred to Physical Medicine and Rehab for post-stroke care, he has spasticity on the left arm. If no change in head CT, may consider starting an antidepressant.    I have also reviewed the orders written for this patient which were under my direction. I agree with the findings and the plan of care as documented by the Advanced Practice Provider.

## 2021-07-24 NOTE — Progress Notes (Signed)
Assessment/Plan:    Mild Neurocognitive disorder due to multiple etiologies   70 year old right-handed man, with a history of mild neurocognitive disorder, complicated since his last visit with admission to the hospital on 05/28/2021 for right thalamic stroke with IVH.  Due to lethargy, PEG placement was performed for nutrition and medication needs.  MRI on 05/29/2021 showed 2 small acute infarcts in the right parietal lobe, right thalamic hemorrhage with intraventricular extension, MRA without significant stenosis.  Follow-up CT scan on 06/06/2021 showed slightly decreased size in the right thalamic bleed, decreased IVH, stable regional mass-effect.  He continues to be drowsy since hospital discharge, and unable to participate with physical therapy and speech therapy.He had a brief right hand jerking, with quick resolution.  Of note, EEG 06/06/2021 was negative for epileptiform discharges.   Recommendations:   1.discontinue Aricept for now 2. repeat CT without contrast to assess for hydrocephalus; if no changes, may consider starting an antidepressant 3.referral to physical medicine and rehab for post stroke care 4. Follow up in 6 months       Subjective:   ED visits since last seen: none  Hospital admissions: none  Konor Noren is a 70 y.o. male with a history of hypertension, hyperlipidemia, prostate cancer, seen today in follow up for mild neurocognitive disorder, possibly mixed etiology (vascular and Alzheimer's disease). This patient is accompanied in the office by his wife who supplements the history.  Previous records as well as any outside records available were reviewed prior to todays visit.  he  is currently on donepezil 10 mg daily via feeding tube. Since his last visit on 04/03/2021, the patient has significant health issues, including admission to [facility] on 05/28/2021 due to right thalamic hemorrhage with intraventricular extension and hydrocephalus, without plans for  neurosurgical intervention.  He was then transferred to a SNF indefinitely.  Due to significant swallowing difficulties, the patient has a feeding tube now for nutrition and medications.  His wife reports that "he sleeps too much ".  Apparently, no speech therapy was received, until they hear from [facility], and he also has not been having physical therapy due to increased somnolence.  His wife says that there are no other new symptoms, but he appears to be very deconditioned, and he needed transfusion of blood, but the family decided against it for now, and instead to try "blood builder's instead ".  She reports that "the only good thing is that he does not drool as much as before ".  When he sleeps, he has vivid dreams, talking about "playing in the sand, tearing up at this stage, or wanting ice cream ".  He does complain of pain, but the wife cannot point out where this pain is located, and "not even sure that he has pain ".  He is wheelchair-bound at this time, and minimally responsive, answering one-word answers, holding his hand to his forehead.  For a brief time during the visit, he was noted to have right hand jerking, lasting only a few minutes.  Of note, EEG on 06/06/2021 showed diffuse theta and delta slowing, without epileptiform discharges.     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 He underwent Neuropsychological testing in 12/2020, results reviewed, there was note of "fairly diffuse cognitive dysfunction with primary impairments surrounding executive functioning, receptive language, semantic fluency, confrontation naming, and  visuospatial abilities. Verbal memory also represented a notable weakness, while variability was exhibited across processing speed and attention/concentration. Overall,  he is likely on the cusp between mild and major neurocognitive disorder distinctions. However, at the present time, he is perhaps best characterized as being towards the severe end of the mild neurocognitive disorder ("mild cognitive impairment") spectrum. I believe it very likely that there is a significant vascular etiology given his numerous cardiovascular ailments and neuroimaging suggesting remote pontine and bilateral thalamic infarcts. However, while a vascular cause would certainly explain deficits in processing speed, attention/concentration, executive functioning, verbal fluency, and encoding/retrieval aspects of memory, the extent of dysfunction in receptive language, confrontation naming, and visuospatial abilities would be surprising. As such, there remains the possibility for an underlying neurodegenerative condition exacerbated by cerebrovascular changes (i.e., a "mixed dementia" presentation). Alzheimer's disease represents the most common neurodegenerative illness and could explain deficits in confrontation naming and visuospatial abilities."   He thinks his memory is good. He lives with his wife and son. His wife started noticing changes around November 2021. His blood pressure kept going up, he said he was taking his medications but they found out he had not been taking them. His wife has started managing medications since then. He stopped driving a couple of months ago due to left knee issues, he got lost driving in December but otherwise his wife denies any driving concerns. His wife manages finances. He is independent with dressing and bathing. His wife has noticed that he is "worried about getting things exact," which is new. He says this was a one time incident, but she reminds him this is not, when he is talking about something, he wants to make sure everything is exact and wants to keep clarifying things. His wife denies any personality changes, paranoia, hallucinations. He feels his mood  is good. Sleep is fair, no REM behaviors. No daytime drowsiness. He denies any headaches, dizziness, diplopia, dysarthria/dysphagia, neck/back pain, focal numbness/tingling/weakness, bowel incontinence, anosmia, or tremors. His tongue stays dry despite keeping hydrated. He has urinary incontinence and wears Depends. Last fall was 9 months ago, he has been using a cane since then due to left knee problems.    I personally reviewed head CT without contrast done 11/2020 which did not show any acute changes. There was mild diffuse atrophy and chronic microvascular disease, remote lacunar infarcts in the bilateral thalami and central pons They deny any prior history of stroke symptoms.   Laboratory Data: Recent Labs[] Expand by Default       Lab Results  Component Value Date    TSH 2.72 03/28/2018      VITAMINB12 628 06/17/2020   PREVIOUS MEDICATIONS:   CURRENT MEDICATIONS:  Outpatient Encounter Medications as of 07/24/2021  Medication Sig   atorvastatin (LIPITOR) 40 MG tablet Place 1 tablet (40 mg total) into feeding tube daily.   carvedilol (COREG) 25 MG tablet 1 tablet (25 mg total) by Per NG tube route 2 (two) times daily with a meal.   hydrALAZINE (APRESOLINE) 100 MG tablet Place 1 tablet (100 mg total) into feeding tube 3 (three) times daily.   iron polysaccharides (NIFEREX) 150 MG capsule Place 1 capsule (150 mg total) into feeding tube daily.   Multiple Vitamin (MULTIVITAMIN WITH MINERALS) TABS tablet Place 1 tablet into feeding tube daily.   Nutritional Supplements (FEEDING SUPPLEMENT, OSMOLITE 1.5 CAL,) LIQD Place 1,000 mLs into feeding tube continuous.   Nutritional Supplements (FEEDING SUPPLEMENT, PROSOURCE TF,) liquid Place 45 mLs into feeding tube 2 (  two) times daily.   pantoprazole sodium (PROTONIX) 40 mg/20 mL PACK Place 20 mLs (40 mg total) into feeding tube at bedtime.   senna-docusate (SENOKOT-S) 8.6-50 MG tablet Place 1 tablet into feeding tube 2 (two) times daily.   Water  For Irrigation, Sterile (FREE WATER) SOLN Place 400 mLs into feeding tube every 8 (eight) hours.   [DISCONTINUED] donepezil (ARICEPT) 10 MG tablet Place 1 tablet (10 mg total) into feeding tube in the morning.   No facility-administered encounter medications on file as of 07/24/2021.     Objective:     PHYSICAL EXAMINATION:    VITALS:   Vitals:   07/24/21 0931  BP: 119/71  Pulse: 68  Resp: 20  SpO2: 98%  Height: 5\' 10"  (1.778 m)    GEN:  The patient appears stated age and is in NAD. HEENT:  Normocephalic, atraumatic.   Neurological examination:  General: NAD,, ill appearing Orientation: The patient is alert. Not oriented to person, place or time during this visit  Cranial nerves: There is good facial symmetry.The speech is clear but patient is somnolent, therefore speech not fluent. No aphasia or dysarthria. Fund of knowledge is reduced. Recent and remote memory are impaired. Attention and concentration are reduced. Unable to assess object naming or phrase repeating due to somnolence.  Hearing is intact to conversational tone.    Sensation: Sensation is intact to light touch throughout Motor: Strength is at least antigravity x4. Tremors: none  DTR's 2/4 in UE/LE        Movement examination: Tone: Patient unable to participate in the exam, but tone appears reduced in all extremities  Abnormal movements:  no tremor.  As mentioned in HPI, the patient had a brief moment of right hand jerking, quickly resolving without recurrence.  No asterixis.  Left hand swelling Coordination: Unable to assess, the patient is not following commands at this time.  Gait and Station: The patient is in a wheelchair at this time    Total time spent on today's visit was 30 minutes, including both face-to-face time and nonface-to-face time. Time included that spent on review of records (prior notes available to me/labs/imaging if pertinent), discussing treatment and goals, answering patient's  questions and coordinating care.  Cc:  Alvester Morin, MD Sharene Butters, [name]

## 2021-07-25 ENCOUNTER — Other Ambulatory Visit: Payer: Self-pay

## 2021-07-25 ENCOUNTER — Telehealth: Payer: Self-pay

## 2021-07-25 ENCOUNTER — Telehealth: Payer: Self-pay | Admitting: Physician Assistant

## 2021-07-25 DIAGNOSIS — E785 Hyperlipidemia, unspecified: Secondary | ICD-10-CM | POA: Diagnosis not present

## 2021-07-25 DIAGNOSIS — I440 Atrioventricular block, first degree: Secondary | ICD-10-CM | POA: Diagnosis not present

## 2021-07-25 DIAGNOSIS — K921 Melena: Secondary | ICD-10-CM | POA: Diagnosis not present

## 2021-07-25 DIAGNOSIS — N281 Cyst of kidney, acquired: Secondary | ICD-10-CM | POA: Diagnosis not present

## 2021-07-25 DIAGNOSIS — R339 Retention of urine, unspecified: Secondary | ICD-10-CM | POA: Diagnosis not present

## 2021-07-25 DIAGNOSIS — D62 Acute posthemorrhagic anemia: Secondary | ICD-10-CM | POA: Diagnosis not present

## 2021-07-25 DIAGNOSIS — G8194 Hemiplegia, unspecified affecting left nondominant side: Secondary | ICD-10-CM | POA: Diagnosis not present

## 2021-07-25 DIAGNOSIS — R404 Transient alteration of awareness: Secondary | ICD-10-CM | POA: Diagnosis not present

## 2021-07-25 DIAGNOSIS — L89610 Pressure ulcer of right heel, unstageable: Secondary | ICD-10-CM | POA: Diagnosis not present

## 2021-07-25 DIAGNOSIS — I69391 Dysphagia following cerebral infarction: Secondary | ICD-10-CM | POA: Diagnosis not present

## 2021-07-25 DIAGNOSIS — K5730 Diverticulosis of large intestine without perforation or abscess without bleeding: Secondary | ICD-10-CM | POA: Diagnosis not present

## 2021-07-25 DIAGNOSIS — R1312 Dysphagia, oropharyngeal phase: Secondary | ICD-10-CM | POA: Diagnosis not present

## 2021-07-25 DIAGNOSIS — Z8679 Personal history of other diseases of the circulatory system: Secondary | ICD-10-CM | POA: Diagnosis not present

## 2021-07-25 DIAGNOSIS — I130 Hypertensive heart and chronic kidney disease with heart failure and stage 1 through stage 4 chronic kidney disease, or unspecified chronic kidney disease: Secondary | ICD-10-CM | POA: Diagnosis not present

## 2021-07-25 DIAGNOSIS — D638 Anemia in other chronic diseases classified elsewhere: Secondary | ICD-10-CM | POA: Diagnosis not present

## 2021-07-25 DIAGNOSIS — I639 Cerebral infarction, unspecified: Secondary | ICD-10-CM

## 2021-07-25 DIAGNOSIS — L89159 Pressure ulcer of sacral region, unspecified stage: Secondary | ICD-10-CM | POA: Diagnosis not present

## 2021-07-25 DIAGNOSIS — R058 Other specified cough: Secondary | ICD-10-CM | POA: Diagnosis not present

## 2021-07-25 DIAGNOSIS — R310 Gross hematuria: Secondary | ICD-10-CM | POA: Diagnosis not present

## 2021-07-25 DIAGNOSIS — R58 Hemorrhage, not elsewhere classified: Secondary | ICD-10-CM | POA: Diagnosis not present

## 2021-07-25 DIAGNOSIS — L89620 Pressure ulcer of left heel, unstageable: Secondary | ICD-10-CM | POA: Diagnosis not present

## 2021-07-25 DIAGNOSIS — Z66 Do not resuscitate: Secondary | ICD-10-CM | POA: Diagnosis not present

## 2021-07-25 DIAGNOSIS — R4189 Other symptoms and signs involving cognitive functions and awareness: Secondary | ICD-10-CM

## 2021-07-25 DIAGNOSIS — K219 Gastro-esophageal reflux disease without esophagitis: Secondary | ICD-10-CM | POA: Diagnosis not present

## 2021-07-25 DIAGNOSIS — Z993 Dependence on wheelchair: Secondary | ICD-10-CM | POA: Diagnosis not present

## 2021-07-25 DIAGNOSIS — K649 Unspecified hemorrhoids: Secondary | ICD-10-CM | POA: Diagnosis not present

## 2021-07-25 DIAGNOSIS — R1111 Vomiting without nausea: Secondary | ICD-10-CM | POA: Diagnosis not present

## 2021-07-25 DIAGNOSIS — Z20822 Contact with and (suspected) exposure to covid-19: Secondary | ICD-10-CM | POA: Diagnosis not present

## 2021-07-25 DIAGNOSIS — F0150 Vascular dementia without behavioral disturbance: Secondary | ICD-10-CM

## 2021-07-25 DIAGNOSIS — R001 Bradycardia, unspecified: Secondary | ICD-10-CM | POA: Diagnosis not present

## 2021-07-25 DIAGNOSIS — I959 Hypotension, unspecified: Secondary | ICD-10-CM | POA: Diagnosis not present

## 2021-07-25 DIAGNOSIS — I700 Atherosclerosis of aorta: Secondary | ICD-10-CM | POA: Diagnosis not present

## 2021-07-25 NOTE — Telephone Encounter (Signed)
Order placed in epic for ct at [facility]

## 2021-07-25 NOTE — Telephone Encounter (Signed)
Spoke with Casey Greene, Clinical Coordinator at [phone] she stated that Casey Greene has not been doing well being there. He is not tolerated therapy and it had to be DC because he made no progress they did start him in restorative for passive range of motion to see if they can help with swelling in left hand, pt has been aspirating his tube feeding He is a DNR on comfort care with no hospital transport at this time. They would like for his CT to be done at [facility] because he can not tolerate being up in his chair but for so long,

## 2021-07-25 NOTE — Telephone Encounter (Signed)
Order changed to [facility]

## 2021-07-25 NOTE — Telephone Encounter (Signed)
Ok to send order for head CT at location of her request. Pls call the number she gave on MyChart for information on his status. Thanks

## 2021-07-25 NOTE — Telephone Encounter (Signed)
Pt wife would like to know if the CT scan that has to be done can be done at [facility] in [place]. She would like a call back to discuss things

## 2021-07-25 NOTE — Telephone Encounter (Signed)
Pls send order for head CT to [facility], thanks

## 2021-07-26 ENCOUNTER — Encounter (HOSPITAL_COMMUNITY): Payer: Medicare Other

## 2021-07-26 DIAGNOSIS — R339 Retention of urine, unspecified: Secondary | ICD-10-CM | POA: Diagnosis not present

## 2021-07-26 DIAGNOSIS — I639 Cerebral infarction, unspecified: Secondary | ICD-10-CM | POA: Diagnosis not present

## 2021-07-26 DIAGNOSIS — D62 Acute posthemorrhagic anemia: Secondary | ICD-10-CM | POA: Diagnosis not present

## 2021-07-26 DIAGNOSIS — I440 Atrioventricular block, first degree: Secondary | ICD-10-CM | POA: Diagnosis not present

## 2021-07-26 DIAGNOSIS — I69252 Hemiplegia and hemiparesis following other nontraumatic intracranial hemorrhage affecting left dominant side: Secondary | ICD-10-CM | POA: Diagnosis not present

## 2021-07-26 DIAGNOSIS — L89619 Pressure ulcer of right heel, unspecified stage: Secondary | ICD-10-CM | POA: Diagnosis not present

## 2021-07-26 DIAGNOSIS — R1312 Dysphagia, oropharyngeal phase: Secondary | ICD-10-CM | POA: Diagnosis not present

## 2021-07-26 DIAGNOSIS — R310 Gross hematuria: Secondary | ICD-10-CM | POA: Diagnosis not present

## 2021-07-26 DIAGNOSIS — Z8673 Personal history of transient ischemic attack (TIA), and cerebral infarction without residual deficits: Secondary | ICD-10-CM | POA: Diagnosis not present

## 2021-07-26 DIAGNOSIS — I10 Essential (primary) hypertension: Secondary | ICD-10-CM | POA: Diagnosis not present

## 2021-07-26 DIAGNOSIS — L89629 Pressure ulcer of left heel, unspecified stage: Secondary | ICD-10-CM | POA: Diagnosis not present

## 2021-07-26 DIAGNOSIS — E785 Hyperlipidemia, unspecified: Secondary | ICD-10-CM | POA: Diagnosis not present

## 2021-07-26 DIAGNOSIS — Z993 Dependence on wheelchair: Secondary | ICD-10-CM | POA: Diagnosis not present

## 2021-07-26 DIAGNOSIS — K219 Gastro-esophageal reflux disease without esophagitis: Secondary | ICD-10-CM | POA: Diagnosis not present

## 2021-07-26 DIAGNOSIS — L98419 Non-pressure chronic ulcer of buttock with unspecified severity: Secondary | ICD-10-CM | POA: Diagnosis not present

## 2021-07-26 DIAGNOSIS — I491 Atrial premature depolarization: Secondary | ICD-10-CM | POA: Diagnosis not present

## 2021-07-26 DIAGNOSIS — Z8719 Personal history of other diseases of the digestive system: Secondary | ICD-10-CM | POA: Diagnosis not present

## 2021-07-26 DIAGNOSIS — N281 Cyst of kidney, acquired: Secondary | ICD-10-CM | POA: Diagnosis not present

## 2021-07-27 ENCOUNTER — Telehealth: Payer: Self-pay

## 2021-07-27 ENCOUNTER — Telehealth: Payer: Self-pay | Admitting: Neurology

## 2021-07-27 ENCOUNTER — Other Ambulatory Visit: Payer: Self-pay

## 2021-07-27 DIAGNOSIS — R339 Retention of urine, unspecified: Secondary | ICD-10-CM | POA: Diagnosis not present

## 2021-07-27 DIAGNOSIS — L89629 Pressure ulcer of left heel, unspecified stage: Secondary | ICD-10-CM | POA: Diagnosis not present

## 2021-07-27 DIAGNOSIS — I639 Cerebral infarction, unspecified: Secondary | ICD-10-CM

## 2021-07-27 DIAGNOSIS — I69252 Hemiplegia and hemiparesis following other nontraumatic intracranial hemorrhage affecting left dominant side: Secondary | ICD-10-CM | POA: Diagnosis not present

## 2021-07-27 DIAGNOSIS — Z993 Dependence on wheelchair: Secondary | ICD-10-CM | POA: Diagnosis not present

## 2021-07-27 DIAGNOSIS — R1312 Dysphagia, oropharyngeal phase: Secondary | ICD-10-CM | POA: Diagnosis not present

## 2021-07-27 DIAGNOSIS — E785 Hyperlipidemia, unspecified: Secondary | ICD-10-CM | POA: Diagnosis not present

## 2021-07-27 DIAGNOSIS — N281 Cyst of kidney, acquired: Secondary | ICD-10-CM | POA: Diagnosis not present

## 2021-07-27 DIAGNOSIS — L98419 Non-pressure chronic ulcer of buttock with unspecified severity: Secondary | ICD-10-CM | POA: Diagnosis not present

## 2021-07-27 DIAGNOSIS — L89619 Pressure ulcer of right heel, unspecified stage: Secondary | ICD-10-CM | POA: Diagnosis not present

## 2021-07-27 DIAGNOSIS — I10 Essential (primary) hypertension: Secondary | ICD-10-CM | POA: Diagnosis not present

## 2021-07-27 DIAGNOSIS — K219 Gastro-esophageal reflux disease without esophagitis: Secondary | ICD-10-CM | POA: Diagnosis not present

## 2021-07-27 DIAGNOSIS — D62 Acute posthemorrhagic anemia: Secondary | ICD-10-CM | POA: Diagnosis not present

## 2021-07-27 NOTE — Telephone Encounter (Signed)
Pt wife called in today and wanted to make sure that Dr Delice Lesch knew she had sent a MyChart message to her on 07-25-21 she has nto heard back from anyone. Please call patient wife at [phone] she will not be able to answer the phone between 2-3 today

## 2021-07-27 NOTE — Telephone Encounter (Signed)
Spoke with pt wife informed her that we have talked to [facility] at the facility and that we have changed the CT to med center and that we will call her when we get it scheduled,

## 2021-07-27 NOTE — Telephone Encounter (Signed)
I was following up on Ct head wo contrast cpt 29937, patient is currently in hospital at [facility]. Does this need to be done still? Please advise.

## 2021-07-27 NOTE — Telephone Encounter (Signed)
Records reviewed, he is in the hospital for GI bleed. I don't see a head CT being done inpatient. Can wait for now and see what they do while he is admitted. Thanks

## 2021-07-28 DIAGNOSIS — R319 Hematuria, unspecified: Secondary | ICD-10-CM | POA: Diagnosis not present

## 2021-07-28 DIAGNOSIS — I619 Nontraumatic intracerebral hemorrhage, unspecified: Secondary | ICD-10-CM | POA: Diagnosis not present

## 2021-07-28 DIAGNOSIS — E119 Type 2 diabetes mellitus without complications: Secondary | ICD-10-CM | POA: Diagnosis not present

## 2021-07-28 DIAGNOSIS — I10 Essential (primary) hypertension: Secondary | ICD-10-CM | POA: Diagnosis not present

## 2021-07-28 DIAGNOSIS — Z743 Need for continuous supervision: Secondary | ICD-10-CM | POA: Diagnosis not present

## 2021-07-28 DIAGNOSIS — R58 Hemorrhage, not elsewhere classified: Secondary | ICD-10-CM | POA: Diagnosis not present

## 2021-07-28 DIAGNOSIS — I5030 Unspecified diastolic (congestive) heart failure: Secondary | ICD-10-CM | POA: Diagnosis not present

## 2021-07-28 DIAGNOSIS — I69991 Dysphagia following unspecified cerebrovascular disease: Secondary | ICD-10-CM | POA: Diagnosis not present

## 2021-07-28 DIAGNOSIS — M1990 Unspecified osteoarthritis, unspecified site: Secondary | ICD-10-CM | POA: Diagnosis not present

## 2021-07-28 DIAGNOSIS — I739 Peripheral vascular disease, unspecified: Secondary | ICD-10-CM | POA: Diagnosis not present

## 2021-07-28 DIAGNOSIS — R5381 Other malaise: Secondary | ICD-10-CM | POA: Diagnosis not present

## 2021-07-28 DIAGNOSIS — D509 Iron deficiency anemia, unspecified: Secondary | ICD-10-CM | POA: Diagnosis not present

## 2021-07-28 DIAGNOSIS — K625 Hemorrhage of anus and rectum: Secondary | ICD-10-CM | POA: Diagnosis not present

## 2021-07-28 DIAGNOSIS — R410 Disorientation, unspecified: Secondary | ICD-10-CM | POA: Diagnosis not present

## 2021-07-28 DIAGNOSIS — I69354 Hemiplegia and hemiparesis following cerebral infarction affecting left non-dominant side: Secondary | ICD-10-CM | POA: Diagnosis not present

## 2021-08-01 ENCOUNTER — Ambulatory Visit: Payer: Medicare Other | Admitting: Rehabilitative and Restorative Service Providers"

## 2021-08-02 ENCOUNTER — Encounter (HOSPITAL_COMMUNITY): Payer: Medicare Other

## 2021-08-03 ENCOUNTER — Other Ambulatory Visit (HOSPITAL_BASED_OUTPATIENT_CLINIC_OR_DEPARTMENT_OTHER): Payer: Medicare Other

## 2021-08-07 DIAGNOSIS — M1990 Unspecified osteoarthritis, unspecified site: Secondary | ICD-10-CM | POA: Diagnosis not present

## 2021-08-07 DIAGNOSIS — I69991 Dysphagia following unspecified cerebrovascular disease: Secondary | ICD-10-CM | POA: Diagnosis not present

## 2021-08-07 DIAGNOSIS — D509 Iron deficiency anemia, unspecified: Secondary | ICD-10-CM | POA: Diagnosis not present

## 2021-08-08 DIAGNOSIS — I619 Nontraumatic intracerebral hemorrhage, unspecified: Secondary | ICD-10-CM | POA: Diagnosis not present

## 2021-08-11 ENCOUNTER — Other Ambulatory Visit: Payer: Self-pay

## 2021-08-11 ENCOUNTER — Encounter (HOSPITAL_COMMUNITY): Payer: Self-pay

## 2021-08-11 ENCOUNTER — Ambulatory Visit (HOSPITAL_BASED_OUTPATIENT_CLINIC_OR_DEPARTMENT_OTHER)
Admission: RE | Admit: 2021-08-11 | Discharge: 2021-08-11 | Disposition: A | Discharge status: Home / Self Care | Payer: Medicare Other | Source: Ambulatory Visit | Attending: Neurology | Admitting: Neurology

## 2021-08-11 DIAGNOSIS — I639 Cerebral infarction, unspecified: Secondary | ICD-10-CM | POA: Insufficient documentation

## 2021-08-11 DIAGNOSIS — Z8673 Personal history of transient ischemic attack (TIA), and cerebral infarction without residual deficits: Secondary | ICD-10-CM | POA: Diagnosis not present

## 2021-08-11 DIAGNOSIS — G9389 Other specified disorders of brain: Secondary | ICD-10-CM | POA: Diagnosis not present

## 2021-08-14 ENCOUNTER — Ambulatory Visit: Payer: Medicare Other | Admitting: Neurology

## 2021-08-16 ENCOUNTER — Telehealth: Payer: Self-pay | Admitting: Physician Assistant

## 2021-08-16 DIAGNOSIS — I69154 Hemiplegia and hemiparesis following nontraumatic intracerebral hemorrhage affecting left non-dominant side: Secondary | ICD-10-CM | POA: Diagnosis not present

## 2021-08-16 DIAGNOSIS — M62442 Contracture of muscle, left hand: Secondary | ICD-10-CM | POA: Diagnosis not present

## 2021-08-16 DIAGNOSIS — I639 Cerebral infarction, unspecified: Secondary | ICD-10-CM

## 2021-08-16 DIAGNOSIS — R293 Abnormal posture: Secondary | ICD-10-CM | POA: Diagnosis not present

## 2021-08-16 MED ORDER — VENLAFAXINE HCL 37.5 MG PO TABS: ORAL_TABLET | ORAL | 11 refills | Status: AC

## 2021-08-16 NOTE — Telephone Encounter (Signed)
Spoke to wife, he is sleeping even more now and again. Memory is getting worse, sometimes does not recognize wife. Discussed head CT which showed resolution of head CT. Unable to do physical therapy, complaining of pain but cannot determine where it is, screams in apparent excruciating pain. He is not on any sedating medications, no clear explanation of excessive sleepiness. Would do MRI brain with and without contrast. Try starting venlafaxine 37.5mg  daily by G-tube for depression and pain. Side effects discussed. Referral was supposed to be for Physical Medicine and Rehab (ie Dr. Letta Pate), but it was sent to physical therapy.  Heather, pls order MRI brain with and without contrast (dx: altered mental status) Pls send order to Ambulatory Referral to Physical Medicine and Rehab for post-stroke care (this is to see a physician, not physical therapist) Pls fax order for venlafaxine to his SNF  Thanks!

## 2021-08-16 NOTE — Telephone Encounter (Signed)
Patient called again for results.

## 2021-08-16 NOTE — Telephone Encounter (Signed)
Patient's wife called again for these results.

## 2021-08-17 DIAGNOSIS — I69154 Hemiplegia and hemiparesis following nontraumatic intracerebral hemorrhage affecting left non-dominant side: Secondary | ICD-10-CM | POA: Diagnosis not present

## 2021-08-17 DIAGNOSIS — R293 Abnormal posture: Secondary | ICD-10-CM | POA: Diagnosis not present

## 2021-08-17 DIAGNOSIS — M62442 Contracture of muscle, left hand: Secondary | ICD-10-CM | POA: Diagnosis not present

## 2021-08-17 NOTE — Addendum Note (Signed)
Addended by: Jake Seats on: 08/17/2021 09:26 AM   Modules accepted: Orders

## 2021-08-19 DIAGNOSIS — R293 Abnormal posture: Secondary | ICD-10-CM | POA: Diagnosis not present

## 2021-08-19 DIAGNOSIS — I69154 Hemiplegia and hemiparesis following nontraumatic intracerebral hemorrhage affecting left non-dominant side: Secondary | ICD-10-CM | POA: Diagnosis not present

## 2021-08-19 DIAGNOSIS — M62442 Contracture of muscle, left hand: Secondary | ICD-10-CM | POA: Diagnosis not present

## 2021-08-21 DIAGNOSIS — R293 Abnormal posture: Secondary | ICD-10-CM | POA: Diagnosis not present

## 2021-08-21 DIAGNOSIS — I69154 Hemiplegia and hemiparesis following nontraumatic intracerebral hemorrhage affecting left non-dominant side: Secondary | ICD-10-CM | POA: Diagnosis not present

## 2021-08-21 DIAGNOSIS — M62442 Contracture of muscle, left hand: Secondary | ICD-10-CM | POA: Diagnosis not present

## 2021-08-22 DIAGNOSIS — D638 Anemia in other chronic diseases classified elsewhere: Secondary | ICD-10-CM | POA: Diagnosis not present

## 2021-08-22 DIAGNOSIS — M62442 Contracture of muscle, left hand: Secondary | ICD-10-CM | POA: Diagnosis not present

## 2021-08-22 DIAGNOSIS — I69154 Hemiplegia and hemiparesis following nontraumatic intracerebral hemorrhage affecting left non-dominant side: Secondary | ICD-10-CM | POA: Diagnosis not present

## 2021-08-22 DIAGNOSIS — Z79899 Other long term (current) drug therapy: Secondary | ICD-10-CM | POA: Diagnosis not present

## 2021-08-22 DIAGNOSIS — I10 Essential (primary) hypertension: Secondary | ICD-10-CM | POA: Diagnosis not present

## 2021-08-22 DIAGNOSIS — E785 Hyperlipidemia, unspecified: Secondary | ICD-10-CM | POA: Diagnosis not present

## 2021-08-22 DIAGNOSIS — E1122 Type 2 diabetes mellitus with diabetic chronic kidney disease: Secondary | ICD-10-CM | POA: Diagnosis not present

## 2021-08-22 DIAGNOSIS — R293 Abnormal posture: Secondary | ICD-10-CM | POA: Diagnosis not present

## 2021-08-23 DIAGNOSIS — R293 Abnormal posture: Secondary | ICD-10-CM | POA: Diagnosis not present

## 2021-08-23 DIAGNOSIS — M62442 Contracture of muscle, left hand: Secondary | ICD-10-CM | POA: Diagnosis not present

## 2021-08-23 DIAGNOSIS — I69154 Hemiplegia and hemiparesis following nontraumatic intracerebral hemorrhage affecting left non-dominant side: Secondary | ICD-10-CM | POA: Diagnosis not present

## 2021-08-24 DIAGNOSIS — R293 Abnormal posture: Secondary | ICD-10-CM | POA: Diagnosis not present

## 2021-08-24 DIAGNOSIS — M62442 Contracture of muscle, left hand: Secondary | ICD-10-CM | POA: Diagnosis not present

## 2021-08-24 DIAGNOSIS — I69154 Hemiplegia and hemiparesis following nontraumatic intracerebral hemorrhage affecting left non-dominant side: Secondary | ICD-10-CM | POA: Diagnosis not present

## 2021-08-25 ENCOUNTER — Ambulatory Visit: Payer: Medicare Other | Admitting: Neurology

## 2021-08-25 DIAGNOSIS — M62442 Contracture of muscle, left hand: Secondary | ICD-10-CM | POA: Diagnosis not present

## 2021-08-25 DIAGNOSIS — R293 Abnormal posture: Secondary | ICD-10-CM | POA: Diagnosis not present

## 2021-08-25 DIAGNOSIS — D509 Iron deficiency anemia, unspecified: Secondary | ICD-10-CM | POA: Diagnosis not present

## 2021-08-25 DIAGNOSIS — I69154 Hemiplegia and hemiparesis following nontraumatic intracerebral hemorrhage affecting left non-dominant side: Secondary | ICD-10-CM | POA: Diagnosis not present

## 2021-08-25 DIAGNOSIS — N183 Chronic kidney disease, stage 3 unspecified: Secondary | ICD-10-CM | POA: Diagnosis not present

## 2021-08-26 ENCOUNTER — Other Ambulatory Visit: Payer: Self-pay

## 2021-08-26 ENCOUNTER — Encounter (HOSPITAL_BASED_OUTPATIENT_CLINIC_OR_DEPARTMENT_OTHER): Payer: Self-pay

## 2021-08-26 ENCOUNTER — Ambulatory Visit (HOSPITAL_BASED_OUTPATIENT_CLINIC_OR_DEPARTMENT_OTHER)
Admission: RE | Admit: 2021-08-26 | Discharge: 2021-08-26 | Disposition: A | Discharge status: Home / Self Care | Payer: Medicare Other | Source: Ambulatory Visit | Attending: Neurology | Admitting: Neurology

## 2021-08-26 DIAGNOSIS — I639 Cerebral infarction, unspecified: Secondary | ICD-10-CM

## 2021-08-28 ENCOUNTER — Encounter: Payer: Self-pay | Admitting: Physical Medicine & Rehabilitation

## 2021-08-28 ENCOUNTER — Telehealth: Payer: Medicare Other

## 2021-08-28 DIAGNOSIS — I69154 Hemiplegia and hemiparesis following nontraumatic intracerebral hemorrhage affecting left non-dominant side: Secondary | ICD-10-CM | POA: Diagnosis not present

## 2021-08-28 DIAGNOSIS — R293 Abnormal posture: Secondary | ICD-10-CM | POA: Diagnosis not present

## 2021-08-28 DIAGNOSIS — M62442 Contracture of muscle, left hand: Secondary | ICD-10-CM | POA: Diagnosis not present

## 2021-08-29 DIAGNOSIS — R293 Abnormal posture: Secondary | ICD-10-CM | POA: Diagnosis not present

## 2021-08-29 DIAGNOSIS — N189 Chronic kidney disease, unspecified: Secondary | ICD-10-CM | POA: Diagnosis not present

## 2021-08-29 DIAGNOSIS — M62442 Contracture of muscle, left hand: Secondary | ICD-10-CM | POA: Diagnosis not present

## 2021-08-29 DIAGNOSIS — N184 Chronic kidney disease, stage 4 (severe): Secondary | ICD-10-CM | POA: Diagnosis not present

## 2021-08-29 DIAGNOSIS — I639 Cerebral infarction, unspecified: Secondary | ICD-10-CM | POA: Diagnosis not present

## 2021-08-29 DIAGNOSIS — D631 Anemia in chronic kidney disease: Secondary | ICD-10-CM | POA: Diagnosis not present

## 2021-08-29 DIAGNOSIS — I129 Hypertensive chronic kidney disease with stage 1 through stage 4 chronic kidney disease, or unspecified chronic kidney disease: Secondary | ICD-10-CM | POA: Diagnosis not present

## 2021-08-29 DIAGNOSIS — I69154 Hemiplegia and hemiparesis following nontraumatic intracerebral hemorrhage affecting left non-dominant side: Secondary | ICD-10-CM | POA: Diagnosis not present

## 2021-08-29 DIAGNOSIS — D351 Benign neoplasm of parathyroid gland: Secondary | ICD-10-CM | POA: Diagnosis not present

## 2021-08-30 DIAGNOSIS — M62442 Contracture of muscle, left hand: Secondary | ICD-10-CM | POA: Diagnosis not present

## 2021-08-30 DIAGNOSIS — I69354 Hemiplegia and hemiparesis following cerebral infarction affecting left non-dominant side: Secondary | ICD-10-CM | POA: Diagnosis not present

## 2021-08-30 DIAGNOSIS — R293 Abnormal posture: Secondary | ICD-10-CM | POA: Diagnosis not present

## 2021-08-30 DIAGNOSIS — I10 Essential (primary) hypertension: Secondary | ICD-10-CM | POA: Diagnosis not present

## 2021-08-30 DIAGNOSIS — I69991 Dysphagia following unspecified cerebrovascular disease: Secondary | ICD-10-CM | POA: Diagnosis not present

## 2021-08-30 DIAGNOSIS — D509 Iron deficiency anemia, unspecified: Secondary | ICD-10-CM | POA: Diagnosis not present

## 2021-08-30 DIAGNOSIS — I69154 Hemiplegia and hemiparesis following nontraumatic intracerebral hemorrhage affecting left non-dominant side: Secondary | ICD-10-CM | POA: Diagnosis not present

## 2021-08-30 DIAGNOSIS — N183 Chronic kidney disease, stage 3 unspecified: Secondary | ICD-10-CM | POA: Diagnosis not present

## 2021-08-30 DIAGNOSIS — R339 Retention of urine, unspecified: Secondary | ICD-10-CM | POA: Diagnosis not present

## 2021-08-31 DIAGNOSIS — M62442 Contracture of muscle, left hand: Secondary | ICD-10-CM | POA: Diagnosis not present

## 2021-08-31 DIAGNOSIS — R293 Abnormal posture: Secondary | ICD-10-CM | POA: Diagnosis not present

## 2021-08-31 DIAGNOSIS — I69154 Hemiplegia and hemiparesis following nontraumatic intracerebral hemorrhage affecting left non-dominant side: Secondary | ICD-10-CM | POA: Diagnosis not present

## 2021-09-01 DIAGNOSIS — M62442 Contracture of muscle, left hand: Secondary | ICD-10-CM | POA: Diagnosis not present

## 2021-09-01 DIAGNOSIS — I69154 Hemiplegia and hemiparesis following nontraumatic intracerebral hemorrhage affecting left non-dominant side: Secondary | ICD-10-CM | POA: Diagnosis not present

## 2021-09-01 DIAGNOSIS — R293 Abnormal posture: Secondary | ICD-10-CM | POA: Diagnosis not present

## 2021-09-02 ENCOUNTER — Encounter (HOSPITAL_BASED_OUTPATIENT_CLINIC_OR_DEPARTMENT_OTHER): Payer: Self-pay

## 2021-09-02 ENCOUNTER — Other Ambulatory Visit: Payer: Self-pay

## 2021-09-02 ENCOUNTER — Ambulatory Visit (HOSPITAL_BASED_OUTPATIENT_CLINIC_OR_DEPARTMENT_OTHER): Admission: RE | Admit: 2021-09-02 | Payer: Medicare Other | Source: Ambulatory Visit

## 2021-09-04 DIAGNOSIS — R293 Abnormal posture: Secondary | ICD-10-CM | POA: Diagnosis not present

## 2021-09-04 DIAGNOSIS — I69154 Hemiplegia and hemiparesis following nontraumatic intracerebral hemorrhage affecting left non-dominant side: Secondary | ICD-10-CM | POA: Diagnosis not present

## 2021-09-04 DIAGNOSIS — M62442 Contracture of muscle, left hand: Secondary | ICD-10-CM | POA: Diagnosis not present

## 2021-09-04 NOTE — Telephone Encounter (Signed)
Pt wife called to discuss a new location for her husband to get an MRI. She said the technician said there is a location in [place], they do facilities where he can take the MRI. Wife would like a call to discuss the time and location.

## 2021-09-04 NOTE — Telephone Encounter (Signed)
Pt wife called at both numbers no answer. Voice mail left to call the office back When she calls back please get a good number to call her back at.

## 2021-09-05 ENCOUNTER — Telehealth: Payer: Self-pay | Admitting: Physician Assistant

## 2021-09-05 DIAGNOSIS — I69154 Hemiplegia and hemiparesis following nontraumatic intracerebral hemorrhage affecting left non-dominant side: Secondary | ICD-10-CM | POA: Diagnosis not present

## 2021-09-05 DIAGNOSIS — M62442 Contracture of muscle, left hand: Secondary | ICD-10-CM | POA: Diagnosis not present

## 2021-09-05 DIAGNOSIS — R293 Abnormal posture: Secondary | ICD-10-CM | POA: Diagnosis not present

## 2021-09-05 NOTE — Telephone Encounter (Signed)
Pt wife called back no answer no voice mail picked up

## 2021-09-05 NOTE — Telephone Encounter (Signed)
Pt's wife called in and left a message with the Access Nurse returning Heather's call. Her call back number is [phone].

## 2021-09-05 NOTE — Telephone Encounter (Signed)
Pt wife is returning a call to office from yesterday.

## 2021-09-06 DIAGNOSIS — M62442 Contracture of muscle, left hand: Secondary | ICD-10-CM | POA: Diagnosis not present

## 2021-09-06 DIAGNOSIS — R293 Abnormal posture: Secondary | ICD-10-CM | POA: Diagnosis not present

## 2021-09-06 DIAGNOSIS — I69154 Hemiplegia and hemiparesis following nontraumatic intracerebral hemorrhage affecting left non-dominant side: Secondary | ICD-10-CM | POA: Diagnosis not present

## 2021-09-06 NOTE — Telephone Encounter (Signed)
Pt wife called left a message to leave a note where she would like her husband to have his MRI done.

## 2021-09-07 DIAGNOSIS — M62442 Contracture of muscle, left hand: Secondary | ICD-10-CM | POA: Diagnosis not present

## 2021-09-07 DIAGNOSIS — I69154 Hemiplegia and hemiparesis following nontraumatic intracerebral hemorrhage affecting left non-dominant side: Secondary | ICD-10-CM | POA: Diagnosis not present

## 2021-09-07 DIAGNOSIS — R293 Abnormal posture: Secondary | ICD-10-CM | POA: Diagnosis not present

## 2021-09-07 NOTE — Telephone Encounter (Signed)
duplicate

## 2021-09-08 DIAGNOSIS — M62442 Contracture of muscle, left hand: Secondary | ICD-10-CM | POA: Diagnosis not present

## 2021-09-08 DIAGNOSIS — R293 Abnormal posture: Secondary | ICD-10-CM | POA: Diagnosis not present

## 2021-09-08 DIAGNOSIS — I69154 Hemiplegia and hemiparesis following nontraumatic intracerebral hemorrhage affecting left non-dominant side: Secondary | ICD-10-CM | POA: Diagnosis not present

## 2021-09-11 DIAGNOSIS — M62442 Contracture of muscle, left hand: Secondary | ICD-10-CM | POA: Diagnosis not present

## 2021-09-11 DIAGNOSIS — I69154 Hemiplegia and hemiparesis following nontraumatic intracerebral hemorrhage affecting left non-dominant side: Secondary | ICD-10-CM | POA: Diagnosis not present

## 2021-09-11 DIAGNOSIS — R293 Abnormal posture: Secondary | ICD-10-CM | POA: Diagnosis not present

## 2021-09-30 ENCOUNTER — Encounter: Payer: Self-pay | Admitting: Family Medicine

## 2021-10-06 ENCOUNTER — Ambulatory Visit: Payer: Medicare Other | Admitting: Physician Assistant

## 2021-10-26 ENCOUNTER — Ambulatory Visit: Payer: Medicare Other | Admitting: Physical Medicine & Rehabilitation

## 2021-10-26 DEATH — deceased

## 2021-10-27 ENCOUNTER — Ambulatory Visit: Payer: Medicare Other | Admitting: Physical Medicine & Rehabilitation

## 2021-12-29 ENCOUNTER — Ambulatory Visit: Payer: Medicare Other | Admitting: Physician Assistant

## 2022-03-06 ENCOUNTER — Ambulatory Visit: Payer: Medicare Other

## 2022-09-03 IMAGING — MR MR MRA NECK W/O CM
1 series · 18 of 48 positions shown · IV contrast (gadavist)
Comparison: 05/05/2021, 04/30/2021 MRA neck

CLINICAL DATA: Intracranial hemorrhage, code stroke follow-up

EXAM:
MRI HEAD WITHOUT AND WITH CONTRAST
MRA HEAD WITHOUT CONTRAST
MRA NECK WITHOUT CONTRAST
TECHNIQUE: Multiplanar, multiecho pulse sequences of the brain and surrounding
structures were obtained without and with intravenous contrast.
Angiographic images of the Circle of Willis were obtained using MRA
technique without intravenous contrast. Angiographic images of the
neck were obtained using MRA technique without intravenous contrast.
Carotid stenosis measurements (when applicable) are obtained
utilizing NASCET criteria, using the distal internal carotid
diameter as the denominator.
CONTRAST:  7mL GADAVIST GADOBUTROL 1 MMOL/ML IV SOLN

[Series 11: TOF · axial · 2.4mm · 0.47mm/px · z∈[-233,-60]mm · 18 of 152 slices shown]
[im 1/152]
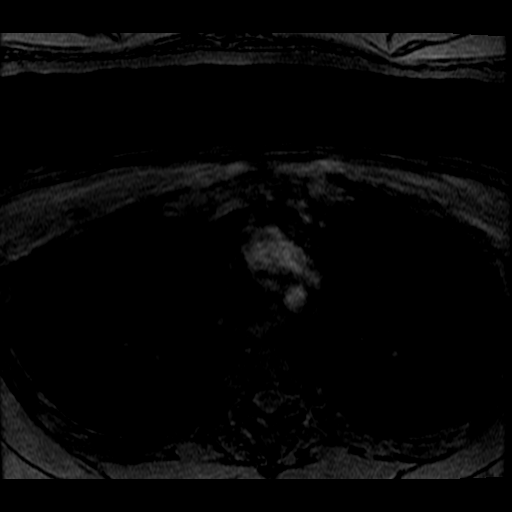
[im 4/152]
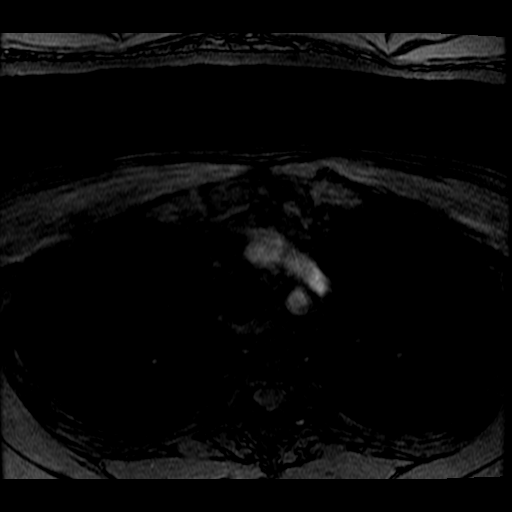
[im 7/152]
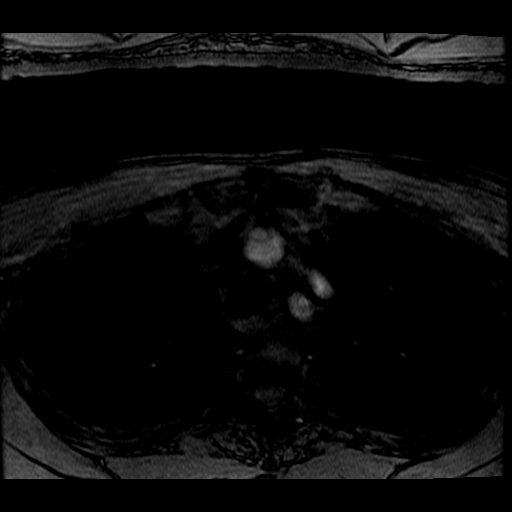
[im 10/152]
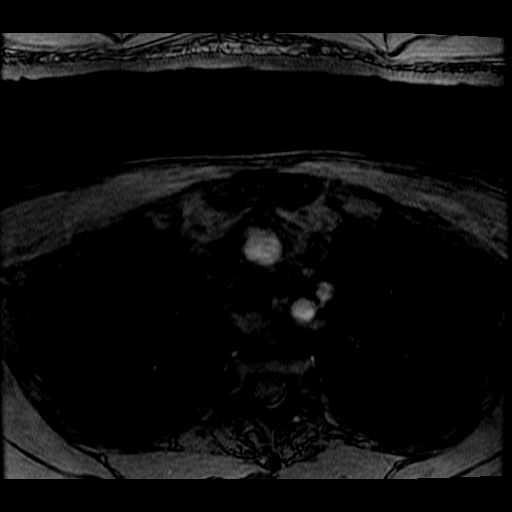
[im 13/152]
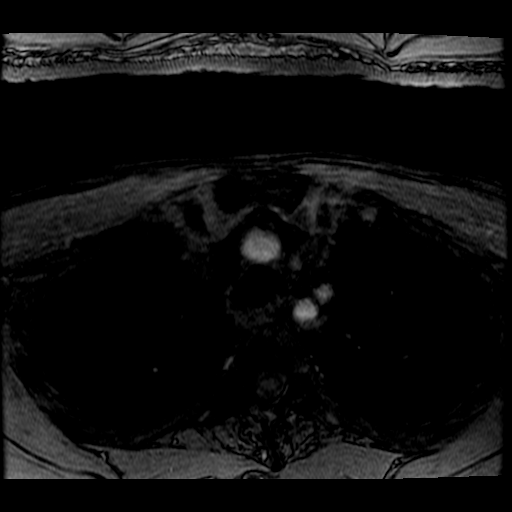
[im 17/152]
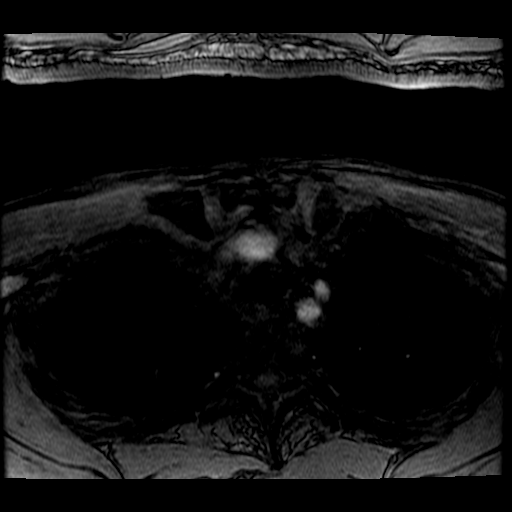
[im 20/152]
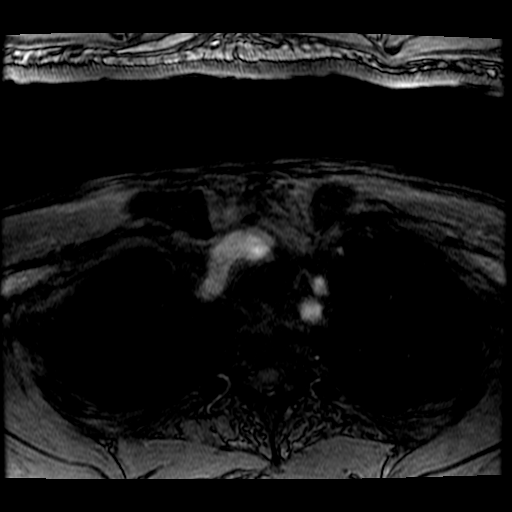
[im 23/152]
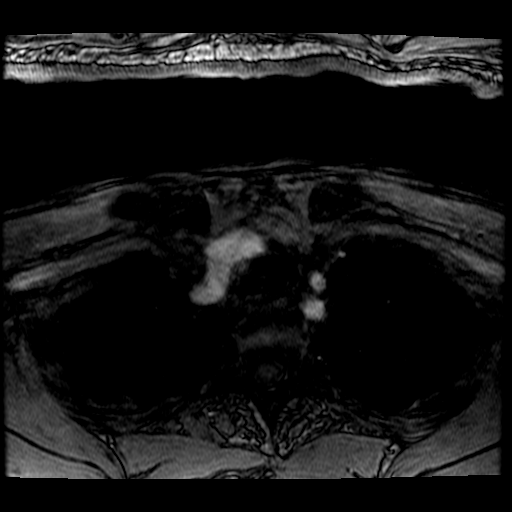
[im 26/152]
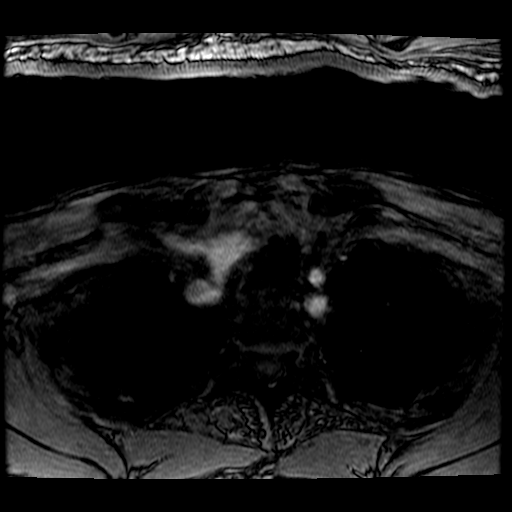
[im 29/152]
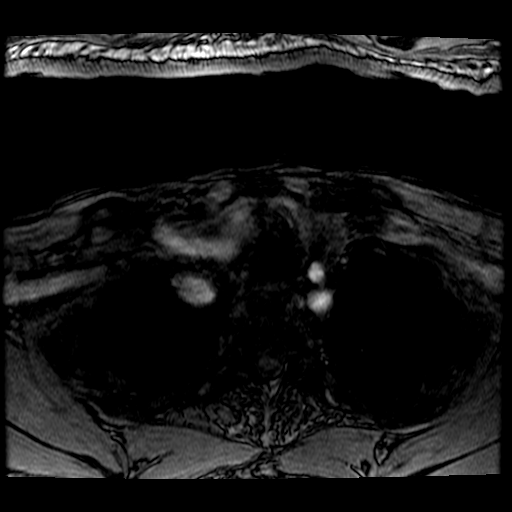
[im 49/152]
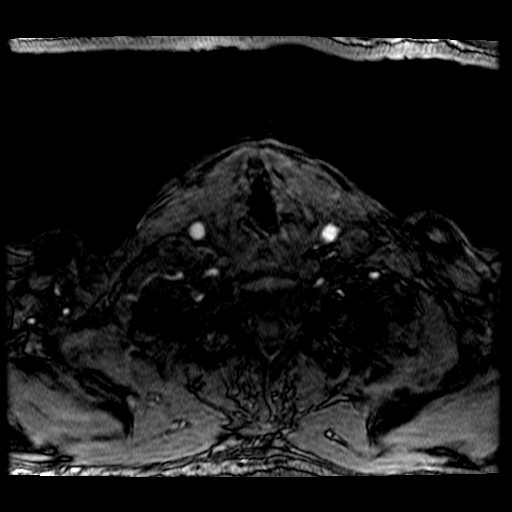
[im 68/152]
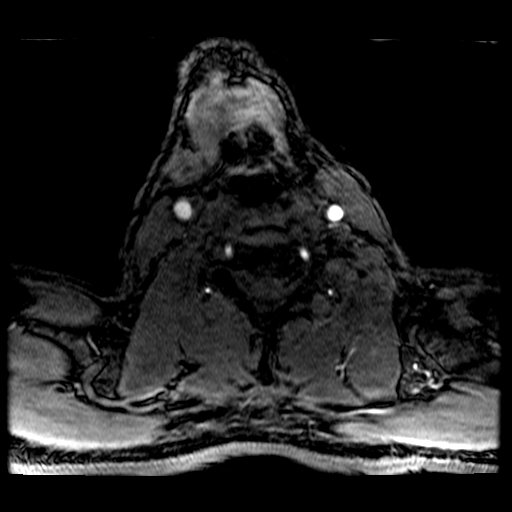
[im 78/152]
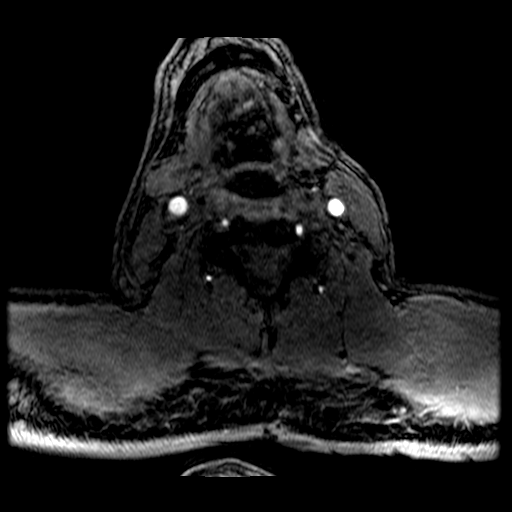
[im 87/152]
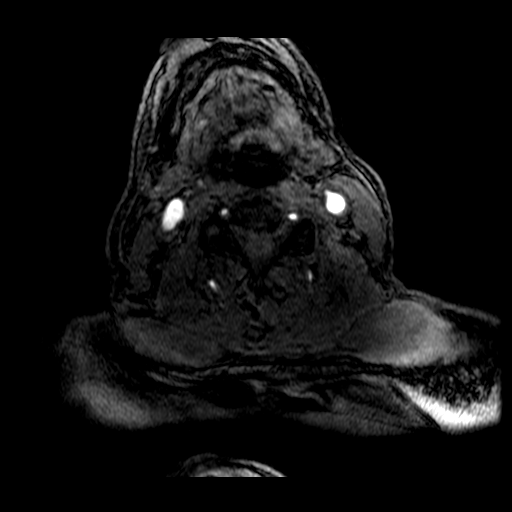
[im 107/152]
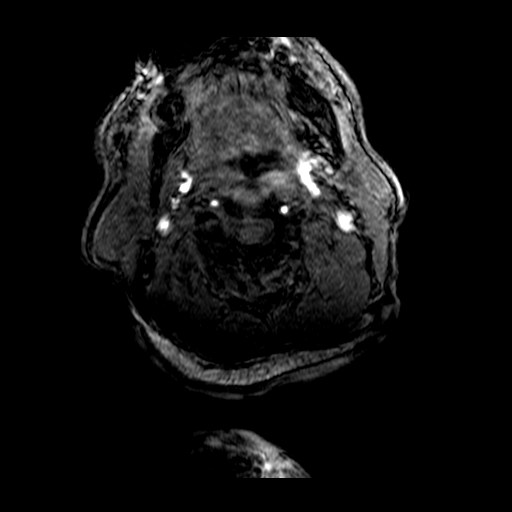
[im 126/152]
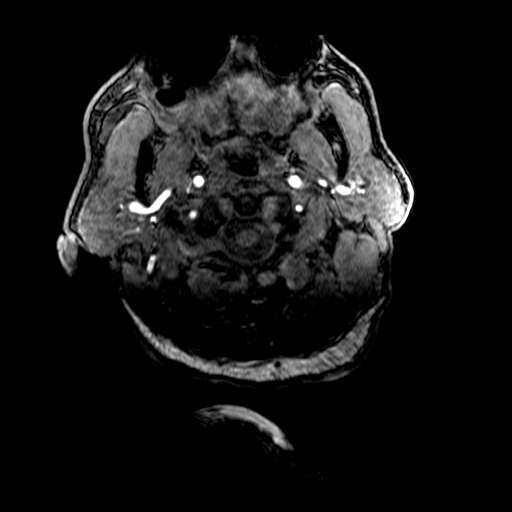
[im 129/152]
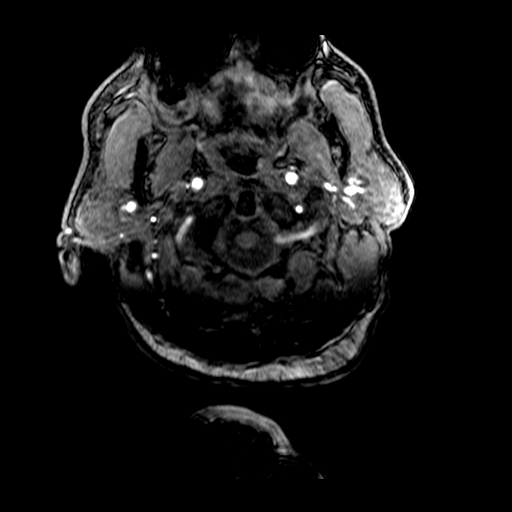
[im 145/152]
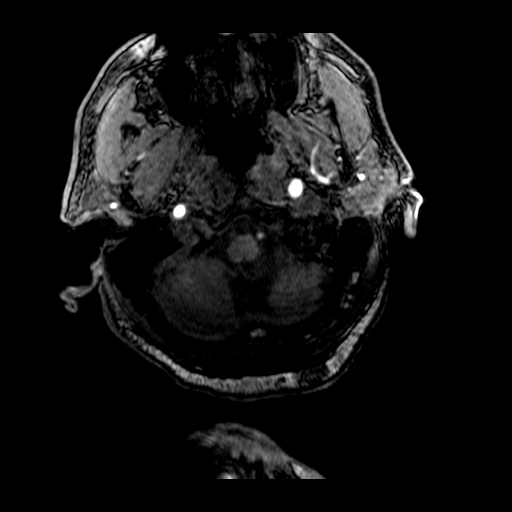

[18 of 48 positions shown; findings below may reference images not displayed]

FINDINGS: MRI HEAD

Brain: Acute right thalamic hemorrhage seen on recent CT imaging is
again identified with surrounding edema. Intraventricular extension
is again noted. There is effacement of the third ventricle. Similar
ventricle caliber with hydrocephalus. No abnormal enhancement to
suggest underlying lesion.

Two small foci of reduced diffusion in the inferior right parietal
lobe.

Stable findings of chronic microvascular ischemic changes. Multiple
chronic small vessel infarcts are again identified with involvement
of central cerebral white matter, deep gray nuclei, and pons.

Numerous foci of susceptibility hypointensity are also again noted
in the cerebral white matter, basal ganglia, thalami, and cerebellum
consistent with chronic microhemorrhages in a distribution
suggesting hypertension as the etiology.

There is no abnormal enhancement.

Vascular: Major vessel flow voids at the skull base are preserved.

Skull and upper cervical spine: Normal marrow signal is preserved.

Sinuses/Orbits: Minor mucosal thickening.  Orbits are unremarkable.

Other: Sella is unremarkable.  Mastoid air cells are clear.

MRA HEAD

Intracranial internal carotid arteries are patent. Middle and
anterior cerebral arteries are patent. Intracranial vertebral
arteries, basilar artery, posterior cerebral arteries are patent.
Bilateral posterior communicating arteries are present. There is no
significant stenosis or aneurysm.

MRA NECK

Common, internal, and external carotid arteries are patent.
Codominant vertebral arteries are patent. There is focal moderate
narrowing of the right V2 vertebral artery due to osteophytic
spurring. No hemodynamically significant stenosis.
IMPRESSION: Two small acute infarcts of the inferior right parietal lobe.

Acute right thalamic hemorrhage with intraventricular extension,
mild mass effect, and hydrocephalus similar to recent CT imaging.
Likely hypertensive in etiology with multiple prior chronic
microhemorrhage is noted.

Stable vascular imaging.

## 2022-09-03 IMAGING — MR MR MRA HEAD W/O CM
1 series · 18 of 48 positions shown · IV contrast (gadavist)
Comparison: 05/05/2021, 04/30/2021 MRA neck

CLINICAL DATA: Intracranial hemorrhage, code stroke follow-up

EXAM:
MRI HEAD WITHOUT AND WITH CONTRAST
MRA HEAD WITHOUT CONTRAST
MRA NECK WITHOUT CONTRAST
TECHNIQUE: Multiplanar, multiecho pulse sequences of the brain and surrounding
structures were obtained without and with intravenous contrast.
Angiographic images of the Circle of Willis were obtained using MRA
technique without intravenous contrast. Angiographic images of the
neck were obtained using MRA technique without intravenous contrast.
Carotid stenosis measurements (when applicable) are obtained
utilizing NASCET criteria, using the distal internal carotid
diameter as the denominator.
CONTRAST:  7mL GADAVIST GADOBUTROL 1 MMOL/ML IV SOLN

[Series 3: ax (id) · axial · 1.0mm · 0.43mm/px · z∈[-79,+3]mm · 18 of 176 slices shown]
[im 1/176]
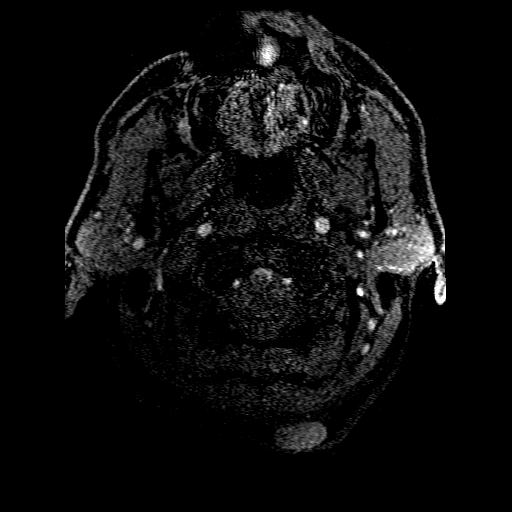
[im 4/176]
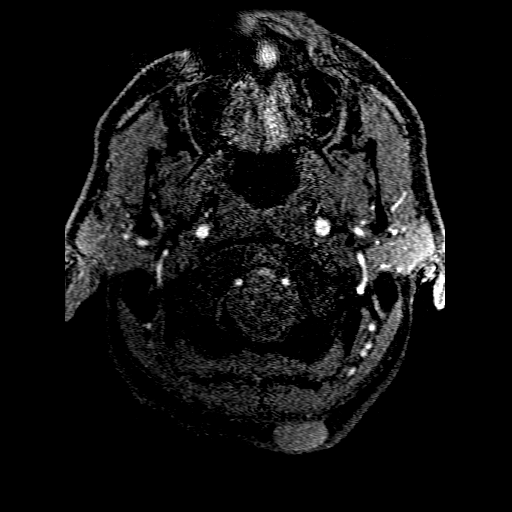
[im 8/176]
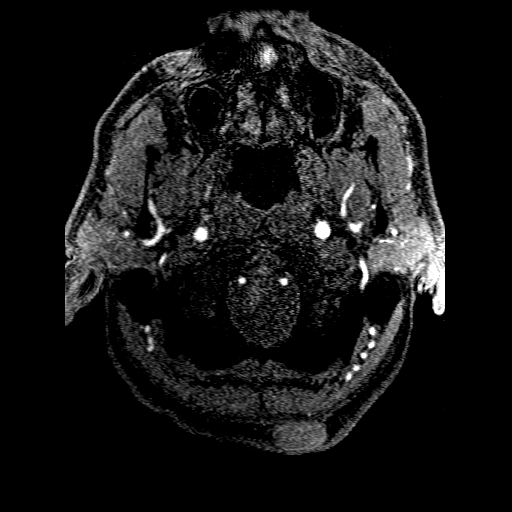
[im 12/176]
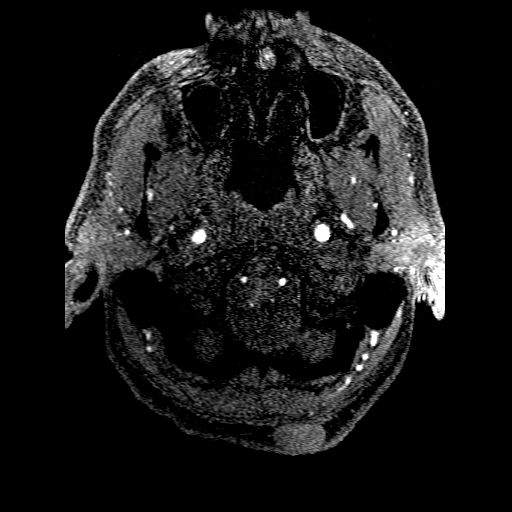
[im 15/176]
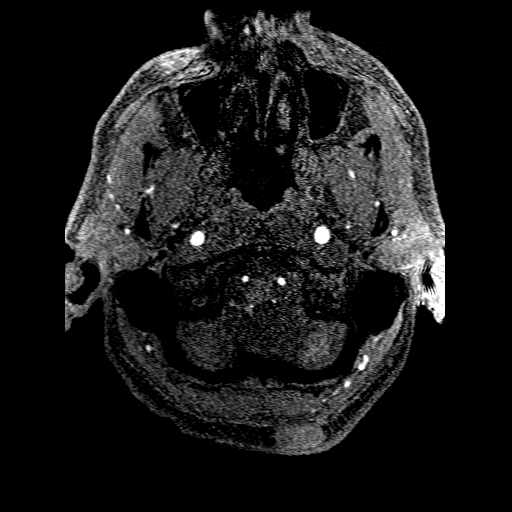
[im 19/176]
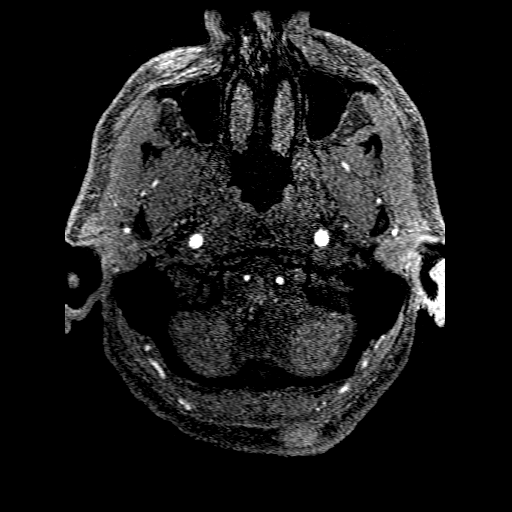
[im 23/176]
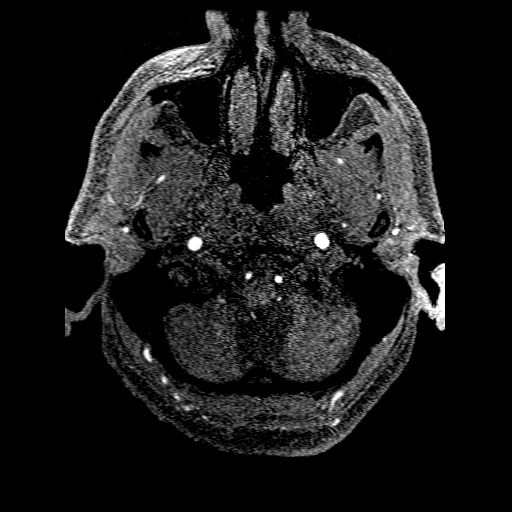
[im 27/176]
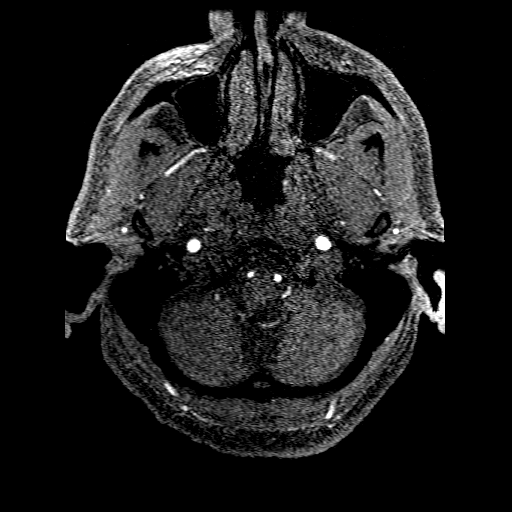
[im 30/176]
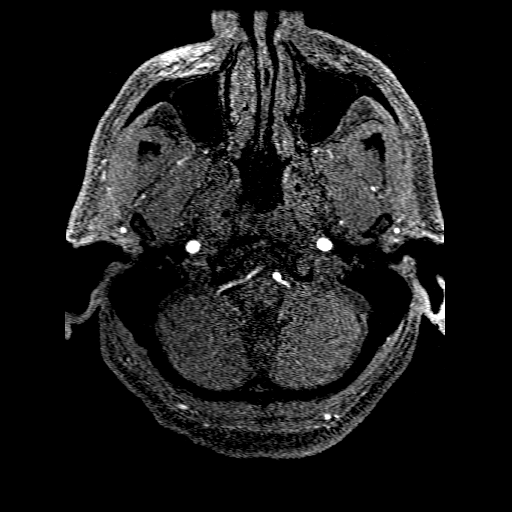
[im 34/176]
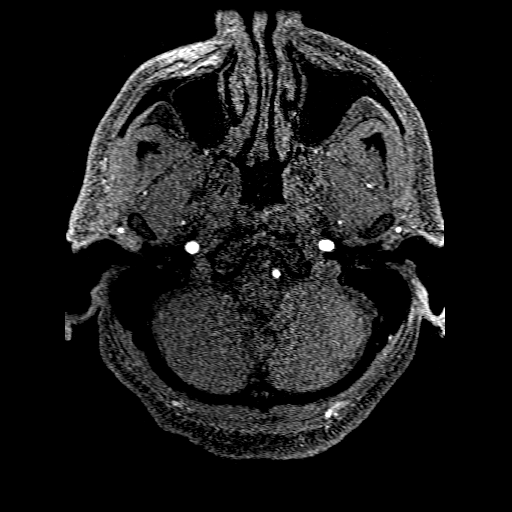
[im 56/176]
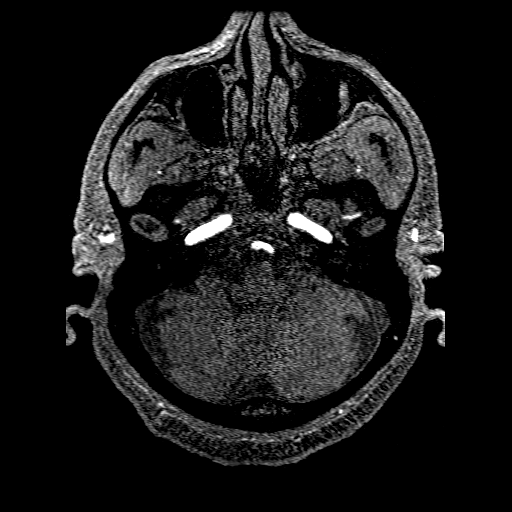
[im 79/176]
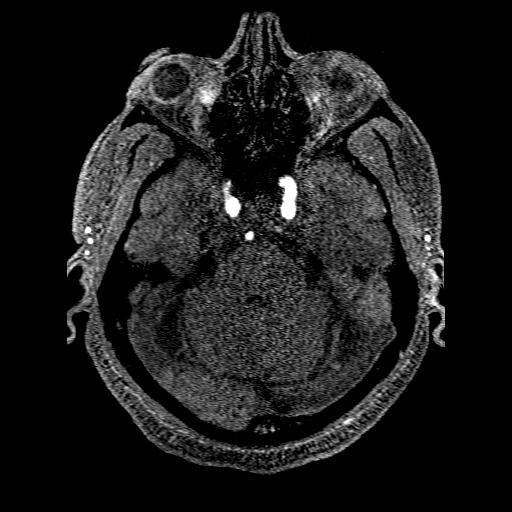
[im 90/176]
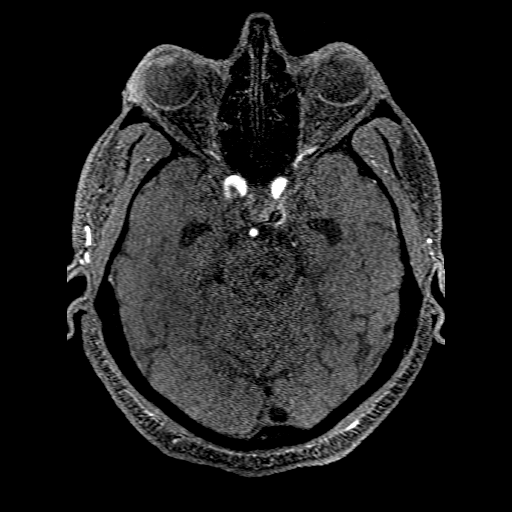
[im 101/176]
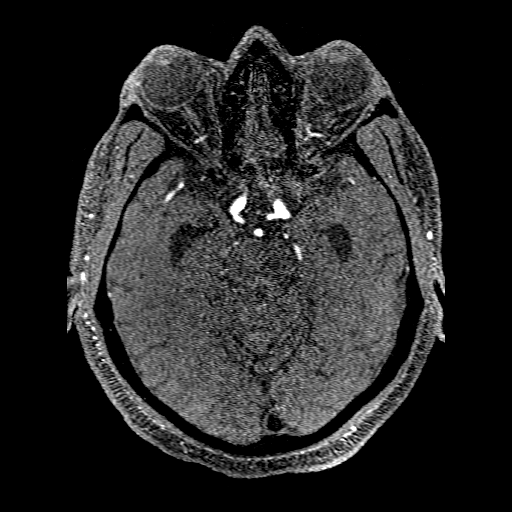
[im 123/176]
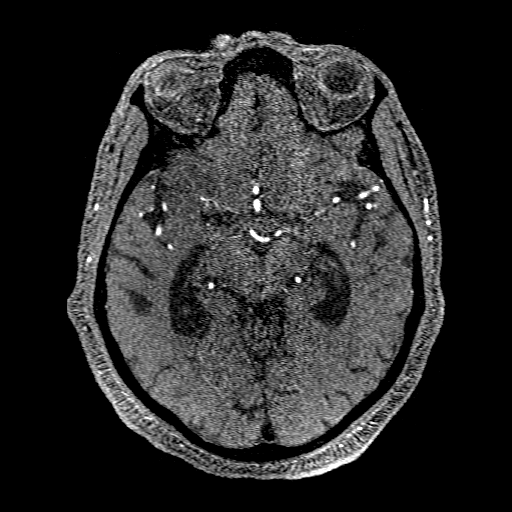
[im 146/176]
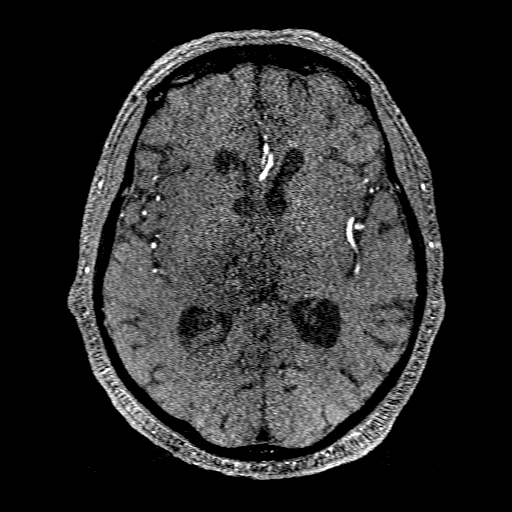
[im 149/176]
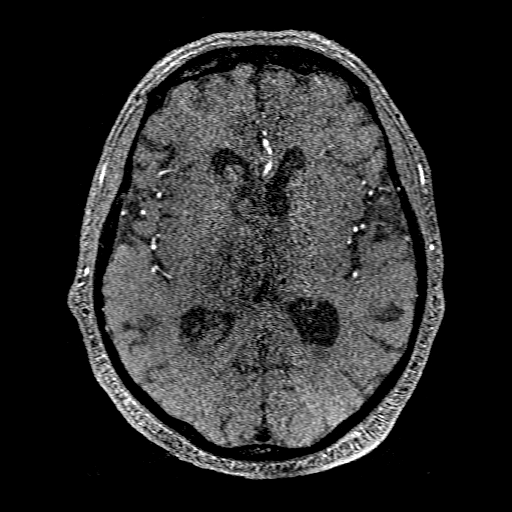
[im 168/176]
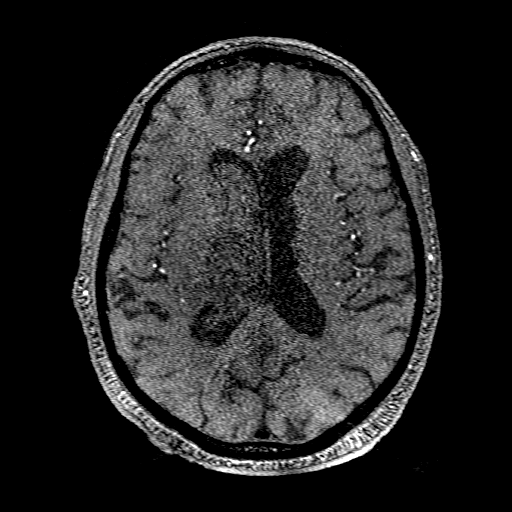

[18 of 48 positions shown; findings below may reference images not displayed]

FINDINGS: MRI HEAD

Brain: Acute right thalamic hemorrhage seen on recent CT imaging is
again identified with surrounding edema. Intraventricular extension
is again noted. There is effacement of the third ventricle. Similar
ventricle caliber with hydrocephalus. No abnormal enhancement to
suggest underlying lesion.

Two small foci of reduced diffusion in the inferior right parietal
lobe.

Stable findings of chronic microvascular ischemic changes. Multiple
chronic small vessel infarcts are again identified with involvement
of central cerebral white matter, deep gray nuclei, and pons.

Numerous foci of susceptibility hypointensity are also again noted
in the cerebral white matter, basal ganglia, thalami, and cerebellum
consistent with chronic microhemorrhages in a distribution
suggesting hypertension as the etiology.

There is no abnormal enhancement.

Vascular: Major vessel flow voids at the skull base are preserved.

Skull and upper cervical spine: Normal marrow signal is preserved.

Sinuses/Orbits: Minor mucosal thickening.  Orbits are unremarkable.

Other: Sella is unremarkable.  Mastoid air cells are clear.

MRA HEAD

Intracranial internal carotid arteries are patent. Middle and
anterior cerebral arteries are patent. Intracranial vertebral
arteries, basilar artery, posterior cerebral arteries are patent.
Bilateral posterior communicating arteries are present. There is no
significant stenosis or aneurysm.

MRA NECK

Common, internal, and external carotid arteries are patent.
Codominant vertebral arteries are patent. There is focal moderate
narrowing of the right V2 vertebral artery due to osteophytic
spurring. No hemodynamically significant stenosis.
IMPRESSION: Two small acute infarcts of the inferior right parietal lobe.

Acute right thalamic hemorrhage with intraventricular extension,
mild mass effect, and hydrocephalus similar to recent CT imaging.
Likely hypertensive in etiology with multiple prior chronic
microhemorrhage is noted.

Stable vascular imaging.
